# Patient Record
Sex: Male | Born: 1940
Health system: Southern US, Community
[De-identification: ages and names within clinical notes are randomized; demographics above are authoritative.]

## PROBLEM LIST (undated history)

## (undated) DIAGNOSIS — Z950 Presence of cardiac pacemaker: Secondary | ICD-10-CM

## (undated) DIAGNOSIS — T4145XA Adverse effect of unspecified anesthetic, initial encounter: Secondary | ICD-10-CM

## (undated) DIAGNOSIS — J40 Bronchitis, not specified as acute or chronic: Secondary | ICD-10-CM

## (undated) DIAGNOSIS — F32A Depression, unspecified: Secondary | ICD-10-CM

## (undated) DIAGNOSIS — T8859XA Other complications of anesthesia, initial encounter: Secondary | ICD-10-CM

## (undated) DIAGNOSIS — Z9889 Other specified postprocedural states: Secondary | ICD-10-CM

## (undated) DIAGNOSIS — F419 Anxiety disorder, unspecified: Secondary | ICD-10-CM

## (undated) DIAGNOSIS — E785 Hyperlipidemia, unspecified: Secondary | ICD-10-CM

## (undated) DIAGNOSIS — M1711 Unilateral primary osteoarthritis, right knee: Secondary | ICD-10-CM

## (undated) DIAGNOSIS — M719 Bursopathy, unspecified: Secondary | ICD-10-CM

## (undated) DIAGNOSIS — I10 Essential (primary) hypertension: Secondary | ICD-10-CM

## (undated) DIAGNOSIS — R011 Cardiac murmur, unspecified: Secondary | ICD-10-CM

## (undated) DIAGNOSIS — R112 Nausea with vomiting, unspecified: Secondary | ICD-10-CM

## (undated) DIAGNOSIS — I499 Cardiac arrhythmia, unspecified: Secondary | ICD-10-CM

## (undated) DIAGNOSIS — I480 Paroxysmal atrial fibrillation: Secondary | ICD-10-CM

## (undated) DIAGNOSIS — F329 Major depressive disorder, single episode, unspecified: Secondary | ICD-10-CM

## (undated) DIAGNOSIS — G43909 Migraine, unspecified, not intractable, without status migrainosus: Secondary | ICD-10-CM

## (undated) DIAGNOSIS — D649 Anemia, unspecified: Secondary | ICD-10-CM

## (undated) DIAGNOSIS — M1990 Unspecified osteoarthritis, unspecified site: Secondary | ICD-10-CM

## (undated) DIAGNOSIS — Z8489 Family history of other specified conditions: Secondary | ICD-10-CM

## (undated) HISTORY — PX: RHINOPLASTY: SUR1284

## (undated) HISTORY — DX: Unspecified osteoarthritis, unspecified site: M19.90

## (undated) HISTORY — PX: JOINT REPLACEMENT: SHX530

## (undated) HISTORY — DX: Essential (primary) hypertension: I10

## (undated) HISTORY — PX: OTHER SURGICAL HISTORY: SHX169

## (undated) HISTORY — DX: Migraine, unspecified, not intractable, without status migrainosus: G43.909

## (undated) HISTORY — PX: TONSILLECTOMY: SUR1361

## (undated) HISTORY — DX: Major depressive disorder, single episode, unspecified: F32.9

## (undated) HISTORY — DX: Bursopathy, unspecified: M71.9

## (undated) HISTORY — PX: TRANSTHORACIC ECHOCARDIOGRAM: SHX275

## (undated) HISTORY — DX: Depression, unspecified: F32.A

## (undated) HISTORY — PX: CHOLECYSTECTOMY: SHX55

## (undated) HISTORY — DX: Paroxysmal atrial fibrillation: I48.0

## (undated) HISTORY — DX: Bronchitis, not specified as acute or chronic: J40

## (undated) HISTORY — DX: Unilateral primary osteoarthritis, right knee: M17.11

## (undated) HISTORY — DX: Cardiac murmur, unspecified: R01.1

## (undated) HISTORY — DX: Hyperlipidemia, unspecified: E78.5

## (undated) HISTORY — PX: APPENDECTOMY: SHX54

---

## 2000-01-16 ENCOUNTER — Encounter: Payer: Self-pay | Admitting: General Surgery

## 2000-01-17 ENCOUNTER — Ambulatory Visit (HOSPITAL_COMMUNITY): Admission: RE | Admit: 2000-01-17 | Discharge: 2000-01-17 | Payer: Self-pay | Admitting: General Surgery

## 2000-05-05 ENCOUNTER — Ambulatory Visit (HOSPITAL_COMMUNITY): Admission: RE | Admit: 2000-05-05 | Discharge: 2000-05-05 | Payer: Self-pay | Admitting: Gastroenterology

## 2000-05-05 ENCOUNTER — Encounter (INDEPENDENT_AMBULATORY_CARE_PROVIDER_SITE_OTHER): Payer: Self-pay

## 2002-06-16 ENCOUNTER — Encounter: Admission: RE | Admit: 2002-06-16 | Discharge: 2002-09-14 | Payer: Self-pay | Admitting: Orthopedic Surgery

## 2002-10-17 ENCOUNTER — Ambulatory Visit (HOSPITAL_BASED_OUTPATIENT_CLINIC_OR_DEPARTMENT_OTHER): Admission: RE | Admit: 2002-10-17 | Discharge: 2002-10-17 | Payer: Self-pay | Admitting: *Deleted

## 2003-05-27 HISTORY — PX: CARDIAC CATHETERIZATION: SHX172

## 2003-07-17 ENCOUNTER — Encounter: Admission: RE | Admit: 2003-07-17 | Discharge: 2003-07-17 | Payer: Self-pay | Admitting: Gastroenterology

## 2003-08-28 ENCOUNTER — Observation Stay (HOSPITAL_COMMUNITY): Admission: RE | Admit: 2003-08-28 | Discharge: 2003-08-29 | Payer: Self-pay | Admitting: General Surgery

## 2003-08-28 ENCOUNTER — Encounter (INDEPENDENT_AMBULATORY_CARE_PROVIDER_SITE_OTHER): Payer: Self-pay | Admitting: Specialist

## 2004-03-20 ENCOUNTER — Inpatient Hospital Stay (HOSPITAL_BASED_OUTPATIENT_CLINIC_OR_DEPARTMENT_OTHER): Admission: RE | Admit: 2004-03-20 | Discharge: 2004-03-20 | Payer: Self-pay | Admitting: *Deleted

## 2004-03-28 ENCOUNTER — Ambulatory Visit: Payer: Self-pay | Admitting: Internal Medicine

## 2004-04-02 ENCOUNTER — Ambulatory Visit: Payer: Self-pay | Admitting: Cardiology

## 2004-04-16 ENCOUNTER — Ambulatory Visit: Payer: Self-pay | Admitting: Internal Medicine

## 2004-05-07 ENCOUNTER — Ambulatory Visit: Payer: Self-pay | Admitting: Cardiology

## 2004-05-07 ENCOUNTER — Ambulatory Visit: Payer: Self-pay | Admitting: Internal Medicine

## 2004-05-13 ENCOUNTER — Ambulatory Visit: Payer: Self-pay | Admitting: Cardiovascular Disease

## 2004-05-21 ENCOUNTER — Ambulatory Visit: Payer: Self-pay | Admitting: Internal Medicine

## 2004-05-24 ENCOUNTER — Ambulatory Visit: Payer: Self-pay | Admitting: Internal Medicine

## 2004-05-26 HISTORY — PX: OTHER SURGICAL HISTORY: SHX169

## 2004-06-14 ENCOUNTER — Ambulatory Visit: Payer: Self-pay | Admitting: Internal Medicine

## 2004-06-18 ENCOUNTER — Ambulatory Visit: Payer: Self-pay | Admitting: Internal Medicine

## 2004-06-21 ENCOUNTER — Ambulatory Visit: Payer: Self-pay | Admitting: Cardiology

## 2004-07-05 ENCOUNTER — Ambulatory Visit: Payer: Self-pay | Admitting: Cardiology

## 2004-07-08 ENCOUNTER — Ambulatory Visit: Payer: Self-pay | Admitting: Internal Medicine

## 2004-07-22 ENCOUNTER — Ambulatory Visit: Payer: Self-pay | Admitting: Internal Medicine

## 2004-08-12 ENCOUNTER — Ambulatory Visit: Payer: Self-pay | Admitting: *Deleted

## 2004-08-19 ENCOUNTER — Ambulatory Visit: Payer: Self-pay | Admitting: Cardiology

## 2004-08-26 ENCOUNTER — Ambulatory Visit: Payer: Self-pay | Admitting: Cardiology

## 2004-09-16 ENCOUNTER — Ambulatory Visit: Payer: Self-pay | Admitting: Cardiology

## 2004-10-14 ENCOUNTER — Ambulatory Visit: Payer: Self-pay | Admitting: Cardiology

## 2004-11-11 ENCOUNTER — Ambulatory Visit: Payer: Self-pay | Admitting: Cardiology

## 2004-12-09 ENCOUNTER — Ambulatory Visit: Payer: Self-pay | Admitting: Internal Medicine

## 2005-01-06 ENCOUNTER — Ambulatory Visit: Payer: Self-pay | Admitting: Cardiology

## 2005-02-03 ENCOUNTER — Ambulatory Visit: Payer: Self-pay | Admitting: Internal Medicine

## 2005-02-17 ENCOUNTER — Ambulatory Visit: Payer: Self-pay | Admitting: Internal Medicine

## 2005-03-14 ENCOUNTER — Ambulatory Visit: Payer: Self-pay | Admitting: Cardiovascular Disease

## 2005-04-12 ENCOUNTER — Encounter: Admission: RE | Admit: 2005-04-12 | Discharge: 2005-04-12 | Payer: Self-pay | Admitting: Orthopedic Surgery

## 2005-04-14 ENCOUNTER — Ambulatory Visit: Payer: Self-pay | Admitting: Cardiology

## 2005-04-30 ENCOUNTER — Ambulatory Visit: Payer: Self-pay | Admitting: Cardiology

## 2005-04-30 ENCOUNTER — Inpatient Hospital Stay (HOSPITAL_COMMUNITY): Admission: RE | Admit: 2005-04-30 | Discharge: 2005-05-01 | Payer: Self-pay | Admitting: Orthopedic Surgery

## 2005-05-02 ENCOUNTER — Emergency Department (HOSPITAL_COMMUNITY): Admission: EM | Admit: 2005-05-02 | Discharge: 2005-05-03 | Payer: Self-pay | Admitting: Emergency Medicine

## 2005-05-06 ENCOUNTER — Ambulatory Visit: Payer: Self-pay | Admitting: Internal Medicine

## 2005-05-06 ENCOUNTER — Ambulatory Visit: Payer: Self-pay | Admitting: Cardiology

## 2005-05-07 ENCOUNTER — Ambulatory Visit: Payer: Self-pay | Admitting: Internal Medicine

## 2005-05-13 ENCOUNTER — Ambulatory Visit: Payer: Self-pay

## 2005-05-13 ENCOUNTER — Ambulatory Visit: Payer: Self-pay | Admitting: Cardiovascular Disease

## 2005-05-13 ENCOUNTER — Ambulatory Visit: Payer: Self-pay | Admitting: Internal Medicine

## 2005-05-13 ENCOUNTER — Encounter: Payer: Self-pay | Admitting: Cardiology

## 2005-05-30 ENCOUNTER — Ambulatory Visit: Payer: Self-pay | Admitting: Internal Medicine

## 2005-05-30 ENCOUNTER — Ambulatory Visit: Payer: Self-pay | Admitting: Cardiology

## 2005-06-06 ENCOUNTER — Ambulatory Visit (HOSPITAL_COMMUNITY): Admission: RE | Admit: 2005-06-06 | Discharge: 2005-06-07 | Payer: Self-pay | Admitting: Internal Medicine

## 2005-06-06 ENCOUNTER — Ambulatory Visit: Payer: Self-pay | Admitting: Internal Medicine

## 2005-06-06 HISTORY — PX: PACEMAKER INSERTION: SHX728

## 2005-06-20 ENCOUNTER — Ambulatory Visit: Payer: Self-pay | Admitting: Cardiology

## 2005-06-23 ENCOUNTER — Ambulatory Visit: Payer: Self-pay

## 2005-06-30 ENCOUNTER — Ambulatory Visit: Payer: Self-pay | Admitting: Internal Medicine

## 2005-07-08 ENCOUNTER — Ambulatory Visit: Payer: Self-pay | Admitting: *Deleted

## 2005-07-29 ENCOUNTER — Ambulatory Visit: Payer: Self-pay | Admitting: *Deleted

## 2005-09-03 ENCOUNTER — Ambulatory Visit: Payer: Self-pay | Admitting: *Deleted

## 2005-09-17 ENCOUNTER — Ambulatory Visit: Payer: Self-pay | Admitting: *Deleted

## 2005-09-24 ENCOUNTER — Ambulatory Visit: Payer: Self-pay | Admitting: Internal Medicine

## 2005-10-01 ENCOUNTER — Ambulatory Visit: Payer: Self-pay | Admitting: Internal Medicine

## 2005-10-22 ENCOUNTER — Ambulatory Visit: Payer: Self-pay | Admitting: Internal Medicine

## 2005-11-10 ENCOUNTER — Ambulatory Visit: Payer: Self-pay | Admitting: Cardiology

## 2005-12-01 ENCOUNTER — Ambulatory Visit: Payer: Self-pay | Admitting: Internal Medicine

## 2005-12-29 ENCOUNTER — Ambulatory Visit: Payer: Self-pay | Admitting: Internal Medicine

## 2006-01-14 ENCOUNTER — Ambulatory Visit: Payer: Self-pay | Admitting: Cardiology

## 2006-01-28 ENCOUNTER — Ambulatory Visit: Payer: Self-pay | Admitting: *Deleted

## 2006-02-11 ENCOUNTER — Ambulatory Visit: Payer: Self-pay | Admitting: Cardiology

## 2006-02-25 ENCOUNTER — Ambulatory Visit: Payer: Self-pay | Admitting: Internal Medicine

## 2006-03-09 ENCOUNTER — Ambulatory Visit: Payer: Self-pay | Admitting: Cardiology

## 2006-04-01 ENCOUNTER — Ambulatory Visit: Payer: Self-pay | Admitting: Cardiology

## 2006-04-24 ENCOUNTER — Ambulatory Visit: Payer: Self-pay | Admitting: Cardiology

## 2006-05-22 ENCOUNTER — Ambulatory Visit: Payer: Self-pay | Admitting: Cardiology

## 2006-06-09 ENCOUNTER — Ambulatory Visit: Payer: Self-pay | Admitting: Internal Medicine

## 2006-06-19 ENCOUNTER — Ambulatory Visit: Payer: Self-pay | Admitting: Internal Medicine

## 2006-06-25 ENCOUNTER — Ambulatory Visit: Payer: Self-pay | Admitting: Cardiology

## 2006-07-06 ENCOUNTER — Ambulatory Visit: Payer: Self-pay | Admitting: Cardiology

## 2006-07-27 ENCOUNTER — Ambulatory Visit: Payer: Self-pay | Admitting: Cardiovascular Disease

## 2006-08-24 ENCOUNTER — Ambulatory Visit: Payer: Self-pay | Admitting: Cardiovascular Disease

## 2006-09-08 ENCOUNTER — Ambulatory Visit: Payer: Self-pay | Admitting: Cardiovascular Disease

## 2006-09-15 ENCOUNTER — Ambulatory Visit: Payer: Self-pay | Admitting: Internal Medicine

## 2006-09-18 ENCOUNTER — Encounter: Admission: RE | Admit: 2006-09-18 | Discharge: 2006-09-18 | Payer: Self-pay | Admitting: Orthopedic Surgery

## 2006-09-18 ENCOUNTER — Ambulatory Visit: Payer: Self-pay | Admitting: Cardiology

## 2006-09-28 ENCOUNTER — Ambulatory Visit: Payer: Self-pay | Admitting: Cardiovascular Disease

## 2006-10-27 ENCOUNTER — Ambulatory Visit: Payer: Self-pay | Admitting: Cardiology

## 2006-11-10 ENCOUNTER — Ambulatory Visit: Payer: Self-pay | Admitting: Cardiology

## 2006-12-08 ENCOUNTER — Ambulatory Visit: Payer: Self-pay | Admitting: Cardiovascular Disease

## 2006-12-11 ENCOUNTER — Ambulatory Visit: Payer: Self-pay | Admitting: Internal Medicine

## 2006-12-22 ENCOUNTER — Ambulatory Visit: Payer: Self-pay | Admitting: Cardiology

## 2007-01-11 ENCOUNTER — Ambulatory Visit: Payer: Self-pay | Admitting: Cardiology

## 2007-02-02 ENCOUNTER — Ambulatory Visit: Payer: Self-pay | Admitting: Internal Medicine

## 2007-02-08 ENCOUNTER — Ambulatory Visit: Payer: Self-pay | Admitting: Internal Medicine

## 2007-03-08 ENCOUNTER — Ambulatory Visit: Payer: Self-pay | Admitting: Internal Medicine

## 2007-03-25 ENCOUNTER — Ambulatory Visit: Payer: Self-pay | Admitting: Internal Medicine

## 2007-04-15 ENCOUNTER — Ambulatory Visit: Payer: Self-pay | Admitting: Cardiology

## 2007-05-24 ENCOUNTER — Ambulatory Visit: Payer: Self-pay | Admitting: Cardiology

## 2007-06-01 ENCOUNTER — Ambulatory Visit: Payer: Self-pay | Admitting: Cardiology

## 2007-06-02 ENCOUNTER — Encounter: Admission: RE | Admit: 2007-06-02 | Discharge: 2007-06-02 | Payer: Self-pay | Admitting: Orthopedic Surgery

## 2007-06-02 ENCOUNTER — Ambulatory Visit: Payer: Self-pay | Admitting: Cardiovascular Disease

## 2007-06-17 ENCOUNTER — Ambulatory Visit: Payer: Self-pay | Admitting: Cardiology

## 2007-06-21 ENCOUNTER — Ambulatory Visit: Payer: Self-pay | Admitting: Internal Medicine

## 2007-07-15 ENCOUNTER — Ambulatory Visit: Payer: Self-pay | Admitting: Cardiology

## 2007-07-29 ENCOUNTER — Ambulatory Visit: Payer: Self-pay | Admitting: Internal Medicine

## 2007-08-03 ENCOUNTER — Ambulatory Visit: Payer: Self-pay | Admitting: Cardiovascular Disease

## 2007-08-12 ENCOUNTER — Ambulatory Visit: Payer: Self-pay | Admitting: Cardiology

## 2007-09-09 ENCOUNTER — Ambulatory Visit: Payer: Self-pay | Admitting: Cardiology

## 2007-10-07 ENCOUNTER — Ambulatory Visit: Payer: Self-pay | Admitting: Internal Medicine

## 2007-11-04 ENCOUNTER — Ambulatory Visit: Payer: Self-pay | Admitting: Cardiology

## 2007-11-08 ENCOUNTER — Ambulatory Visit: Payer: Self-pay | Admitting: Internal Medicine

## 2007-11-18 ENCOUNTER — Ambulatory Visit: Payer: Self-pay | Admitting: Cardiology

## 2007-11-23 ENCOUNTER — Ambulatory Visit: Payer: Self-pay | Admitting: Internal Medicine

## 2007-11-23 LAB — CONVERTED CEMR LAB
Basophils Absolute: 0 10*3/uL (ref 0.0–0.1)
CO2: 31 meq/L (ref 19–32)
Chloride: 103 meq/L (ref 96–112)
Creatinine, Ser: 1 mg/dL (ref 0.4–1.5)
Eosinophils Absolute: 0.1 10*3/uL (ref 0.0–0.7)
GFR calc non Af Amer: 79 mL/min
Lymphocytes Relative: 18.1 % (ref 12.0–46.0)
MCHC: 35.1 g/dL (ref 30.0–36.0)
MCV: 93.2 fL (ref 78.0–100.0)
Magnesium: 2.6 mg/dL — ABNORMAL HIGH (ref 1.5–2.5)
Neutrophils Relative %: 72.3 % (ref 43.0–77.0)
Platelets: 265 10*3/uL (ref 150–400)
Potassium: 3.2 meq/L — ABNORMAL LOW (ref 3.5–5.1)
Prothrombin Time: 31.4 s — ABNORMAL HIGH (ref 10.9–13.3)
RBC: 4.64 M/uL (ref 4.22–5.81)
RDW: 13 % (ref 11.5–14.6)
Sodium: 141 meq/L (ref 135–145)
aPTT: 49.4 s — ABNORMAL HIGH (ref 21.7–29.8)

## 2007-11-24 ENCOUNTER — Inpatient Hospital Stay (HOSPITAL_COMMUNITY): Admission: AD | Admit: 2007-11-24 | Discharge: 2007-11-27 | Payer: Self-pay | Admitting: Internal Medicine

## 2007-11-24 ENCOUNTER — Ambulatory Visit: Payer: Self-pay | Admitting: Internal Medicine

## 2007-12-16 ENCOUNTER — Ambulatory Visit: Payer: Self-pay | Admitting: Internal Medicine

## 2007-12-31 ENCOUNTER — Ambulatory Visit: Payer: Self-pay | Admitting: Cardiology

## 2008-01-14 ENCOUNTER — Ambulatory Visit: Payer: Self-pay | Admitting: Internal Medicine

## 2008-02-04 ENCOUNTER — Ambulatory Visit: Payer: Self-pay | Admitting: Internal Medicine

## 2008-02-18 ENCOUNTER — Ambulatory Visit: Payer: Self-pay | Admitting: Internal Medicine

## 2008-03-17 ENCOUNTER — Ambulatory Visit: Payer: Self-pay | Admitting: Cardiology

## 2008-03-21 ENCOUNTER — Ambulatory Visit: Payer: Self-pay | Admitting: Internal Medicine

## 2008-04-05 ENCOUNTER — Ambulatory Visit: Payer: Self-pay | Admitting: Internal Medicine

## 2008-04-18 ENCOUNTER — Ambulatory Visit: Payer: Self-pay

## 2008-04-18 ENCOUNTER — Encounter: Payer: Self-pay | Admitting: Internal Medicine

## 2008-04-18 ENCOUNTER — Ambulatory Visit: Payer: Self-pay | Admitting: Cardiovascular Disease

## 2008-04-19 DIAGNOSIS — I4891 Unspecified atrial fibrillation: Secondary | ICD-10-CM | POA: Insufficient documentation

## 2008-04-19 DIAGNOSIS — I10 Essential (primary) hypertension: Secondary | ICD-10-CM | POA: Insufficient documentation

## 2008-04-24 ENCOUNTER — Ambulatory Visit: Payer: Self-pay | Admitting: Internal Medicine

## 2008-04-24 LAB — CONVERTED CEMR LAB
BUN: 16 mg/dL (ref 6–23)
Chloride: 106 meq/L (ref 96–112)
Eosinophils Absolute: 0.2 10*3/uL (ref 0.0–0.7)
Eosinophils Relative: 3.7 % (ref 0.0–5.0)
GFR calc Af Amer: 124 mL/min
GFR calc non Af Amer: 102 mL/min
HCT: 40.3 % (ref 39.0–52.0)
INR: 2.6 — ABNORMAL HIGH (ref 0.8–1.0)
MCV: 88.6 fL (ref 78.0–100.0)
Monocytes Absolute: 0.5 10*3/uL (ref 0.1–1.0)
Monocytes Relative: 9 % (ref 3.0–12.0)
Neutrophils Relative %: 55.6 % (ref 43.0–77.0)
Platelets: 187 10*3/uL (ref 150–400)
Potassium: 4.4 meq/L (ref 3.5–5.1)
RDW: 13.6 % (ref 11.5–14.6)
Sodium: 139 meq/L (ref 135–145)
TSH: 1.13 microintl units/mL (ref 0.35–5.50)
WBC: 5.1 10*3/uL (ref 4.5–10.5)

## 2008-04-25 HISTORY — PX: TRANSESOPHAGEAL ECHOCARDIOGRAM: SHX273

## 2008-05-01 ENCOUNTER — Encounter: Payer: Self-pay | Admitting: Internal Medicine

## 2008-05-01 ENCOUNTER — Ambulatory Visit: Payer: Self-pay | Admitting: Internal Medicine

## 2008-05-01 ENCOUNTER — Ambulatory Visit (HOSPITAL_COMMUNITY): Admission: RE | Admit: 2008-05-01 | Discharge: 2008-05-01 | Payer: Self-pay | Admitting: Internal Medicine

## 2008-05-02 ENCOUNTER — Ambulatory Visit: Payer: Self-pay | Admitting: Internal Medicine

## 2008-05-02 ENCOUNTER — Inpatient Hospital Stay (HOSPITAL_COMMUNITY): Admission: RE | Admit: 2008-05-02 | Discharge: 2008-05-03 | Payer: Self-pay | Admitting: Internal Medicine

## 2008-05-05 ENCOUNTER — Ambulatory Visit: Payer: Self-pay | Admitting: Cardiovascular Disease

## 2008-06-02 ENCOUNTER — Ambulatory Visit: Payer: Self-pay | Admitting: Cardiology

## 2008-06-10 ENCOUNTER — Emergency Department (HOSPITAL_COMMUNITY): Admission: EM | Admit: 2008-06-10 | Discharge: 2008-06-10 | Payer: Self-pay | Admitting: Emergency Medicine

## 2008-06-26 ENCOUNTER — Ambulatory Visit: Payer: Self-pay | Admitting: Internal Medicine

## 2008-06-26 ENCOUNTER — Ambulatory Visit: Payer: Self-pay | Admitting: Cardiology

## 2008-06-26 ENCOUNTER — Encounter (INDEPENDENT_AMBULATORY_CARE_PROVIDER_SITE_OTHER): Payer: Self-pay | Admitting: *Deleted

## 2008-07-06 ENCOUNTER — Encounter: Admission: RE | Admit: 2008-07-06 | Discharge: 2008-07-06 | Payer: Self-pay | Admitting: Neurosurgery

## 2008-07-17 ENCOUNTER — Ambulatory Visit: Payer: Self-pay | Admitting: Cardiology

## 2008-07-24 ENCOUNTER — Ambulatory Visit: Payer: Self-pay | Admitting: Cardiology

## 2008-08-14 ENCOUNTER — Ambulatory Visit: Payer: Self-pay | Admitting: Cardiology

## 2008-09-04 ENCOUNTER — Ambulatory Visit: Payer: Self-pay | Admitting: Internal Medicine

## 2008-09-25 ENCOUNTER — Ambulatory Visit: Payer: Self-pay | Admitting: Cardiology

## 2008-09-27 ENCOUNTER — Ambulatory Visit: Payer: Self-pay | Admitting: Internal Medicine

## 2008-10-09 ENCOUNTER — Telehealth: Payer: Self-pay | Admitting: Internal Medicine

## 2008-10-16 ENCOUNTER — Ambulatory Visit: Payer: Self-pay | Admitting: Cardiovascular Disease

## 2008-10-20 ENCOUNTER — Encounter: Payer: Self-pay | Admitting: Internal Medicine

## 2008-10-24 ENCOUNTER — Encounter: Payer: Self-pay | Admitting: *Deleted

## 2008-10-26 ENCOUNTER — Ambulatory Visit: Payer: Self-pay | Admitting: Internal Medicine

## 2008-10-26 ENCOUNTER — Encounter (INDEPENDENT_AMBULATORY_CARE_PROVIDER_SITE_OTHER): Payer: Self-pay | Admitting: Cardiology

## 2008-10-26 LAB — CONVERTED CEMR LAB
POC INR: 1.8
Protime: 16.4

## 2008-11-16 ENCOUNTER — Ambulatory Visit: Payer: Self-pay | Admitting: Cardiology

## 2008-11-29 ENCOUNTER — Encounter: Payer: Self-pay | Admitting: *Deleted

## 2008-12-05 ENCOUNTER — Encounter (INDEPENDENT_AMBULATORY_CARE_PROVIDER_SITE_OTHER): Payer: Self-pay | Admitting: Cardiology

## 2008-12-05 ENCOUNTER — Ambulatory Visit: Payer: Self-pay | Admitting: Cardiology

## 2008-12-28 ENCOUNTER — Encounter: Payer: Self-pay | Admitting: Internal Medicine

## 2009-01-02 ENCOUNTER — Ambulatory Visit: Payer: Self-pay | Admitting: Internal Medicine

## 2009-01-02 LAB — CONVERTED CEMR LAB: POC INR: 1.6

## 2009-01-03 ENCOUNTER — Ambulatory Visit: Payer: Self-pay | Admitting: Internal Medicine

## 2009-01-05 ENCOUNTER — Encounter: Payer: Self-pay | Admitting: Internal Medicine

## 2009-01-16 ENCOUNTER — Encounter: Payer: Self-pay | Admitting: Internal Medicine

## 2009-01-23 ENCOUNTER — Ambulatory Visit: Payer: Self-pay | Admitting: Cardiology

## 2009-01-23 LAB — CONVERTED CEMR LAB: POC INR: 2.1

## 2009-02-12 ENCOUNTER — Telehealth: Payer: Self-pay | Admitting: Internal Medicine

## 2009-02-20 ENCOUNTER — Ambulatory Visit: Payer: Self-pay | Admitting: Cardiology

## 2009-02-20 LAB — CONVERTED CEMR LAB: POC INR: 2

## 2009-02-26 ENCOUNTER — Ambulatory Visit: Payer: Self-pay | Admitting: Internal Medicine

## 2009-02-26 ENCOUNTER — Encounter: Payer: Self-pay | Admitting: Internal Medicine

## 2009-02-26 ENCOUNTER — Encounter: Admission: RE | Admit: 2009-02-26 | Discharge: 2009-02-26 | Payer: Self-pay | Admitting: Orthopedic Surgery

## 2009-02-26 LAB — CONVERTED CEMR LAB: POC INR: 1.4

## 2009-03-01 ENCOUNTER — Ambulatory Visit: Payer: Self-pay | Admitting: Internal Medicine

## 2009-03-06 ENCOUNTER — Ambulatory Visit: Payer: Self-pay | Admitting: Cardiology

## 2009-03-06 ENCOUNTER — Encounter: Payer: Self-pay | Admitting: Internal Medicine

## 2009-03-06 LAB — CONVERTED CEMR LAB: POC INR: 1.9

## 2009-03-12 ENCOUNTER — Telehealth: Payer: Self-pay | Admitting: Internal Medicine

## 2009-03-16 ENCOUNTER — Ambulatory Visit: Payer: Self-pay | Admitting: Cardiology

## 2009-03-29 ENCOUNTER — Inpatient Hospital Stay (HOSPITAL_COMMUNITY): Admission: RE | Admit: 2009-03-29 | Discharge: 2009-03-30 | Payer: Self-pay | Admitting: Orthopedic Surgery

## 2009-03-29 ENCOUNTER — Ambulatory Visit: Payer: Self-pay | Admitting: Internal Medicine

## 2009-03-30 ENCOUNTER — Encounter: Payer: Self-pay | Admitting: Internal Medicine

## 2009-04-04 ENCOUNTER — Ambulatory Visit: Payer: Self-pay | Admitting: Internal Medicine

## 2009-04-04 LAB — CONVERTED CEMR LAB: POC INR: 1.5

## 2009-04-11 ENCOUNTER — Ambulatory Visit: Payer: Self-pay | Admitting: Cardiology

## 2009-04-11 ENCOUNTER — Ambulatory Visit: Payer: Self-pay | Admitting: Internal Medicine

## 2009-04-11 DIAGNOSIS — R51 Headache: Secondary | ICD-10-CM | POA: Insufficient documentation

## 2009-04-11 DIAGNOSIS — R519 Headache, unspecified: Secondary | ICD-10-CM | POA: Insufficient documentation

## 2009-04-11 LAB — CONVERTED CEMR LAB: POC INR: 1.7

## 2009-04-25 ENCOUNTER — Ambulatory Visit: Payer: Self-pay | Admitting: Cardiology

## 2009-05-16 ENCOUNTER — Ambulatory Visit: Payer: Self-pay | Admitting: Internal Medicine

## 2009-05-16 LAB — CONVERTED CEMR LAB
INR: 2.2
POC INR: 2.2

## 2009-06-05 ENCOUNTER — Ambulatory Visit: Payer: Self-pay | Admitting: Internal Medicine

## 2009-06-05 DIAGNOSIS — Z950 Presence of cardiac pacemaker: Secondary | ICD-10-CM | POA: Insufficient documentation

## 2009-06-13 ENCOUNTER — Ambulatory Visit: Payer: Self-pay | Admitting: Cardiovascular Disease

## 2009-06-28 ENCOUNTER — Ambulatory Visit: Payer: Self-pay | Admitting: Cardiology

## 2009-07-19 ENCOUNTER — Ambulatory Visit: Payer: Self-pay | Admitting: Cardiology

## 2009-07-24 ENCOUNTER — Encounter: Admission: RE | Admit: 2009-07-24 | Discharge: 2009-07-24 | Payer: Self-pay | Admitting: Neurosurgery

## 2009-07-24 ENCOUNTER — Ambulatory Visit: Payer: Self-pay | Admitting: Cardiovascular Disease

## 2009-07-24 LAB — CONVERTED CEMR LAB: POC INR: 1.4

## 2009-08-03 ENCOUNTER — Ambulatory Visit: Payer: Self-pay | Admitting: Cardiology

## 2009-08-17 ENCOUNTER — Ambulatory Visit: Payer: Self-pay | Admitting: Internal Medicine

## 2009-08-17 LAB — CONVERTED CEMR LAB: POC INR: 2.3

## 2009-09-04 ENCOUNTER — Encounter: Payer: Self-pay | Admitting: Internal Medicine

## 2009-09-04 ENCOUNTER — Ambulatory Visit: Payer: Self-pay | Admitting: Internal Medicine

## 2009-09-10 ENCOUNTER — Ambulatory Visit: Payer: Self-pay | Admitting: Cardiology

## 2009-09-12 ENCOUNTER — Encounter: Payer: Self-pay | Admitting: Internal Medicine

## 2009-10-01 ENCOUNTER — Ambulatory Visit: Payer: Self-pay | Admitting: Internal Medicine

## 2009-10-29 ENCOUNTER — Ambulatory Visit: Payer: Self-pay | Admitting: Cardiology

## 2009-10-29 LAB — CONVERTED CEMR LAB: POC INR: 2.2

## 2009-11-20 ENCOUNTER — Telehealth: Payer: Self-pay | Admitting: Internal Medicine

## 2009-11-23 ENCOUNTER — Ambulatory Visit: Payer: Self-pay | Admitting: Cardiology

## 2009-11-23 ENCOUNTER — Encounter: Admission: RE | Admit: 2009-11-23 | Discharge: 2009-11-23 | Payer: Self-pay | Admitting: Orthopedic Surgery

## 2009-11-30 ENCOUNTER — Ambulatory Visit: Payer: Self-pay | Admitting: Cardiology

## 2009-12-12 ENCOUNTER — Ambulatory Visit: Payer: Self-pay | Admitting: Cardiology

## 2009-12-12 ENCOUNTER — Ambulatory Visit: Payer: Self-pay | Admitting: Internal Medicine

## 2009-12-17 ENCOUNTER — Telehealth: Payer: Self-pay | Admitting: Internal Medicine

## 2009-12-21 ENCOUNTER — Ambulatory Visit: Payer: Self-pay | Admitting: Cardiology

## 2009-12-21 LAB — CONVERTED CEMR LAB: POC INR: 3.1

## 2009-12-24 ENCOUNTER — Ambulatory Visit: Payer: Self-pay | Admitting: Cardiovascular Disease

## 2009-12-24 LAB — CONVERTED CEMR LAB: POC INR: 1.2

## 2009-12-27 ENCOUNTER — Encounter: Payer: Self-pay | Admitting: Internal Medicine

## 2010-01-02 ENCOUNTER — Ambulatory Visit: Payer: Self-pay | Admitting: Internal Medicine

## 2010-01-02 LAB — CONVERTED CEMR LAB
Basophils Relative: 0.7 % (ref 0.0–3.0)
Eosinophils Relative: 4.4 % (ref 0.0–5.0)
Hemoglobin: 13.3 g/dL (ref 13.0–17.0)
Lymphocytes Relative: 24.2 % (ref 12.0–46.0)
Monocytes Relative: 8.1 % (ref 3.0–12.0)
Neutro Abs: 3.5 10*3/uL (ref 1.4–7.7)
RBC: 4.19 M/uL — ABNORMAL LOW (ref 4.22–5.81)

## 2010-01-16 ENCOUNTER — Encounter: Payer: Self-pay | Admitting: Internal Medicine

## 2010-01-16 ENCOUNTER — Telehealth: Payer: Self-pay | Admitting: Internal Medicine

## 2010-01-22 ENCOUNTER — Telehealth: Payer: Self-pay | Admitting: Internal Medicine

## 2010-01-22 ENCOUNTER — Encounter: Payer: Self-pay | Admitting: Internal Medicine

## 2010-01-25 ENCOUNTER — Ambulatory Visit (HOSPITAL_COMMUNITY): Admission: RE | Admit: 2010-01-25 | Discharge: 2010-01-25 | Payer: Self-pay | Admitting: Orthopedic Surgery

## 2010-03-14 ENCOUNTER — Ambulatory Visit: Payer: Self-pay | Admitting: Internal Medicine

## 2010-03-29 ENCOUNTER — Ambulatory Visit: Payer: Self-pay | Admitting: Cardiology

## 2010-03-29 ENCOUNTER — Encounter (INDEPENDENT_AMBULATORY_CARE_PROVIDER_SITE_OTHER): Payer: Self-pay | Admitting: *Deleted

## 2010-04-05 ENCOUNTER — Ambulatory Visit: Payer: Self-pay | Admitting: Cardiology

## 2010-04-12 ENCOUNTER — Ambulatory Visit: Payer: Self-pay | Admitting: Cardiovascular Disease

## 2010-04-12 LAB — CONVERTED CEMR LAB: POC INR: 2.5

## 2010-04-22 ENCOUNTER — Ambulatory Visit: Payer: Self-pay | Admitting: Internal Medicine

## 2010-04-22 LAB — CONVERTED CEMR LAB: POC INR: 2.8

## 2010-05-10 ENCOUNTER — Telehealth: Payer: Self-pay | Admitting: Internal Medicine

## 2010-05-10 ENCOUNTER — Ambulatory Visit: Payer: Self-pay | Admitting: Cardiology

## 2010-05-10 ENCOUNTER — Encounter: Payer: Self-pay | Admitting: Internal Medicine

## 2010-05-22 ENCOUNTER — Inpatient Hospital Stay (HOSPITAL_COMMUNITY)
Admission: RE | Admit: 2010-05-22 | Discharge: 2010-05-26 | Payer: Self-pay | Source: Home / Self Care | Attending: Orthopedic Surgery | Admitting: Orthopedic Surgery

## 2010-05-31 ENCOUNTER — Telehealth: Payer: Self-pay | Admitting: Cardiology

## 2010-06-08 ENCOUNTER — Emergency Department (HOSPITAL_COMMUNITY)
Admission: EM | Admit: 2010-06-08 | Discharge: 2010-06-08 | Payer: Self-pay | Source: Home / Self Care | Admitting: Emergency Medicine

## 2010-06-11 ENCOUNTER — Encounter: Payer: Self-pay | Admitting: Internal Medicine

## 2010-06-11 ENCOUNTER — Ambulatory Visit
Admission: RE | Admit: 2010-06-11 | Discharge: 2010-06-11 | Payer: Self-pay | Source: Home / Self Care | Attending: Internal Medicine | Admitting: Internal Medicine

## 2010-06-16 ENCOUNTER — Encounter: Payer: Self-pay | Admitting: Orthopedic Surgery

## 2010-06-17 NOTE — Discharge Summary (Signed)
NAMEMENG, WINTERTON               ACCOUNT NO.:  [id number]  MEDICAL RECORD NO.:  [id number]         PATIENT TYPE:  LINP  LOCATION:                               FACILITY:  [facility]  PHYSICIAN:  Georges Lynch. Zakar Brosch, M.D.DATE OF BIRTH:  06/08/1940  DATE OF ADMISSION:  05/22/2010 DATE OF DISCHARGE:  05/26/2010                              DISCHARGE SUMMARY   ADMITTING DIAGNOSES: 1. End-stage arthritis of the left knee. 2. Depression. 3. Tinnitus. 4. History of bronchitis. 5. Atrial fibrillation. 6. Pacemaker. 7. Heart murmur. 8. History of arthritis. 9. History of bursitis. 10.History of gout.  DISCHARGE DIAGNOSES: 1. End-stage arthritis of the left knee, status post left total knee     arthroplasty. 2. Depression. 3. Tinnitus. 4. History of bronchitis. 5. Atrial fibrillation. 6. Pacemaker. 7. Heart murmur. 8. History of arthritis. 9. History of bursitis. 10.History of gout.  PROCEDURE:  On May 22, 2010, Mr. Jue was taken to the operating room by surgeon, Dr. Ranee Gosselin and assistant, Dr. Marlowe Kays. He underwent left total knee arthroplasty.  The procedure was performed under spinal anesthesia.  Routine orthopedic prep and drape were carried out.  The patient was given IV antibiotics before and during surgery. There are no complications with the procedure and the patient was returned to the recovery room in satisfactory condition.  HOSPITAL COURSE:  On May 22, 2010, Mr. Bresee was admitted to [facility].  He underwent the above procedure without complication.  Following adequate time in the recovery room, he was then taken to the floor.  He was given PCA, analgesic and muscle relaxant for pain control.  He was also restarted on his Coumadin that evening.  On postoperative day #1, the patient's PCA was discontinued.  The patient does have known atrial fibrillation.  Cardiology was called for consult. The patient was visited by Physical  Therapy on postoperative day #1.  He was able to ambulate 38 feet using a walker with minimal-to-moderate assistance.  Postoperative day #2, the patient was transferred to Telemetry because of the history of atrial fibrillation and having increased tachycardia following his surgery.  The patient continued to progress with physical therapy.  Postoperative day #2, he was able to ambulate 122 feet using a rolling walker with minimal assistance. Postoperative day #3, however, he was unable to complete his morning physical therapy session secondary to pain.  In the afternoon session, he was able to ambulate and work on transferring.  Postoperative day #4, the patient was discharged home with home health arrangements made.  LABORATORY DATA:  Preoperative labs revealed CBC with a white count of 5.9, hemoglobin 13.5, hematocrit 39.9, and a platelet count of 169. Preoperative INR is 1.74.  Please note the patient was on Coumadin preoperatively, but discontinued it 5 days before surgery.  Preoperative chemistry panel was unremarkable as was his preoperative urinalysis and preoperative PCRs for MRSA and Staph were both negative.  Postoperative day #1, the patient's hemoglobin had dropped to 11.  His INR had reached 1.22.  Postoperative day #2, INR dropped further to 9 with his INR reaching 1.46.  Postoperative day #3, hemoglobin dropped  to a low of 8.3.  It was rechecked later in the day and had reached 8.4.  His INR was at 1.47 on postoperative day #4, hemoglobin was 8.3.  He did not require blood transfusion throughout his hospital stay and his INR had reached 1.85.  MEDICATIONS ON DISCHARGE: 1. Coumadin, which will be dosed per Home Health.  INR needs to be     kept between 2 and 3 and again, he is on Coumadin at all times; so     once the Home Health is out, he will resume care with his     cardiologist for Coumadin instructions. 2. Xanax. 3. Inderal. 4. Advil, which he will discontinue  right now while he is on the     Coumadin. 5. Hydrocortisone. 6. Bengay cream. 7. Prozac. 8. Multaq. 9. Potassium chloride. 10.Percocet 11.Robaxin.  ACTIVITY:  The patient will increase activity slowly.  He should use a walker.  He is able to shower.  He should be mindful to not submerge the wound.  Physical Therapy will come out to the house, work with him on strengthening and range of motion, wound care, daily dressing change.  DIET:  No restrictions.  FOLLOWUP:  He will follow up with Dr. Darrelyn Hillock in 2 weeks from the day of surgery.  DISPOSITION:  To home on June 05, 2009.  CONDITION ON DISCHARGE:  Improving.     Rozell Searing, PAC   ______________________________ Georges Lynch Darrelyn Hillock, M.D.    LD/MEDQ  D:  06/10/2010  T:  06/10/2010  Job:  [id number]  Electronically Signed by Rozell Searing  on 06/12/2010 04:45:46 PM Electronically Signed by Ranee Gosselin M.D. on 06/17/2010 11:50:19 AM

## 2010-06-25 NOTE — Medication Information (Signed)
Summary: [name] [name]  Anticoagulant Therapy  Managed by: [name], RN, BSN Referring MD: Sharrell Ku PCP: Blair Heys, MD Supervising MD: Tenny Craw MD, Gunnar Fusi Indication 1: Atrial Fibrillation (ICD-427.31) Lab Used: [facility] Site: [facility] INR POC 2.3 INR RANGE 2 - 3  Dietary changes: no    Health status changes: no    Bleeding/hemorrhagic complications: no    Recent/future hospitalizations: no    Any changes in medication regimen? no    Recent/future dental: no  Any missed doses?: yes     Details: Pt has been taking 1/2 tablet daily.  Is patient compliant with meds? yes       Allergies: 1)  ! * Contrast Dye 2)  Demerol (Meperidine Hcl)  Anticoagulation Management History:      The patient is taking warfarin and comes in today for a routine follow up visit.  Positive risk factors for bleeding include an age of 72 years or older.  The bleeding index is 'intermediate risk'.  Positive CHADS2 values include History of HTN.  Negative CHADS2 values include Age > 47 years old.  The start date was 09/24/2003.  His last INR was 2.2.  Anticoagulation responsible provider: Tenny Craw MD, Gunnar Fusi.  INR POC: 2.3.  Cuvette Lot#: [id number].  Exp: 09/2010.    Anticoagulation Management Assessment/Plan:      The patient's current anticoagulation dose is Coumadin 5 mg tabs: Take as directed by coumadin clinic..  The target INR is 2 - 3.  The next INR is due 09/10/2009.  Anticoagulation instructions were given to patient.  Results were reviewed/authorized by [name], RN, BSN.  He was notified by [name] RN.         Prior Anticoagulation Instructions: INR 3.8  Do NOT take coumadin tomorrow (Saturday).  Then return to normal dosing schedule.  Return to clinic in 2 weeks.   Current Anticoagulation Instructions: INR 2.3  Continue on same dosage 1/2 tablet daily.  Recheck in 3 weeks.

## 2010-06-25 NOTE — Medication Information (Signed)
Summary: rov/sl  Anticoagulant Therapy  Managed by: Lyna Poser, PharmD Referring MD: Sharrell Ku PCP: Blair Heys, MD Supervising MD: Antoine Poche MD,Tia Gelb Indication 1: Atrial Fibrillation (ICD-427.31) Lab Used: [facility] Site: [facility] INR POC 2.5 INR RANGE 2 - 3  Dietary changes: no    Health status changes: no    Bleeding/hemorrhagic complications: no    Recent/future hospitalizations: yes       Details: knee replacement in december  Any changes in medication regimen? no    Recent/future dental: no  Any missed doses?: yes     Details: only took a half on monday instead of a full tablet.  Is patient compliant with meds? yes       Allergies: 1)  ! * Contrast Dye 2)  Demerol (Meperidine Hcl)  Anticoagulation Management History:      The patient is taking warfarin and comes in today for a routine follow up visit.  Positive risk factors for bleeding include an age of 72 years or older.  The bleeding index is 'intermediate risk'.  Positive CHADS2 values include History of HTN.  Negative CHADS2 values include Age > 62 years old.  The start date was 09/24/2003.  His last INR was 2.2.  Anticoagulation responsible provider: Haley Fuerstenberg MD,Tykia Mellone.  INR POC: 2.5.  Cuvette Lot#: [id number].  Exp: 02/2011.    Anticoagulation Management Assessment/Plan:      The patient's current anticoagulation dose is Warfarin sodium 5 mg tabs: Use as directed by anticoagulation clinic..  The target INR is 2 - 3.  The next INR is due 04/22/2010.  Anticoagulation instructions were given to patient.  Results were reviewed/authorized by Lyna Poser, PharmD.         Prior Anticoagulation Instructions: INR 2.7  Continue taking Coumadin 0.5 tab (2.5 mg) on all days except Coumadin 1 tab (5 mg) on Mondays and Saturdays. Return to clinic in 1 week.   Current Anticoagulation Instructions: INR 2.5 Continue taking 1 tablet on monday and saturday. And a half tablet all other days. Recheck in 1 week.

## 2010-06-25 NOTE — Medication Information (Signed)
Summary: rov/tm  Anticoagulant Therapy  Managed by: Weston Brass, PharmD Referring MD: Sharrell Ku PCP: Blair Heys, MD Supervising MD: Myrtis Ser MD, Tinnie Gens Indication 1: Atrial Fibrillation (ICD-427.31) Lab Used: [facility] Site: [facility] INR POC 1.4 INR RANGE 2 - 3  Dietary changes: no    Health status changes: no    Bleeding/hemorrhagic complications: no    Recent/future hospitalizations: no    Any changes in medication regimen? yes       Details: taking IBU and percocet for pain- discussed need to limit IBU to prevent GI bleeds if possible.   Recent/future dental: no  Any missed doses?: no       Is patient compliant with meds? yes       Allergies: 1)  ! * Contrast Dye 2)  Demerol (Meperidine Hcl)  Anticoagulation Management History:      The patient is taking warfarin and comes in today for a routine follow up visit.  Positive risk factors for bleeding include an age of 72 years or older.  The bleeding index is 'intermediate risk'.  Positive CHADS2 values include History of HTN.  Negative CHADS2 values include Age > 11 years old.  The start date was 09/24/2003.  His last INR was 2.2.  Anticoagulation responsible provider: Myrtis Ser MD, Tinnie Gens.  INR POC: 1.4.  Exp: 01/2011.    Anticoagulation Management Assessment/Plan:      The patient's current anticoagulation dose is Coumadin 5 mg tabs: Take as directed by coumadin clinic..  The target INR is 2 - 3.  The next INR is due 12/21/2009.  Anticoagulation instructions were given to patient.  Results were reviewed/authorized by Weston Brass, PharmD.  He was notified by Weston Brass PharmD.         Prior Anticoagulation Instructions: INR 1.6 Today take extra 1/2 pill then resume 1/2 pill everyday except 1 pill on Mondays. Recheck in 12 days.   Current Anticoagulation Instructions: INR 1.4  Take extra 1 tablet today (for a total of 1 1/2 tablets), take 1 tablet tomorrow then resume same dose of 1/2 tablet every day except 1 tablet  on Monday.

## 2010-06-25 NOTE — Assessment & Plan Note (Signed)
Summary: pc2   Visit Type:  Follow-up Primary Provider:  Blair Heys, MD   History of Present Illness: Mr. Gann returns today for followup.  He is a 70 yo man with a h/o PAF who underwent catheter ablation after failing multiple medications.  He has symptomatic tachybrady syndrome.  He has a h/o LV dysfunction with class 1 CHF symptoms EF 45%.  He was started on Multaq but has only been taking one tablet daily.  He feels well, is working out with a trainer until he injured his left leg, tearing ligaments.  Minimal palpitations.  Current Medications (verified): 1)  Propranolol Hcl 20 Mg Tabs (Propranolol Hcl) .... Take One Tablet By Mouth Three Times A Day 2)  Klor-Con M20 20 Meq Cr-Tabs (Potassium Chloride Crys Cr) .Marland Kitchen.. 1 By Mouth Daily. 3)  Pravastatin Sodium 40 Mg Tabs (Pravastatin Sodium) .... Take One Tablet By Mouth Daily At Bedtime 4)  Coumadin 5 Mg Tabs (Warfarin Sodium) .... Take As Directed By Coumadin Clinic. 5)  Tylenol Extra Strength 500 Mg Tabs (Acetaminophen) .... 2 Tablets By Mouth Daily As Needed For Pain 6)  Multaq 400 Mg Tabs (Dronedarone Hcl) .... Take 1 Tablet By Mouth Two Times A Day  Allergies: 1)  ! * Contrast Dye 2)  Demerol (Meperidine Hcl)  Past History:  Past Medical History: Last updated: 04/19/2008  1. Paroxysmal atrial fibrillation  2. Status post implantation of a Medtronic EnRhythm dual-chamber       pacemaker implanted in June 06, 2005, for sick sinus syndrome.   3. Hypertension.   4. Hyperlipidemia.   5. Status post left heart catheterization in 2005, which revealed 40%       stenosis of the mid left anterior descending artery.   6. Preserved ejection fraction.   7. Status post right total hip arthroplasty.   8. Status post surgery on both thumbs.   9. Degenerative joint disease.   10.Remote migraines.   11.Status post appendectomy.   12.Status post rhinoplasty and uvulectomy.   13.Status post cholecystectomy.   14.Status post right  rotator cuff repair in 2006.   Past Surgical History: Last updated: 04/19/2008  Status post implantation of a Medtronic EnRhythm dual-chamber       pacemaker implanted in June 06, 2005, for sick sinus syndrome.   Status post right total hip arthroplasty.   Status post surgery on both thumbs.   Status post appendectomy.   Review of Systems  The patient denies chest pain, syncope, dyspnea on exertion, and peripheral edema.    Vital Signs:  Patient profile:   70 year old male Height:      68 inches Weight:      186 pounds BMI:     28.38 Pulse rate:   60 / minute BP sitting:   110 / 80  (left arm)  Vitals Entered By: Laurance Flatten CMA (December 12, 2009 9:00 AM)  Physical Exam  General:  Well developed, well nourished, in no acute distress.  HEENT: normal Neck: supple. No JVD. Carotids 2+ bilaterally no bruits Cor: RRR no rubs, gallops or murmur Lungs: CTA Ab: soft, nontender. nondistended. No HSM. Good bowel sounds Ext: warm. no cyanosis, clubbing or edema. His left shoulder is in a sling. Neuro: alert and oriented. Grossly nonfocal. affect pleasant    PPM Specifications Following MD:  Lewayne Bunting, MD     PPM Vendor:  Medtronic     PPM Model Number:  [id number]     PPM Serial Number:  [id number]  PPM DOI:  06/06/2005      Lead 1    Location: [facility]     DOI: 06/06/2005     Model #: 1610     Serial #: [id number]     Status: active Lead 2    Location: [facility]     DOI: 06/06/2005     Model #: 1914     Serial #: [id number]     Status: active  Magnet Response Rate:  BOL 85 ERI  65  Indications:  Tachy-brady syndrome  Explantation Comments:  Coumadin Carelink  PPM Follow Up Battery Voltage:  2.99 V     Pacer Dependent:  No       PPM Device Measurements Atrium  Amplitude: 5.6 mV, Impedance: 528 ohms, Threshold: 0.5 V at 0.4 msec Right Ventricle  Amplitude: 7.8 mV, Impedance: 680 ohms, Threshold: 0.5 V at 0.4 msec  Episodes MS Episodes:  273     Percent Mode Switch:  18%      Coumadin:  Yes Ventricular High Rate:  0     Atrial Pacing:  80.6%     Ventricular Pacing:  33%  Parameters Mode:  DDDR     Lower Rate Limit:  60     Upper Rate Limit:  110 Paced AV Delay:  300     Sensed AV Delay:  300 Next Remote Date:  03/14/2010     Tech Comments:  NORMAL DEVICE FUNCTION.  PT IN AF 18% OF TIME SINCE 06-05-09 + COUMADIN.  NO CHANGES MADE.  CARELINK CHECK 03-14-10.  Vella Kohler  December 12, 2009 9:11 AM MD Comments:  Agree with above.  Impression & Recommendations:  Problem # 1:  ATRIAL FIBRILLATION (ICD-427.31) His symptoms are well controlled.  I have asked him to consider switching to Pradaxa.  His symptoms are well controlled. His updated medication list for this problem includes:    Propranolol Hcl 20 Mg Tabs (Propranolol hcl) .Marland Kitchen... Take one tablet by mouth three times a day    Coumadin 5 Mg Tabs (Warfarin sodium) .Marland Kitchen... Take as directed by coumadin clinic.    Multaq 400 Mg Tabs (Dronedarone hcl) .Marland Kitchen... Take 1 tablet by mouth two times a day  Problem # 2:  CARDIAC PACEMAKER IN SITU (ICD-V45.01) His device is working normally and will recheck in several months.  Problem # 3:  HYPERTENSION, BENIGN (ICD-401.1) His blood pressure is well controlled.  Continue meds as below. His updated medication list for this problem includes:    Propranolol Hcl 20 Mg Tabs (Propranolol hcl) .Marland Kitchen... Take one tablet by mouth three times a day  Patient Instructions: 1)  Your physician recommends that you schedule a follow-up appointment in: 6 months with Dr Ladona Ridgel 2)  call me back at [phone] and let me know if you want to change to Pradaxa 150mg  two times a day

## 2010-06-25 NOTE — Assessment & Plan Note (Signed)
Summary: pc2   Visit Type:  Follow-up Primary Provider:  Blair Heys, MD   History of Present Illness: [name] returns today for followup.  He is a 70 yo man with a h/o PAF who underwent catheter ablation after failing multiple medications.  He has symptomatic tachybrady syndrome.  He has a h/o LV dysfunction with class 1 CHF symptoms EF 45%.  He was started on Multaq but has only been taking one tablet daily.  He feels well, is working out with a Psychologist, educational and denies c/p.  He has lost some weight.  Current Medications (verified): 1)  Propranolol Hcl 20 Mg Tabs (Propranolol Hcl) .... Take One Tablet By Mouth Three Times A Day 2)  Klor-Con M20 20 Meq Cr-Tabs (Potassium Chloride Crys Cr) .Marland Kitchen.. 1 By Mouth Daily. 3)  Pravastatin Sodium 40 Mg Tabs (Pravastatin Sodium) .... Take One Tablet By Mouth Daily At Bedtime 4)  Coumadin 5 Mg Tabs (Warfarin Sodium) .... Take As Directed By Coumadin Clinic. 5)  Tylenol Extra Strength 500 Mg Tabs (Acetaminophen) .... 2 Tablets By Mouth Daily As Needed For Pain 6)  Multaq 400 Mg Tabs (Dronedarone Hcl) .... Take 1 Tablet By Mouth Once A Day  Allergies: 1)  ! * Contrast Dye 2)  Demerol (Meperidine Hcl)  Past History:  Past Medical History: Last updated: 04/19/2008  1. Paroxysmal atrial fibrillation  2. Status post implantation of a Medtronic EnRhythm dual-chamber       pacemaker implanted in June 06, 2005, for sick sinus syndrome.   3. Hypertension.   4. Hyperlipidemia.   5. Status post left heart catheterization in 2005, which revealed 40%       stenosis of the mid left anterior descending artery.   6. Preserved ejection fraction.   7. Status post right total hip arthroplasty.   8. Status post surgery on both thumbs.   9. Degenerative joint disease.   10.Remote migraines.   11.Status post appendectomy.   12.Status post rhinoplasty and uvulectomy.   13.Status post cholecystectomy.   14.Status post right rotator cuff repair in 2006.    Past Surgical History: Last updated: 04/19/2008  Status post implantation of a Medtronic EnRhythm dual-chamber       pacemaker implanted in June 06, 2005, for sick sinus syndrome.   Status post right total hip arthroplasty.   Status post surgery on both thumbs.   Status post appendectomy.   Review of Systems  The patient denies chest pain, syncope, dyspnea on exertion, peripheral edema, and weight gain.    Vital Signs:  Patient profile:   70 year old male Height:      68 inches Weight:      194 pounds BMI:     29.60 Pulse rate:   64 / minute BP sitting:   100 / 60  (left arm)  Vitals Entered By: Laurance Flatten CMA (June 05, 2009 10:20 AM)  Physical Exam  General:  Well developed, well nourished, in no acute distress.  HEENT: normal Neck: supple. No JVD. Carotids 2+ bilaterally no bruits Cor: RRR no rubs, gallops or murmur Lungs: CTA Ab: soft, nontender. nondistended. No HSM. Good bowel sounds Ext: warm. no cyanosis, clubbing or edema. His left shoulder is in a sling. Neuro: alert and oriented. Grossly nonfocal. affect pleasant    PPM Specifications Following MD:  Lewayne Bunting, MD     PPM Vendor:  Medtronic     PPM Model Number:  [id number]     PPM Serial Number:  [id number] PPM  DOI:  06/06/2005      Lead 1    Location: [facility]     DOI: 06/06/2005     Model #: 1610     Serial #: [id number]     Status: active Lead 2    Location: [facility]     DOI: 06/06/2005     Model #: 1914     Serial #: [id number]     Status: active  Magnet Response Rate:  BOL 85 ERI  65  Indications:  Tachy-brady syndrome  Explantation Comments:  Coumadin Carelink  PPM Follow Up Remote Check?  No Battery Voltage:  2.93 V     Pacer Dependent:  No       PPM Device Measurements Atrium  Amplitude: 1.8 mV, Impedance: 392 ohms,  Right Ventricle  Amplitude: 9.9 mV, Impedance: 600 ohms, Threshold: 0.5 V at 0.4 msec  Episodes Percent Mode Switch:  21.3%     Coumadin:  Yes Ventricular High Rate:  0      Atrial Pacing:  77.8%     Ventricular Pacing:  7.1%  Parameters Mode:  DDDR     Lower Rate Limit:  60     Upper Rate Limit:  110 Paced AV Delay:  300     Sensed AV Delay:  300 Next Remote Date:  09/05/2009     Next Cardiology Appt Due:  05/27/2010 Tech Comments:  Normal device function.  Heart rates not well controlled during afib.  No changes made today.  Pt does Carelink transmissions.  ROV 12 months Dr Ladona Ridgel. Gypsy Balsam RN BSN  June 05, 2009 10:29 AM  MD Comments:  Agree with above.    Impression & Recommendations:  Problem # 1:  ATRIAL FIBRILLATION (ICD-427.31) He is out of rhythm about 20% of the time.  I have asked him to increase his Multaq to twice daily. His updated medication list for this problem includes:    Propranolol Hcl 20 Mg Tabs (Propranolol hcl) .Marland Kitchen... Take one tablet by mouth three times a day    Coumadin 5 Mg Tabs (Warfarin sodium) .Marland Kitchen... Take as directed by coumadin clinic.    Multaq 400 Mg Tabs (Dronedarone hcl) .Marland Kitchen... Take 1 tablet by mouth once a day  Problem # 2:  CARDIAC PACEMAKER IN SITU (ICD-V45.01) His device is working normally.  I will see him back in several months to see how well his increased Multaq dose has done at reducing his atrial fibrillation burden.  Patient Instructions: 1)  Your physician recommends that you schedule a follow-up appointment in: 6 months with Dr Ladona Ridgel 2)  Your physician has recommended you make the following change in your medication: increase Multaq to 400mg  two times a day

## 2010-06-25 NOTE — Medication Information (Signed)
Summary: rov/tm  Anticoagulant Therapy  Managed by: [name], RN, BSN Referring MD: Sharrell Ku PCP: Blair Heys, MD Supervising MD: Juanda Chance MD, Abel Ra Indication 1: Atrial Fibrillation (ICD-427.31) Lab Used: [facility] Site: [facility] INR POC 1.3 INR RANGE 2 - 3  Dietary changes: no    Health status changes: no    Bleeding/hemorrhagic complications: no    Recent/future hospitalizations: no    Any changes in medication regimen? no    Recent/future dental: no  Any missed doses?: yes     Details: Off coumadin since 11/20/09 for procedure today.    Is patient compliant with meds? yes      Comments: Will resume coumadin post procedure today.    Allergies: 1)  ! * Contrast Dye 2)  Demerol (Meperidine Hcl)  Anticoagulation Management History:      The patient is taking warfarin and comes in today for a routine follow up visit.  Positive risk factors for bleeding include an age of 72 years or older.  The bleeding index is 'intermediate risk'.  Positive CHADS2 values include History of HTN.  Negative CHADS2 values include Age > 30 years old.  The start date was 09/24/2003.  His last INR was 2.2.  Anticoagulation responsible provider: Juanda Chance MD, Smitty Cords.  INR POC: 1.3.  Cuvette Lot#: [id number].  Exp: 01/2011.    Anticoagulation Management Assessment/Plan:      The patient's current anticoagulation dose is Coumadin 5 mg tabs: Take as directed by coumadin clinic..  The target INR is 2 - 3.  The next INR is due 11/30/2009.  Anticoagulation instructions were given to patient.  Results were reviewed/authorized by [name], RN, BSN.  He was notified by [name] RN.         Prior Anticoagulation Instructions: INR  2.2 Continue 2.5mg s everyday except 5mg s on Mondays. Recheck in 4 weeks.   Current Anticoagulation Instructions: INR 1.3  Take 1 tablet today after procedure, then resume same dosage 1/2 tablet daily except 1 tablet on Mondays.  Recheck in 1 week.

## 2010-06-25 NOTE — Medication Information (Signed)
Summary: ccr/ gd  Anticoagulant Therapy  Managed by: Eda Keys, PharmD Referring MD: Sharrell Ku PCP: Blair Heys, MD Supervising MD: Antoine Poche MD, Fayrene Fearing Indication 1: Atrial Fibrillation (ICD-427.31) Lab Used: [facility] Site: [facility] INR POC 1.4 INR RANGE 2 - 3  Dietary changes: no    Health status changes: no    Bleeding/hemorrhagic complications: no    Recent/future hospitalizations: no    Any changes in medication regimen? no    Recent/future dental: no  Any missed doses?: yes     Details: d/c coumadin last thursday, Feb 24th, for hip injection today 07/24/09  Is patient compliant with meds? yes      Comments: Hip injection today 07/24/09, here for pre-INR check  Allergies: 1)  ! * Contrast Dye 2)  Demerol (Meperidine Hcl)  Anticoagulation Management History:      The patient is taking warfarin and comes in today for a routine follow up visit.  Positive risk factors for bleeding include an age of 72 years or older.  The bleeding index is 'intermediate risk'.  Positive CHADS2 values include History of HTN.  Negative CHADS2 values include Age > 50 years old.  The start date was 09/24/2003.  His last INR was 2.2.  Anticoagulation responsible provider: Antoine Poche MD, Fayrene Fearing.  INR POC: 1.4.  Cuvette Lot#: [id number].  Exp: 09/2010.    Anticoagulation Management Assessment/Plan:      The patient's current anticoagulation dose is Coumadin 5 mg tabs: Take as directed by coumadin clinic..  The target INR is 2 - 3.  The next INR is due 08/03/2009.  Anticoagulation instructions were given to patient.  Results were reviewed/authorized by Eda Keys, PharmD.  He was notified by Eda Keys.         Prior Anticoagulation Instructions: INR 2.5  Continue on same dosage 1/2 tablet daily except 1 tablet on Thursdays.   Recheck in 4 weeks.    Current Anticoagulation Instructions: INR 1.4  Resume normal dosing schedule of 1/2 tablet every day, except 1 tablet on  Thursday.  Return to clinic in 10 days.

## 2010-06-25 NOTE — Medication Information (Signed)
Summary: rov/tm  Anticoagulant Therapy  Managed by: [name], RN, BSN Referring MD: Sharrell Ku PCP: Blair Heys, MD Supervising MD: Antoine Poche MD, Fayrene Fearing Indication 1: Atrial Fibrillation (ICD-427.31) Lab Used: [facility] Site: [facility] INR POC 2.5 INR RANGE 2 - 3  Dietary changes: no    Health status changes: no    Bleeding/hemorrhagic complications: no    Recent/future hospitalizations: no    Any changes in medication regimen? no    Recent/future dental: no  Any missed doses?: no       Is patient compliant with meds? yes       Allergies: 1)  ! * Contrast Dye 2)  Demerol (Meperidine Hcl)  Anticoagulation Management History:      The patient is taking warfarin and comes in today for a routine follow up visit.  Positive risk factors for bleeding include an age of 41 years or older.  The bleeding index is 'intermediate risk'.  Positive CHADS2 values include History of HTN.  Negative CHADS2 values include Age > 59 years old.  The start date was 09/24/2003.  His last INR was 2.2.  Anticoagulation responsible provider: Antoine Poche MD, Fayrene Fearing.  INR POC: 2.5.  Exp: 08/2010.    Anticoagulation Management Assessment/Plan:      The patient's current anticoagulation dose is Coumadin 5 mg tabs: Take as directed by coumadin clinic..  The target INR is 2 - 3.  The next INR is due 08/16/2009.  Anticoagulation instructions were given to patient.  Results were reviewed/authorized by [name], RN, BSN.  He was notified by [name] RN.         Prior Anticoagulation Instructions: INR 1.8 Today take extra 2.5mg s then resume 2.5mg s daily except 5mg s on Thursdays. Recheck in 3 weeks.   Current Anticoagulation Instructions: INR 2.5  Continue on same dosage 1/2 tablet daily except 1 tablet on Thursdays.   Recheck in 4 weeks.

## 2010-06-25 NOTE — Cardiovascular Report (Signed)
Summary: Office Visit Remote   Office Visit Remote   Imported By: Roderic Ovens 04/02/2010 15:19:37  _____________________________________________________________________  External Attachment:    Type:   Image     Comment:   External Document

## 2010-06-25 NOTE — Medication Information (Signed)
Summary: rov/tm  Anticoagulant Therapy  Managed by: Bethena Midget, RN, BSN Referring MD: Sharrell Ku PCP: Blair Heys, MD Supervising MD: Juanda Chance MD, Joselyne Spake Indication 1: Atrial Fibrillation (ICD-427.31) Lab Used: [facility] Site: [facility] INR POC 1.8 INR RANGE 2 - 3  Dietary changes: yes       Details: Has eaten extra vitamin K.   Health status changes: no    Bleeding/hemorrhagic complications: no    Recent/future hospitalizations: no    Any changes in medication regimen? no    Recent/future dental: no  Any missed doses?: no       Is patient compliant with meds? yes       Allergies: 1)  ! * Contrast Dye 2)  Demerol (Meperidine Hcl)  Anticoagulation Management History:      The patient is taking warfarin and comes in today for a routine follow up visit.  Positive risk factors for bleeding include an age of 72 years or older.  The bleeding index is 'intermediate risk'.  Positive CHADS2 values include History of HTN.  Negative CHADS2 values include Age > 66 years old.  The start date was 09/24/2003.  His last INR was 2.2.  Anticoagulation responsible provider: Juanda Chance MD, Smitty Cords.  INR POC: 1.8.  Cuvette Lot#: [id number].  Exp: 08/2010.    Anticoagulation Management Assessment/Plan:      The patient's current anticoagulation dose is Coumadin 5 mg tabs: Take as directed by coumadin clinic..  The target INR is 2 - 3.  The next INR is due 07/19/2009.  Anticoagulation instructions were given to patient.  Results were reviewed/authorized by Bethena Midget, RN, BSN.  He was notified by Bethena Midget, RN, BSN.         Prior Anticoagulation Instructions: INR 3.4 Skip Thursdays dose then resume 2.5mg s daily except 5mg s on Thursdays. Recheck in 2-3 weeks.   Current Anticoagulation Instructions: INR 1.8 Today take extra 2.5mg s then resume 2.5mg s daily except 5mg s on Thursdays. Recheck in 3 weeks.

## 2010-06-25 NOTE — Progress Notes (Signed)
Summary: pt needs to stop bloodthiner for surgery  Phone Note From Other Clinic Call back at [phone]   Caller: Referral Coordinator Request: Talk with Nurse, Talk with Provider Summary of Call: pt needs medical clearence to stop blood thinner for arthrogram of left knee 4days prior and he will need to be set up for inr visit the day of surgery please contact Roberta at number listed above fax# [phone] Initial call taken by: Omer Jack,  November 20, 2009 1:42 PM  Follow-up for Phone Call        discussed with Dr Ladona Ridgel ok for pt to d/c coumadin 4 days prior and restart day of procedure.  Will need to let our Coumadin clinic know so they can get him an appoinment to be seen and followed. Dennis Bast, RN, BSN  November 20, 2009 5:31 PM agree with above Follow-up by: Laren Boom, MD, [facility],  November 20, 2009 5:34 PM     Appended Document: pt needs to stop bloodthiner for surgery faxed to [facility]

## 2010-06-25 NOTE — Letter (Signed)
Summary: Remote Device Check  [facility] [employer], Main Office  [address]   [place], [place] [address]   Phone: [phone]  Fax: [phone]     March 29, 2010 MRN: [id number]   [name] [name] [address] [place], [place]  [address]   Dear Mr. Jahr,   Your remote transmission was recieved and reviewed by your physician.  All diagnostics were within normal limits for you.  _____Your next transmission is scheduled for:                       .  Please transmit at any time this day.  If you have a wireless device your transmission will be sent automatically.  ____X__Your next office visit is scheduled for:  06/11/2010@ 2:20pm with Dr. Ladona Ridgel.                                Sincerely,  Altha Harm, LPN

## 2010-06-25 NOTE — Medication Information (Signed)
Summary: ccr  Anticoagulant Therapy  Managed by: Reina Fuse, PharmD Referring MD: Sharrell Ku PCP: Blair Heys, MD Supervising MD: Juanda Chance MD, Bruce Indication 1: Atrial Fibrillation (ICD-427.31) Lab Used: [facility] Site: [facility] INR POC 1.5 INR RANGE 2 - 3  Dietary changes: no    Health status changes: no    Bleeding/hemorrhagic complications: no    Recent/future hospitalizations: no    Any changes in medication regimen? no    Recent/future dental: no  Any missed doses?: no       Is patient compliant with meds? yes      Comments: Pt switching back from Pradaxa to Coumadin due to GI upset. Started back on Coumadin 10/27.  Resumed past dose of 5 mg on Mondays and 2.5 mg all other days.   Allergies: 1)  ! * Contrast Dye 2)  Demerol (Meperidine Hcl)  Anticoagulation Management History:      The patient is taking warfarin and comes in today for a routine follow up visit.  Positive risk factors for bleeding include an age of 72 years or older.  The bleeding index is 'intermediate risk'.  Positive CHADS2 values include History of HTN.  Negative CHADS2 values include Age > 2 years old.  The start date was 09/24/2003.  His last INR was 2.2.  Anticoagulation responsible provider: Juanda Chance MD, Smitty Cords.  INR POC: 1.5.  Cuvette Lot#: [id number].  Exp: 02/2011.    Anticoagulation Management Assessment/Plan:      The patient's current anticoagulation dose is Warfarin sodium 5 mg tabs: Use as directed by anticoagulation clinic..  The target INR is 2 - 3.  The next INR is due 04/05/2010.  Anticoagulation instructions were given to patient.  Results were reviewed/authorized by Reina Fuse, PharmD.  He was notified by Reina Fuse PharmD.         Prior Anticoagulation Instructions: INR 3.1  Continue to hold Coumadin.   Current Anticoagulation Instructions: INR 1.5  Take Coumadin 0.5 tab (2.5 mg) on all days except Coumadin 1 tab (5 mg) on Mondays and Saturdays. Return to clinic in 1  week.  Prescriptions: WARFARIN SODIUM 5 MG TABS (WARFARIN SODIUM) Use as directed by anticoagulation clinic.  #30 x 1   Entered by:   Reina Fuse PharmD   Authorized by:   Lenoria Farrier, MD, [facility]   Signed by:   Reina Fuse PharmD on 03/29/2010   Method used:   Electronically to        [facility] (retail)       [address]       [place], [place]  [address]       Ph: [phone]       Fax: [phone]   RxID:   [id number]

## 2010-06-25 NOTE — Progress Notes (Signed)
Summary: wants a call from [facility]  Phone Note Call from Patient   Caller: Patient Reason for Call: Talk to Nurse Summary of Call: wants a call from [facility] call [phone] Initial call taken by: Glynda Jaeger,  December 17, 2009 9:11 AM  Follow-up for Phone Call        spoke with pt made aware I was out of office yesterday.  He will hold his Coumadin for two days and come in on Fri to have his INR checked and start the Pradaxa then.  Pt aware and samples out front with rx savings card Dennis Bast, RN, BSN  December 18, 2009 2:41 PM    New/Updated Medications: PRADAXA 150 MG CAPS (DABIGATRAN ETEXILATE MESYLATE) one by mouth two times a day Prescriptions: PRADAXA 150 MG CAPS (DABIGATRAN ETEXILATE MESYLATE) one by mouth two times a day  #60 x 3   Entered by:   Dennis Bast, RN, BSN   Authorized by:   Laren Boom, MD, [facility]   Signed by:   Dennis Bast, RN, BSN on 12/18/2009   Method used:   Electronically to        [facility] (retail)       [address]       [place], [place]  [address]       Ph: [phone]       Fax: [phone]   RxID:   [id number]

## 2010-06-25 NOTE — Medication Information (Signed)
Summary: rov/eac  Anticoagulant Therapy  Managed by: Eda Keys, PharmD Referring MD: Sharrell Ku PCP: Blair Heys, MD Supervising MD: Antoine Poche MD, Fayrene Fearing Indication 1: Atrial Fibrillation (ICD-427.31) Lab Used: [facility] Site: [facility] INR POC 3.8 INR RANGE 2 - 3  Dietary changes: no    Health status changes: no    Bleeding/hemorrhagic complications: no    Recent/future hospitalizations: no    Any changes in medication regimen? no    Recent/future dental: no  Any missed doses?: no       Is patient compliant with meds? yes      Comments: Patient was off coumadin and started back on Tuesday Mar 1st (after his last appt with [place]).    Allergies: 1)  ! * Contrast Dye 2)  Demerol (Meperidine Hcl)  Anticoagulation Management History:      The patient is taking warfarin and comes in today for a routine follow up visit.  Positive risk factors for bleeding include an age of 72 years or older.  The bleeding index is 'intermediate risk'.  Positive CHADS2 values include History of HTN.  Negative CHADS2 values include Age > 72 years old.  The start date was 09/24/2003.  His last INR was 2.2.  Anticoagulation responsible provider: Antoine Poche MD, Fayrene Fearing.  INR POC: 3.8.  Cuvette Lot#: [id number].  Exp: 09/2010.    Anticoagulation Management Assessment/Plan:      The patient's current anticoagulation dose is Coumadin 5 mg tabs: Take as directed by coumadin clinic..  The target INR is 2 - 3.  The next INR is due 08/17/2009.  Anticoagulation instructions were given to patient.  Results were reviewed/authorized by Eda Keys, PharmD.  He was notified by Eda Keys.         Prior Anticoagulation Instructions: INR 1.4  Resume normal dosing schedule of 1/2 tablet every day, except 1 tablet on Thursday.  Return to clinic in 10 days.  Current Anticoagulation Instructions: INR 3.8  Do NOT take coumadin tomorrow (Saturday).  Then return to normal dosing schedule.  Return to  clinic in 2 weeks.

## 2010-06-25 NOTE — Cardiovascular Report (Signed)
Summary: Office Visit   Office Visit   Imported By: Roderic Ovens 12/13/2009 15:14:42  _____________________________________________________________________  External Attachment:    Type:   Image     Comment:   External Document

## 2010-06-25 NOTE — Medication Information (Signed)
Summary: rov/tm  Anticoagulant Therapy  Managed by: Bethena Midget, RN, BSN Referring MD: Sharrell Ku PCP: Blair Heys, MD Supervising MD: Tenny Craw MD, Gunnar Fusi Indication 1: Atrial Fibrillation (ICD-427.31) Lab Used: [facility] Site: [facility] INR POC 2.8 INR RANGE 2 - 3  Dietary changes: no    Health status changes: no    Bleeding/hemorrhagic complications: no    Recent/future hospitalizations: no    Any changes in medication regimen? no    Recent/future dental: no  Any missed doses?: no       Is patient compliant with meds? yes       Allergies: 1)  ! * Contrast Dye 2)  Demerol (Meperidine Hcl)  Anticoagulation Management History:      The patient is taking warfarin and comes in today for a routine follow up visit.  Positive risk factors for bleeding include an age of 2 years or older.  The bleeding index is 'intermediate risk'.  Positive CHADS2 values include History of HTN.  Negative CHADS2 values include Age > 75 years old.  The start date was 09/24/2003.  His last INR was 2.2.  Anticoagulation responsible provider: Tenny Craw MD, Gunnar Fusi.  INR POC: 2.8.  Cuvette Lot#: [id number].  Exp: 10/2010.    Anticoagulation Management Assessment/Plan:      The patient's current anticoagulation dose is Coumadin 5 mg tabs: Take as directed by coumadin clinic..  The target INR is 2 - 3.  The next INR is due 10/29/2009.  Anticoagulation instructions were given to patient.  Results were reviewed/authorized by Bethena Midget, RN, BSN.  He was notified by Bethena Midget, RN, BSN.         Prior Anticoagulation Instructions: INR 1.9 Change dose to 1/2 tab everyday except on Mondays take 1 tab. Recheck in 3 weeks.   Current Anticoagulation Instructions: INR 2.8 Continue 2.5mg s daily except 5mg s on Mondays. Recheck in 4 weeks.

## 2010-06-25 NOTE — Medication Information (Signed)
Summary: rov/tm  Anticoagulant Therapy  Managed by: Bethena Midget, RN, BSN Referring MD: Sharrell Ku PCP: Blair Heys, MD Supervising MD: Antoine Poche MD, Fayrene Fearing Indication 1: Atrial Fibrillation (ICD-427.31) Lab Used: [facility] Site: [facility] INR POC 2.2 INR RANGE 2 - 3  Dietary changes: no    Health status changes: no    Bleeding/hemorrhagic complications: no    Recent/future hospitalizations: no    Any changes in medication regimen? no    Recent/future dental: no  Any missed doses?: no       Is patient compliant with meds? yes       Allergies: 1)  ! * Contrast Dye 2)  Demerol (Meperidine Hcl)  Anticoagulation Management History:      The patient is taking warfarin and comes in today for a routine follow up visit.  Positive risk factors for bleeding include an age of 78 years or older.  The bleeding index is 'intermediate risk'.  Positive CHADS2 values include History of HTN.  Negative CHADS2 values include Age > 57 years old.  The start date was 09/24/2003.  His last INR was 2.2.  Anticoagulation responsible provider: Antoine Poche MD, Fayrene Fearing.  INR POC: 2.2.  Cuvette Lot#: [id number].  Exp: 12/2010.    Anticoagulation Management Assessment/Plan:      The patient's current anticoagulation dose is Coumadin 5 mg tabs: Take as directed by coumadin clinic..  The target INR is 2 - 3.  The next INR is due 11/27/2009.  Anticoagulation instructions were given to patient.  Results were reviewed/authorized by Bethena Midget, RN, BSN.  He was notified by Bethena Midget, RN, BSN.         Prior Anticoagulation Instructions: INR 2.8 Continue 2.5mg s daily except 5mg s on Mondays. Recheck in 4 weeks.   Current Anticoagulation Instructions: INR  2.2 Continue 2.5mg s everyday except 5mg s on Mondays. Recheck in 4 weeks.

## 2010-06-25 NOTE — Letter (Signed)
Summary: [facility]   Imported By: Marylou Mccoy 01/23/2010 09:46:01  _____________________________________________________________________  External Attachment:    Type:   Image     Comment:   External Document

## 2010-06-25 NOTE — Medication Information (Signed)
Summary: rov/sp  Anticoagulant Therapy  Managed by: Weston Brass, PharmD Referring MD: Sharrell Ku PCP: Blair Heys, MD Supervising MD: Jens Som MD, Arlys John Indication 1: Atrial Fibrillation (ICD-427.31) Lab Used: [facility] Site: [facility] INR POC 3.1 INR RANGE 2 - 3  Dietary changes: no    Health status changes: no    Bleeding/hemorrhagic complications: no    Recent/future hospitalizations: no    Any changes in medication regimen? yes       Details: plan on changing from Coumadin to Pradaxa   Recent/future dental: no  Any missed doses?: yes     Details: skipped 2 doses of Coumadin  Is patient compliant with meds? yes       Allergies: 1)  ! * Contrast Dye 2)  Demerol (Meperidine Hcl)  Anticoagulation Management History:      The patient is taking warfarin and comes in today for a routine follow up visit.  Positive risk factors for bleeding include an age of 72 years or older.  The bleeding index is 'intermediate risk'.  Positive CHADS2 values include History of HTN.  Negative CHADS2 values include Age > 78 years old.  The start date was 09/24/2003.  His last INR was 2.2.  Anticoagulation responsible provider: Jens Som MD, Arlys John.  INR POC: 3.1.  Cuvette Lot#: [id number].  Exp: 02/2011.    Anticoagulation Management Assessment/Plan:      The target INR is 2 - 3.  The next INR is due 12/24/2009.  Anticoagulation instructions were given to patient.  Results were reviewed/authorized by Weston Brass, PharmD.  He was notified by Weston Brass PharmD.         Prior Anticoagulation Instructions: INR 1.4  Take extra 1 tablet today (for a total of 1 1/2 tablets), take 1 tablet tomorrow then resume same dose of 1/2 tablet every day except 1 tablet on Monday.   Current Anticoagulation Instructions: INR 3.1  Continue to hold Coumadin.

## 2010-06-25 NOTE — Letter (Signed)
Summary: [facility] Surgical Clearance   [facility]   Imported By: Roderic Ovens 01/21/2010 16:05:36  _____________________________________________________________________  External Attachment:    Type:   Image     Comment:   External Document

## 2010-06-25 NOTE — Letter (Signed)
Summary: [facility]   [facility]   Imported By: Roderic Ovens 02/11/2010 13:45:04  _____________________________________________________________________  External Attachment:    Type:   Image     Comment:   External Document  Appended Document: [facility] Surgical Clearance  Please have [name] [name] proceed to surgery if medically indicated without further cardiac testing.He should stop pradaxa 4 days prior to the surgery and restart 24 hours after surgery.

## 2010-06-25 NOTE — Medication Information (Signed)
Summary: rov/mb  Anticoagulant Therapy  Managed by: Bethena Midget, RN, BSN Referring MD: Sharrell Ku PCP: Blair Heys, MD Supervising MD: Eden Emms MD, Theron Arista Indication 1: Atrial Fibrillation (ICD-427.31) Lab Used: [facility] Site: [facility] INR POC 3.4 INR RANGE 2 - 3  Dietary changes: yes       Details: has eaten less green leafy vegetables.   Health status changes: no    Bleeding/hemorrhagic complications: no    Recent/future hospitalizations: no    Any changes in medication regimen? no    Recent/future dental: no  Any missed doses?: no       Is patient compliant with meds? yes       Allergies: 1)  ! * Contrast Dye 2)  Demerol (Meperidine Hcl)  Anticoagulation Management History:      The patient is taking warfarin and comes in today for a routine follow up visit.  Positive risk factors for bleeding include an age of 72 years or older.  The bleeding index is 'intermediate risk'.  Positive CHADS2 values include History of HTN.  Negative CHADS2 values include Age > 12 years old.  The start date was 09/24/2003.  His last INR was 2.2.  Anticoagulation responsible provider: Eden Emms MD, Theron Arista.  INR POC: 3.4.  Cuvette Lot#: [id number].  Exp: 08/2010.    Anticoagulation Management Assessment/Plan:      The patient's current anticoagulation dose is Coumadin 5 mg tabs: Take as directed by coumadin clinic..  The target INR is 2 - 3.  The next INR is due 06/28/2009.  Anticoagulation instructions were given to patient.  Results were reviewed/authorized by Bethena Midget, RN, BSN.  He was notified by Bethena Midget, RN, BSN.         Prior Anticoagulation Instructions: INR = 2.2  The patient is to continue with the same dose of coumadin.  This dosage includes: take 1/2 tablet every day except for thursday take a whole tablet.   Current Anticoagulation Instructions: INR 3.4 Skip Thursdays dose then resume 2.5mg s daily except 5mg s on Thursdays. Recheck in 2-3 weeks.

## 2010-06-25 NOTE — Progress Notes (Signed)
Summary: Calling regarding paper work  Phone Note Call from Patient Call back at [employer] [phone] Call back at [phone]   Caller: Patient Summary of Call: Pt checking on paper work regarding knee surgery Initial call taken by: Judie Grieve,  January 16, 2010 9:09 AM  Follow-up for Phone Call        will have Dr Ladona Ridgel look at paperwork regarding clearance for knee surgery tomorrow.  Pt aware.\par Dennis Bast, RN, BSN  January 16, 2010 9:18 AM  per pt calling back, status of clearance letter [phone] Lorne Skeens  January 21, 2010 8:12 AM  spoke with pt's wife in regards to clearance.  They recieved and surgery is scheduled on Friday Dennis Bast, RN, BSN  January 23, 2010 9:30 AM

## 2010-06-25 NOTE — Progress Notes (Signed)
Summary: Calling about surgical clearance for pt  Phone Note Other Incoming Call back at [phone]   Caller: Dr.Ehinger Summary of Call: Dr.Ehinger calling regarding this pt and his Surgical Clearance Initial call taken by: Judie Grieve,  January 22, 2010 8:59 AM  Follow-up for Phone Call        clearance re-faxed to [phone] per request. Follow-up by: Charolotte Capuchin, RN,  January 22, 2010 9:13 AM

## 2010-06-25 NOTE — Medication Information (Signed)
Summary: rov/mw  Anticoagulant Therapy  Managed by: Bethena Midget, RN, BSN Referring MD: Sharrell Ku PCP: Blair Heys, MD Supervising MD: Tenny Craw MD, Gunnar Fusi Indication 1: Atrial Fibrillation (ICD-427.31) Lab Used: [facility]  Site: [facility] INR POC 2.8 INR RANGE 2 - 3  Dietary changes: no    Health status changes: no    Bleeding/hemorrhagic complications: no    Recent/future hospitalizations: no    Any changes in medication regimen? no    Recent/future dental: no  Any missed doses?: no       Is patient compliant with meds? yes      Comments: Pending knee replacement at end of Dec. sees Ortho MD on 12/16 so we will see him after that appt on same day because the next week pt states he will probably be in [place].  Allergies: 1)  ! * Contrast Dye 2)  Demerol (Meperidine Hcl)  Anticoagulation Management History:      The patient is taking warfarin and comes in today for a routine follow up visit.  Positive risk factors for bleeding include an age of 72 years or older.  The bleeding index is 'intermediate risk'.  Positive CHADS2 values include History of HTN.  Negative CHADS2 values include Age > 4 years old.  The start date was 09/24/2003.  His last INR was 2.2.  Anticoagulation responsible provider: Tenny Craw MD, Gunnar Fusi.  INR POC: 2.8.  Cuvette Lot#: [id number].  Exp: 04/2011.    Anticoagulation Management Assessment/Plan:      The patient's current anticoagulation dose is Warfarin sodium 5 mg tabs: Use as directed by anticoagulation clinic..  The target INR is 2 - 3.  The next INR is due 05/10/2010.  Anticoagulation instructions were given to patient.  Results were reviewed/authorized by Bethena Midget, RN, BSN.  He was notified by Bethena Midget, RN, BSN.         Prior Anticoagulation Instructions: INR 2.5 Continue taking 1 tablet on monday and saturday. And a half tablet all other days. Recheck in 1 week.   Current Anticoagulation Instructions: INR 2.8 Continue 2.5mg s everyday  except 5mg s on Mondays and Saturdays. Recheck in 2 weeks.

## 2010-06-25 NOTE — Medication Information (Signed)
Summary: rov/sp  Anticoagulant Therapy  Managed by: Inactive Referring MD: [name] [name] PCP: [name] Heys, MD Supervising MD: [name] Emms MD, [name] [name] Indication 1: Atrial Fibrillation (ICD-427.31) Lab Used: [facility] [place] Site: [facility] INR POC 1.2 INR RANGE 2 - 3  Dietary changes: no    Health status changes: no    Bleeding/hemorrhagic complications: no    Recent/future hospitalizations: no    Any changes in medication regimen? no    Recent/future dental: no  Any missed doses?: yes     Details: Holding Coumadin at present, pending Pradaxa start.    Is patient compliant with meds? yes      Comments: Pt OK to start Pradaxa twice daily.  Going to Check CBC on 01/02/10.  [name] Reams RN  December 24, 2009 10:50 AM   Allergies: 1)  ! * Contrast Dye 2)  Demerol (Meperidine Hcl)  Anticoagulation Management History:      Positive risk factors for bleeding include an age of 1 years or older.  The bleeding index is 'intermediate risk'.  Positive CHADS2 values include History of HTN.  Negative CHADS2 values include Age > 91 years old.  The start date was 09/24/2003.  His last INR was 2.2.  Anticoagulation responsible provider: Eden Emms MD, [name] [name].  INR POC: 1.2.  Exp: 02/2011.    Anticoagulation Management Assessment/Plan:      The target INR is 2 - 3.  The next INR is due 12/24/2009.  Anticoagulation instructions were given to patient.  Results were reviewed/authorized by Inactive.         Prior Anticoagulation Instructions: INR 3.1  Continue to hold Coumadin.

## 2010-06-25 NOTE — Medication Information (Signed)
Summary: rov/ewj  Anticoagulant Therapy  Managed by: Bethena Midget, RN, BSN Referring MD: Sharrell Ku PCP: Blair Heys, MD Supervising MD: Antoine Poche MD, Fayrene Fearing Indication 1: Atrial Fibrillation (ICD-427.31) Lab Used: [facility] [place] Site: [facility] INR POC 1.6 INR RANGE 2 - 3  Dietary changes: no    Health status changes: no    Bleeding/hemorrhagic complications: no    Recent/future hospitalizations: no    Any changes in medication regimen? yes       Details: Percocet PRN  Recent/future dental: no  Any missed doses?: yes     Details: Took last dose o n6/27/11 and resumed 11/23/09  Is patient compliant with meds? yes      Comments: On 11/23/09 had Orthrogram and CT on Left knee. Then on 11/24/09 fell at home call MD of call and was given instructions and he did follow up on 11/27/09 at their office.   Allergies: 1)  ! * Contrast Dye 2)  Demerol (Meperidine Hcl)  Anticoagulation Management History:      The patient is taking warfarin and comes in today for a routine follow up visit.  Positive risk factors for bleeding include an age of 40 years or older.  The bleeding index is 'intermediate risk'.  Positive CHADS2 values include History of HTN.  Negative CHADS2 values include Age > 10 years old.  The start date was 09/24/2003.  His last INR was 2.2.  Anticoagulation responsible provider: Antoine Poche MD, Fayrene Fearing.  INR POC: 1.6.  Cuvette Lot#: [id number].  Exp: 01/2011.    Anticoagulation Management Assessment/Plan:      The patient's current anticoagulation dose is Coumadin 5 mg tabs: Take as directed by coumadin clinic..  The target INR is 2 - 3.  The next INR is due 12/12/2009.  Anticoagulation instructions were given to patient.  Results were reviewed/authorized by Bethena Midget, RN, BSN.  He was notified by Bethena Midget, RN, BSN.         Prior Anticoagulation Instructions: INR 1.3  Take 1 tablet today after procedure, then resume same dosage 1/2 tablet daily except 1 tablet on Mondays.   Recheck in 1 week.    Current Anticoagulation Instructions: INR 1.6 Today take extra 1/2 pill then resume 1/2 pill everyday except 1 pill on Mondays. Recheck in 12 days.

## 2010-06-25 NOTE — Medication Information (Signed)
Summary: [name] [name]  Anticoagulant Therapy  Managed by: Bethena Midget, RN, BSN Referring MD: Sharrell Ku PCP: Blair Heys, MD Supervising MD: Juanda Chance MD, Guyla Bless Indication 1: Atrial Fibrillation (ICD-427.31) Lab Used: [facility] [place] Site: [facility] INR POC 1.9 INR RANGE 2 - 3  Dietary changes: no    Health status changes: no    Bleeding/hemorrhagic complications: no    Recent/future hospitalizations: no    Any changes in medication regimen? no    Recent/future dental: no  Any missed doses?: no       Is patient compliant with meds? yes       Allergies: 1)  ! * Contrast Dye 2)  Demerol (Meperidine Hcl)  Anticoagulation Management History:      The patient is taking warfarin and comes in today for a routine follow up visit.  Positive risk factors for bleeding include an age of 72 years or older.  The bleeding index is 'intermediate risk'.  Positive CHADS2 values include History of HTN.  Negative CHADS2 values include Age > 65 years old.  The start date was 09/24/2003.  His last INR was 2.2.  Anticoagulation responsible provider: Juanda Chance MD, Smitty Cords.  INR POC: 1.9.  Cuvette Lot#: [id number].  Exp: 09/2010.    Anticoagulation Management Assessment/Plan:      The patient's current anticoagulation dose is Coumadin 5 mg tabs: Take as directed by coumadin clinic..  The target INR is 2 - 3.  The next INR is due 10/01/2009.  Anticoagulation instructions were given to patient.  Results were reviewed/authorized by Bethena Midget, RN, BSN.  He was notified by Bethena Midget, RN, BSN.         Prior Anticoagulation Instructions: INR 2.3  Continue on same dosage 1/2 tablet daily.  Recheck in 3 weeks.    Current Anticoagulation Instructions: INR 1.9 Change dose to 1/2 tab everyday except on Mondays take 1 tab. Recheck in 3 weeks.

## 2010-06-25 NOTE — Letter (Signed)
Summary: Remote Device Check  [facility], Main Office  [address]   [place], [place] [address]   Phone: [phone]  Fax: [phone]     September 12, 2009 MRN: [id number]   [name] [name] [address] [place], [place]  [address]   Dear [name] [name],   Your remote transmission was recieved and reviewed by your physician.  All diagnostics were within normal limits for you.    ___X___Your next office visit is scheduled for:     December 12, 2009 AT 9AM WITH DR Ladona Ridgel. Please call our office to schedule an appointment.    Sincerely,  Proofreader

## 2010-06-25 NOTE — Cardiovascular Report (Signed)
Summary: Interrogation: Quick Look  Interrogation: Quick Look   Imported By: Kassie Mends 06/20/2009 08:56:36  _____________________________________________________________________  External Attachment:    Type:   Image     Comment:   External Document

## 2010-06-25 NOTE — Medication Information (Signed)
Summary: rov/sl  Anticoagulant Therapy  Managed by: Reina Fuse, PharmD Referring MD: Sharrell Ku PCP: Blair Heys, MD Supervising MD: Myrtis Ser MD, Tinnie Gens Indication 1: Atrial Fibrillation (ICD-427.31) Lab Used: [facility] [place] Site: [facility] INR POC 2.7 INR RANGE 2 - 3  Dietary changes: no    Health status changes: no    Bleeding/hemorrhagic complications: no    Recent/future hospitalizations: no    Any changes in medication regimen? no    Recent/future dental: no  Any missed doses?: no       Is patient compliant with meds? yes      Comments: Pt started back on Coumadin last week after switching to Pradaxa for a couple of months.   Allergies: 1)  ! * Contrast Dye 2)  Demerol (Meperidine Hcl)  Anticoagulation Management History:      Positive risk factors for bleeding include an age of 72 years or older.  The bleeding index is 'intermediate risk'.  Positive CHADS2 values include History of HTN.  Negative CHADS2 values include Age > 72 years old.  The start date was 09/24/2003.  His last INR was 2.2.  Anticoagulation responsible provider: Myrtis Ser MD, Tinnie Gens.  INR POC: 2.7.  Exp: 02/2011.    Anticoagulation Management Assessment/Plan:      The patient's current anticoagulation dose is Warfarin sodium 5 mg tabs: Use as directed by anticoagulation clinic..  The target INR is 2 - 3.  The next INR is due 04/12/2010.  Anticoagulation instructions were given to patient.  Results were reviewed/authorized by Reina Fuse, PharmD.  He was notified by Reina Fuse PharmD.         Prior Anticoagulation Instructions: INR 1.5  Take Coumadin 0.5 tab (2.5 mg) on all days except Coumadin 1 tab (5 mg) on Mondays and Saturdays. Return to clinic in 1 week.   Current Anticoagulation Instructions: INR 2.7  Continue taking Coumadin 0.5 tab (2.5 mg) on all days except Coumadin 1 tab (5 mg) on Mondays and Saturdays. Return to clinic in 1 week.

## 2010-06-27 NOTE — Medication Information (Signed)
Summary: rov/tm  Anticoagulant Therapy  Managed by: Bethena Midget, RN, BSN Referring MD: Sharrell Ku PCP: Blair Heys, MD Supervising MD: Daleen Squibb MD, Maisie Fus Indication 1: Atrial Fibrillation (ICD-427.31) Lab Used: [facility] [place] Site: [facility] INR POC 2.0 INR RANGE 2 - 3  Dietary changes: no    Health status changes: no    Bleeding/hemorrhagic complications: no    Recent/future hospitalizations: no    Any changes in medication regimen? no    Recent/future dental: no  Any missed doses?: no       Is patient compliant with meds? yes      Comments: Knee Sx on 05/22/10 needs to be off 5 days prior phone note sent to Dr Ladona Ridgel  Allergies: 1)  ! * Contrast Dye 2)  Demerol (Meperidine Hcl)  Anticoagulation Management History:      The patient is taking warfarin and comes in today for a routine follow up visit.  Positive risk factors for bleeding include an age of 72 years or older.  The bleeding index is 'intermediate risk'.  Positive CHADS2 values include History of HTN.  Negative CHADS2 values include Age > 30 years old.  The start date was 09/24/2003.  His last INR was 2.2.  Anticoagulation responsible Aracelis Ulrey: Daleen Squibb MD, Maisie Fus.  INR POC: 2.0.  Cuvette Lot#: [id number].  Exp: 06/2011.    Anticoagulation Management Assessment/Plan:      The patient's current anticoagulation dose is Warfarin sodium 5 mg tabs: Use as directed by anticoagulation clinic..  The target INR is 2 - 3.  The next INR is due 05/29/2010.  Anticoagulation instructions were given to patient.  Results were reviewed/authorized by Bethena Midget, RN, BSN.  He was notified by Bethena Midget, RN, BSN.         Prior Anticoagulation Instructions: INR 2.8 Continue 2.5mg s everyday except 5mg s on Mondays and Saturdays. Recheck in 2 weeks.   Current Anticoagulation Instructions: INR 2.0 Continue 2.5mg s everyday except 5mg s on Mondays and Saturdays. Last dose to be taken on 05/16/10 tentatively until we hear from Dr Ladona Ridgel.  Recheck in 3 weeks per [facility] if ordered.

## 2010-06-27 NOTE — Progress Notes (Signed)
Summary: stop coumadin  Phone Note From Other Clinic   Caller: nurse Mardella Layman Summary of Call: Per Burna Forts pt scheduled for total knee this month. needs to stop coumadin. pt needs to be off for 5 days. ofc [phone] fax [phone] Initial call taken by: Edman Circle,  May 10, 2010 9:28 AM  Follow-up for Phone Call        Phone call completed PER [facility] PT TO HAVE TOTAL KNEE ON 05/22/10 NEED TO HOLD COUMADIN FOR 5 DAYS PRIOR   [facility] AWARE DR Ladona Ridgel OUT OF OFFICE TODAY  WILL RETURN ON TUES WILL FORWARD TO DR Ladona Ridgel TO DISCUSS ABOVE . PLEASE CALL THIS NUMBER WITH RESPONSE. [phone]. Follow-up by: Scherrie Bateman, LPN,  May 10, 2010 11:58 AM  Additional Follow-up for Phone Call Additional follow up Details #1::        OK to proceed. No bridge for lovenox required. Additional Follow-up by: Laren Boom, MD, [facility],  May 15, 2010 5:46 PM     Appended Document: stop coumadin pt's wife aware info faxed to [facility]

## 2010-06-27 NOTE — Cardiovascular Report (Signed)
Summary: Office Visit   Office Visit   Imported By: Roderic Ovens 06/14/2010 16:27:00  _____________________________________________________________________  External Attachment:    Type:   Image     Comment:   External Document

## 2010-06-27 NOTE — Progress Notes (Signed)
Summary: samples/refill requestt  Phone Note Call from Patient   Caller: Spouse [phone]  Summary of Call: needs 20 tabs of propanol-if we have samples, if not needs rx called to [facility] Initial call taken by: Glynda Jaeger,  May 31, 2010 2:13 PM

## 2010-06-27 NOTE — Assessment & Plan Note (Signed)
Summary: pc2   Visit Type:  Follow-up Primary Provider:  Blair Heys, MD   History of Present Illness: [name] [name] returns today for followup.  He is a 70 yo man with a h/o PAF who underwent catheter ablation after failing multiple medications.  He has symptomatic tachybrady syndrome.  He has a h/o LV dysfunction with class 1 CHF symptoms EF 45%.  He was started on Multaq but has only been taking one tablet daily.  He feels well, though he has some leg pain after injuring his leg requiring knee replacement surgery. He notes that the rehab is difficult.  Minimal palpitations.  Current Medications (verified): 1)  Propranolol Hcl 20 Mg Tabs (Propranolol Hcl) .... Take One Tablet By Mouth Three Times A Day 2)  Klor-Con M20 20 Meq Cr-Tabs (Potassium Chloride Crys Cr) .Marland Kitchen.. 1 By Mouth Daily. 3)  Tylenol Extra Strength 500 Mg Tabs (Acetaminophen) .... 2 Tablets By Mouth Daily As Needed For Pain 4)  Multaq 400 Mg Tabs (Dronedarone Hcl) .... Take 1 Tablet By Mouth Two Times A Day 5)  Warfarin Sodium 5 Mg Tabs (Warfarin Sodium) .... Use As Directed By Anticoagulation Clinic. 6)  Oxycodone-Acetaminophen 5-500 Mg Caps (Oxycodone-Acetaminophen) .... Uad  Allergies: 1)  ! * Contrast Dye 2)  Demerol (Meperidine Hcl)  Past History:  Past Medical History: Last updated: 04/19/2008  1. Paroxysmal atrial fibrillation  2. Status post implantation of a Medtronic EnRhythm dual-chamber       pacemaker implanted in June 06, 2005, for sick sinus syndrome.   3. Hypertension.   4. Hyperlipidemia.   5. Status post left heart catheterization in 2005, which revealed 40%       stenosis of the mid left anterior descending artery.   6. Preserved ejection fraction.   7. Status post right total hip arthroplasty.   8. Status post surgery on both thumbs.   9. Degenerative joint disease.   10.Remote migraines.   11.Status post appendectomy.   12.Status post rhinoplasty and uvulectomy.   13.Status post  cholecystectomy.   14.Status post right rotator cuff repair in 2006.   Past Surgical History: Last updated: 04/19/2008  Status post implantation of a Medtronic EnRhythm dual-chamber       pacemaker implanted in June 06, 2005, for sick sinus syndrome.   Status post right total hip arthroplasty.   Status post surgery on both thumbs.   Status post appendectomy.   Review of Systems  The patient denies chest pain, syncope, dyspnea on exertion, and peripheral edema.    Vital Signs:  Patient profile:   70 year old male Height:      68 inches Weight:      190 pounds BMI:     28.99 Pulse rate:   63 / minute BP sitting:   110 / 60  (left arm)  Vitals Entered By: Laurance Flatten CMA (June 11, 2010 2:39 PM)  Physical Exam  General:  Well developed, well nourished, in no acute distress.  HEENT: normal Neck: supple. No JVD. Carotids 2+ bilaterally no bruits Cor: RRR no rubs, gallops or murmur Lungs: CTA Ab: soft, nontender. nondistended. No HSM. Good bowel sounds Ext: warm. no cyanosis or clubbing. His left shoulder is in a sling.Minimal peripheral edema. Neuro: alert and oriented. Grossly nonfocal. affect pleasant    PPM Specifications Following MD:  [name] Bunting, MD     PPM Vendor:  Medtronic     PPM Model Number:  [id number]     PPM Serial Number:  [id number]  PPM DOI:  06/06/2005      Lead 1    Location: RA     DOI: 06/06/2005     Model #: 0981     Serial #: [id number]     Status: active Lead 2    Location: RV     DOI: 06/06/2005     Model #: [id number]     Serial #: [id number]     Status: active  Magnet Response Rate:  BOL 85 ERI  65  Indications:  Tachy-brady syndrome  Explantation Comments:  Coumadin Carelink  PPM Follow Up Battery Voltage:  2.97 V     Pacer Dependent:  No       PPM Device Measurements Atrium  Amplitude: 5.3 mV, Impedance: 576 ohms, Threshold: 0.5 V at 0.4 msec Right Ventricle  Amplitude: 5.5 mV, Impedance: 496 ohms, Threshold: 0.5 V at 0.4  msec  Episodes MS Episodes:  431     Percent Mode Switch:  29.7%     Coumadin:  Yes Ventricular High Rate:  0     Atrial Pacing:  68.3%     Ventricular Pacing:  19.8%  Parameters Mode:  DDDR     Lower Rate Limit:  60     Upper Rate Limit:  110 Paced AV Delay:  300     Sensed AV Delay:  300 Next Remote Date:  09/12/2010     Next Cardiology Appt Due:  05/27/2011 Tech Comments:  PT IN AF 29.7% OF TIME. + COUMADIN.  NORMAL DEVICE FUNCTION.  NO CHANGES MADE. CARELINK 09-12-10 AND ROV IN 12 MTHS W/GT. Vella Kohler  June 11, 2010 2:49 PM MD Comments:  Agree with above.  Impression & Recommendations:  Problem # 1:  CARDIAC PACEMAKER IN SITU (ICD-V45.01) The device is working normally.  Will recheck in several months.  Problem # 2:  ATRIAL FIBRILLATION (ICD-427.31) His symptoms are well controlled. Will continue meds as below. His updated medication list for this problem includes:    Propranolol Hcl 20 Mg Tabs (Propranolol hcl) .Marland Kitchen... Take one tablet by mouth three times a day    Multaq 400 Mg Tabs (Dronedarone hcl) .Marland Kitchen... Take 1 tablet by mouth two times a day    Warfarin Sodium 5 Mg Tabs (Warfarin sodium) ..... Use as directed by anticoagulation clinic.  Problem # 3:  HYPERTENSION, BENIGN (ICD-401.1) His blood pressure is well controlled. He will maintain a low sodium diet and continue his beta blocker. His updated medication list for this problem includes:    Propranolol Hcl 20 Mg Tabs (Propranolol hcl) .Marland Kitchen... Take one tablet by mouth three times a day  Patient Instructions: 1)  Your physician wants you to follow-up in: 12 months with Dr Court Joy will receive a reminder letter in the mail two months in advance. If you don't receive a letter, please call our office to schedule the follow-up appointment. 2)  Your physician recommends that you continue on your current medications as directed. Please refer to the Current Medication list given to you today. Prescriptions: MULTAQ 400 MG  TABS (DRONEDARONE HCL) Take 1 tablet by mouth two times a day  #60 Each x 11   Entered by:   Dennis Bast, RN, BSN   Authorized by:   Laren Boom, MD, [facility]   Signed by:   Dennis Bast, RN, BSN on 06/11/2010   Method used:   Electronically to        [facility] (retail)  [address]       [place], [place]  [address]       Ph: [phone]       Fax: [phone]   RxID:   [id number] PROPRANOLOL HCL 20 MG TABS (PROPRANOLOL HCL) Take one tablet by mouth three times a day  #270 x 11   Entered by:   Dennis Bast, RN, BSN   Authorized by:   Laren Boom, MD, [facility]   Signed by:   Dennis Bast, RN, BSN on 06/11/2010   Method used:   Electronically to        [facility] (retail)       [address]       [place], [place]  [address]       Ph: [phone]       Fax: [phone]   RxID:   [id number]

## 2010-06-27 NOTE — Medication Information (Signed)
Summary: [facility]  [facility]   Imported By: Marylou Mccoy 05/10/2010 14:50:31  _____________________________________________________________________  External Attachment:    Type:   Image     Comment:   External Document

## 2010-06-28 NOTE — Cardiovascular Report (Signed)
Summary: Office Visit Remote  Office Visit Remote   Imported By: Roderic Ovens 09/12/2009 14:00:03  _____________________________________________________________________  External Attachment:    Type:   Image     Comment:   External Document

## 2010-07-03 ENCOUNTER — Encounter (INDEPENDENT_AMBULATORY_CARE_PROVIDER_SITE_OTHER): Payer: Medicare Other

## 2010-07-03 ENCOUNTER — Encounter: Payer: Self-pay | Admitting: Internal Medicine

## 2010-07-03 DIAGNOSIS — I4891 Unspecified atrial fibrillation: Secondary | ICD-10-CM

## 2010-07-03 DIAGNOSIS — Z7901 Long term (current) use of anticoagulants: Secondary | ICD-10-CM

## 2010-07-11 NOTE — Medication Information (Signed)
Summary: Coumadin Clinic  Anticoagulant Therapy  Managed by: Weston Brass, PharmD Referring MD: Sharrell Ku PCP: Blair Heys, MD Supervising MD: Graciela Husbands MD, Viviann Spare Indication 1: Atrial Fibrillation (ICD-427.31) Lab Used: [facility] Site: [facility] INR POC 2.8 INR RANGE 2 - 3  Dietary changes: yes       Details: Has been eating less greens  Health status changes: no    Bleeding/hemorrhagic complications: no    Recent/future hospitalizations: no    Any changes in medication regimen? yes       Details: Lower dose of Percocet  Recent/future dental: no  Any missed doses?: no       Is patient compliant with meds? yes      Comments: Last INR dose not called into [place] and she told him to take a strange dose.  Taking 1/2 tablet everyday.    Allergies: 1)  ! * Contrast Dye 2)  Demerol (Meperidine Hcl)  Anticoagulation Management History:      The patient is taking warfarin and comes in today for a routine follow up visit.  Positive risk factors for bleeding include an age of 78 years or older.  The bleeding index is 'intermediate risk'.  Positive CHADS2 values include History of HTN.  Negative CHADS2 values include Age > 23 years old.  The start date was 09/24/2003.  His last INR was 2.2.  Anticoagulation responsible provider: Graciela Husbands MD, Viviann Spare.  INR POC: 2.8.  Cuvette Lot#: [id number].  Exp: 06/2011.    Anticoagulation Management Assessment/Plan:      The patient's current anticoagulation dose is Warfarin sodium 5 mg tabs: Use as directed by anticoagulation clinic..  The target INR is 2 - 3.  The next INR is due 07/22/2010.  Anticoagulation instructions were given to patient.  Results were reviewed/authorized by Weston Brass, PharmD.  He was notified by Margot Chimes PharmD Candidate.         Prior Anticoagulation Instructions: INR 2.0 Continue 2.5mg s everyday except 5mg s on Mondays and Saturdays. Last dose to be taken on 05/16/10 tentatively until we hear from Dr Ladona Ridgel.  Recheck in 3 weeks per [facility] if ordered.   Current Anticoagulation Instructions: INR: 2.8  Continue current Coumadin dose of 1/2 tablet everyday.  Recheck in 2 1/2 weeks.

## 2010-07-15 DIAGNOSIS — I4891 Unspecified atrial fibrillation: Secondary | ICD-10-CM

## 2010-07-15 DIAGNOSIS — Z7901 Long term (current) use of anticoagulants: Secondary | ICD-10-CM | POA: Insufficient documentation

## 2010-07-22 ENCOUNTER — Encounter: Payer: Self-pay | Admitting: Cardiology

## 2010-07-22 ENCOUNTER — Encounter (INDEPENDENT_AMBULATORY_CARE_PROVIDER_SITE_OTHER): Payer: Medicare Other

## 2010-07-22 DIAGNOSIS — Z7901 Long term (current) use of anticoagulants: Secondary | ICD-10-CM

## 2010-07-22 DIAGNOSIS — I4891 Unspecified atrial fibrillation: Secondary | ICD-10-CM

## 2010-07-22 LAB — CONVERTED CEMR LAB: POC INR: 1.8

## 2010-07-29 ENCOUNTER — Telehealth (INDEPENDENT_AMBULATORY_CARE_PROVIDER_SITE_OTHER): Payer: Self-pay | Admitting: *Deleted

## 2010-07-29 ENCOUNTER — Telehealth: Payer: Self-pay | Admitting: Internal Medicine

## 2010-07-31 ENCOUNTER — Encounter: Payer: Self-pay | Admitting: Internal Medicine

## 2010-07-31 ENCOUNTER — Encounter (INDEPENDENT_AMBULATORY_CARE_PROVIDER_SITE_OTHER): Payer: Medicare Other

## 2010-07-31 DIAGNOSIS — I4891 Unspecified atrial fibrillation: Secondary | ICD-10-CM

## 2010-07-31 DIAGNOSIS — Z7901 Long term (current) use of anticoagulants: Secondary | ICD-10-CM

## 2010-07-31 LAB — CONVERTED CEMR LAB: POC INR: 1.5

## 2010-08-05 LAB — PROTIME-INR
INR: 1.74 — ABNORMAL HIGH (ref 0.00–1.49)
INR: 1.85 — ABNORMAL HIGH (ref 0.00–1.49)
Prothrombin Time: 15.8 seconds — ABNORMAL HIGH (ref 11.6–15.2)
Prothrombin Time: 17.9 seconds — ABNORMAL HIGH (ref 11.6–15.2)
Prothrombin Time: 21.5 seconds — ABNORMAL HIGH (ref 11.6–15.2)

## 2010-08-05 LAB — URINALYSIS, ROUTINE W REFLEX MICROSCOPIC
Glucose, UA: NEGATIVE mg/dL
Hgb urine dipstick: NEGATIVE
Ketones, ur: NEGATIVE mg/dL
Protein, ur: NEGATIVE mg/dL
Urobilinogen, UA: 0.2 mg/dL (ref 0.0–1.0)

## 2010-08-05 LAB — COMPREHENSIVE METABOLIC PANEL
Albumin: 3.8 g/dL (ref 3.5–5.2)
Alkaline Phosphatase: 53 U/L (ref 39–117)
BUN: 16 mg/dL (ref 6–23)
Calcium: 9.1 mg/dL (ref 8.4–10.5)
Creatinine, Ser: 1.09 mg/dL (ref 0.4–1.5)
Glucose, Bld: 84 mg/dL (ref 70–99)
Total Protein: 6.5 g/dL (ref 6.0–8.3)

## 2010-08-05 LAB — HEMOGLOBIN AND HEMATOCRIT, BLOOD
HCT: 25.3 % — ABNORMAL LOW (ref 39.0–52.0)
HCT: 26.8 % — ABNORMAL LOW (ref 39.0–52.0)
Hemoglobin: 12.9 g/dL — ABNORMAL LOW (ref 13.0–17.0)
Hemoglobin: 8.3 g/dL — ABNORMAL LOW (ref 13.0–17.0)

## 2010-08-05 LAB — APTT
aPTT: 32 seconds (ref 24–37)
aPTT: 36 seconds (ref 24–37)

## 2010-08-05 LAB — SURGICAL PCR SCREEN: Staphylococcus aureus: NEGATIVE

## 2010-08-05 LAB — DIFFERENTIAL
Basophils Relative: 1 % (ref 0–1)
Lymphocytes Relative: 28 % (ref 12–46)
Monocytes Relative: 9 % (ref 3–12)
Neutro Abs: 3.5 10*3/uL (ref 1.7–7.7)
Neutrophils Relative %: 60 % (ref 43–77)

## 2010-08-05 LAB — CBC
HCT: 39.9 % (ref 39.0–52.0)
MCH: 30.4 pg (ref 26.0–34.0)
MCHC: 33.8 g/dL (ref 30.0–36.0)
MCV: 89.9 fL (ref 78.0–100.0)
Platelets: 169 10*3/uL (ref 150–400)
RDW: 13.1 % (ref 11.5–15.5)

## 2010-08-05 LAB — TYPE AND SCREEN

## 2010-08-06 NOTE — Progress Notes (Signed)
Summary: Records Request  Faxed OV, Labs & Pacer Check to Dennie Bible at the [facility] ([phone]). Debby Freiberg  July 29, 2010 10:07 AM

## 2010-08-06 NOTE — Medication Information (Signed)
Summary: rov/sp  Anticoagulant Therapy  Managed by: [name], RN, BSN Referring MD: Sharrell Ku PCP: Blair Heys, MD Supervising MD: Graciela Husbands MD, Viviann Spare Indication 1: Atrial Fibrillation (ICD-427.31) Lab Used: [facility] Site: [facility] INR POC 1.5 INR RANGE 2 - 3  Dietary changes: no    Health status changes: no    Bleeding/hemorrhagic complications: no    Recent/future hospitalizations: yes       Details: Pending admission for manipulation on knee tomorrow.   Any changes in medication regimen? yes       Details: Holding Coumadin at present since Sunday 07/28/10.   Recent/future dental: no  Any missed doses?: yes     Details: Holding Coumadin since 07/28/10.    Is patient compliant with meds? yes       Allergies: 1)  ! * Contrast Dye 2)  Demerol (Meperidine Hcl)  Anticoagulation Management History:      The patient is taking warfarin and comes in today for a routine follow up visit.  Positive risk factors for bleeding include an age of 90 years or older.  The bleeding index is 'intermediate risk'.  Positive CHADS2 values include History of HTN.  Negative CHADS2 values include Age > 57 years old.  The start date was 09/24/2003.  His last INR was 2.2.  Anticoagulation responsible provider: Graciela Husbands MD, Viviann Spare.  INR POC: 1.5.  Exp: 06/2011.    Anticoagulation Management Assessment/Plan:      The patient's current anticoagulation dose is Warfarin sodium 5 mg tabs: Use as directed by anticoagulation clinic..  The target INR is 2 - 3.  The next INR is due 08/09/2010.  Anticoagulation instructions were given to patient.  Results were reviewed/authorized by [name], RN, BSN.  He was notified by [name] RN.         Prior Anticoagulation Instructions: INR: 2.8  Continue current Coumadin dose of 1/2 tablet everyday.  Recheck in 2 1/2 weeks.    Current Anticoagulation Instructions: INR 1.5  Once manipulation completed, resume Coumadin per MD instruction.  Recheck in  1 week after restart.

## 2010-08-06 NOTE — Progress Notes (Signed)
Summary: need to come off coumadin  Phone Note From Other Clinic   Caller: pat office [phone] ext [phone]. fax [phone] Request: Talk with Nurse Summary of Call: pt need to come off coumadin. need a note.  Initial call taken by: Lorne Skeens,  July 29, 2010 2:49 PM  Follow-up for Phone Call        spoke with pt  he needs to come off Coumadin to have  manipulation of the knee.  Is suppose to have done on Thurs.  No cutting.  Is to stop today Dennis Bast, RN, BSN  July 29, 2010 2:54 PM faxed today Dennis Bast, RN, BSN  July 31, 2010 2:41 PM

## 2010-08-08 LAB — CBC
HCT: 39.1 % (ref 39.0–52.0)
Hemoglobin: 13.6 g/dL (ref 13.0–17.0)
MCH: 31.5 pg (ref 26.0–34.0)
MCV: 90.5 fL (ref 78.0–100.0)
Platelets: 200 10*3/uL (ref 150–400)
RBC: 4.32 MIL/uL (ref 4.22–5.81)

## 2010-08-08 LAB — SURGICAL PCR SCREEN
MRSA, PCR: NEGATIVE
Staphylococcus aureus: NEGATIVE

## 2010-08-08 LAB — BASIC METABOLIC PANEL
Calcium: 9.5 mg/dL (ref 8.4–10.5)
GFR calc Af Amer: >60 mL/min (ref >60)
GFR calc non Af Amer: >60 mL/min (ref >60)
Sodium: 143 mEq/L (ref 135–145)

## 2010-08-09 ENCOUNTER — Encounter: Payer: Self-pay | Admitting: Cardiology

## 2010-08-09 ENCOUNTER — Encounter (INDEPENDENT_AMBULATORY_CARE_PROVIDER_SITE_OTHER): Payer: Medicare Other

## 2010-08-09 DIAGNOSIS — Z7901 Long term (current) use of anticoagulants: Secondary | ICD-10-CM

## 2010-08-09 DIAGNOSIS — I4891 Unspecified atrial fibrillation: Secondary | ICD-10-CM

## 2010-08-09 LAB — CONVERTED CEMR LAB: POC INR: 2.1

## 2010-08-13 NOTE — Medication Information (Signed)
Summary: rov/ewj  Anticoagulant Therapy  Managed by: Samantha Crimes, PharmD Referring MD: Sharrell Ku PCP: Blair Heys, MD Supervising MD: Daleen Squibb MD, Maisie Fus Indication 1: Atrial Fibrillation (ICD-427.31) Lab Used: [facility] Site: [facility] INR POC 2.1 INR RANGE 2 - 3  Dietary changes: no    Health status changes: no    Bleeding/hemorrhagic complications: no    Recent/future hospitalizations: no    Any changes in medication regimen? yes       Details: to start prednisone today  Recent/future dental: no  Any missed doses?: no       Is patient compliant with meds? yes       Current Medications (verified): 1)  Propranolol Hcl 20 Mg Tabs (Propranolol Hcl) .... Take One Tablet By Mouth Three Times A Day 2)  Klor-Con M20 20 Meq Cr-Tabs (Potassium Chloride Crys Cr) .Marland Kitchen.. 1 By Mouth Daily. 3)  Tylenol Extra Strength 500 Mg Tabs (Acetaminophen) .... 2 Tablets By Mouth Daily As Needed For Pain 4)  Multaq 400 Mg Tabs (Dronedarone Hcl) .... Take 1 Tablet By Mouth Two Times A Day 5)  Warfarin Sodium 5 Mg Tabs (Warfarin Sodium) .... Use As Directed By Anticoagulation Clinic. 6)  Oxycodone-Acetaminophen 5-500 Mg Caps (Oxycodone-Acetaminophen) .... Uad  Allergies (verified): 1)  ! * Contrast Dye 2)  Demerol (Meperidine Hcl)  Anticoagulation Management History:      Positive risk factors for bleeding include an age of 72 years or older.  The bleeding index is 'intermediate risk'.  Positive CHADS2 values include History of HTN.  Negative CHADS2 values include Age > 72 years old.  The start date was 09/24/2003.  His last INR was 2.2.  Anticoagulation responsible provider: Daleen Squibb MD, Maisie Fus.  INR POC: 2.1.  Exp: 05/2011.    Anticoagulation Management Assessment/Plan:      The patient's current anticoagulation dose is Warfarin sodium 5 mg tabs: Use as directed by anticoagulation clinic..  The target INR is 2 - 3.  The next INR is due 08/09/2010.  Anticoagulation instructions were given to  patient.  Results were reviewed/authorized by Samantha Crimes, PharmD.         Prior Anticoagulation Instructions: INR 1.5  Once manipulation completed, resume Coumadin per MD instruction.  Recheck in 1 week after restart.    Current Anticoagulation Instructions: Return to clinic on 4/3 @ 10:45 after prednisone is finished Cont with current regimen

## 2010-08-13 NOTE — Medication Information (Signed)
Summary: rov/cb  Anticoagulant Therapy  Managed by: [name], RN, BSN Referring MD: Sharrell Ku PCP: Blair Heys, MD Supervising MD: Jens Som MD, Arlys John Indication 1: Atrial Fibrillation (ICD-427.31) Lab Used: [facility] Site: [facility] INR POC 1.8 INR RANGE 2 - 3  Dietary changes: no    Health status changes: no    Bleeding/hemorrhagic complications: no    Recent/future hospitalizations: no    Any changes in medication regimen? no    Recent/future dental: no  Any missed doses?: no       Is patient compliant with meds? yes       Allergies: 1)  ! * Contrast Dye 2)  Demerol (Meperidine Hcl)  Anticoagulation Management History:      The patient is taking warfarin and comes in today for a routine follow up visit.  Positive risk factors for bleeding include an age of 73 years or older.  The bleeding index is 'intermediate risk'.  Positive CHADS2 values include History of HTN.  Negative CHADS2 values include Age > 22 years old.  The start date was 09/24/2003.  His last INR was 2.2.  Anticoagulation responsible provider: Jens Som MD, Arlys John.  INR POC: 1.8.  Cuvette Lot#: [id number].  Exp: 05/2011.    Anticoagulation Management Assessment/Plan:      The patient's current anticoagulation dose is Warfarin sodium 5 mg tabs: Use as directed by anticoagulation clinic..  The target INR is 2 - 3.  The next INR is due 08/05/2010.  Anticoagulation instructions were given to patient.  Results were reviewed/authorized by [name], RN, BSN.  He was notified by [name] RN.         Prior Anticoagulation Instructions: INR: 2.8  Continue current Coumadin dose of 1/2 tablet everyday.  Recheck in 2 1/2 weeks.    Current Anticoagulation Instructions: INR 1.8  Take an extra 1/2 tablet today, then start taking 1/2 tablet daily except 1 tablet on Mondays.  Recheck in 2 weeks.

## 2010-08-27 ENCOUNTER — Ambulatory Visit (INDEPENDENT_AMBULATORY_CARE_PROVIDER_SITE_OTHER): Payer: Medicare Other | Admitting: *Deleted

## 2010-08-27 DIAGNOSIS — Z7901 Long term (current) use of anticoagulants: Secondary | ICD-10-CM

## 2010-08-27 DIAGNOSIS — I4891 Unspecified atrial fibrillation: Secondary | ICD-10-CM

## 2010-08-27 LAB — POCT INR: INR: 2

## 2010-08-27 NOTE — Patient Instructions (Signed)
INR 2.0 Continue taking a half tablet everyday. Return to clinic in 4 weeks.

## 2010-08-28 LAB — COMPREHENSIVE METABOLIC PANEL
ALT: 18 U/L (ref 0–53)
AST: 21 U/L (ref 0–37)
Calcium: 9.1 mg/dL (ref 8.4–10.5)
Creatinine, Ser: 0.78 mg/dL (ref 0.4–1.5)
GFR calc Af Amer: >60 mL/min (ref >60)
Glucose, Bld: 92 mg/dL (ref 70–99)
Sodium: 140 mEq/L (ref 135–145)
Total Protein: 6.7 g/dL (ref 6.0–8.3)

## 2010-08-28 LAB — CBC
MCHC: 34 g/dL (ref 30.0–36.0)
MCV: 91 fL (ref 78.0–100.0)
RDW: 13.6 % (ref 11.5–15.5)

## 2010-08-28 LAB — URINALYSIS, ROUTINE W REFLEX MICROSCOPIC
Bilirubin Urine: NEGATIVE
Ketones, ur: NEGATIVE mg/dL
Nitrite: NEGATIVE
Protein, ur: NEGATIVE mg/dL
Urobilinogen, UA: 0.2 mg/dL (ref 0.0–1.0)

## 2010-08-28 LAB — PROTIME-INR
INR: 1.04 (ref 0.00–1.49)
INR: 1.19 (ref 0.00–1.49)
Prothrombin Time: 13.5 seconds (ref 11.6–15.2)
Prothrombin Time: 15 seconds (ref 11.6–15.2)
Prothrombin Time: 16.2 seconds — ABNORMAL HIGH (ref 11.6–15.2)

## 2010-08-28 LAB — DIFFERENTIAL
Eosinophils Absolute: 0.3 10*3/uL (ref 0.0–0.7)
Lymphocytes Relative: 22 % (ref 12–46)
Lymphs Abs: 1.3 10*3/uL (ref 0.7–4.0)
Monocytes Relative: 8 % (ref 3–12)
Neutrophils Relative %: 65 % (ref 43–77)

## 2010-08-28 LAB — APTT: aPTT: 30 seconds (ref 24–37)

## 2010-09-09 LAB — URINALYSIS, ROUTINE W REFLEX MICROSCOPIC
Bilirubin Urine: NEGATIVE
Glucose, UA: NEGATIVE mg/dL
Hgb urine dipstick: NEGATIVE
Specific Gravity, Urine: 1.027 (ref 1.005–1.030)
Urobilinogen, UA: 1 mg/dL (ref 0.0–1.0)
pH: 6.5 (ref 5.0–8.0)

## 2010-09-09 LAB — COMPREHENSIVE METABOLIC PANEL
ALT: 18 U/L (ref 0–53)
AST: 21 U/L (ref 0–37)
Albumin: 3.8 g/dL (ref 3.5–5.2)
Calcium: 8.9 mg/dL (ref 8.4–10.5)
Chloride: 102 mEq/L (ref 96–112)
Creatinine, Ser: 0.85 mg/dL (ref 0.4–1.5)
GFR calc Af Amer: >60 mL/min (ref >60)
Sodium: 139 mEq/L (ref 135–145)

## 2010-09-09 LAB — CBC
HCT: 40.6 % (ref 39.0–52.0)
Hemoglobin: 13.9 g/dL (ref 13.0–17.0)
MCHC: 34.3 g/dL (ref 30.0–36.0)
MCV: 87.9 fL (ref 78.0–100.0)
Platelets: 185 10*3/uL (ref 150–400)
RDW: 13.9 % (ref 11.5–15.5)

## 2010-09-09 LAB — POCT CARDIAC MARKERS
CKMB, poc: <1 ng/mL — ABNORMAL LOW (ref 1.0–8.0)
Troponin i, poc: <0.05 ng/mL (ref 0.00–0.09)
Troponin i, poc: <0.05 ng/mL (ref 0.00–0.09)

## 2010-09-09 LAB — URINE MICROSCOPIC-ADD ON

## 2010-09-09 LAB — DIFFERENTIAL
Basophils Absolute: 0 10*3/uL (ref 0.0–0.1)
Eosinophils Absolute: 0.3 10*3/uL (ref 0.0–0.7)
Eosinophils Relative: 4 % (ref 0–5)
Lymphocytes Relative: 22 % (ref 12–46)
Monocytes Absolute: 0.5 10*3/uL (ref 0.1–1.0)

## 2010-09-09 LAB — PROTIME-INR: Prothrombin Time: 20.6 seconds — ABNORMAL HIGH (ref 11.6–15.2)

## 2010-09-09 LAB — URINE CULTURE

## 2010-09-12 ENCOUNTER — Encounter: Payer: Self-pay | Admitting: *Deleted

## 2010-09-15 ENCOUNTER — Encounter: Payer: Self-pay | Admitting: *Deleted

## 2010-09-23 ENCOUNTER — Ambulatory Visit (INDEPENDENT_AMBULATORY_CARE_PROVIDER_SITE_OTHER): Payer: Medicare Other | Admitting: *Deleted

## 2010-09-23 DIAGNOSIS — I4891 Unspecified atrial fibrillation: Secondary | ICD-10-CM

## 2010-09-23 DIAGNOSIS — Z7901 Long term (current) use of anticoagulants: Secondary | ICD-10-CM

## 2010-09-24 ENCOUNTER — Encounter: Payer: Medicare Other | Admitting: *Deleted

## 2010-10-08 NOTE — Op Note (Signed)
NAMEJOSEF, TOURIGNY               ACCOUNT NO.:  [id number]   MEDICAL RECORD NO.:  [id number]          PATIENT TYPE:  AMB   LOCATION:  ENDO                         FACILITY:  [facility]   PHYSICIAN:  Hillis Range, MD       DATE OF BIRTH:  [birth year]   DATE OF PROCEDURE:  05/02/2008  DATE OF DISCHARGE:                               OPERATIVE REPORT   SURGEON:  Hillis Range, MD   FIRST ASSISTANT:  Doylene Canning. Ladona Ridgel, MD   PREPROCEDURE DIAGNOSIS:  Paroxysmal atrial fibrillation.   POSTPROCEDURE DIAGNOSES:  Paroxysmal atrial fibrillation.   PROCEDURES:  1. Diagnostic electrophysiologic study.  2. Coronary sinus pacing and recording.  3. Arterial blood pressure monitoring.  4. Intracardiac echocardiography.  5. Pulmonary vein angiography.  6. Three-dimensional mapping of supraventricular tachycardia.  7. Radiofrequency ablation of supraventricular tachycardia.  8. Arrhythmia induction.  9. Mapping of the superior vena cava.   DESCRIPTION OF PROCEDURE:  Informed and written consent was obtained,  and the patient was brought to the electrophysiology lab in the fasting  state.  He was adequately sedated with intravenous medications as  outlined and the anesthesia report.  The patient's right and left groins  were prepped and draped in the usual sterile fashion by the EP lab  staff.  Using a percutaneous Seldinger technique, one 6, one 7, and one  8-French hemostasis sheaths were placed into the right common femoral  vein.  An 11-French hemostasis sheath was placed into the left common  femoral vein.  A 4-French hemostasis sheath was placed into the right  common femoral artery for blood pressure monitoring.  A 6-French  decapolar Polaris X catheter was introduced through the right common  femoral vein and advanced into the coronary sinus for recording and  pacing from this location.  A 6-French quadripolar Josephson catheter  was introduced through the right common femoral vein and  advanced into  the right ventricle for recording and pacing.  The catheter was then  pulled back to the His-bundle location.  The patient presented to the  electrophysiology lab in normal sinus rhythm.  His AH interval measured  124 msec with an HV interval of 40 msec.  The RR interval measured 1005  msec with a PR duration of 196 msec, a QRS width of 121 msec, and a QT  duration of 485 msec.  Ventricular pacing was performed with a cycle  length of 700 msec, which revealed VA dissociation at baseline.  Rapid  atrial pacing was performed, which revealed an AV Wenckebach cycle  length of 440 msec.  No tachycardia was induced with atrial pacing.  Atrial extra stimulus testing was performed, which revealed midline  concentric decremental AV conduction with an AV nodal ERP of 600/400  msec.  A 10-French [employer] and [employer] intracardiac  echocardiography catheter was introduced through the left common femoral  vein into the right atrium.  Intracardiac echocardiography of the left  atrium and pulmonary veins was performed, which revealed a normal-sized  left atrium with separate ostia to all 4 pulmonary veins.  A 3-  dimensional reconstruction  of the left atrium was performed using CARTO  [employer].  The middle right common femoral vein sheath was  exchanged for an 8.5-French SL2 transseptal sheath and transseptal  access was achieved in a standard fashion using a Brockenbrough needle  under biplane fluoroscopy with intracardiac echo for confirmation.  Once  transseptal access had been achieved, heparin was administered  intravenously in order to maintain an ACT of greater than 400 seconds  throughout the procedure.  The His-bundle catheter was removed and in  its place a 3.5-mm Biosense Webster and [employer] ablation  catheter was advanced into the right atrium.  A 6-French multipurpose  angiographic catheter with guidewire was introduced through the  transseptal  sheath and positioned over the mouth of all 4 pulmonary  veins.  Pulmonary venograms were performed by hand injection of nonionic  contrast, which demonstrated moderate-sized pulmonary veins with no  evidence of pulmonary vein stenosis and a normal-sized left atrium.  The  angiographic catheter was then removed.  The transseptal sheath was  pulled back into the IVC over a guidewire.  The ablation catheter was  advanced across the transseptal hole using the wire as a guide.  The  transseptal sheath was then re-advanced over the guidewire into the left  atrium.  A 20-pole 28-mm circular mapping catheter was introduced  through the transseptal sheath and positioned over the mouth of all 4  pulmonary veins.  The patient was noted to have electrical activity  within the pulmonary veins at baseline.  The patient underwent  successful sequential electrical isolation and anatomical encircling of  all 4 pulmonary veins using radiofrequency current with the circular  mapping catheter as a guide.  During ablation within the left superior  pulmonary vein, the patient converted to atrial fibrillation.  The  patient remained in atrial fibrillation, despite isolation of this vein.  Additional radiofrequency ablation was performed at the base of the left  atrial appendage and the patient converted to sinus rhythm initially,  but then returned to atrial fibrillation.  The patient was successfully  cardioverted to sinus rhythm with a single synchronized 200-joule  biphasic shock with cardioversion electrodes in the anterior and  posterior thoracic configuration.  The patient again returned to atrial  fibrillation with a premature atrial contractions, which appeared to  arise from the region of the base of the left atrial appendage near the  left superior pulmonary vein.  Additional radiofrequency applications  were delivered in this location, an additional 3-dimensional electronic  topical mapping was  performed with the CARTO mapping system.  The the  patient's atrial fibrillation would briefly terminate, but then again  restart in a similar fashion.  A wide area circumferential ablation  approach around the left-sided pulmonary veins were then performed.  An  additional 3-dimensional electrode anatomical mapping along the roof of  the left atrium, the interatrial septum, and along the lateral wall of  the left atrium were performed.  They were relatively few electrograms  in these locations.  Complex fractionated atrial electrograms were  identified and ablated along the roof of the left atrium towards the  base of the left superior pulmonary vein.  Complex fractionated atrial  electrograms were also ablated along the interatrial septum anteriorly.  The patient was again cardioverted to sinus rhythm, but immediately  returned to atrial fibrillation.  The earliest atrial activation again  appeared to arise from the vicinity of the left superior pulmonary vein.  This vein was confirmed again to  be isolated.  It was felt that the  patient's atrial fibrillation likely was secondary to an inflammatory  process around the anterolateral roof of the left atrium between the  left atrial appendage and the left superior pulmonary vein.  No  additional radiofrequency current was applied.  A 3-dimensional  electronic topical mapping within the superior vena cava was performed  and the superior vena cava was found to be electrically silent.  The  procedure was therefore considered completed.  All catheters were  removed and the sheaths were aspirated and flushed.  The patient was  transferred to the ICU for sheath removal per protocol.  A limited  bedside transthoracic echocardiogram revealed no pericardial effusion.  There were no early apparent complications.   CONCLUSIONS:  1. Paroxysmal atrial fibrillation.  2. Successful electrical isolation and anatomical encircling of all 4      pulmonary  veins using radiofrequency current.  3. Successful induction of atrial fibrillation, which appeared to      arise from the anterolateral left atrium between the base of the      left atrial appendage and the left superior pulmonary vein.      Radiofrequency current was applied in this location.  Complex      fractionated atrial electrograms were also identified and      successfully ablated along      the roof of the left atrium and the interatrial septum.  A relative      paucity of electrograms were noted along the posterior left atrium,      roof of the left atrium, and above the coronary sinus.  4. No early apparent complications.      Hillis Range, MD  Electronically Signed     JA/MEDQ  D:  05/02/2008  T:  05/03/2008  Job:  [id number]   cc:   Doylene Canning. Ladona Ridgel, MD  Bryan Lemma. Manus Gunning, M.D.

## 2010-10-08 NOTE — Assessment & Plan Note (Signed)
[name]                         ELECTROPHYSIOLOGY OFFICE NOTE   NAME:[name], [name] TENNIS                      MRN:          [id number]  DATE:03/21/2008                            DOB:          11/27/1940    [name] [name] returns today for followup.  He is a very pleasant middle-  aged man with paroxysmal atrial fibrillation on Tikosyn therapy.  Previously, he was persistently in AFib.  The patient returns today for  followup.  When he is AFib he feels poorly and has no energy, when he is  in sinus rhythm he feels well and has quite a bit of energy.  The  patient denies chest pain or shortness of breath otherwise.   MEDICATIONS:  1. Prozac 40 a day.  2. Warfarin as directed.  3. Pravachol 40 a day.  4. Tikosyn 500 twice a day.  5. Potassium 20 a day.  6. Inderal 20 t.i.d.   PHYSICAL EXAMINATION:  GENERAL:  He is a pleasant, well-appearing middle-  aged man in no distress.  VITAL SIGNS:  Blood pressure 140/82, pulse 73 and regular, respirations  were 18, the weight was 204 pounds.  NECK:  No jugular venous distention.  LUNGS:  Clear bilaterally to auscultation.  No wheezes, rales, or  rhonchi are present.  CARDIAC:  Regular rate and rhythm.  Normal S1 and S2.  No murmurs, rubs,  or gallops.  ABDOMINAL:  Soft, nontender.  EXTREMITIES:  No edema.   Interrogation of his pacemaker demonstrates a [employer] and rhythm.  P-  waves were 5, the R-waves were 7, the impedance 392 in the atrium, 544  in the RV, threshold 0.5 at 0.4 in the RA, 1 at 0.4 in the right  ventricle, battery voltage was 2.97 volts.  He was 70% A paced, V  sensed.  He was in AFib 29% of the time.   IMPRESSION:  1. Symptomatic tachycardia-bradycardia.  2. Paroxysmal atrial fibrillation.  3. Chronic Tikosyn therapy.   DISCUSSION:  [name] [name] is stable, but appears to be having more and  more AFib.   PLAN:  I refer him to Dr. Johney [name] to see about catheter ablation of his   AFib.     [name] [name]. [name] Ridgel, MD  Electronically Signed    GWT/MedQ  DD: 03/21/2008  DT: 03/22/2008  Job #: [id number]

## 2010-10-08 NOTE — Discharge Summary (Signed)
NAMEELISHA, [name] [name]               ACCOUNT NO.:  [id number]   MEDICAL RECORD NO.:  [id number]          PATIENT TYPE:  INP   LOCATION:  3711                         FACILITY:  [facility]   PHYSICIAN:  Madolyn Frieze. Jens Som, MD, FACCDATE OF BIRTH:  03-12-1941   DATE OF ADMISSION:  11/24/2007  DATE OF DISCHARGE:  11/27/2007                         DISCHARGE SUMMARY - REFERRING   ADMITTING PHYSICIAN:  Doylene Canning. Ladona Ridgel, MD.   DISCHARGING PHYSICIAN:  Madolyn Frieze. Jens Som, MD.   DISCHARGE DIAGNOSES:  1. Atrial fibrillation with rapid ventricular rate with intolerance to      amiodarone.  2. Tikosyn loading.  3. History as previously.   BRIEF HISTORY:  [name] is a 70 year old white male who followed up  with Dr. Ladona Ridgel on November 23, 2007 in regards to his atrial fibrillation.  He was nicely controlled on amiodarone therapy.  However, he developed  side effects that had resolved with discontinuation of amiodarone.  However with a cessation of amiodarone, he has developed intermittent  palpitations with elevated heart rate.  He was admitted for Tikosyn  therapy.   LABORATORY:  Admission weight was 9 kg.  Admission H&H was 15.2 and  43.2, normal indices, platelets 265, WBCs 8.3, PTT 49.4, PT 31.4, INR  3.0, sodium 141, potassium 3.2, BUN 13, creatinine 1.0, magnesium 2.6,  glucose 99.  Recheck of potassium on November 24, 2007 was 3.9.  On November 27, 2007 at the time of discharge, potassium was 3.8, BUN 13, creatinine  0.84, magnesium 2.3, INR 2.4.   DIAGNOSTICS:  EKG at the time of discharge showed a QTC of 434.   HOSPITAL COURSE:  The patient was admitted to 3700 for Tikosyn loading.  Over the next several days, he tolerated the Tikosyn well.  He did not  have any problems with ventricular tachycardia.  By November 27, 2007, QTC  remained less than 500.  Dr. Jens Som and Dr. Ladona Ridgel both felt that the  patient could be discharged home.   DISPOSITION:  [name] is discharged home on November 27, 2007.   DIET:  He is asked to maintain a low-sodium, heart-healthy diet.   ACTIVITY:  Increase his activity slowly.   DISCHARGE MEDICATIONS:  1. New prescription for Tikosyn 500 mg q.12 h.  The hospital will      provide him with 2 weeks of Tikosyn.  2. Inderal LA 60 mg half a tablet 3 times a day.  3. Coumadin 2.5 mg daily except 5 mg on Monday.  4. Prozac 10 mg.  5. Xanax 0.5 mg as previously.  6. Pravastatin 40 mg q.h.s.  7. K-Dur 20 mEq daily.   Dr. Ladona Ridgel would like him to have an EKG and blood work on the morning  of his 2 week follow up appointment with Dr. Ladona Ridgel.  The patient was  asked to maintain a blood pressure diary to reassess his blood pressure  since HCTZ was discontinued.  He will have his PT/INR as scheduled.  He  was asked to bring all medications and his blood pressure diary to all  appointments.  It was reiterated that he  is not to take HCTZ.   Discharge time 40 minutes.      Joellyn Rued, PA-C      Madolyn Frieze Jens Som, MD, [facility]  Electronically Signed    EW/MEDQ  D:  11/27/2007  T:  11/27/2007  Job:  [id number]   cc:   Doylene Canning. Ladona Ridgel, MD  Bryan Lemma. Manus Gunning, M.D.

## 2010-10-08 NOTE — Assessment & Plan Note (Signed)
[facility]                         ELECTROPHYSIOLOGY OFFICE NOTE   NAME:[name] [name], [name] [name]                      MRN:          [id number]  DATE:12/11/2006                            DOB:          10-30-1940    Mr. Hudlow returns today for followup.  He is a very pleasant middle-  aged man with a history of persistent atrial fibrillation, sinus  bradycardia, and sinus node dysfunction, and chronic Coumadin therapy,  who returns today for followup.  The patient has maintained his sinus  rhythm very nicely on amiodarone therapy but unfortunately has had some  side effects with this medication, including fatigue, weakness, and  lower extremity discomfort.  He recently (approximately four weeks ago),  discontinued his amiodarone and returns today for followup.  He also  stopped his Coumadin.  The patient states that he denies chest pain or  shortness of breath.  He has had no peripheral edema.  Otherwise no  specific complaints.   PHYSICAL EXAMINATION:  He is a pleasant, well-appearing middle-aged man  in no acute distress.  The blood pressure today was 114/78.  The pulse was 64 and regular.  The  respiration were 18.  The weight was 199 pounds.  NECK:  No jugular venous distention.  LUNGS:  Clear bilaterally to auscultation.  There are no wheezes, rales  or rhonchi.  CARDIOVASCULAR:  Regular rate and rhythm with a normal S1 and S2.  EXTREMITIES:  No edema.   EKG demonstrates a sinus bradycardia with atrial pacing.   Medications include Prozac, Inderal 30 three times a day, HCTZ 12.5 a  day, and aspirin 325 a day.   IMPRESSION:  1. Paroxysmal/persistent atrial fibrillation, still maintaining a      sinus rhythm, despite being off amiodarone for one month.  2. Chronic Coumadin therapy with the patient recently discontinuing      this on his own.  3. Fatigue and weakness on amiodarone.   DISCUSSION:  Overall, Mr. Karnes symptoms are improved in  that he is  feeling better off of amiodarone.  He is maintaining sinus rhythm.  I  have counseled him on the importance of continuing his Coumadin for  thromboembolic prevention, as he will almost certainly go back into A  fib once his amiodarone levels have waned.  For this reason, he is  admitted to go ahead and start him back on his Coumadin therapy;  however, we will continue him off of amiodarone for now, although we may  consider reinitiating this or other anti-arrhythmic drug in the future,  pending his symptoms.  We will plan to see him back in several months.    Doylene Canning. Ladona Ridgel, MD  Electronically Signed   GWT/MedQ  DD: 12/11/2006  DT: 12/12/2006  Job #: [id number]
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
## 2010-10-08 NOTE — Letter (Signed)
April 05, 2008    [name] [name]. [name] Ridgel, MD  [address]  [address]  [place], [place] [address]   RE:  [name] [name]  MRN:  [id number]  /  DOB:  15-Aug-1940   Dear [name] [name],   It was my pleasure to see your patient, [name] [name] in EP Clinic today  to discuss possible atrial fibrillation ablation.  As you recall, [name] is a very pleasant 70 year old gentleman with a history of  paroxysmal atrial fibrillation.  He reports initially being diagnosed  with atrial fibrillation approximately 5 years ago, upon presenting for  a preoperative assessment prior to gallbladder surgery.  Since that  time, he has had increasing frequency and duration of episodes of atrial  fibrillation.  He describes symptoms of fatigue and decreased exercise  capacity when in atrial fibrillation.  He notes frequently feeling  washed out following an episode.  He also notes frequent palpitations.  The patient previously was treated with amiodarone, but did not tolerate  this medication due to flushing, nervousness, and insomnia.  He  subsequently had been initiated on dofetilide in July 2009.  Since  starting this medication, he continues to have episodes of atrial  fibrillation several times per week.  A recent interrogation of the  patient's pacemaker has documented atrial fibrillation 29% of the time.  He was most recently evaluated by you in clinic and is now referred for  consideration of catheter ablation.   PAST MEDICAL HISTORY:  1. Paroxysmal atrial fibrillation (as above).  2. Status post implantation of a Medtronic EnRhythm dual-chamber      pacemaker implanted in June 06, 2005, for sick sinus syndrome.  3. Hypertension.  4. Hyperlipidemia.  5. Status post left heart catheterization in 2005, which revealed 40%      stenosis of the mid left anterior descending artery.  6. Preserved ejection fraction.  7. Status post right total hip arthroplasty.  8. Status post surgery on both thumbs.  9.  Degenerative joint disease.  10.Remote migraines.  11.Status post appendectomy.  12.Status post rhinoplasty and uvulectomy.  13.Status post cholecystectomy.  14.Status post right rotator cuff repair in 2006.   ALLERGIES:  The patient reports hallucinations with DEMEROL.  He has  also had difficulties with SEDATION and ANESTHESIA in the past.  Specifically, he reports significant urinary retention as well as  postoperative confusion, insomnia, and an atypical headache lasting  several weeks following an ANESTHESIA.   SOCIAL HISTORY:  The patient lives in [place].  He is retired from  Physiological scientist.  He denies tobacco and drug use.  He drinks  approximately two alcoholic beverages per week.   FAMILY HISTORY:  The patient's mother died of coronary artery disease  and cerebrovascular disease.   CURRENT MEDICATIONS:  1. Prozac 40 mg daily.  2. Coumadin to maintain an INR between 2 and 3.  3. Pravastatin 40 mg daily.  4. Dofetilide 500 mcg b.i.d.  5. Potassium chloride 20 mEq daily.  6. Inderal 20 mg t.i.d.  7. Xanax 0.5 mg p.r.n.   REVIEW OF SYSTEMS:  All systems reviewed and negative except as outlined  above.   PHYSICAL EXAMINATION:  VITALS:  Blood pressure 120/80, heart rate 61,  respirations 18, weight 204 pounds.  GENERAL:  The patient is a well-appearing male in no acute distress.  He  is alert and oriented x3.  HEENT:  Normocephalic, atraumatic.  Sclerae clear.  Conjunctivae pink.  Oropharynx clear.  NECK:  Supple.  No  JVD, lymphadenopathy, or bruits.  LUNGS:  Clear to auscultation bilaterally.  HEART:  Regular rate and rhythm.  No murmurs, rubs, or gallops.  GI:  Soft, nontender, nondistended.  Positive bowel sounds.  EXTREMITIES:  No clubbing, cyanosis, or edema.  NEUROLOGIC:  Strength and sensation are intact.  SKIN:  No ecchymosis or lacerations.  MUSCULOSKELETAL:  No deformity or atrophy.  PSYCHIATRIC:  Euthymic mood, full affect.   EKG, atrial paced rhythm  at 60 beats per minute with a PR interval of  200 milliseconds.  No significant ST/T-wave changes are observed.   IMPRESSION:  [name] [name] is a very pleasant 70 year old gentleman with  paroxysmal atrial fibrillation who presents today for further  therapeutic strategies of atrial fibrillation.  Therapeutic strategies  for atrial fibrillation including both medicine and catheter-based  therapies were discussed in detail with the patient today in clinic by  me.  Risks, benefits, and alternatives to electrophysiology study and  radiofrequency ablation were also discussed in detail.  I think that the  patient would certainly be a reasonable candidate for catheter ablation  of atrial fibrillation.   PLAN:  1. We will obtain a transthoracic echocardiogram to evaluate for any      structural heart disease, valvular heart disease, and to assess the      patient's left atrial dimension.  We will also obtain a thyroid      profile as I do not see that one has been recently obtained to rule      out thyroid disease as a cause for the patient's atrial      fibrillation, but I think this would be highly unlikely.  2. No medication changes were made today.  3. We will schedule the patient for EP study and radiofrequency      ablation for atrial fibrillation at the next available time.    Sincerely,      Hillis Range, MD  Electronically Signed    JA/MedQ  DD: 04/05/2008  DT: 04/06/2008  Job #: [id number]   CC:    [name] [name]. [name] [name], M.D.

## 2010-10-08 NOTE — Assessment & Plan Note (Signed)
[facility]                         ELECTROPHYSIOLOGY OFFICE NOTE   NAME:[name] [name], [name] [name]                      MRN:          [id number]  DATE:02/02/2007                            DOB:          1941-02-17    [name] [name] returns today for followup.  He is a very pleasant middle-  aged man with a history of symptomatic bradycardia and paroxysmal atrial  fibrillation, who had been maintained very nicely in a sinus rhythm on  amiodarone, however, the patient developed side effects from amiodarone,  ultimately insisted on it being stopped.  This was accomplished  approximately 6 months ago.  He returns today for followup.  He does not  have much in the way of symptomatic palpitations or AFib.  Otherwise, he  has been stable.  His photosensitivity has resolved off of amiodarone as  well.   PHYSICAL EXAMINATION:  GENERAL:  He is a pleasant well-appearing middle-  aged man in no acute distress.  VITAL SIGNS:  The blood pressure is 116/82, the pulse is 70 and regular,  the respirations 16, the weight is 200 pounds.  NECK:  Revealed no jugular venous distention.  LUNGS:  Clear bilaterally to auscultation.  No wheezes, rales, or  rhonchi are present.  CARDIOVASCULAR:  Revealed a regular rate and rhythm with normal S1 and  S2.  EXTREMITIES:  Demonstrated no edema.  SKIN:  Normal.   MEDICATIONS:  1. Prozac.  2. Inderal 30, three times a day.  3. HCTZ 12.5 daily.  4. Aspirin 325 a day.  5. Warfarin as directed.  6. Pravachol 40 a day.   Interrogation of his pacemaker demonstrates a Medtronic InRhythm.  The P waves are 7.  The R waves are 10.  The impedance 560 in the atrium, 624 in the ventricle.  The threshold 0.5 at 0.4 in the atrium, and 1 at 0.4 in the right  ventricle.  The battery voltage was 2.99 volts.  His underlying rhythm was sinus bradycardia in the low 30s.  He had 133 mode switches.  Approximately 6% of the time he was in AFib.   IMPRESSION:  1. Symptomatic bradycardia.  2. Paroxysmal atrial fibrillation.  3. Chronic Coumadin therapy.   DISCUSSION:  Overall, [name] [name] is stable and his pacemaker is working  normally.  He is maintained mostly sinus rhythm and not AFib.  I will  see him back in 6 months.     Doylene Canning. Ladona Ridgel, MD  Electronically Signed    GWT/MedQ  DD: 02/02/2007  DT: 02/02/2007  Job #: [id number]   cc:   Bryan Lemma. Manus Gunning, M.D.

## 2010-10-08 NOTE — Consult Note (Signed)
[name] [name], [name] [name]               ACCOUNT NO.:  [id number]   MEDICAL RECORD NO.:  [id number]          PATIENT TYPE:  EMS   LOCATION:  ED                           FACILITY:  [facility]   PHYSICIAN:  Corinna L. Lendell Caprice, MDDATE OF BIRTH:  [birth year]   DATE OF CONSULTATION:  DATE OF DISCHARGE:  06/10/2008                                 CONSULTATION   REASON FOR CONSULTATION:  Headache and dizziness.   IMPRESSIONS/RECOMMENDATIONS:  1. Headache, dizziness, nausea, all currently improved.  I suspect      this may be a migraine.  He has a history of migraines and reports      some phonophobia.  He received Tylenol and meclizine and currently      is feeling better.  The CAT scan of the brain is negative.  This is      not consistent with posterior circulation stroke.  The meclizine      did seem to help.  It sounds as if he did not have true vertigo,      however, he may take meclizine or Tylenol as needed for return of      his symptoms.  There is no need for admission at this time.  2. Atrial fibrillation status post radiofrequency catheter ablation on      May 03, 2008.  This has his bases with this.  3. History of pacemaker for tachy-brady syndrome.  4. History of migraines.  5. Depression.  6. History of anxiety.   HPI:  Mr. Ferrante is a 70 year old white male patient of Dr. Manus Gunning who  presented to [facility] Emergency Room with the above symptoms.  He was  visiting his son who is hospitalized here.  He was reading the paper  when he felt suddenly dizzy.  He felt nauseated.  He had a severe  headache.  He laid down which worsened his pain.  Apparently, a nurse on  the floor took his pulse and recommended he come to the emergency room.  He and his are concerned that it is related to his heart.  Patient  reports some photophobia.  He feels better currently after Tylenol and  meclizine.  He has had no sensation of room spinning.  He is on Inderal  chronically for chronic  migraines and has not had many over the past  several decades.  Dr. Ignacia Palma consulted me for admission.  He reports  that initially he thought patient might be able to go home with Tylenol  and meclizine but apparently the patient requested that he be admitted  to observe for arrhythmia.  Dr. Ignacia Palma has discussed the case with Dr.  Antoine Poche, on call for Dr. Ladona Ridgel.  Patient has not had any significant  arrhythmias here in the emergency room and Dr. Antoine Poche has declined  admission so I was called.   PAST MEDICAL HISTORY:  As above.   MEDICATIONS:  1. Coumadin 2-1/2 mg a day.  2. Inderal 20 mg p.o. t.i.d.  3. Xanax as needed.  4. Prozac 40 mg a day.  5. Kay Ciel 20 mEq a day.  6.  Pravastatin 40 mg a day.  7. He is no longer on Tikosyn.   SOCIAL HISTORY:  Patient drinks occasionally.  He does not smoke.  He is  married.   FAMILY HISTORY:  His mother had heart problems and strokes beginning in  her mid 97s.   REVIEW OF SYSTEMS:  As above, otherwise negative.   PHYSICAL EXAMINATION:  Patient is afebrile.  His heart rate is 60.  Blood pressure is 120/70.  Respiratory rate 16.  Oxygen saturation 100%  on 2 L nasal cannula oxygen.  GENERAL:  Patient is well nourished, well developed, in no acute  distress.  HEENT:  Normocephalic, atraumatic.  Pupils equal, round, and reactive to  light.  Sclerae are nonicteric.  He has some left-sided nystagmus,  horizontal nystagmus.  Tympanic membranes clear.  Moist mucous  membranes.  NECK:  Supple.  No carotid bruits.  LUNGS:  Clear to auscultation bilaterally without wheezes, rhonchi, or  rales.  CARDIOVASCULAR:  Regular rate and rhythm without murmurs, gallops, or  rubs.  ABDOMEN:  Normal bowel sounds.  Soft, nontender, nondistended.  GU:  Deferred.  RECTAL:  Deferred.  EXTREMITIES:  No clubbing, cyanosis, or edema.  SKIN:  No rash.  PSYCHIATRIC:  Normal affect.  NEUROLOGIC:  Alert and oriented.  Cranial nerves are intact.  Motor   strength 5/5.  Romberg negative.  Gait normal.  Heel-to-toe walking  normal.  Finger to nose normal.  Sensation intact.  Deep tendon reflexes  equal.  Babinski is negative.   LABS:  His INR is 1.7.  CBC and complete metabolic panel unremarkable.  Urinalysis shows 100 protein, otherwise negative.  Two sets of point  care enzymes negative.  EKG shows ventricular pacing.  Chest x-ray shows  cardiomegaly without anything acute.  CT brain shows nothing acute.  Mucous retention cyst or polyp in the left maxillary sinus.   Patient has improved.  This is unrelated to his heart.  I suspect that  this may be migraine.  Also, consider labyrinthitis but it sounds as if  he never had true vertigo.  This is not characteristic for posterior  circulation stroke or other stroke and CAT scan is negative.  I have  also recommended that he increase his Coumadin to 5 alternating with 2.5  mg a day as he is subtherapeutic and to follow up with the Coumadin  clinic next week.  He is feeling better and agrees with this plan.  All  questions were answered.  He may certainly follow up with Dr. Manus Gunning  and Dr. Lewayne Bunting should his symptoms continue.      Corinna L. Lendell Caprice, MD  Electronically Signed     CLS/MEDQ  D:  06/10/2008  T:  06/11/2008  Job:  [id number]   cc:   Bryan Lemma. Manus Gunning, M.D.  Fax: [phone]   Doylene Canning. Ladona Ridgel, MD  [address]  [address]  [place]  [place] [address]

## 2010-10-08 NOTE — Letter (Signed)
June 26, 2008    [name] [name]. [name] Ridgel, MD  [address]  [address]  [place], [place] [address]   RE:  [name] [name]  MRN:  [id number]  /  DOB:  1941/03/16   [name] [name],   It was my pleasure to see [name] [name] in the electrophysiology  followup today.  As you recall, [name] [name] is a pleasant 70 year old  gentleman with a history of paroxysmal atrial fibrillation, status post  EP study and radiofrequency ablation for atrial fibrillation on May 02, 2008.   PROBLEM LIST:  1. Paroxysmal atrial fibrillation, status post electrophysiology study      and radiofrequency ablation for atrial fibrillation on May 02, 2008.  2. Status post prior implantation of a Medtronic and rhythm, dual-      chamber pacemaker on June 06, 2005 for tachycardia-bradycardia      syndrome.  3. Hypertension.  4. Obesity.  5. Migraines.  6. Degenerative joint disease.   CURRENT MEDICATIONS:  1. Prozac 40 mg daily.  2. Coumadin to maintain an INR between 2-3.  3. Tikosyn, discontinued.  4. Pravastatin 40 mg daily.  5. Inderal 20 mg t.i.d.  6. Potassium chloride 20 mEq daily.  7. Xanax 0.5 mg p.r.n.   INTERVAL HISTORY:  [name] [name] reports doing very well since his recent  catheter ablation for atrial fibrillation on June 03, 2007.  At the  time of his ablation, he underwent successful electrical isolation of  all 4 pulmonary veins.  During ablation along the ostia of the left  superior pulmonary vein, he converted to atrial fibrillation.  He was  found to have a trigger area between the base of the left atrial  appendage and the left superior pulmonary vein.  Extensive  radiofrequency ablation was performed in this location with termination  of atrial fibrillation.  A wide area circumferential ablation approach  was used around the left-sided pulmonary veins.  Additional ablation of  complex fractionated atrial electrograms were performed along the roof  of the left atrium, the  interatrial septum, and along the lateral wall  of the left atrium.  Following ablation, he has done well without  symptoms of chest pain, shortness of breath, dysphasia neurologic  sequelae, or other concerns.  He discontinued Tikosyn in early January.  He reports having 2 symptomatic episodes of atrial fibrillation since  his ablation.  The first occurred on June 02, 2008 and he reports  approximately 3-4 hours of fatigue, which subsequently resolved.  The  second episode occurred on June 10, 2008.  He reports being in [place], visiting a family member when he developed a headache with  vertiginous symptoms.  He presented to the emergency department and his  symptoms were felt to be likely secondary to migraines.  Interestingly,  an EKG from that time reveals an atrial paced rhythm at 60 beats per  minute.  He denies any other symptomatic episodes of atrial fibrillation  and is otherwise quite happy following his ablation procedure.  He feels  that his frequency of symptomatic atrial fibrillation has significantly  reduced.   PHYSICAL EXAMINATION:  VITALS:  Blood pressure 116/70, heart rate 76,  respirations 18, and weight 200 pounds.  GENERAL:  The patient is a well-appearing gentleman in no acute  distress.  He is alert and oriented x3.  HEENT:  Normocephalic, atraumatic.  Sclerae clear.  Conjunctivae pink.  Oropharynx clear.  NECK:  Supple.  No JVD, thyromegaly,  or bruits.  LUNGS:  Clear to auscultation bilaterally.  HEART:  Regular rate and rhythm.  No murmurs, rubs, or gallops.  GI:  Soft, nontender, and nondistended.  Positive bowel sounds.  EXTREMITIES:  No clubbing, cyanosis, or edema.  NEUROLOGIC:  Strength and sensation are intact.  SKIN:  No ecchymosis or lacerations.  MUSCULOSKELETAL:  No deformity or atrophy.  PSYCHIATRIC:  Euthymic mood.  Full affect.   Devicing care interrogation of the patient's dual-chamber pacemaker is  interrogated today and found to  be functioning appropriately in the DDDR  pacing mode with a lower rate limit of 60 and an upper tracking rate of  110 beats per minute.  The battery status is good with a voltage of 2.99  volts.  The atrial lead P-wave measures 2.7 mV with an impedance of 488  ohms and a threshold of 0.5 V at 0.4 msec.  The right ventricular lead R-  wave measures 7.5 mV with impedance of 544 ohms and a threshold of 1 V  at 0.4 msec.  The patient is 72% A-paced and 2% ventricularly paced.  He  has had 17% mode switches for atrial arrhythmias since December 2008.  No changes were made today.   IMPRESSION:  [name] [name] is a pleasant 70 year old gentleman with a  history of paroxysmal atrial fibrillation and hypertension, status post  electrophysiology study and radiofrequency ablation for his atrial  fibrillation on May 02, 2008.  He has done quite well since his  ablation.  He reports that his symptomatic atrial fibrillation burden  has significantly been reduced.  I do think that he continues to have  some episodes of atrial fibrillation for which he is asymptomatic.  He  continues to be in the 3 months of blanking period following the  ablation and certainly we did perform a significant amount of ablation  within the left atrium.  It may be that as the left atrium continues to  remodel and repair from his ablation procedure that his atrial  arrhythmia burden will continue to decreased.  I am, however, pleased  that he has done well from a symptom standpoint, despite stopping  Tikosyn since his ablation.   PLAN:  1. No medication changes were made today.  2. No pacemaker programming changes were made today.  3. The patient will follow up with you in clinic as previously      scheduled on October 2010.   Thank you for the opportunity of participating in the care of [name].  Please feel free to contact me if I can be of further  assistance.    Sincerely,      Hillis Range, MD   Electronically Signed    JA/MedQ  DD: 06/26/2008  DT: 06/27/2008  Job #: [id number]

## 2010-10-08 NOTE — Discharge Summary (Signed)
NAMEOCTAVIUS, SHIN               ACCOUNT NO.:  [id number]   MEDICAL RECORD NO.:  [id number]          PATIENT TYPE:  INP   LOCATION:  2921                         FACILITY:  [facility]   PHYSICIAN:  Hillis Range, MD       DATE OF BIRTH:  April 13, 1941   DATE OF ADMISSION:  05/02/2008  DATE OF DISCHARGE:                               DISCHARGE SUMMARY   FINAL DIAGNOSES:  1. Discharging postprocedure day #1 status post comprehensive      electrophysiology study/radiofrequency catheter ablation of atrial      fibrillation by way of pulmonary vein isolation strategy.      Additional radiofrequency burst given to the roof of the left      atrium between the left superior pulmonary vein and the left atrial      appendage.  2. Transthoracic echocardiogram (limited).  Postprocedure, no      complications.   SECONDARY DIAGNOSES:  1. History of persistent atrial fibrillation, chronic Coumadin.      a.     Symptoms of fatigue.  2. Chronic Tikosyn therapy.  3. Tachybrady syndrome status post pacemaker implant June 06, 2005,      a Medtronic EnRhythm dual chamber.  4. Repair of torn right rotator cuff December 2006.  5. Status post laparoscopic cholecystectomy April 2005.   PROCEDURE:  May 02, 2008, electrophysiology study, radiofrequency  catheter ablation of atrial fibrillation via a pulmonary vein isolation  technique, Dr. Hillis Range.  No postprocedural complications including  his groins have no hematoma.   BRIEF HISTORY:  Mr. Luppino is a 70 year old male.  He has a history of  persistent atrial fibrillation dual-chamber pacemaker implantation.  His  atrial fibrillation is paroxysmal.  He is on Tikosyn and Coumadin.  When  he is in atrial fibrillation, however, he is subject to extreme fatigue.  He does not complain of chest pain, palpitations, or shortness of  breath.  The patient has been seen in consultation by Dr. Johney Frame and  recommended for pulmonary vein isolation of atrial  fibrillation.   HOSPITAL COURSE:  The patient presents electively December 8, he  underwent electrophysiology study and radiofrequency catheter ablation  procedure without complications discharging postprocedure day #1.  Of  note, his INR is subtherapeutic.  On admission, his home dose of  Coumadin has been 2.5 mg daily on a regular basis.  He will go home with  injectable Lovenox and an increase in his dosing schedule on close  followup with [facility].  He follows up there on Friday,  May 05, 2008, at 9:30 in the morning.  He sees Dr. Johney Frame on  June 27, 2007, Monday at 9:45.   DISCHARGE MEDICATIONS:  1. Lovenox 30 mg injected twice daily for 4 doses, that is new      medication.  2. Coumadin 5 mg daily, Wednesday, December 9 and Thursday December 10      and then to start 2.5 mg daily, Friday December 12, this is a new      dose.  3. Tikosyn 500 mcg twice daily.  He is to continue  Tikosyn until      Wednesday May 31, 2008, and then to stop.  4. Inderal 20 mg 3 times daily.  5. Xanax 0.5 mg twice daily.  6. Pravastatin 40 mg daily.  7. Fluoxetine (Prozac) 40 mg daily.  8. Potassium chloride as before this admission.  He follows up at      [facility] as dictated above.   LABORATORY STUDIES:  Pertinent to this admission were drawn on April 24, 2008.  White cells 5.1, hemoglobin 13.8, hematocrit 40.3, and  platelets 187.  Protime at that time was 26.4, INR 2.6.  Sodium 139,  potassium 4.4, chloride 106, carbonate 29, glucose 74, BUN 16,  creatinine 0.8.      Maple Mirza, [place]      Hillis Range, MD  Electronically Signed    GM/MEDQ  D:  05/03/2008  T:  05/03/2008  Job:  [id number]   cc:   Doylene Canning. Ladona Ridgel, MD  Hillis Range, MD  Bryan Lemma. Manus Gunning, M.D.

## 2010-10-08 NOTE — Assessment & Plan Note (Signed)
[facility]                         ELECTROPHYSIOLOGY OFFICE NOTE   NAME:[name] [name], [name] [name]                      MRN:          [id number]  DATE:11/23/2007                            DOB:          April 23, 1941    [name] [name] returns today for followup.  He is a very pleasant, middle-  aged male with a history of persistent atrial fibrillation who we had  very nicely controlled on amiodarone therapy, but subsequently developed  side effects of his amiodarone including photophobia and visual  problems, all of which have resolved with discontinuation of his  amiodarone.  The patient called our office several days ago and noted  that he was feeling terrible and that he was out of rhythm with elevated  heart rates.  He has been better since then.   CURRENT MEDICATIONS:  1. Prozac 40 a day.  2. Inderal 30 mg 3 times daily.  3. HCTZ 12.5 daily.  4. Aspirin 325 a day.  5. Warfarin as directed.  6. Pravachol 40 a day.  7. Lipitor 20 a day.  8. Coumadin as directed.   PHYSICAL EXAMINATION:  He is a pleasant, middle-aged man in no acute  distress.  The blood pressure was 115/79, pulse 75 and regular, respirations were  18, and weight was 200 pounds.  NECK:  No jugular venous distention.  LUNGS:  Clear bilaterally to auscultation.  No wheezes, rales, or  rhonchi are present.  CARDIOVASCULAR:  Regular rate and rhythm with a normal S1 and S2.  There  were no murmurs, rubs, or gallops.  ABDOMEN:  Soft, nontender, and nondistended.  There was no organomegaly.  EXTREMITIES:  No cyanosis, clubbing, or edema.  Pulses were 2+ and  symmetric.   Review of the patient's cardiac pacemaker evaluation from 2 weeks ago  demonstrates that he has been in atrial fibrillation about 25% of the  time.   IMPRESSION:  1. Symptomatic paroxysmal atrial fibrillation.  2. History of bradycardia status post pacemaker insertion.  3. Hypertension.  4. Obesity.   DISCUSSION:   Because [name] [name] was very symptomatic, we consider  initiation of Tikosyn which would be done as an inpatient, given him the  option of watchful waiting.  Because he has  felt poorly, he would like to go ahead and try Tikosyn and this will be  scheduled early as possible so that convenient time for admission and  initiation of the medication.     Doylene Canning. Ladona Ridgel, MD  Electronically Signed    GWT/MedQ  DD: 11/23/2007  DT: 11/24/2007  Job #: [id number]   cc:   Bryan Lemma. Manus Gunning, M.D.

## 2010-10-11 NOTE — Assessment & Plan Note (Signed)
[facility]                         ELECTROPHYSIOLOGY OFFICE NOTE   NAME:Newcombe, ALDO SONDGEROTH                      MRN:          [id number]  DATE:06/09/2006                            DOB:          [birth year]    Mr. Gwyn returns today for followup.  He is a very pleasant middle-  aged male with symptomatic tachybrady syndrome status post pacemaker  insertion with paroxysmal atrial fibrillation and hypertension.  He is  on chronic Coumadin therapy.  Over the last several months the patient  notes increased feeling of lack of energy and has noted that his heart  has gone out of rhythm more frequently.  He denies chest pain or  shortness of breath except when he is out of rhythm he gets dyspneic and  feels palpitations.  He has had no syncope.   EXAMINATION:  GENERAL:  He is a pleasant, well-appearing, middle-aged  man in no distress.  VITAL SIGNS:  Blood pressure was 106/74, the pulse 87 and irregular, the  respirations were 18, the weight was 199 pounds.  NECK:  Revealed no jugular venous distention.  LUNGS:  Clear bilaterally to auscultation.  There were no wheezes, rales  or rhonchi.  CARDIOVASCULAR:  Revealed an irregularly irregular rhythm with normal S1  and S2.  EXTREMITIES:  Demonstrated no edema.   Interrogation of his pacemaker demonstrates a Medtronic EnRhythm with  fibrillation waves of 3 and R waves of 7, the pacing impedance 472 in  the atrium and 720 in the ventricular, the threshold 1 volt at 0.4 in  the right ventricle, the atrial threshold could not be obtained  secondary to his underlying atrial fibrillation.  The battery voltage  was 2.99 volts.  Review of the patient's electrograms demonstrated that  he had increasingly frequent episodes of atrial fibrillation, though his  atrial fibrillation burden was still less than 20% of the time.   IMPRESSION:  1. Symptomatic tachybrady syndrome.  2. Paroxysmal atrial fibrillation with  increasingly frequent episodes      associated with a rapid ventricular response.   DISCUSSION:  Today I have discussed treatment options with Mr. Graffam.  I have recommended that he start on low-dose amiodarone.  He will be  given a prescription for 200 mg twice daily for 2 weeks, followed by 200  mg daily thereafter.  I have asked that he decrease his dose of Coumadin  after the first 4 days of amiodarone initiation to a half tablet daily.  He will follow up for PT/INR in approximately 2 weeks.     Doylene Canning. Ladona Ridgel, MD  Electronically Signed    GWT/MedQ  DD: 06/09/2006  DT: 06/09/2006  Job #: [id number]

## 2010-10-11 NOTE — Consult Note (Signed)
NAMEPHILBERT, [name] [name]               ACCOUNT NO.:  [id number]   MEDICAL RECORD NO.:  [id number]          PATIENT TYPE:  INP   LOCATION:  1420                         FACILITY:  [facility]   PHYSICIAN:  Rollene Rotunda, M.D.   DATE OF BIRTH:  1941-01-19   DATE OF CONSULTATION:  04/30/2005  DATE OF DISCHARGE:                                   CONSULTATION   PRIMARY CARE PHYSICIAN:  Bryan Lemma. Manus Gunning, M.D.   CARDIOLOGIST:  Doylene Canning. Ladona Ridgel, M.D.   REASON FOR CONSULTATION:  Patient with atrial fibrillation and a rapid rate.   HISTORY OF PRESENT ILLNESS:  We are called to see this pleasant 70 year old  gentleman status post right rotator cuff surgery.  He has atrial  fibrillation.  This morning, (surgery was yesterday evening), he developed  increased palpitations and heart rate into the 140s to 150s.  He was  symptomatic with this with a jittery feeling.  He said he felt panicked.  He did not develop chest discomfort, neck discomfort, arm discomfort,  activity-induced nausea and vomiting or excessive diuresis.  He had no  presyncopal or syncope.  He did notice that it was only this morning that  his regional block wore off, and he started having more pain in his shoulder  as well.  He was managed initially with resumption of his Inderal.  He has  had some IV Lopressor, and it has improved symptomatically.   PAST MEDICAL HISTORY:  Paroxysmal (now persistent) atrial fibrillation,  chronic anticoagulation therapy, hypertension, hyperlipidemia, migraine  headaches, nonobstructive coronary artery disease (catheterization in  October, 2005 with a 40% mid LAD lesion).   PAST SURGICAL HISTORY:  Cholecystectomy, appendectomy, uvular surgery, right  hip repair, hand surgery x2.   ALLERGIES:  DEMEROL.   CURRENT MEDICATIONS:  1.  Coumadin.  2.  Inderal 20 mg t.i.d.  3.  Prilosec 20 mg daily.  4.  Prozac 40 mg daily.  5.  Xanax 2.5 mg t.i.d.  6.  Aspirin 81 mg daily.  7.  Ibuprofen.  8.  Lipitor  20 mg q.h.s.   SOCIAL HISTORY:  Patient lives in [place] with his wife.  He is  retired from [employer].   He does not smoke cigarettes or drink alcohol.   FAMILY HISTORY:  Contributory for early coronary artery disease.  His mother  died of stomach cancer, but she had an MI prior to this.  She died in her  79s.   REVIEW OF SYSTEMS:  As stated in the HPI.  Positive for joint pain, snoring,  anxiety.  Negative for other systems.   PHYSICAL EXAMINATION:  VITAL SIGNS:  Blood pressure 110/42, heart rate 120s  and irregular.  GENERAL:  Patient is in no distress.  HEENT:  Eyes are unremarkable.  Pupils are equal, round and reactive to  light.  Fundi not visualized.  Oral mucosa unremarkable.  NECK:  No jugular venous distention.  Wave form within normal limits.  Carotid upstrokes brisk and symmetric.  No bruits.  No thyromegaly.  LYMPHATICS:  No adenopathy.  LUNGS:  Clear to auscultation bilaterally.  BACK:  No  costovertebral angle tenderness.  CHEST:  Unremarkable.  HEART:  PMI nondisplaced or sustained.  S1 and S2 within normal limits.  No  S3.  No murmurs.  ABDOMEN:  Obese.  Positive bowel sounds.  Normal in frequency.  No bruits.  No rebound.  No guarding.  __________.  No masses.  No hepatomegaly.  No  splenomegaly.  SKIN:  No rashes.  No nodules.  EXTREMITIES:  Pulses 2+ throughout.  No clubbing, cyanosis or edema.  NEUROLOGIC:  Oriented to person, place, and time.  Cranial nerves II-XII are  grossly intact.  Motor grossly intact.   EKG:  Atrial fibrillation.  Rapid ventricular response.  Axis within normal  limits.  Intervals within normal limits.  No acute ST/T wave changes.   ASSESSMENT/PLAN:  Atrial fibrillation:  The patient has atrial fibrillation  with rapid ventricular response.  He will be controlled with his Inderal and  will also start Cardizem p.o.  We probably will not have perfect rate  control with pain.  No other cardiovascular testing is suggested.  He is   restarting his Coumadin, which has been okayed by surgery.           ______________________________  Rollene Rotunda, M.D.     JH/MEDQ  D:  04/30/2005  T:  04/30/2005  Job:  [id number]   cc:   Doylene Canning. Ladona Ridgel, M.D.  [address]  [address]  [place]  [place] [address]   Bryan Lemma. Manus Gunning, M.D.  Fax: [phone]

## 2010-10-11 NOTE — Cardiovascular Report (Signed)
[name] [name], [name] [name]               ACCOUNT NO.:  [id number]   MEDICAL RECORD NO.:  [id number]          PATIENT TYPE:  OIB   LOCATION:  6501                         FACILITY:  [facility]   PHYSICIAN:  Carole Binning, M.D. LHCDATE OF BIRTH:  06/25/1940   DATE OF PROCEDURE:  03/20/2004  DATE OF DISCHARGE:                              CARDIAC CATHETERIZATION   PROCEDURE PERFORMED:  Left heart catheterization with coronary angiography  and left ventriculography.   SURGEON:   INDICATIONS FOR PROCEDURE:  [name] is a 70 year old male with a history  of atrial fibrillation.  He has had a negative stress test in the past.  However, he recently developed symptoms of recurrent substernal chest pain  on a near daily basis which has been progressive in nature.  He was  therefore, referred for cardiac catheterization.  He has been on chronic  Coumadin and his Coumadin has been held for an adequate period of time in  order for his INR to decrease to a safe range for catheterization.   DESCRIPTION OF PROCEDURE:  A 4 French sheath was placed in the right femoral  artery.  Coronary angiography was performed with standard Judkins 4 French  catheters.  Left ventriculography was performed with an angled pigtail  catheter.  Contrast was Omnipaque.  There were no complications.   RESULTS:  Hemodynamics:  Left ventricular pressure 136/6, aortic pressure  136/84.  There was no aortic valve gradient.   Left ventriculogram:  Wall motion is normal.  Ejection fraction is estimated  at 55 to 60%.  There was no mitral regurgitation.   Coronary arteriography (right dominant):  1.  Left main is normal.  2.  Left anterior descending artery has a discrete 40% stenosis in the mid      vessel.  The LAD is otherwise normal giving rise to three small diagonal      branches.  3.  Left circumflex gives rise to a small ramus intermedius, normal size      first obtuse marginal and a small second obtuse marginal  branch.  The      left circumflex is normal.  4.  Right coronary artery is a dominant vessel.  The right coronary artery      gives rise to a normal size posterior descending artery and a normal      size bifurcating posterolateral branch.  The right coronary artery is      angiographically normal.   IMPRESSION:  1.  Normal left ventricular systolic function.  2.  Mild one vessel coronary artery disease characterized by nonobstructive      disease in the left anterior descending artery.   RECOMMENDATIONS:  Medical therapy.       MWP/MEDQ  D:  03/20/2004  T:  03/20/2004  Job:  [id number]   cc:   Bryan Lemma. Manus Gunning, M.D.  [address] [place]  [place] [address]  Fax: [phone]   Doylene Canning. Ladona Ridgel, M.D.

## 2010-10-11 NOTE — Op Note (Signed)
NAMEMCKENNA, BORUFF               ACCOUNT NO.:  [id number]   MEDICAL RECORD NO.:  [id number]          PATIENT TYPE:  OIB   LOCATION:  0098                         [facility]ACILITY:  [facility]   PHYSICIAN:  Georges Lynch. Gioffre, M.D.DATE OF BIRTH:  [birth year]   DATE OF PROCEDURE:  04/29/2005  DATE OF DISCHARGE:                                 OPERATIVE REPORT   SURGEON:  Georges Lynch. Darrelyn Hillock, M.D.   ASSISTANT:  Jamelle Rushing, P.A.   PREOPERATIVE DIAGNOSES:  1.  Partial tear of the rotator cuff tendon, right shoulder.  2.  Severe impingement syndrome, right shoulder.   POSTOPERATIVE DIAGNOSES:  1.  Partial tear of the rotator cuff tendon, right shoulder.  2.  Severe impingement syndrome, right shoulder.   OPERATION:  1.  Partial acromionectomy and acromioplasty, right shoulder.  2.  Repair of a tear of the rotator cuff tendon utilizing the Restore tendon      graft. No Mitek anchors were necessary.   DESCRIPTION OF PROCEDURE:  Under general anesthesia, routine orthopedic prep  and draping of the right shoulder was carried out. Note the patient had  interscalene block prior to bringing him back for the anesthetic. He had 1  gram of IV Ancef. At this time, an incision was made over the anterior  aspect of the right shoulder, bleeders identified and cauterized. Following  that, I separated the deltoid tendon from the acromion in the usual fashion.  He had severe impingement syndrome of his shoulder. His acromion was  markedly overgrown and down sloped. I protected the cuff with the Bennett  retractor and did a partial acromionectomy and acromioplasty. Following  that, I then utilized the bur to even out the undersurface of the acromion  and then I bone waxed the undersurface of the acromion for hemostasis  purposes. I then irrigated out the shoulder. He had some irregular tears of  the rotator cuff. I reinforced the cuff and repaired the cuff with a Restore  tendon graft. I thoroughly irrigated  out the area, inserted some thrombin-  soaked Gelfoam, reapproximated the deltoid tendon and muscle in the usual  fashion. The main part of the wound was closed in the usual fashion. Sterile  dressings were applied and the patient was placed in a shoulder immobilizer.           ______________________________  Georges Lynch Darrelyn Hillock, M.D.     RAG/MEDQ  D:  04/29/2005  T:  04/30/2005  Job:  [id number]

## 2010-10-11 NOTE — Procedures (Signed)
[facility]  Patient:    [name] [name], [name] [name]                      MRN: [id number] Proc. Date: 05/05/00 Adm. Date:  [id number] Disc. Date: [id number] Attending:  Caleen Essex CC:         Dyanne Carrel, M.D.   Procedure Report  PROCEDURE:  Colonoscopy with biopsy.  INDICATION FOR PROCEDURE:  Heme positive stools, chronic diarrhea and a history of rectal fistula repair.  DESCRIPTION OF PROCEDURE:  The patient was placed in the left lateral decubitus position and placed on the pulse monitor with continuous low flow oxygen delivered by nasal cannula. He was sedated with 75 mg IV Demerol and  7 mg IV Versed. The Olympus video colonoscope was inserted into the rectum and advanced to the cecum, confirmed by transillumination of McBurneys point and visualization of the ileocecal valve and appendiceal orifice. The prep was good. The terminal ileum was entered and explored for several centimeters and appeared to be within normal limits. Biopsies were taken of the terminal ileum to rule out Crohns disease due to his history of perianal fistula. The cecum, ascending, transverse, descending and sigmoid colon all appeared normal with no masses, polyps, diverticula or other mucosal abnormalities. The rectum likewise appeared normal and retroflexed view of the anus did reveal some small internal hemorrhoids. The colonoscope was then withdrawn and the patient returned to the recovery room in stable condition. The patient tolerated the procedure well and there were no immediate complications.  IMPRESSION:  Internal hemorrhoids otherwise normal colonoscopy with normal appearing terminal ileum.  PLAN:  Await biopsy results and if unrevealing will repeat Hemoccults in a few weeks. DD:  05/05/00 TD:  05/05/00 Job: [id number]

## 2010-10-11 NOTE — Op Note (Signed)
[facility]  Patient:    [name] [name], [name] [name]                      MRN: [id number] Proc. Date: 01/17/00 Adm. Date:  [id number] Disc. Date: [id number] Attending:  Caleen Essex                           Operative Report  PREOPERATIVE DIAGNOSIS:  Fistula-in-ano.  POSTOPERATIVE DIAGNOSIS:  Fistula-in-ano.  PROCEDURE:  Exam under anesthesia, fistulotomy.  SURGEON:  Ollen Gross. Vernell Morgans, M.D.  ANESTHESIA:  General via LMA.  DESCRIPTION OF PROCEDURE:  After informed consent was obtained, the patient was brought to the operating room and placed in the supine position on the operating room table.  After adequate dosing of general anesthesia, the patient was placed in lithotomy position, and his rectum and perineum region were prepped with Betadine and draped in the usual sterile manner.  Initially, a lubricated finger was placed within the rectal vault to palpate for any abnormalities.  None could be appreciated.  Next, a well-lubricated metal bullet was gently eased into the anal canal.  It was removed, and then a bullet with a retractor was then replaced gently into the anal canal.  The anal canal was examined in a 360-degree manner.  The only abnormality that could be found was an external opening of a fistula in the one oclock position.  A probe was able to be inserted into this, and an internal opening was easy to find.  This tract was then opened with the Bovie electrocautery, and the base of the fistula tract was Fogarated with cautery.  In the process of examining the rectum and the anus, a small tear was noted posteriorly secondary to placement of the retractor.  This area was hemostatic.  Both this area and the fistula tract were infiltrated with 0.25% Marcaine with epinephrine.  No other abnormalities were seen.  Next, a piece of Gelfoam lubricated with 2% lidocaine jelly was rolled and placed within the rectum as a dressing.  The patient tolerated the  procedure well.  At the end of the case, all needle, sponge, and instrument counts were correct.  The patient was awakened and taken to the recovery room in stable condition. DD:  01/17/00 TD:  01/19/00 Job: [id number]

## 2010-10-11 NOTE — Assessment & Plan Note (Signed)
[facility]                         ELECTROPHYSIOLOGY OFFICE NOTE   NAME:[name] [name], [name] [name]                      MRN:          [id number]  DATE:09/15/2006                            DOB:          11-26-1940    Mr. Zarazua returns today for followup. He is a very pleasant, middle-  age man with a history of symptomatic tachybrady syndrome who is status  post pacemaker. He has continued to be bothered by atrial fibrillation  and for this reason was started on amiodarone and is now on one tablet  daily. His A fib has improved markedly having very rare palpitations  lasting just a few seconds at a time. His main complaint today is that  of sensation of easily getting sun burned. He states that he is outside  very little but notes that his skin gets red and hurts actually despite  being outside minimally.   PHYSICAL EXAMINATION:  GENERAL:  He is a pleasant, well-appearing,  middle-age man in no distress.  VITAL SIGNS:  The blood pressure was 124/85, the pulse 67 and regular,  the respirations were 18 and the weight was 198 pounds.  NECK:  Revealed no jugular venous distention.  LUNGS:  Clear bilaterally to auscultation. There were no wheezes, rales  or rhonchi.  CARDIOVASCULAR:  Regular rate and rhythm with normal S1 and S2.  EXTREMITIES:  Demonstrated no edema.  SKIN:  Demonstrated very minimal redness.   IMPRESSION:  1. Symptomatic bradycardia status post pacemaker.  2. Paroxysmal atrial fibrillation.  3. Unusual photo sensitivity to the amiodarone.  4. Back pain for upcoming myelogram.   DISCUSSION:  Because of Mr. Mcmeen now very infrequent and brief  episodes of A fib, I have asked that he be okay to stop his Coumadin  prior to his myelogram. He will continue on his amiodarone for now. He  also might require discontinuation of this drug secondary to  photosensitivity. Hopefully this will not be the case however. Before  stopping the drug, I  would probably try to decrease it first.     Doylene Canning. Ladona Ridgel, MD  Electronically Signed    GWT/MedQ  DD: 09/15/2006  DT: 09/15/2006  Job #: [id number]

## 2010-10-11 NOTE — Discharge Summary (Signed)
[name] [name], [name] [name]               ACCOUNT NO.:  [id number]   MEDICAL RECORD NO.:  [id number]          PATIENT TYPE:  OIB   LOCATION:  3740                         FACILITY:  [facility]   PHYSICIAN:  Maple Mirza, P.A. DATE OF BIRTH:  1941-01-03   DATE OF ADMISSION:  06/06/2005  DATE OF DISCHARGE:  06/07/2005                                 DISCHARGE SUMMARY   PRIMARY DIAGNOSES:  1.  Discharging day one, status post implantation Medtronic EnRhythm dual-      chamber pacemaker.  2.  Paroxysmal atrial fibrillation on chronic Coumadin.  3.  Right shoulder surgery recently.  4.  Tachybrady syndrome, severe bradycardia in sinus rhythm, rapid      ventricular rate in atrial fibrillation.   PROCEDURES:  June 06, 2005:  Implantation of dual-chamber Medtronic  pacemaker, Dr. Lewayne Bunting.  Patient has had no complications post  procedure.  He did have some right shoulder pain immediately after the  procedure due to immobilization and recent surgery.  This has abated  somewhat.  The patient's incision will be inspected post procedure day #1.  A chest x-ray will also be taken, and the device will be interrogated.   ALLERGIES:  DEMEROL.   FOLLOWUP APPOINTMENTS:  Appointments will be made through the office two  weeks at the [facility], three months with Dr. Ladona Ridgel. Arrangements for  nurses will discuss mobility of the left arm with the patient prior to  discharge.   DIET:  He is discharged on a low sodium, low cholesterol diet.   ACTIVITY:  He is asked not to drive for one week, no lifting of greater than  10 pounds for the next four weeks with the left arm.  He can return to  physical therapy in one week.   WOUND CARE:  He is asked to keep his incision dry for the next seven days,  to sponge bathe until Friday, January 19th.   MEDICATIONS:  1.  Coumadin 5 mg Monday, Wednesday, Friday, 2.5 mg Tuesday, Thursday,      Saturday, Sunday.  2.  Prozac 40 mg daily.  3.  Lipitor 20 mg  daily at bedtime.  4.  Prilosec 20 mg daily.  5.  Hydrochlorothiazide 12.5 mg daily.  6.  Inderal 30 mg three times daily.   BRIEF HISTORY:  [name] [name] is a 70 year old male.  He has a history of  paroxysmal atrial fibrillation with rapid rates and in sinus rhythm he is  bradycardic.  He recently underwent shoulder surgery and developed atrial  fibrillation with rapid ventricular rate postoperatively.  This was  controlled with AV nodal blocking drugs.  The patient tends to develop  bradycardia in sinus rhythm, and his rapid ventricular rate has proved  difficult to control with AV nodal blocking drugs.  Treatment options were  discussed with the patient.  He has failed medical therapy, and it is  recommended that he undergo permanent pacemaker implantation followed by up  titration of medical therapy including beta blockers.  The patient will be  seen back in the office in several weeks.   HOSPITAL COURSE:  The patient presented electively on June 06, 2005.  He  underwent implantation of pacemaker without complication.  The incision will  be inspected post procedure day #1 as well as chest x-ray taken.  The device  will also be interrogated.  The office will notify him of an appointment,  two weeks with the [facility], three months with Dr. Ladona Ridgel, and also a  three-week appointment with Dr. Ladona Ridgel to discuss up titration of beta  blockers.      Maple Mirza, P.A.     GM/MEDQ  D:  06/06/2005  T:  06/08/2005  Job:  [id number]   cc:   Windy Fast A. Darrelyn Hillock, M.D.  Fax: [phone]

## 2010-10-11 NOTE — Discharge Summary (Signed)
[name] [name], [name] [name]               ACCOUNT NO.:  [id number]   MEDICAL RECORD NO.:  [id number]          PATIENT TYPE:  INP   LOCATION:  1420                         FACILITY:  [facility]   PHYSICIAN:  Georges Lynch. Gioffre, M.D.DATE OF BIRTH:  07-30-1940   DATE OF ADMISSION:  04/29/2005  DATE OF DISCHARGE:  05/01/2005                                 DISCHARGE SUMMARY   ADMISSION DIAGNOSES:  1.  Right shoulder rotator cuff tear.  2.  History of coronary artery disease.  3.  History of reflux disease.  4.  History of chronic anticoagulation therapy due to previous atrial      fibrillation.  5.  History of migraines.   DISCHARGE DIAGNOSES:  1.  Paroxysmal atrial fibrillation, rapid response controlled with p.o.      medications postop.  2.  Right shoulder rotator cuff repair, open.  3.  History of hypertension.  4.  History of hyperlipidemia.  5.  History of migraines.  6.  History of coronary artery disease.   HISTORY OF PRESENT ILLNESS:  The patient is a 70 year old gentleman, who has  been having long-term problems with his right shoulder, been evaluated by  Dr. Darrelyn Hillock, found to have a rotator cuff tear and impingement.  The patient  was discussed with Dr. Darrelyn Hillock surgical treatments which include  acromioplasty and rotator cuff repair with restore patch augmentation.  The  patient elected to proceed.   ALLERGIES:  DEMEROL.   CURRENT MEDICATIONS:  1.  Coumadin.  2.  Inderal.  3.  Prilosec.  4.  Prozac.  5.  Xanax.  6.  Aspirin.  7.  Ibuprofen.  8.  Lipitor.   SURGICAL PROCEDURE:  On April 29, 2005, the patient was taken to the OR by  Dr. Worthy Rancher and Arlyn Leak, PA-C.  Under general anesthesia, underwent  a right shoulder open rotator cuff repair with acromioplasty.  A restore  patch was used to augment the rotator cuff repair.  The patient tolerated  the procedure well.  There was minimal blood loss.  The patient was  transferred to the recovery room and then to a  monitored bed for  postoperative care due to his history of atrial fibrillation and atrial  fibrillation with rapid response postop in the recovery room.   CONSULTS:  A cardiology consult was requested for evaluation of his rapid  response atrial fibrillation postop.   HOSPITAL COURSE:  On April 29, 2005, the patient was admitted to [facility] under the care of Dr. Worthy Rancher.  The patient was taken to  the OR where an open right shoulder rotator cuff repair was performed with  Restore graft.  Acromioplasty was also performed for his impingement.  The  patient tolerated this well and was transferred to the recovery room in good  condition.  In the recovery room, the patient had a rapid ventricular  response to his atrial fibrillation, so cardiology was consulted for  evaluation and management.  The patient was then transferred to a telemetry  bed where he underwent the rest of his postoperative care over the next  2  days.  Cardiology evaluated him, recommended correction of his rapid  response with Inderal and some Cardizem.  This was performed by their  services, and his rate improved.  The patient overall had no other  complaints or problems during his hospitalization until just prior to being  discharged when he complained of feeling full and unable to void.  The nurse  took it upon herself to recatheterize the patient with a Foley catheter, and  he was felt more comfortable.  Dr. Margrett Rud service was contacted for  recommendations, and the Foley catheter was left in with instructions to  follow up with Dr. Vernie Ammons.   On hospital day #2, from a cardiac standpoint, the patient was stable with  appropriate rate control on his Cardizem and Inderal.  He was also restarted  on his Coumadin.  From the orthopedic standpoint, his wound was benign.  His  upper extremity was neuromotor and vascularly intact, and he was ready for  discharge.  The patient was discharged in  improved condition.   LABORATORY DATA:  H&H on December 6:  WBC 8.7, hemoglobin 13.5, hematocrit  38.6, platelets 203.  Routine chemistries on the same date:  Sodium 137,  potassium 4.5, glucose 114, __________ 12, creatinine 1.6.  On December 6,  EKG showed atrial fibrillation with rapid ventricular response at 127 beats  per minute.   MEDICATIONS UPON DISCHARGE:  1.  Coumadin 5 mg daily.  2.  Inderal 20 mg t.i.d.  3.  Pepcid 20 mg daily.  4.  Zelnorm 2 mg daily.  5.  Xanax 0.25 mg t.i.d.  6.  Prozac 40 mg q.h.s.  7.  Reglan 10 mg p.o. q.6h. p.r.n.  8.  Percocet 1-2 tabs q.4-6h. p.r.n.  9.  Ambien 10 mg p.o. q.h.s. p.r.n.  10. Robaxin 500 mg p.o. q.6h. p.r.n.  11. Diltiazem 30 mg p.o. q.8h.  12. Flomax 0.4 mg  13. Diltiazem 30 mg q.8h. was discontinued in transition to Cardizem CD 180      mg daily.   DISCHARGE INSTRUCTIONS:  1.  Diet no restrictions.  2.  Activity no restrictions.  3.  Wound care.  The patient is to change dressing daily.  4.  Medications as written with the addition of Flomax 0.4 mg once daily.  5.  Cardizem was 180 mg daily.  6.  Follow up with Dr. Darrelyn Hillock.  The patient is to call for appointment the      following week.  7.  Follow up with Dr. Vernie Ammons for urinary issues, [phone].  8.  The patient's condition upon discharge is improved.      Jamelle Rushing, P.A.    ______________________________  Georges Lynch Darrelyn Hillock, M.D.    RWK/MEDQ  D:  06/04/2005  T:  06/05/2005  Job:  [id number]

## 2010-10-11 NOTE — Op Note (Signed)
NAMEDERRIN, [name] [name]               ACCOUNT NO.:  [id number]   MEDICAL RECORD NO.:  [id number]          PATIENT TYPE:  OIB   LOCATION:  3740                         FACILITY:  [facility]   PHYSICIAN:  Doylene Canning. Ladona Ridgel, M.D.  DATE OF BIRTH:  1940-11-11   DATE OF PROCEDURE:  06/06/2005  DATE OF DISCHARGE:                                 OPERATIVE REPORT   PROCEDURE PERFORMED:  Implantation of a dual-chamber pacemaker.   SURGEON:  Doylene Canning. Ladona Ridgel, M.D.   INDICATIONS:  Symptomatic tachy-brady syndrome.   I. INTRODUCTION:  The patient is a very pleasant 70 year old man with a  history of paroxysmal atrial fibrillation and severe bradycardia with  resting heart rates in the low 40s.  He was recently hospitalized after  having shoulder surgery and went into atrial fibrillation with a rapid  ventricular response.  He was ultimately treated with beta blockers.  He was  seen by me in the office several days ago and had a heart rate of 40 beats  per minute.  He felt weak and tired; he also feels palpitations when he goes  into atrial fibrillation.  We have been unable to give him medication for  this secondary to his severe bradycardia and is now referred for permanent  pacemaker insertion.   II. PROCEDURE:  After informed consent was obtained, the patient was taken  to the diagnostic EP lab in the fasting state.  After the usual preparation  and draping, intravenous fentanyl and midazolam were given for sedation.  Thirty milliliters of lidocaine was infiltrated in the left infraclavicular  region.  A 5-cm incision was carried out over this region and electrocautery  utilized to dissect down to the fascial plane.  The left subclavian vein was  patent with 10 mL of contrast demonstrating it to be so.  The Medtronic  model [id number], 58-cm active-fixation pacing lead, serial number [id number], was  advanced into the right ventricle and the Medtronic model 5076 52-cm active-  fixation pacing lead, serial  number [id number], was advanced to the right  atrium.  Mapping was carried out in the right ventricle and at the final  site on the RV septum, the R waves measured 10 mV and the pacing impedance  was 809 ohms, the pacing threshold 0.6 volts at 0.5 milliseconds with the  lead actively affixed.  Ten-volt pacing did not stimulate the diaphragm.  With the ventricular lead in satisfactory position, attention was then  turned to placement of the atrial lead.  It was placed in the lateral wall  of the right atrium, where fibrillation waves measured between 2 and 5 mV.  The pacing impedance in the AAO mode was 519 ohms.  Ten-volt pacing did not  stimulate the diaphragm.  With both atrial and ventricular leads in  satisfactory position, they were secured to the subpectoralis fascia with a  figure-of-eight silk suture.  The sew-in sleeve was also secured with a silk  suture.  Electrocautery was utilized to assure hemostasis.  The Medtronic  EnRhythm model [id number] dual-chamber pacemaker, serial number [id number], was  then connected to the atrial and  ventricular pacing lead and placed in the  subcutaneous pocket.  The generator was secured with a silk suture.  Kanamycin irrigation was utilized to irrigate the pocket and the incision  was closed with a layer of 2-0 Vicryl, followed by layer 3-0 Vicryl,  followed by a layer of 4-0 Vicryl.  Benzoin was painted on the skin, Steri-  Strips were applied and a pressure dressing was placed, and the patient was  returned to his room in satisfactory condition.   III. COMPLICATIONS:  There were no immediate procedural complications.   IV. RESULTS:  This demonstrated successful implantation of a Medtronic dual-  chamber pacemaker in a patient with symptomatic tachy-brady syndrome.           ______________________________  Doylene Canning. Ladona Ridgel, M.D.     GWT/MEDQ  D:  06/06/2005  T:  06/07/2005  Job:  [id number]   cc:   Bryan Lemma. Manus Gunning, M.D.  Fax: [phone]

## 2010-10-11 NOTE — Op Note (Signed)
NAMELAYKEN, DOENGES                         ACCOUNT NO.:  [id number]   MEDICAL RECORD NO.:  [id number]                   PATIENT TYPE:  OBV   LOCATION:  0346                                 FACILITY[facility]  [facility]   PHYSICIAN:  Ollen Gross. Vernell Morgans, M.D.              DATE OF BIRTH:  1940/06/02   DATE OF PROCEDURE:  08/28/2003  DATE OF DISCHARGE:  08/29/2003                                 OPERATIVE REPORT   PREOPERATIVE DIAGNOSES:  Gallstones.   POSTOPERATIVE DIAGNOSES:  Gallstones.   PROCEDURE:  Laparoscopic cholecystectomy with intraoperative cholangiogram.   SURGEON:  Ollen Gross. Carolynne Edouard, M.D.   ASSISTANT:  Currie Paris, M.D.   ANESTHESIA:  General endotracheal.   DESCRIPTION OF PROCEDURE:  After informed consent was obtained, the patient  was brought to the operating room, placed in the supine position on the  operating table. After adequate induction of general anesthesia, the  patient's abdomen was prepped with Betadine and draped in the usual sterile  manner. The area below the umbilicus was infiltrated with 0.25% Marcaine, a  small incision was made with the 15 blade knife. This incision was carried  down through the subcutaneous tissue bluntly with a [name] clamp and [employer] retractors until the linea alba was identified. The linea was incised  with a 15 blade knife and each side was grasped with Kocher clamps and  elevated anteriorly. The preperitoneal space was then probed bluntly with a  hemostat until the peritoneum was opened and access was gained to the  abdominal cavity. A #0 Vicryl pursestring stitch was placed on the fascia  surrounding the opening and Hasson cannula was placed through the opening  and anchored in place with the previously placed Vicryl pursestring stitch.  The abdomen was then insufflated with carbon dioxide without difficulty. The  patient was placed in the head up position, rotated slightly with the right  side up. The laparoscope was then  placed through the Hasson cannula, right  upper quadrant was inspected, some of the gallbladder and liver readily  identified. The epigastric region was then infiltrated with 0.25% Marcaine,  a small incision was made with a 15 blade knife and a 10 mm port was placed  bluntly through this incision into the abdominal cavity under direct vision.  Sites were then chosen laterally on the right side of the abdomen with  placement of a 5 mm port.  Each of these areas was infiltrated with 0.25%  Marcaine, small stab incisions were made with a 15 blade knife and 5 mm  ports were placed bluntly through these incisions into the abdominal cavity  under direct vision. A blunt grasper was placed through the lateral most 5  mm port and used to grasp the dome of the gallbladder and elevate it  anteriorly and superiorly. Another blunt grasper was placed through the  other 5 mm port and used to retract on the  body and neck of the gallbladder.  A dissector was placed through the epigastric port and using the cautery,  the peritoneal reflection of the gallbladder neck area was opened, blunt  dissection was then carried out in this area until the gallbladder neck  cystic duct junction was readily identified and a good window was created. A  single clip was placed on the gallbladder neck and a small ductotomy was  made just below the clip.  A 14 gauge angiocath was placed percutaneously  through the anterior abdominal wall under direct vision and a Reddick  cholangiogram catheter was placed through the angiocath and flushed. The  Reddick catheter was then placed within the cystic duct and anchored in  place with a clip. A cholangiogram was obtained that showed no filling  defect, good emptying into the duodenum and a long cystic duct. The  anchoring clip and catheter was then removed from the patient. Three clips  were placed proximally on the cystic duct and the duct was divided between  the two sets of clips.   Posterior to this, the cystic artery was identified  with a branch and each of these branch vessels was dissected bluntly  circumferentially until a good window was created.  Two clips were placed  proximally and one distally on each of the branches and the artery was  divided between the two.  Next, a laparoscopic hook cautery device was used  to separate the gallbladder from the liver bed. Prior to completely  detaching the gallbladder from the liver bed, the liver bed was inspected  and several small bleeding points were coagulated with the electrocautery.  The gallbladder was then detached the rest of the way from the liver bed  without difficulty and an endoscopic bag was placed through the epigastric  port and the gallbladder was placed within the bag and the bag was sealed.  The abdomen was then irrigated with copious amounts of saline until the  affluent was clear. The liver bed was checked again and found to be  hemostatic. The laparoscope was removed to the epigastric port and a  gallbladder grasper was placed through the Hasson cannula and used to grasp  the opening of the bag. The bag with the gallbladder was removed with the  Hasson cannula through the infraumbilical port without difficulty. The  fascial defect was closed with the previously placed Vicryl pursestring  stitch as well as with another interrupted #0 Vicryl stitch.  The rest of  the ports were removed under direct vision and were found to be hemostatic.  Gas was allowed to escape and the skin incisions were all closed with  interrupted 4-0 Monocryl subcuticular stitches.  Benzoin and Steri-Strips  were applied. The patient tolerated the procedure well. At the end of the  case, all sponge, needle and instrument counts were correct. The patient was  then awakened and taken to the recovery room in stable condition.                                              Ollen Gross. Vernell Morgans, M.D.    PST/MEDQ  D:  08/30/2003   T:  08/30/2003  Job:  [id number]

## 2010-10-15 ENCOUNTER — Other Ambulatory Visit: Payer: Self-pay | Admitting: Internal Medicine

## 2010-10-15 ENCOUNTER — Ambulatory Visit (INDEPENDENT_AMBULATORY_CARE_PROVIDER_SITE_OTHER): Payer: Medicare Other | Admitting: *Deleted

## 2010-10-15 DIAGNOSIS — I495 Sick sinus syndrome: Secondary | ICD-10-CM

## 2010-10-22 ENCOUNTER — Ambulatory Visit (INDEPENDENT_AMBULATORY_CARE_PROVIDER_SITE_OTHER): Payer: Medicare Other | Admitting: *Deleted

## 2010-10-22 DIAGNOSIS — I4891 Unspecified atrial fibrillation: Secondary | ICD-10-CM

## 2010-10-22 DIAGNOSIS — Z7901 Long term (current) use of anticoagulants: Secondary | ICD-10-CM

## 2010-10-22 LAB — POCT INR: INR: 2.4

## 2010-11-15 ENCOUNTER — Encounter: Payer: Self-pay | Admitting: *Deleted

## 2010-11-19 ENCOUNTER — Ambulatory Visit (INDEPENDENT_AMBULATORY_CARE_PROVIDER_SITE_OTHER): Payer: Medicare Other | Admitting: *Deleted

## 2010-11-19 DIAGNOSIS — I4891 Unspecified atrial fibrillation: Secondary | ICD-10-CM

## 2010-11-19 DIAGNOSIS — Z7901 Long term (current) use of anticoagulants: Secondary | ICD-10-CM

## 2010-11-28 NOTE — Progress Notes (Signed)
Pacer remote 

## 2010-12-03 ENCOUNTER — Ambulatory Visit (INDEPENDENT_AMBULATORY_CARE_PROVIDER_SITE_OTHER): Payer: Medicare Other | Admitting: *Deleted

## 2010-12-03 DIAGNOSIS — I4891 Unspecified atrial fibrillation: Secondary | ICD-10-CM

## 2010-12-03 DIAGNOSIS — Z7901 Long term (current) use of anticoagulants: Secondary | ICD-10-CM

## 2010-12-03 LAB — POCT INR: INR: 1.7

## 2010-12-16 ENCOUNTER — Other Ambulatory Visit: Payer: Self-pay | Admitting: *Deleted

## 2010-12-16 MED ORDER — WARFARIN SODIUM 5 MG PO TABS: ORAL_TABLET | ORAL | Status: DC

## 2010-12-23 ENCOUNTER — Ambulatory Visit (INDEPENDENT_AMBULATORY_CARE_PROVIDER_SITE_OTHER): Payer: Medicare Other | Admitting: *Deleted

## 2010-12-23 DIAGNOSIS — I4891 Unspecified atrial fibrillation: Secondary | ICD-10-CM

## 2010-12-23 DIAGNOSIS — Z7901 Long term (current) use of anticoagulants: Secondary | ICD-10-CM

## 2010-12-23 LAB — POCT INR: INR: 1.6

## 2011-01-06 ENCOUNTER — Encounter: Payer: Medicare Other | Admitting: *Deleted

## 2011-01-09 ENCOUNTER — Ambulatory Visit (INDEPENDENT_AMBULATORY_CARE_PROVIDER_SITE_OTHER): Payer: Medicare Other | Admitting: *Deleted

## 2011-01-09 DIAGNOSIS — I4891 Unspecified atrial fibrillation: Secondary | ICD-10-CM

## 2011-01-09 DIAGNOSIS — Z7901 Long term (current) use of anticoagulants: Secondary | ICD-10-CM

## 2011-01-09 LAB — POCT INR: INR: 2.5

## 2011-01-16 ENCOUNTER — Encounter: Payer: Medicare Other | Admitting: *Deleted

## 2011-01-23 ENCOUNTER — Encounter: Payer: Self-pay | Admitting: *Deleted

## 2011-01-28 ENCOUNTER — Encounter: Payer: Self-pay | Admitting: Internal Medicine

## 2011-01-28 ENCOUNTER — Other Ambulatory Visit: Payer: Self-pay | Admitting: Internal Medicine

## 2011-01-28 ENCOUNTER — Ambulatory Visit (INDEPENDENT_AMBULATORY_CARE_PROVIDER_SITE_OTHER): Payer: Medicare Other | Admitting: *Deleted

## 2011-01-28 DIAGNOSIS — Z950 Presence of cardiac pacemaker: Secondary | ICD-10-CM

## 2011-01-28 DIAGNOSIS — I4891 Unspecified atrial fibrillation: Secondary | ICD-10-CM

## 2011-01-29 ENCOUNTER — Encounter: Payer: Medicare Other | Admitting: *Deleted

## 2011-01-30 ENCOUNTER — Ambulatory Visit (INDEPENDENT_AMBULATORY_CARE_PROVIDER_SITE_OTHER): Payer: Medicare Other | Admitting: *Deleted

## 2011-01-30 DIAGNOSIS — I4891 Unspecified atrial fibrillation: Secondary | ICD-10-CM

## 2011-01-30 DIAGNOSIS — Z7901 Long term (current) use of anticoagulants: Secondary | ICD-10-CM

## 2011-01-30 LAB — POCT INR: INR: 1.9

## 2011-02-05 LAB — REMOTE PACEMAKER DEVICE
AL AMPLITUDE: 3.1 mv
AL IMPEDENCE PM: 456 Ohm
BAMS-0001: 170 {beats}/min
BATTERY VOLTAGE: 2.97 V
RV LEAD IMPEDENCE PM: 504 Ohm

## 2011-02-10 NOTE — Progress Notes (Signed)
Pacer remote check  

## 2011-02-19 ENCOUNTER — Encounter: Payer: Medicare Other | Admitting: *Deleted

## 2011-02-20 ENCOUNTER — Ambulatory Visit (INDEPENDENT_AMBULATORY_CARE_PROVIDER_SITE_OTHER): Payer: Medicare Other | Admitting: *Deleted

## 2011-02-20 DIAGNOSIS — Z7901 Long term (current) use of anticoagulants: Secondary | ICD-10-CM

## 2011-02-20 DIAGNOSIS — I4891 Unspecified atrial fibrillation: Secondary | ICD-10-CM

## 2011-02-20 LAB — BASIC METABOLIC PANEL
BUN: 11
BUN: 13
CO2: 30
CO2: 30
Calcium: 8.8
Calcium: 9.4
Chloride: 104
Chloride: 107
Creatinine, Ser: 0.84
Creatinine, Ser: 0.84
Creatinine, Ser: 0.87
GFR calc Af Amer: >60
GFR calc non Af Amer: >60
Glucose, Bld: 89
Glucose, Bld: 91
Potassium: 3.8
Sodium: 139

## 2011-02-20 LAB — MAGNESIUM: Magnesium: 2.3

## 2011-02-26 ENCOUNTER — Encounter: Payer: Self-pay | Admitting: *Deleted

## 2011-02-28 LAB — CBC
HCT: 41.4 % (ref 39.0–52.0)
Hemoglobin: 14.2 g/dL (ref 13.0–17.0)
MCHC: 34.3 g/dL (ref 30.0–36.0)
RBC: 4.68 MIL/uL (ref 4.22–5.81)

## 2011-02-28 LAB — BASIC METABOLIC PANEL
CO2: 30 mEq/L (ref 19–32)
Calcium: 9 mg/dL (ref 8.4–10.5)
Chloride: 102 mEq/L (ref 96–112)
GFR calc Af Amer: >60 mL/min (ref >60)
Potassium: 4 mEq/L (ref 3.5–5.1)
Sodium: 138 mEq/L (ref 135–145)

## 2011-02-28 LAB — PROTIME-INR: INR: 1.5 (ref 0.00–1.49)

## 2011-03-13 ENCOUNTER — Ambulatory Visit (INDEPENDENT_AMBULATORY_CARE_PROVIDER_SITE_OTHER): Payer: Medicare Other | Admitting: *Deleted

## 2011-03-13 DIAGNOSIS — I4891 Unspecified atrial fibrillation: Secondary | ICD-10-CM

## 2011-03-13 DIAGNOSIS — Z7901 Long term (current) use of anticoagulants: Secondary | ICD-10-CM

## 2011-03-13 LAB — POCT INR: INR: 3.4

## 2011-03-26 ENCOUNTER — Ambulatory Visit (INDEPENDENT_AMBULATORY_CARE_PROVIDER_SITE_OTHER): Payer: Medicare Other | Admitting: *Deleted

## 2011-03-26 DIAGNOSIS — Z7901 Long term (current) use of anticoagulants: Secondary | ICD-10-CM

## 2011-03-26 DIAGNOSIS — I4891 Unspecified atrial fibrillation: Secondary | ICD-10-CM

## 2011-03-26 LAB — POCT INR: INR: 2.7

## 2011-04-16 ENCOUNTER — Ambulatory Visit (INDEPENDENT_AMBULATORY_CARE_PROVIDER_SITE_OTHER): Payer: Medicare Other | Admitting: *Deleted

## 2011-04-16 DIAGNOSIS — I4891 Unspecified atrial fibrillation: Secondary | ICD-10-CM

## 2011-04-16 DIAGNOSIS — Z7901 Long term (current) use of anticoagulants: Secondary | ICD-10-CM

## 2011-04-16 LAB — POCT INR: INR: 2.1

## 2011-04-25 ENCOUNTER — Telehealth: Payer: Self-pay | Admitting: Internal Medicine

## 2011-04-25 MED ORDER — POTASSIUM CHLORIDE CRYS ER 20 MEQ PO TBCR: 20.0000 meq | EXTENDED_RELEASE_TABLET | Freq: Every day | ORAL | Status: DC

## 2011-04-25 NOTE — Telephone Encounter (Signed)
New msg He wants a refill of potassium called to [facility] on [address]

## 2011-05-16 ENCOUNTER — Ambulatory Visit (INDEPENDENT_AMBULATORY_CARE_PROVIDER_SITE_OTHER): Payer: Medicare Other | Admitting: *Deleted

## 2011-05-16 DIAGNOSIS — I4891 Unspecified atrial fibrillation: Secondary | ICD-10-CM

## 2011-05-16 DIAGNOSIS — Z7901 Long term (current) use of anticoagulants: Secondary | ICD-10-CM

## 2011-05-22 ENCOUNTER — Telehealth: Payer: Self-pay | Admitting: Internal Medicine

## 2011-05-22 NOTE — Telephone Encounter (Signed)
Pt has been off coumadin for 2days on his own and pt surgery is Monday and they have been trying to get in touch with you for a while they have sent 3 messages and they need to hear from someone today regarding this

## 2011-05-22 NOTE — Telephone Encounter (Signed)
Spoke with Toni Amend  This was faxed last night

## 2011-05-28 ENCOUNTER — Telehealth: Payer: Self-pay | Admitting: Pharmacist

## 2011-05-28 NOTE — Telephone Encounter (Signed)
Message copied by Velda Shell on Wed May 28, 2011  8:47 AM ------      Message from: Marinus Maw      Created: Fri May 23, 2011  5:54 PM       Ok to stop coumadin without a bridge.      ----- Message -----         From: Mariane Masters, PHARMD         Sent: 05/16/2011  10:27 AM           To: Lewayne Bunting, MD            Pt pending colonoscopy on 12/31.  Needs clearance to stop Coumadin.  Please advise.

## 2011-05-30 ENCOUNTER — Encounter: Payer: Self-pay | Admitting: Internal Medicine

## 2011-06-03 ENCOUNTER — Encounter: Payer: Self-pay | Admitting: Internal Medicine

## 2011-06-03 ENCOUNTER — Ambulatory Visit (INDEPENDENT_AMBULATORY_CARE_PROVIDER_SITE_OTHER): Payer: Medicare Other | Admitting: *Deleted

## 2011-06-03 ENCOUNTER — Ambulatory Visit (INDEPENDENT_AMBULATORY_CARE_PROVIDER_SITE_OTHER): Payer: Medicare Other | Admitting: Internal Medicine

## 2011-06-03 DIAGNOSIS — I4891 Unspecified atrial fibrillation: Secondary | ICD-10-CM

## 2011-06-03 DIAGNOSIS — Z7901 Long term (current) use of anticoagulants: Secondary | ICD-10-CM

## 2011-06-03 DIAGNOSIS — I10 Essential (primary) hypertension: Secondary | ICD-10-CM

## 2011-06-03 DIAGNOSIS — Z950 Presence of cardiac pacemaker: Secondary | ICD-10-CM | POA: Diagnosis not present

## 2011-06-03 LAB — PACEMAKER DEVICE OBSERVATION
AL IMPEDENCE PM: 552 Ohm
ATRIAL PACING PM: 62.1
BATTERY VOLTAGE: 2.96 V
RV LEAD AMPLITUDE: 6.8 mv

## 2011-06-03 LAB — POCT INR: INR: 2.1

## 2011-06-03 NOTE — Patient Instructions (Signed)
Your physician wants you to follow-up in: 12 months with Dr Taylor You will receive a reminder letter in the mail two months in advance. If you don't receive a letter, please call our office to schedule the follow-up appointment.   Remote monitoring is used to monitor your Pacemaker of ICD from home. This monitoring reduces the number of office visits required to check your device to one time per year. It allows us to keep an eye on the functioning of your device to ensure it is working properly. You are scheduled for a device check from home on 09/04/2011. You may send your transmission at any time that day. If you have a wireless device, the transmission will be sent automatically. After your physician reviews your transmission, you will receive a postcard with your next transmission date.   

## 2011-06-04 ENCOUNTER — Encounter: Payer: Self-pay | Admitting: Internal Medicine

## 2011-06-04 NOTE — Assessment & Plan Note (Signed)
The patient's symptoms are well controlled. He has approximately 36% atrial fibrillation after catheter ablation and with ongoing antiarrhythmic drug therapy. His symptoms however are minimal. He feels well. He will continue his current medical therapy.

## 2011-06-04 NOTE — Progress Notes (Signed)
HPI [name] returns today for followup. He is a pleasant 71 yo man with a h/o symptomatic bradycardia s/p PPM, PAF, s/p catheter ablation. He continues to feel well. His biggest complaint today is ongoing right knee pain. He underwent knee replacement several months ago and has had a slow recovery. Despite this he has tried to remain active. No c/p or sob. He has minimal palpitations. Allergies  Allergen Reactions  . Iohexol      Code: HIVES, Desc: POST MYELOGRAM.  PT ALSO SAID THIS HAPPENED A YEAR AGO WITH A MYELOGRAM AND WITH ESI'S., Onset Date: [id number]   . Meperidine Hcl     REACTION: unspecified     Current Outpatient Prescriptions  Medication Sig Dispense Refill  . ALPRAZolam (XANAX) 0.5 MG tablet Take 0.5 mg by mouth 2 (two) times daily.        Marland Kitchen dronedarone (MULTAQ) 400 MG tablet Take 400 mg by mouth 2 (two) times daily with a meal.        . ferrous sulfate 325 (65 FE) MG tablet Take 325 mg by mouth daily with breakfast.        . FLUoxetine (PROZAC) 40 MG capsule Take 40 mg by mouth daily.        . potassium chloride SA (K-DUR,KLOR-CON) 20 MEQ tablet Take 1 tablet (20 mEq total) by mouth daily.  30 tablet  6  . Probiotic Product (ALIGN) 4 MG CAPS Take 1 capsule by mouth daily.        . propranolol (INDERAL) 20 MG tablet Take 20 mg by mouth 3 (three) times daily.        Marland Kitchen warfarin (COUMADIN) 5 MG tablet Take as directed by Anticoagulation clinic   30 tablet  3     Past Medical History  Diagnosis Date  . Paroxysmal atrial fibrillation   . HTN (hypertension)   . Hyperlipidemia   . DJD (degenerative joint disease)   . Migraines   . Arthritis of knee, right     end-stage  . Bursitis   . Gout   . Tinnitus   . Depression   . Bronchitis   . Heart murmur     ROS:   All systems reviewed and negative except as noted in the HPI.   Past Surgical History  Procedure Date  . Transesophageal echocardiogram 04/2008  . Transthoracic echocardiogram 2006,2009  . Bilteral  thumb joint surgrery   . Right rotator cuff surgery 2006  . Cholecystectomy   . Rhinoplasty   . Uvulectomy   . Appendectomy   . Cardiac catheterization 2005     which revealed 40% stenosis of the mid left anterior descending artery.  . Pacemaker insertion 06/06/2005    Medtronic EnRhythm dual-chamber. For sick sinus syndrome  . Right total hip arthroplasty   . Left rotator cuff repair      Family History  Problem Relation Age of Onset  . Coronary artery disease Mother   . Other Mother     Cerebrovascular Disease  . Cancer       History   Social History  . Marital Status: Married    Spouse Name: N/A    Number of Children: 2  . Years of Education: N/A   Occupational History  . retired    Social History Main Topics  . Smoking status: Never Smoker   . Smokeless tobacco: Not on file  . Alcohol Use: Yes     1-2 daily  . Drug Use: Not on file  .  Sexually Active: Not on file   Other Topics Concern  . Not on file   Social History Narrative  . No narrative on file     BP 112/70  Pulse 65  Ht 5\' 7"  (1.702 m)  Wt 85.639 kg (188 lb 12.8 oz)  BMI 29.57 kg/m2  Physical Exam:  Well appearing middle aged man, NAD HEENT: Unremarkable Neck:  No JVD, no thyromegally Lungs:  Clear with no wheezes, rales, or rhonchi. Well-healed permanent pacemaker incision. HEART:  Regular rate rhythm, no murmurs, no rubs, no clicks Abd:  soft, positive bowel sounds, no organomegally, no rebound, no guarding Ext:  2 plus pulses, no edema, no cyanosis, no clubbing Skin:  No rashes no nodules Neuro:  CN II through XII intact, motor grossly intact  DEVICE  Normal device function.  See PaceArt for details. Atrial fibrillation is present 36% of the time  Assess/Plan:

## 2011-06-04 NOTE — Assessment & Plan Note (Signed)
His blood pressure is well controlled. He will continue his current medical therapy and maintain a low-sodium diet. 

## 2011-06-04 NOTE — Assessment & Plan Note (Signed)
His device is working normally. He will return for followup in several months.

## 2011-06-11 ENCOUNTER — Other Ambulatory Visit: Payer: Self-pay | Admitting: Internal Medicine

## 2011-06-12 DIAGNOSIS — M25569 Pain in unspecified knee: Secondary | ICD-10-CM | POA: Diagnosis not present

## 2011-06-13 ENCOUNTER — Other Ambulatory Visit (HOSPITAL_COMMUNITY): Payer: Self-pay | Admitting: Orthopedic Surgery

## 2011-06-13 DIAGNOSIS — M25562 Pain in left knee: Secondary | ICD-10-CM

## 2011-06-17 ENCOUNTER — Encounter (HOSPITAL_COMMUNITY): Payer: Self-pay

## 2011-06-17 ENCOUNTER — Encounter (HOSPITAL_COMMUNITY)
Admission: RE | Admit: 2011-06-17 | Discharge: 2011-06-17 | Disposition: A | Discharge status: Home / Self Care | Payer: Medicare Other | Source: Ambulatory Visit | Attending: Orthopedic Surgery | Admitting: Orthopedic Surgery

## 2011-06-17 DIAGNOSIS — Z96659 Presence of unspecified artificial knee joint: Secondary | ICD-10-CM | POA: Insufficient documentation

## 2011-06-17 DIAGNOSIS — M25569 Pain in unspecified knee: Secondary | ICD-10-CM | POA: Diagnosis not present

## 2011-06-17 DIAGNOSIS — M25562 Pain in left knee: Secondary | ICD-10-CM

## 2011-06-17 DIAGNOSIS — M25469 Effusion, unspecified knee: Secondary | ICD-10-CM | POA: Diagnosis not present

## 2011-06-17 MED ORDER — TECHNETIUM TC 99M MEDRONATE IV KIT
24.0000 | PACK | Freq: Once | INTRAVENOUS | Status: AC | PRN
  Administered 2011-06-17: 24 via INTRAVENOUS

## 2011-06-25 DIAGNOSIS — M25569 Pain in unspecified knee: Secondary | ICD-10-CM | POA: Diagnosis not present

## 2011-06-27 ENCOUNTER — Other Ambulatory Visit: Payer: Self-pay | Admitting: Internal Medicine

## 2011-07-01 ENCOUNTER — Ambulatory Visit (INDEPENDENT_AMBULATORY_CARE_PROVIDER_SITE_OTHER): Payer: Medicare Other | Admitting: *Deleted

## 2011-07-01 ENCOUNTER — Other Ambulatory Visit: Payer: Self-pay | Admitting: Internal Medicine

## 2011-07-01 DIAGNOSIS — Z7901 Long term (current) use of anticoagulants: Secondary | ICD-10-CM | POA: Diagnosis not present

## 2011-07-01 DIAGNOSIS — I4891 Unspecified atrial fibrillation: Secondary | ICD-10-CM

## 2011-07-01 LAB — POCT INR: INR: 2.3

## 2011-07-08 ENCOUNTER — Other Ambulatory Visit: Payer: Self-pay | Admitting: Orthopedic Surgery

## 2011-07-18 ENCOUNTER — Telehealth: Payer: Self-pay | Admitting: Internal Medicine

## 2011-07-18 NOTE — Telephone Encounter (Signed)
Spoke with patient's wife.  He is having redo knee surgery.  Dr Gean Birchwood with [facility]  Surgery is scheduled for 08/08/11.

## 2011-07-18 NOTE — Telephone Encounter (Signed)
New problem:  Patient calling upcoming surgery- discuss coumadin.

## 2011-07-21 NOTE — Telephone Encounter (Signed)
Discussed with Dr Ladona Ridgel  Patient may proceed with surgery  He is low risk

## 2011-07-24 DIAGNOSIS — D235 Other benign neoplasm of skin of trunk: Secondary | ICD-10-CM | POA: Diagnosis not present

## 2011-07-24 DIAGNOSIS — L738 Other specified follicular disorders: Secondary | ICD-10-CM | POA: Diagnosis not present

## 2011-07-25 ENCOUNTER — Encounter (HOSPITAL_COMMUNITY): Payer: Self-pay | Admitting: Pharmacy Technician

## 2011-08-01 ENCOUNTER — Other Ambulatory Visit (HOSPITAL_COMMUNITY): Payer: Medicare Other

## 2011-08-04 ENCOUNTER — Other Ambulatory Visit: Payer: Self-pay

## 2011-08-04 ENCOUNTER — Encounter (HOSPITAL_COMMUNITY)
Admission: RE | Admit: 2011-08-04 | Discharge: 2011-08-04 | Disposition: A | Discharge status: Home / Self Care | Payer: Medicare Other | Source: Ambulatory Visit | Attending: Orthopedic Surgery | Admitting: Orthopedic Surgery

## 2011-08-04 ENCOUNTER — Encounter (HOSPITAL_COMMUNITY): Payer: Self-pay

## 2011-08-04 DIAGNOSIS — T84039A Mechanical loosening of unspecified internal prosthetic joint, initial encounter: Secondary | ICD-10-CM | POA: Diagnosis not present

## 2011-08-04 DIAGNOSIS — Z01812 Encounter for preprocedural laboratory examination: Secondary | ICD-10-CM | POA: Diagnosis not present

## 2011-08-04 DIAGNOSIS — Z01811 Encounter for preprocedural respiratory examination: Secondary | ICD-10-CM | POA: Diagnosis not present

## 2011-08-04 DIAGNOSIS — Z96659 Presence of unspecified artificial knee joint: Secondary | ICD-10-CM | POA: Diagnosis not present

## 2011-08-04 HISTORY — DX: Anemia, unspecified: D64.9

## 2011-08-04 HISTORY — DX: Anxiety disorder, unspecified: F41.9

## 2011-08-04 HISTORY — DX: Other complications of anesthesia, initial encounter: T88.59XA

## 2011-08-04 HISTORY — DX: Adverse effect of unspecified anesthetic, initial encounter: T41.45XA

## 2011-08-04 LAB — BASIC METABOLIC PANEL
Chloride: 103 mEq/L (ref 96–112)
GFR calc Af Amer: 73 mL/min — ABNORMAL LOW (ref >90)
Potassium: 4.5 mEq/L (ref 3.5–5.1)

## 2011-08-04 LAB — DIFFERENTIAL
Basophils Absolute: 0.1 10*3/uL (ref 0.0–0.1)
Lymphocytes Relative: 32 % (ref 12–46)
Monocytes Absolute: 0.6 10*3/uL (ref 0.1–1.0)
Monocytes Relative: 10 % (ref 3–12)
Neutro Abs: 3.3 10*3/uL (ref 1.7–7.7)

## 2011-08-04 LAB — URINALYSIS, ROUTINE W REFLEX MICROSCOPIC
Ketones, ur: NEGATIVE mg/dL
Leukocytes, UA: NEGATIVE
Nitrite: NEGATIVE
Urobilinogen, UA: 1 mg/dL (ref 0.0–1.0)
pH: 7 (ref 5.0–8.0)

## 2011-08-04 LAB — PROTIME-INR
INR: 2.76 — ABNORMAL HIGH (ref 0.00–1.49)
Prothrombin Time: 29.6 seconds — ABNORMAL HIGH (ref 11.6–15.2)

## 2011-08-04 LAB — CBC
HCT: 42.1 % (ref 39.0–52.0)
Hemoglobin: 14.6 g/dL (ref 13.0–17.0)
RDW: 13.3 % (ref 11.5–15.5)
WBC: 6.3 10*3/uL (ref 4.0–10.5)

## 2011-08-04 LAB — APTT: aPTT: 49 seconds — ABNORMAL HIGH (ref 24–37)

## 2011-08-04 MED ORDER — CHLORHEXIDINE GLUCONATE 4 % EX LIQD: 60.0000 mL | Freq: Once | CUTANEOUS | Status: DC

## 2011-08-04 NOTE — Pre-Procedure Instructions (Signed)
20 RICHERD GRIME  08/04/2011   Your procedure is scheduled on:  08/08/2011  Report to [facility] at 8:30 AM.  Call this number if you have problems the morning of surgery: [phone]   Remember:   Do not eat food:After Midnight.  May have clear liquids: up to 4 Hours before arrival.  Clear liquids include soda, tea, black coffee, apple or grape juice, broth.  Take these medicines the morning of surgery with A SIP OF WATER: xanax, multaq, prozac, inderal    Do not wear jewelry, make-up or nail polish.  Do not wear lotions, powders, or perfumes. You may wear deodorant.  Do not shave 48 hours prior to surgery.  Do not bring valuables to the hospital.  Contacts, dentures or bridgework may not be worn into surgery.  Leave suitcase in the car. After surgery it may be brought to your room.  For patients admitted to the hospital, checkout time is 11:00 AM the day of discharge.   Patients discharged the day of surgery will not be allowed to drive home.  Name and phone number of your driver: with wife  Special Instructions: CHG Shower Use Special Wash: 1/2 bottle night before surgery and 1/2 bottle morning of surgery.   Please read over the following fact sheets that you were given: Pain Booklet, Coughing and Deep Breathing, Blood Transfusion Information, MRSA Information and Surgical Site Infection Prevention

## 2011-08-04 NOTE — Progress Notes (Signed)
Due to pt. Reports of throat irritation post anesth.  & a-fib worsening with anesth. Spoke with Ramon Dredge, she will review the chart. Pt. Reassured.  Pt. Interested in a block ( spinal) /w sedation rather than general  Anesthesia.

## 2011-08-05 NOTE — Consult Note (Signed)
Anesthesia:  Patient is a 71 year old male scheduled for a 71 year old male scheduled for a left total knee revision on 08/08/11.  History includes  PAF/afib with history of catheter ablation, HLD, murmur, gout, depression, anemia, HTN, migraines, OSA, symptomatic bradycardia s/p Medtronic PPM (Dr. Ladona Ridgel), s/p left TKA 04/2010.  Irritation of throat and tendency for worsening his afib are listed under anesthesia complications.  He last saw Dr. Ladona Ridgel on 06/04/11 for afib and PPM follow-up.  Cardiac meds include Multaq, propranolol, and warfarin.  According to his notes, Mr. Pare stays in afib approximately "36% of the time."  EKGs yesterday showed afib with possible inferior infarct.  One EKG also showed V-pacing.  His inferior changes were noted on a prior EKG from 2011.   TEE on 05/01/08 showed:  - Overall left ventricular systolic function was normal. - Mild fixed plaquing of thoracic aorta. - There was no left atrial appendage thrombus identified. - NO PFO present as tested with agitated saline.  2D echo on 04/18/08 showed: - Overall left ventricular systolic function was normal. Left ventricular ejection fraction was estimated to be 55 %. - The aortic valve was mildly calcified. - Left atrial size was at the upper limits of normal. - There was the appearance of a catheter or pacing wire in the right ventricle. - There was the appearance of a catheter or pacing wire in the right atrium. - Trivial MR, Trace TR.  A left heart cath done on 03/20/04 showed nonobstructive CAD on his LAD (40% stenosis), normal LV systolic function.  CXR showed no acute process.  Labs noted.  Repeat PT/PTT on the day of surgery.

## 2011-08-05 NOTE — H&P (Signed)
HISTORY OF PRESENT ILLNESS:  [name] returns in follow up for pain in his left total knee.  He is approximately one year out from surgery.  The knee was done by Dr. Darrelyn Hillock at [facility].  He developed postoperative arthrofibrosis and underwent closed manipulation by Dr. Charlann Boxer in March 2012.  He reports that recently he has had worsening pain over the medial side of his knee that prevents him from doing anything and generally makes ambulation and daily activities difficult.  He has no fevers, chills or issues with the surgical wound.  On his last visit Dr. Turner Daniels was concerned about possible loosening of femoral component and ordered a bone scan.  [name] [name] comes in today to review the results.  PAST MEDICAL HISTORY:  In the past, he has had back pain, joint swelling, hip arthritis.  He denies any allergies.  He is not a diabetic.  No elevated cholesterol.  He wears glasses and has had a hearing loss since 1985.  Has headaches since 1980 and ringing in the ears since 1995.  He is on warfarin for atrial fibrillation.  Review of systems reviewed thoroughly and all other systems are negative as related to the chief complaint.  SOCIAL HISTORY:  Does not smoke.  Has about 3 ounces of alcohol a week.  He is married, lives with his wife, and is retired.  He still does a little part-time office work.  He brings with him operative reports for the total knee done by Dr. Darrelyn Hillock.  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 Jeannetta Ellis office as well.  ROS: Review of systems reviewed thoroughly and all other systems are negative as related to the chief complaint. Patient denies dizziness, nausea, fever, chills, vomiting, shortness of breath, chest pain, loss of appetite, or rash.    PHYSICAL EXAM: Well-developed, well-nourished.  Awake, alert, and oriented x3.  Extraocular motion is intact.  No use of accessory respiratory muscles for breathing.   Cardiovascular exam reveals  a regular rhythm.  Skin is intact without cuts, scrapes, or abrasions. Regarding appearance of the knee, it is not swollen.  It is not warm to touch.  There is no effusion.  Range of motion is 5 to 95 degrees.  Collateral ligaments are stable. The scar tissue does feel a little bit thick to palpation, but I would say that it is within normal limits.  Normal pulses to the foot.  Normal sensation of the foot.  He does have a little bit of numbness lateral to the surgical incision which is a normal finding after this type of surgery.  RADIOGRAPHS:  Other reviewed images.  X-rays are reviewed showing well-placed, well-fixed Sigma RP total knee.  Good cement mantle, no obvious loosening, and, again, the sizing appears to be appropriate.  Careful examination of the lateral view shows a small area of lucency at the junction of the posterior condyles with the metal that appears to have slightly enlarged over the last year on comparison x-rays that I shot today, but this area is only about 1 mm x 2 or 3 mm, and I do not believe it represents any significant osteolysis and does not represent loosening.  The cement mantles to the patella and tibial component appear to be in excellent condition.  The bone scan demonstrates increased uptake along the femoral component suggestive of possible loosening.  IMPRESSION:  Painful left total knee with possible loosening on the bone scan.  PLAN:  We have discussed the options in detail with [name] today.  Because of the findings on the bone scan there is more than a 50% chance that Dr. Turner Daniels will find a loose component at surgery.  His other option would be to have x-rays every six months to see if there is any evidence of loosening on the plain x-rays.  [name] would like to think about this and will call our office if he is ready to schedule surgery.  If he decides to proceed with surgery and the femoral component is found to be loose, we will likely replace it with a  stemmed TC-3 femur.

## 2011-08-07 MED ORDER — DEXTROSE-NACL 5-0.45 % IV SOLN: INTRAVENOUS | Status: DC

## 2011-08-07 MED ORDER — CEFAZOLIN SODIUM-DEXTROSE 2-3 GM-% IV SOLR
2.0000 g | INTRAVENOUS | Status: AC
  Administered 2011-08-08: 2 g via INTRAVENOUS
  Filled 2011-08-07: qty 50

## 2011-08-08 ENCOUNTER — Inpatient Hospital Stay (HOSPITAL_COMMUNITY)
Admission: RE | Admit: 2011-08-08 | Discharge: 2011-08-11 | DRG: 468 | Disposition: A | Discharge status: Home / Self Care | Payer: Medicare Other | Source: Ambulatory Visit | Attending: Orthopedic Surgery | Admitting: Orthopedic Surgery

## 2011-08-08 ENCOUNTER — Encounter (HOSPITAL_COMMUNITY): Payer: Self-pay | Admitting: Vascular Surgery

## 2011-08-08 ENCOUNTER — Encounter (HOSPITAL_COMMUNITY)
Admission: RE | Disposition: A | Discharge status: Home / Self Care | Payer: Self-pay | Source: Ambulatory Visit | Attending: Orthopedic Surgery

## 2011-08-08 ENCOUNTER — Ambulatory Visit (HOSPITAL_COMMUNITY): Payer: Medicare Other | Admitting: Vascular Surgery

## 2011-08-08 ENCOUNTER — Ambulatory Visit (HOSPITAL_COMMUNITY): Payer: Medicare Other

## 2011-08-08 DIAGNOSIS — T8484XA Pain due to internal orthopedic prosthetic devices, implants and grafts, initial encounter: Secondary | ICD-10-CM

## 2011-08-08 DIAGNOSIS — Z01812 Encounter for preprocedural laboratory examination: Secondary | ICD-10-CM

## 2011-08-08 DIAGNOSIS — Z96659 Presence of unspecified artificial knee joint: Secondary | ICD-10-CM | POA: Diagnosis not present

## 2011-08-08 DIAGNOSIS — T84498A Other mechanical complication of other internal orthopedic devices, implants and grafts, initial encounter: Secondary | ICD-10-CM | POA: Diagnosis not present

## 2011-08-08 DIAGNOSIS — G8918 Other acute postprocedural pain: Secondary | ICD-10-CM | POA: Diagnosis not present

## 2011-08-08 DIAGNOSIS — Z96652 Presence of left artificial knee joint: Secondary | ICD-10-CM

## 2011-08-08 DIAGNOSIS — Y849 Medical procedure, unspecified as the cause of abnormal reaction of the patient, or of later complication, without mention of misadventure at the time of the procedure: Secondary | ICD-10-CM | POA: Diagnosis present

## 2011-08-08 DIAGNOSIS — T84039A Mechanical loosening of unspecified internal prosthetic joint, initial encounter: Secondary | ICD-10-CM | POA: Diagnosis not present

## 2011-08-08 HISTORY — PX: TOTAL KNEE REVISION: SHX996

## 2011-08-08 SURGERY — TOTAL KNEE REVISION
Anesthesia: General | Site: Knee | Laterality: Left | Wound class: Clean

## 2011-08-08 MED ORDER — ALIGN 4 MG PO CAPS: 1.0000 | ORAL_CAPSULE | Freq: Every day | ORAL | Status: DC

## 2011-08-08 MED ORDER — OXYCODONE-ACETAMINOPHEN 5-325 MG PO TABS
1.0000 | ORAL_TABLET | ORAL | Status: DC | PRN
  Administered 2011-08-08 – 2011-08-10 (×8): 2 via ORAL
  Filled 2011-08-08 (×9): qty 2

## 2011-08-08 MED ORDER — CEFUROXIME SODIUM 1.5 G IJ SOLR
INTRAMUSCULAR | Status: DC | PRN
  Administered 2011-08-08: 1.5 g

## 2011-08-08 MED ORDER — FENTANYL CITRATE 0.05 MG/ML IJ SOLN
INTRAMUSCULAR | Status: DC | PRN
  Administered 2011-08-08 (×2): 50 ug via INTRAVENOUS
  Administered 2011-08-08: 100 ug via INTRAVENOUS
  Administered 2011-08-08: 50 ug via INTRAVENOUS

## 2011-08-08 MED ORDER — SODIUM CHLORIDE 0.9 % IR SOLN
Status: DC | PRN
  Administered 2011-08-08: 1000 mL

## 2011-08-08 MED ORDER — ONDANSETRON HCL 4 MG/2ML IJ SOLN
4.0000 mg | Freq: Four times a day (QID) | INTRAMUSCULAR | Status: DC | PRN
  Administered 2011-08-09: 4 mg via INTRAVENOUS
  Filled 2011-08-08: qty 2

## 2011-08-08 MED ORDER — KETOROLAC TROMETHAMINE 30 MG/ML IJ SOLN
INTRAMUSCULAR | Status: DC | PRN
  Administered 2011-08-08: 30 mg via INTRAVENOUS

## 2011-08-08 MED ORDER — PROPOFOL 10 MG/ML IV EMUL
INTRAVENOUS | Status: DC | PRN
  Administered 2011-08-08: 200 mL via INTRAVENOUS

## 2011-08-08 MED ORDER — POTASSIUM CHLORIDE CRYS ER 20 MEQ PO TBCR
20.0000 meq | EXTENDED_RELEASE_TABLET | Freq: Every day | ORAL | Status: DC
  Administered 2011-08-08 – 2011-08-11 (×4): 20 meq via ORAL
  Filled 2011-08-08 (×5): qty 1

## 2011-08-08 MED ORDER — MAGNESIUM HYDROXIDE 400 MG/5ML PO SUSP
30.0000 mL | Freq: Every day | ORAL | Status: DC | PRN
  Administered 2011-08-10: 30 mL via ORAL
  Filled 2011-08-08: qty 30

## 2011-08-08 MED ORDER — ZOLPIDEM TARTRATE 5 MG PO TABS: 5.0000 mg | ORAL_TABLET | Freq: Every evening | ORAL | Status: DC | PRN

## 2011-08-08 MED ORDER — HYDROMORPHONE HCL PF 1 MG/ML IJ SOLN
0.2500 mg | INTRAMUSCULAR | Status: DC | PRN
  Administered 2011-08-08 (×2): 0.5 mg via INTRAVENOUS

## 2011-08-08 MED ORDER — METHOCARBAMOL 100 MG/ML IJ SOLN
500.0000 mg | INTRAVENOUS | Status: AC
  Administered 2011-08-08: 500 mg via INTRAVENOUS
  Filled 2011-08-08: qty 5

## 2011-08-08 MED ORDER — ACETAMINOPHEN 325 MG PO TABS: 650.0000 mg | ORAL_TABLET | Freq: Four times a day (QID) | ORAL | Status: DC | PRN

## 2011-08-08 MED ORDER — ACETAMINOPHEN 650 MG RE SUPP: 650.0000 mg | Freq: Four times a day (QID) | RECTAL | Status: DC | PRN

## 2011-08-08 MED ORDER — DRONEDARONE HCL 400 MG PO TABS
400.0000 mg | ORAL_TABLET | Freq: Two times a day (BID) | ORAL | Status: DC
  Administered 2011-08-08 – 2011-08-11 (×6): 400 mg via ORAL
  Filled 2011-08-08 (×8): qty 1

## 2011-08-08 MED ORDER — DEXTROSE 5 % IV SOLN
INTRAVENOUS | Status: DC | PRN
  Administered 2011-08-08: 12:00:00 via INTRAVENOUS

## 2011-08-08 MED ORDER — LIDOCAINE HCL (CARDIAC) 20 MG/ML IV SOLN
INTRAVENOUS | Status: DC | PRN
  Administered 2011-08-08: 100 mg via INTRAVENOUS

## 2011-08-08 MED ORDER — MENTHOL 3 MG MT LOZG: 1.0000 | LOZENGE | OROMUCOSAL | Status: DC | PRN

## 2011-08-08 MED ORDER — FLUOXETINE HCL 40 MG PO CAPS: 40.0000 mg | ORAL_CAPSULE | ORAL | Status: DC

## 2011-08-08 MED ORDER — DEXTROSE 5 % IV SOLN
INTRAVENOUS | Status: DC | PRN
  Administered 2011-08-08: 11:00:00 via INTRAVENOUS

## 2011-08-08 MED ORDER — BISACODYL 10 MG RE SUPP: 10.0000 mg | Freq: Every day | RECTAL | Status: DC | PRN

## 2011-08-08 MED ORDER — BUPIVACAINE HCL (PF) 0.5 % IJ SOLN
INTRAMUSCULAR | Status: DC | PRN
  Administered 2011-08-08: 25 mL

## 2011-08-08 MED ORDER — METOCLOPRAMIDE HCL 5 MG/ML IJ SOLN: 5.0000 mg | Freq: Three times a day (TID) | INTRAMUSCULAR | Status: DC | PRN

## 2011-08-08 MED ORDER — METHOCARBAMOL 100 MG/ML IJ SOLN
500.0000 mg | Freq: Four times a day (QID) | INTRAVENOUS | Status: DC | PRN
  Filled 2011-08-08: qty 5

## 2011-08-08 MED ORDER — ONDANSETRON HCL 4 MG/2ML IJ SOLN: 4.0000 mg | Freq: Once | INTRAMUSCULAR | Status: DC | PRN

## 2011-08-08 MED ORDER — ONDANSETRON HCL 4 MG/2ML IJ SOLN
INTRAMUSCULAR | Status: DC | PRN
  Administered 2011-08-08: 4 mg via INTRAVENOUS

## 2011-08-08 MED ORDER — ONDANSETRON HCL 4 MG PO TABS: 4.0000 mg | ORAL_TABLET | Freq: Four times a day (QID) | ORAL | Status: DC | PRN

## 2011-08-08 MED ORDER — LACTATED RINGERS IV SOLN
INTRAVENOUS | Status: DC
  Administered 2011-08-08: 11:00:00 via INTRAVENOUS

## 2011-08-08 MED ORDER — LACTATED RINGERS IV SOLN
INTRAVENOUS | Status: DC | PRN
  Administered 2011-08-08 (×2): via INTRAVENOUS

## 2011-08-08 MED ORDER — ALPRAZOLAM 0.5 MG PO TABS
0.5000 mg | ORAL_TABLET | Freq: Two times a day (BID) | ORAL | Status: DC
  Administered 2011-08-08 – 2011-08-11 (×6): 0.5 mg via ORAL
  Filled 2011-08-08 (×6): qty 1

## 2011-08-08 MED ORDER — WARFARIN - PHYSICIAN DOSING INPATIENT: Freq: Every day | Status: DC

## 2011-08-08 MED ORDER — FLEET ENEMA 7-19 GM/118ML RE ENEM: 1.0000 | ENEMA | Freq: Once | RECTAL | Status: AC | PRN

## 2011-08-08 MED ORDER — METHOCARBAMOL 500 MG PO TABS
500.0000 mg | ORAL_TABLET | Freq: Four times a day (QID) | ORAL | Status: DC | PRN
  Administered 2011-08-08 – 2011-08-09 (×2): 500 mg via ORAL
  Filled 2011-08-08 (×2): qty 1

## 2011-08-08 MED ORDER — PHENOL 1.4 % MT LIQD: 1.0000 | OROMUCOSAL | Status: DC | PRN

## 2011-08-08 MED ORDER — WARFARIN SODIUM 2.5 MG PO TABS
2.5000 mg | ORAL_TABLET | ORAL | Status: DC
  Administered 2011-08-08 – 2011-08-10 (×2): 2.5 mg via ORAL
  Filled 2011-08-08 (×4): qty 1

## 2011-08-08 MED ORDER — HYDROMORPHONE HCL PF 1 MG/ML IJ SOLN: 0.5000 mg | INTRAMUSCULAR | Status: DC | PRN

## 2011-08-08 MED ORDER — KCL IN DEXTROSE-NACL 20-5-0.45 MEQ/L-%-% IV SOLN
INTRAVENOUS | Status: DC
Start: 2011-08-08 — End: 2011-08-11
  Administered 2011-08-08 – 2011-08-09 (×3): via INTRAVENOUS
  Filled 2011-08-08 (×10): qty 1000

## 2011-08-08 MED ORDER — FLORA-Q PO CAPS
1.0000 | ORAL_CAPSULE | Freq: Every day | ORAL | Status: DC
  Administered 2011-08-09 – 2011-08-11 (×3): 1 via ORAL
  Filled 2011-08-08 (×3): qty 1

## 2011-08-08 MED ORDER — KCL IN DEXTROSE-NACL 20-5-0.45 MEQ/L-%-% IV SOLN
INTRAVENOUS | Status: AC
  Filled 2011-08-08: qty 1000

## 2011-08-08 MED ORDER — PROPRANOLOL HCL 20 MG PO TABS
20.0000 mg | ORAL_TABLET | Freq: Three times a day (TID) | ORAL | Status: DC
  Administered 2011-08-08 – 2011-08-11 (×9): 20 mg via ORAL
  Filled 2011-08-08 (×11): qty 1

## 2011-08-08 MED ORDER — DIPHENHYDRAMINE HCL 12.5 MG/5ML PO ELIX: 12.5000 mg | ORAL_SOLUTION | ORAL | Status: DC | PRN

## 2011-08-08 MED ORDER — ACETAMINOPHEN 10 MG/ML IV SOLN
INTRAVENOUS | Status: AC
  Filled 2011-08-08: qty 100

## 2011-08-08 MED ORDER — HYDROCODONE-ACETAMINOPHEN 5-325 MG PO TABS: 1.0000 | ORAL_TABLET | ORAL | Status: DC | PRN

## 2011-08-08 MED ORDER — WARFARIN SODIUM 5 MG PO TABS
5.0000 mg | ORAL_TABLET | ORAL | Status: AC
  Administered 2011-08-09: 5 mg via ORAL
  Filled 2011-08-08: qty 1

## 2011-08-08 MED ORDER — FLUOXETINE HCL 20 MG PO CAPS
40.0000 mg | ORAL_CAPSULE | Freq: Every day | ORAL | Status: DC
  Administered 2011-08-09 – 2011-08-11 (×3): 40 mg via ORAL
  Filled 2011-08-08 (×3): qty 2

## 2011-08-08 MED ORDER — FERROUS SULFATE 325 (65 FE) MG PO TABS
325.0000 mg | ORAL_TABLET | Freq: Every day | ORAL | Status: DC
  Administered 2011-08-09 – 2011-08-11 (×3): 325 mg via ORAL
  Filled 2011-08-08 (×4): qty 1

## 2011-08-08 MED ORDER — ACETAMINOPHEN 10 MG/ML IV SOLN
INTRAVENOUS | Status: DC | PRN
  Administered 2011-08-08: 1000 mg via INTRAVENOUS

## 2011-08-08 MED ORDER — METOCLOPRAMIDE HCL 10 MG PO TABS: 5.0000 mg | ORAL_TABLET | Freq: Three times a day (TID) | ORAL | Status: DC | PRN

## 2011-08-08 MED ORDER — ENOXAPARIN SODIUM 30 MG/0.3ML ~~LOC~~ SOLN
30.0000 mg | Freq: Two times a day (BID) | SUBCUTANEOUS | Status: DC
  Administered 2011-08-09 – 2011-08-10 (×3): 30 mg via SUBCUTANEOUS
  Filled 2011-08-08 (×6): qty 0.3

## 2011-08-08 SURGICAL SUPPLY — 67 items
ADAPTER BOLT FEMORAL +2/-2 (Knees) ×1 IMPLANT
ADPR FEM +2/-2 OFST BOLT (Knees) ×1 IMPLANT
ADPR FEM 5D STRL KN PFC SGM (Orthopedic Implant) ×1 IMPLANT
BANDAGE ELASTIC 4 VELCRO ST LF (GAUZE/BANDAGES/DRESSINGS) ×2 IMPLANT
BANDAGE ELASTIC 6 VELCRO ST LF (GAUZE/BANDAGES/DRESSINGS) ×2 IMPLANT
BANDAGE ESMARK 6X9 LF (GAUZE/BANDAGES/DRESSINGS) ×1 IMPLANT
BLADE SAG 18X100X1.27 (BLADE) ×2 IMPLANT
BLADE SAW SAG 90X13X1.27 (BLADE) ×2 IMPLANT
BLADE SAW SGTL 81X20 HD (BLADE) ×1 IMPLANT
BLADE SURG ROTATE 9660 (MISCELLANEOUS) IMPLANT
BNDG CMPR 9X6 STRL LF SNTH (GAUZE/BANDAGES/DRESSINGS) ×1
BNDG ESMARK 6X9 LF (GAUZE/BANDAGES/DRESSINGS) ×2
BOWL SMART MIX CTS (DISPOSABLE) IMPLANT
CEMENT HV SMART SET (Cement) ×2 IMPLANT
CLOTH BEACON ORANGE TIMEOUT ST (SAFETY) ×2 IMPLANT
COVER BACK TABLE 24X17X13 BIG (DRAPES) IMPLANT
COVER SURGICAL LIGHT HANDLE (MISCELLANEOUS) ×2 IMPLANT
CUFF TOURNIQUET SINGLE 34IN LL (TOURNIQUET CUFF) IMPLANT
CUFF TOURNIQUET SINGLE 44IN (TOURNIQUET CUFF) IMPLANT
DISC DIAMOND MED (BURR) IMPLANT
DRAPE EXTREMITY T 121X128X90 (DRAPE) ×2 IMPLANT
DRAPE U-SHAPE 47X51 STRL (DRAPES) ×2 IMPLANT
DURAPREP 26ML APPLICATOR (WOUND CARE) ×2 IMPLANT
ELECT REM PT RETURN 9FT ADLT (ELECTROSURGICAL) ×2
ELECTRODE REM PT RTRN 9FT ADLT (ELECTROSURGICAL) ×1 IMPLANT
EVACUATOR 1/8 PVC DRAIN (DRAIN) ×2 IMPLANT
FEMORAL ADAPTER (Orthopedic Implant) ×1 IMPLANT
GAUZE XEROFORM 1X8 LF (GAUZE/BANDAGES/DRESSINGS) ×2 IMPLANT
GLOVE BIO SURGEON STRL SZ7 (GLOVE) ×2 IMPLANT
GLOVE BIO SURGEON STRL SZ7.5 (GLOVE) ×2 IMPLANT
GLOVE BIOGEL PI IND STRL 7.0 (GLOVE) ×1 IMPLANT
GLOVE BIOGEL PI IND STRL 8 (GLOVE) ×1 IMPLANT
GLOVE BIOGEL PI INDICATOR 7.0 (GLOVE) ×1
GLOVE BIOGEL PI INDICATOR 8 (GLOVE) ×1
GOWN PREVENTION PLUS XLARGE (GOWN DISPOSABLE) ×2 IMPLANT
GOWN STRL NON-REIN LRG LVL3 (GOWN DISPOSABLE) ×2 IMPLANT
HANDPIECE INTERPULSE COAX TIP (DISPOSABLE)
HOOD PEEL AWAY FACE SHEILD DIS (HOOD) ×6 IMPLANT
IMPL FEMUR SIGMA LT PS SZ 3 (Knees) IMPLANT
IMPLANT FEMUR SIGMA LT PS SZ 3 (Knees) ×2 IMPLANT
INSERT TIBIAL PFC SIG SZ3 10MM (Knees) ×1 IMPLANT
KIT BASIN OR (CUSTOM PROCEDURE TRAY) ×2 IMPLANT
KIT ROOM TURNOVER OR (KITS) ×2 IMPLANT
MANIFOLD NEPTUNE II (INSTRUMENTS) ×2 IMPLANT
NS IRRIG 1000ML POUR BTL (IV SOLUTION) ×2 IMPLANT
PACK TOTAL JOINT (CUSTOM PROCEDURE TRAY) ×2 IMPLANT
PAD ARMBOARD 7.5X6 YLW CONV (MISCELLANEOUS) ×4 IMPLANT
PAD CAST 4YDX4 CTTN HI CHSV (CAST SUPPLIES) ×1 IMPLANT
PADDING CAST COTTON 4X4 STRL (CAST SUPPLIES) ×2
PADDING CAST COTTON 6X4 STRL (CAST SUPPLIES) ×2 IMPLANT
RASP HELIOCORDIAL MED (MISCELLANEOUS) IMPLANT
SET HNDPC FAN SPRY TIP SCT (DISPOSABLE) IMPLANT
SPONGE GAUZE 4X4 12PLY (GAUZE/BANDAGES/DRESSINGS) ×3 IMPLANT
STAPLER VISISTAT 35W (STAPLE) ×2 IMPLANT
STEM UNIV REV 115 X 6 FLUTED (Stem) ×1 IMPLANT
SUCTION FRAZIER TIP 10 FR DISP (SUCTIONS) ×2 IMPLANT
SUT VIC AB 0 CT1 27 (SUTURE) ×2
SUT VIC AB 0 CT1 27XBRD ANBCTR (SUTURE) ×1 IMPLANT
SUT VIC AB 1 CTX 36 (SUTURE) ×2
SUT VIC AB 1 CTX36XBRD ANBCTR (SUTURE) ×1 IMPLANT
SUT VIC AB 2-0 CT1 27 (SUTURE) ×2
SUT VIC AB 2-0 CT1 TAPERPNT 27 (SUTURE) ×1 IMPLANT
TOWEL OR 17X24 6PK STRL BLUE (TOWEL DISPOSABLE) ×2 IMPLANT
TOWEL OR 17X26 10 PK STRL BLUE (TOWEL DISPOSABLE) ×2 IMPLANT
TRAY FOLEY CATH 14FR (SET/KITS/TRAYS/PACK) IMPLANT
TUBE ANAEROBIC SPECIMEN COL (MISCELLANEOUS) ×2 IMPLANT
WATER STERILE IRR 1000ML POUR (IV SOLUTION) ×6 IMPLANT

## 2011-08-08 NOTE — Progress Notes (Signed)
Orthopedic Tech Progress Note Patient Details:  [name] [name] [birth year] [id number]  Patient ID: [name] [name], male   DOB: [birth year], 71 y.o.   MRN: [id number]   Shawnie Pons 08/08/2011, 4:43 PM Trapeze bar

## 2011-08-08 NOTE — Anesthesia Postprocedure Evaluation (Signed)
  Anesthesia Post-op Note  Patient: [name] [name]  Procedure(s) Performed: Procedure(s) (LRB): TOTAL KNEE REVISION (Left)  Patient Location: PACU  Anesthesia Type: General and GA combined with regional for post-op pain  Level of Consciousness: awake, alert  and oriented  Airway and Oxygen Therapy: Patient Spontanous Breathing and Patient connected to nasal cannula oxygen  Post-op Pain: mild  Post-op Assessment: Post-op Vital signs reviewed and Patient's Cardiovascular Status Stable  Post-op Vital Signs: stable  Complications: No apparent anesthesia complications 

## 2011-08-08 NOTE — Anesthesia Procedure Notes (Signed)
Anesthesia Regional Block:  Femoral nerve block  Pre-Anesthetic Checklist: ,, timeout performed, Correct Patient, Correct Site, Correct Laterality, Correct Procedure, Correct Position, site marked, Risks and benefits discussed,  Surgical consent,  Pre-op evaluation,  At surgeon's request and post-op pain management  Laterality: Left  Prep: Maximum Sterile Barrier Precautions used and chloraprep       Needles:  Injection technique: Single-shot  Needle Type: Stimulator Needle - 80        Needle insertion depth: 6 cm   Additional Needles:  Procedures: nerve stimulator Femoral nerve block  Nerve Stimulator or Paresthesia:  Response: 0.5 mA, 0.1 ms, 6 cm  Additional Responses:   Narrative:  Start time: 08/08/2011 10:35 AM End time: 08/08/2011 10:40 AM Injection made incrementally with aspirations every 5 mL.  Performed by: Personally  Anesthesiologist: Maren Beach MD  Additional Notes: 25 cc 0.5% Marcaine w/ epi w/o difficulty or discomfort. GES

## 2011-08-08 NOTE — Anesthesia Preprocedure Evaluation (Addendum)
Anesthesia Evaluation  Patient identified by MRN, date of birth, ID band Patient awake    Reviewed: Allergy & Precautions, H&P , NPO status , Patient's Chart, lab work & pertinent test results  History of Anesthesia Complications (+) PONV and DIFFICULT AIRWAY  Airway Mallampati: II TM Distance: <3 FB Neck ROM: Full    Dental  (+) Teeth Intact   Pulmonary    Pulmonary exam normal       Cardiovascular Exercise Tolerance: Good hypertension, Pt. on medications and Pt. on home beta blockers + dysrhythmias Atrial Fibrillation - Valvular Problems/MurmursRate:Normal     Neuro/Psych PSYCHIATRIC DISORDERS Anxiety Depression    GI/Hepatic negative GI ROS, Neg liver ROS,   Endo/Other  negative endocrine ROS  Renal/GU negative Renal ROS     Musculoskeletal negative musculoskeletal ROS (+) Arthritis -, Osteoarthritis,    Abdominal Normal abdominal exam  (+)   Peds  Hematology negative hematology ROS (+)   Anesthesia Other Findings Multiple Crowns lateral & molar areas  Reproductive/Obstetrics                        Anesthesia Physical Anesthesia Plan  ASA: III  Anesthesia Plan: General   Post-op Pain Management:    Induction: Intravenous  Airway Management Planned: LMA  Additional Equipment:   Intra-op Plan:   Post-operative Plan: Extubation in OR  Informed Consent: I have reviewed the patients History and Physical, chart, labs and discussed the procedure including the risks, benefits and alternatives for the proposed anesthesia with the patient or authorized representative who has indicated his/her understanding and acceptance.   Dental advisory given  Plan Discussed with: CRNA, Surgeon and Anesthesiologist  Anesthesia Plan Comments:        Anesthesia Quick Evaluation

## 2011-08-08 NOTE — Progress Notes (Signed)
Patient reports minimal knee pain  BP 131/80  Pulse 60  Temp(Src) 98.2 F (36.8 C) (Oral)  Resp 16  Ht 5\' 7"  (1.702 m)  Wt 87.213 kg (192 lb 4.3 oz)  BMI 30.11 kg/m2  SpO2 100%  Looks  Dressing intact NVI  Hg 10.6  POD #1 after left TKA revision  - drain d/c'ed - up with PT - d/c PCA and foley - change dressing tomorrow

## 2011-08-08 NOTE — Interval H&P Note (Signed)
History and Physical Interval Note:  08/08/2011 11:07 AM  [name] [name]  has presented today for surgery, with the diagnosis of PAINFUL LEFT TOTAL KNEE  The various methods of treatment have been discussed with the patient and family. After consideration of risks, benefits and other options for treatment, the patient has consented to  Procedure(s) (LRB): TOTAL KNEE REVISION (Left) as a surgical intervention .  The patients' history has been reviewed, patient examined, no change in status, stable for surgery.  I have reviewed the patients' chart and labs.  Questions were answered to the patient's satisfaction.     Nestor Lewandowsky

## 2011-08-08 NOTE — Progress Notes (Signed)
Orthopedic Tech Progress Note Patient Details:  [name] [name] 08-03-1940 [id number]  CPM Left Knee CPM Left Knee: On Left Knee Flexion (Degrees): 30  Left Knee Extension (Degrees): 0    Shawnie Pons 08/08/2011, 4:43 PM

## 2011-08-08 NOTE — Transfer of Care (Signed)
Immediate Anesthesia Transfer of Care Note  Patient: [name] [name]  Procedure(s) Performed: Procedure(s) (LRB): TOTAL KNEE REVISION (Left)  Patient Location: PACU  Anesthesia Type: General  Level of Consciousness: awake, alert , oriented and patient cooperative  Airway & Oxygen Therapy: Patient Spontanous Breathing and Patient connected to nasal cannula oxygen  Post-op Assessment: Report given to PACU RN and Post -op Vital signs reviewed and stable  Post vital signs: Reviewed and stable  Complications: No apparent anesthesia complications

## 2011-08-08 NOTE — Op Note (Signed)
PATIENT ID:      [name] [name]  MRN:     [id number] DOB/AGE:    06-22-1940 / 71 y.o.       OPERATIVE REPORT    DATE OF PROCEDURE:  08/08/2011       PREOPERATIVE DIAGNOSIS:   PAINFUL LEFT TOTAL KNEE      Estimated Body mass index is 29.57 kg/(m^2) as calculated from the following:   Height as of 06/03/11: 5\' 7" (1.702 m).   Weight as of 06/03/11: 188 lb 12.8 oz(85.639 kg).                                                        POSTOPERATIVE DIAGNOSIS:   Painful left total knee with loose femoral component                                                                      PROCEDURE:  Procedure(s): Revision left total knee arthroplasty with removal of loose femoral component and tibial bearing and revision to a #3 left stemmed diffuse Sigma RP component stem 14 x 1 10. NuvaRing was a 10 mm #3 Sigma RP bearing     SURGEON: [name],[name]    ASSISTANT:   Shirl Harris PA-C   (Present and scrubbed throughout the case, critical for assistance with exposure, retraction, instrumentation, and closure.)         ANESTHESIA: GA combined with regional for post-op pain   DRAINS: foley, 2 medium hemovac in knee   TOURNIQUET TIME: * Missing tourniquet times found for documented tourniquets in log:  [id number] *    COMPLICATIONS: Synovium for Gram stain and culture.  INDICATIONS FOR PROCEDURE: The patient has  PAINFUL LEFT TOTAL KNEE, x-ray shows lucency behind the femoral condyles, bone scan is hot on the femoral component less so the patellar component. Patient has failed all conservative measures including anti-inflammatory medicines, narcotics, pain wakes him up at night limits ambulation and overall he is miserable. He understands that the chance of finding a loose component is better than 50% but not perfect based on the bone scan. His x-rays are nondiagnostic.Marland Kitchen  Risks and benefits of surgery have been discussed, questions answered.   DESCRIPTION OF PROCEDURE: The patient identified by armband,  received  right femoral nerve block and IV antibiotics, in the holding area at [facility]. Patient taken to the operating room, appropriate anesthetic  monitors were attached General endotracheal anesthesia induced with  the patient in supine position, Foley catheter was inserted. Tourniquet  applied high to the operative thigh. Lateral post and foot positioner  applied to the table, the lower extremity was then prepped and draped  in usual sterile fashion from the ankle to the tourniquet. Time-out procedure was performed. The limb was wrapped with an Esmarch bandage and the tourniquet inflated to 350 mmHg. We began the operation by making the anterior midline incision starting at  handbreadth above the patella going over the patella 1 cm medial to and  4 cm distal to the tibial tubercle. Small bleeders in the skin and the  subcutaneous tissue identified and cauterized. Transverse retinaculum was incised and reflected medially and a medial parapatellar arthrotomy was accomplished. the patella was everted and theprepatellar fat pad resected. The superficial medial collateral  ligament was then elevated from anterior to posterior along the proximal  flare of the tibia and anterior half of the menisci resected. The knee was hyperflexed exposing patellar femoral and tibial components. We continued to  work our way around posteriorly along the proximal tibia, and externally  rotated the tibia subluxing it out from underneath the femur. The old 12.5 mm tibial bearing was then removed. An osteotome was placed on the tibial component which appear to be well fixed we tapped and there was no loosening noted. We worked our way around the patellar component removing scar tissue and likewise placed an osteotome underneath the patellar component it  was also well fixed. The femoral extractor was placed on the femoral component and with simple manual varus and valgus stress it was in fact noted to be loose.  Using a combination of medial and lateral osteotomes we then brought the interface between the metal cement and bone and extracted the #3 femoral component without difficulty noticed a fibrous membrane between the bone and cement indicative of loosening. We then aimed up to a 14 mm hand reamer to the appropriate depth for a 110 stem freshened up our distal femoral cut with a cutting guide as well as the chamfer cuts with the stem the 2 mm anterior on the cutting guide. We then assembled a #3 left femoral trial with a 14 mm x 1 10 stem 2 mm anterior. This was inserted without difficulty followed by a 10 mm Sigma RP trial bearing the knee was reduced and noted to have excellent stability and range of motion which was only 15-90 and is now full extension 230. At this point the trial components removed all bony surfaces were waterpicked clean. The bony surfaces were then dried with sponges a- double batch of DePuy HV cement was then mixed with 1500 mg of Zinacef and applied to all bony and metallic mating surfaces except for the posterior condyles of the femur itself we then inserted the 3 left femoral component with a 14 mm x 110 mm stem and removed excess cement. A 10 mm bearing was inserted and the knee was reduced and held in extension as the cement cured. We then took the knee through range of motion from 0-130 without difficulty, inserted Hemovac drain from the lateral side and once again pulse lavaged the wound clean we then closed with running #1 Vicryl suture in the parapatellar arthrotomy, 0 Vicryl suture the subcutaneous tissue, and 2-0 Vicryl in the subcutaneous tissue the skin was closed with skin staples and a dressing of 0 from 4 x 4 dressing sponges web roll and Ace wrap applied. The tourniquet was let down at 1 hour and 45 minutes. The patient was awakened, extubated, and taken to recovery room without difficulty.   [name] 08/08/2011, 1:23 PM

## 2011-08-08 NOTE — Preoperative (Signed)
Beta Blockers   Took Inderal 20 mgs @   5:30    today

## 2011-08-09 DIAGNOSIS — T8484XA Pain due to internal orthopedic prosthetic devices, implants and grafts, initial encounter: Secondary | ICD-10-CM

## 2011-08-09 LAB — CBC
HCT: 32 % — ABNORMAL LOW (ref 39.0–52.0)
Hemoglobin: 10.6 g/dL — ABNORMAL LOW (ref 13.0–17.0)
MCHC: 33.1 g/dL (ref 30.0–36.0)
WBC: 6.1 10*3/uL (ref 4.0–10.5)

## 2011-08-09 LAB — BASIC METABOLIC PANEL
BUN: 16 mg/dL (ref 6–23)
Chloride: 102 mEq/L (ref 96–112)
Glucose, Bld: 105 mg/dL — ABNORMAL HIGH (ref 70–99)
Potassium: 4.5 mEq/L (ref 3.5–5.1)

## 2011-08-09 NOTE — Progress Notes (Signed)
CSW received consult for SNF. PT recommendation for HHPT noted. No other CSW needs reported or noted. CSW signing off. Please re-consult if SNF needed.  Dellie Burns, MSW, [place] [phone] (weekend)

## 2011-08-09 NOTE — Anesthesia Postprocedure Evaluation (Signed)
  Anesthesia Post-op Note  Patient: [name] [name]  Procedure(s) Performed: Procedure(s) (LRB): TOTAL KNEE REVISION (Left)  Patient Location: PACU  Anesthesia Type: General and GA combined with regional for post-op pain  Level of Consciousness: awake, alert  and oriented  Airway and Oxygen Therapy: Patient Spontanous Breathing and Patient connected to nasal cannula oxygen  Post-op Pain: mild  Post-op Assessment: Post-op Vital signs reviewed and Patient's Cardiovascular Status Stable  Post-op Vital Signs: stable  Complications: No apparent anesthesia complications

## 2011-08-09 NOTE — Progress Notes (Signed)
Physical Therapy Treatment Patient Details Name: [name] [name] MRN: [id number] DOB: 08-21-1940 Today's Date: 08/09/2011  PT Assessment/Plan  PT - Assessment/Plan Comments on Treatment Session: Pt tolerated Rx well. Increased gait distance this PM. PT Plan: Discharge plan remains appropriate;Frequency remains appropriate PT Frequency: 7X/week Follow Up Recommendations: Home health PT Equipment Recommended: None recommended by PT PT Goals  Acute Rehab PT Goals PT Goal Formulation: With patient Time For Goal Achievement: 7 days Pt will go Supine/Side to Sit: with modified independence PT Goal: Supine/Side to Sit - Progress: Progressing toward goal Pt will go Sit to Supine/Side: with modified independence PT Goal: Sit to Supine/Side - Progress: Progressing toward goal Pt will go Sit to Stand: with modified independence PT Goal: Sit to Stand - Progress: Progressing toward goal Pt will go Stand to Sit: with modified independence PT Goal: Stand to Sit - Progress: Progressing toward goal Pt will Transfer Bed to Chair/Chair to Bed: with modified independence PT Transfer Goal: Bed to Chair/Chair to Bed - Progress: Progressing toward goal Pt will Ambulate: >150 feet;with modified independence;with rolling walker PT Goal: Ambulate - Progress: Progressing toward goal Pt will Go Up / Down Stairs: 3-5 stairs;with supervision;with rail(s) PT Goal: Up/Down Stairs - Progress: Progressing toward goal Pt will Perform Home Exercise Program: Independently PT Goal: Perform Home Exercise Program - Progress: Progressing toward goal  PT Treatment Precautions/Restrictions  Precautions Precautions: Knee Required Braces or Orthoses: No Restrictions Weight Bearing Restrictions: Yes LLE Weight Bearing: Weight bearing as tolerated Mobility (including Balance) Bed Mobility Bed Mobility: Yes Supine to Sit: 4: Min assist;HOB elevated (Comment degrees) (30 degrees) Supine to Sit Details (indicate cue type  and reason): min guard assist Sit to Supine: 4: Min assist;HOB elevated (comment degrees) (30 degrees) Sit to Supine - Details (indicate cue type and reason): assist for LLE Transfers Transfers: Yes Sit to Stand: 4: Min assist;From bed;With upper extremity assist Sit to Stand Details (indicate cue type and reason): verbal cues for hand placement Stand to Sit: 4: Min assist;To bed;With upper extremity assist Stand to Sit Details: min guard assist, verbal cues for sequencing Ambulation/Gait Ambulation/Gait: Yes Ambulation/Gait Assistance: 4: Min assist Ambulation/Gait Assistance Details (indicate cue type and reason): min guard assist, L knee buckling x 1 with min assist to recover Ambulation Distance (Feet): 120 Feet Assistive device: Rolling walker Gait Pattern: Decreased stance time - left;Antalgic Gait velocity: decreased    Exercise  Total Joint Exercises Ankle Circles/Pumps: AROM;Both;10 reps;Supine Quad Sets: AROM;Left;10 reps;Supine Heel Slides: AAROM;Left;10 reps;Supine Hip ABduction/ADduction: AAROM;Left;10 reps;Supine Knee Flexion: AAROM;Left;Supine (5-45 degrees) End of Session PT - End of Session Equipment Utilized During Treatment: Gait belt Activity Tolerance: Patient tolerated treatment well Patient left: in bed;in CPM;with call bell in reach Nurse Communication: Mobility status for transfers;Mobility status for ambulation General Behavior During Session: [facility] for tasks performed Cognition: [facility] for tasks performed  Ilda Foil 08/09/2011, 3:11 PM  Aida Raider, PT  Office # [phone] Pager [phone]

## 2011-08-09 NOTE — Evaluation (Signed)
Physical Therapy Evaluation Patient Details Name: [name] [name] MRN: [id number] DOB: 1941/05/24 Today's Date: 08/09/2011  Problem List:  Patient Active Problem List  Diagnoses  . HYPERTENSION, BENIGN  . ATRIAL FIBRILLATION  . HEADACHE  . CARDIAC PACEMAKER IN SITU  . Long term current use of anticoagulant  . Pain due to total left knee replacement    Past Medical History:  Past Medical History  Diagnosis Date  . Paroxysmal atrial fibrillation   . HTN (hypertension)   . Hyperlipidemia   . Migraines   . Arthritis of knee, right     end-stage  . Bursitis   . Gout   . Tinnitus   . Depression   . Bronchitis   . Heart murmur   . Sleep apnea     wnl- sleep study, done 2004  . Anemia     since post op- knee replacement   . Complication of anesthesia     irritation in throat postop, makes a-fib worse   . DJD (degenerative joint disease)     OA- "everywhere"  . Anxiety     takes xanax 2 times per day, taken mostly for ringing in ears     Past Surgical History:  Past Surgical History  Procedure Date  . Transesophageal echocardiogram 04/2008  . Transthoracic echocardiogram 2006,2009  . Bilteral thumb joint surgrery   . Right rotator cuff surgery 2006  . Cholecystectomy   . Rhinoplasty   . Uvulectomy   . Appendectomy   . Pacemaker insertion 06/06/2005    Medtronic EnRhythm dual-chamber. For sick sinus syndrome  . Right total hip arthroplasty   . Left rotator cuff repair   . Insert / replace / remove pacemaker     Medtronic   . Cardiac catheterization 2005     which revealed 40% stenosis of the mid left anterior descending artery., ablation- 2009  . Tonsillectomy     as a child  . Joint replacement     L knee- 12/11,R hip- 1996    PT Assessment/Plan/Recommendation PT Assessment Clinical Impression Statement: Pt is a 71 y.o. male who underwent revision of L TKA 08-08-11.  His original sx was approx 1 year ago.  He also required going back to OR 3 months after that  sx for manipulation.  PT intervention indicated to increase ROM/strength LLE, progress mobility/gait, and provide education for safe d/c home with LTG of independence. PT Recommendation/Assessment: Patient will need skilled PT in the acute care venue PT Problem List: Decreased strength;Decreased range of motion;Decreased mobility;Decreased activity tolerance;Pain Barriers to Discharge: None PT Therapy Diagnosis : Difficulty walking;Acute pain PT Plan PT Frequency: 7X/week PT Treatment/Interventions: DME instruction;Gait training;Stair training;Functional mobility training;Therapeutic activities;Therapeutic exercise;Patient/family education PT Recommendation Follow Up Recommendations: Home health PT Equipment Recommended: None recommended by PT PT Goals  Acute Rehab PT Goals PT Goal Formulation: With patient Time For Goal Achievement: 7 days Pt will go Supine/Side to Sit: with modified independence PT Goal: Supine/Side to Sit - Progress: Goal set today Pt will go Sit to Supine/Side: with modified independence PT Goal: Sit to Supine/Side - Progress: Goal set today Pt will go Sit to Stand: with modified independence PT Goal: Sit to Stand - Progress: Goal set today Pt will go Stand to Sit: with modified independence PT Goal: Stand to Sit - Progress: Goal set today Pt will Transfer Bed to Chair/Chair to Bed: with modified independence PT Transfer Goal: Bed to Chair/Chair to Bed - Progress: Goal set today Pt will Ambulate: >150 feet;with  modified independence;with rolling walker PT Goal: Ambulate - Progress: Goal set today Pt will Go Up / Down Stairs: 3-5 stairs;with supervision;with rail(s) PT Goal: Up/Down Stairs - Progress: Goal set today Pt will Perform Home Exercise Program: Independently PT Goal: Perform Home Exercise Program - Progress: Goal set today  PT Evaluation Precautions/Restrictions  Precautions Precautions: Knee Required Braces or Orthoses: No Restrictions LLE Weight  Bearing: Weight bearing as tolerated Prior Functioning  Home Living Lives With: Spouse Receives Help From: Family Type of Home: House Home Layout: Two level;Able to live on main level with bedroom/bathroom Home Access: Stairs to enter Entrance Stairs-Rails: Right Entrance Stairs-Number of Steps: 3 Home Adaptive Equipment: Walker - rolling Prior Function Level of Independence: Independent with basic ADLs;Independent with homemaking with ambulation;Independent with gait;Independent with transfers Vocation: Part time employment Cognition Cognition Arousal/Alertness: Awake/alert Overall Cognitive Status: Appears within functional limits for tasks assessed Orientation Level: Oriented X4 Sensation/Coordination   Extremity Assessment   Mobility (including Balance) Bed Mobility Bed Mobility: Yes Supine to Sit: 4: Min assist;HOB elevated (Comment degrees);With rails (20 degrees) Supine to Sit Details (indicate cue type and reason): verbal cues for technique Transfers Transfers: Yes Sit to Stand: 4: Min assist;From bed;With upper extremity assist Sit to Stand Details (indicate cue type and reason): verbal cues for hand placement Stand to Sit: 4: Min assist Stand to Sit Details: verbal cues for sequencing Ambulation/Gait Ambulation/Gait: Yes Ambulation/Gait Assistance: 4: Min assist Ambulation/Gait Assistance Details (indicate cue type and reason): min guard assist, verbal cues for sequencing Ambulation Distance (Feet): 90 Feet Assistive device: Rolling walker Gait Pattern: Step-to pattern;Decreased stance time - left;Antalgic Gait velocity: decreased    Exercise  Total Joint Exercises Ankle Circles/Pumps: AROM;Both;10 reps Knee Flexion: AAROM;Left;Supine (5-45 degrees) End of Session PT - End of Session Equipment Utilized During Treatment: Gait belt Activity Tolerance: Patient tolerated treatment well Patient left: in chair;with call bell in reach Nurse Communication:  Mobility status for transfers;Mobility status for ambulation General Behavior During Session: [facility] for tasks performed Cognition: [facility] for tasks performed  Ilda Foil 08/09/2011, 12:17 PM  Aida Raider, PT  Office # [phone] Pager [phone]

## 2011-08-10 ENCOUNTER — Encounter (HOSPITAL_COMMUNITY): Payer: Self-pay

## 2011-08-10 LAB — CBC
HCT: 28.2 % — ABNORMAL LOW (ref 39.0–52.0)
Hemoglobin: 9.4 g/dL — ABNORMAL LOW (ref 13.0–17.0)
MCV: 90.4 fL (ref 78.0–100.0)
RBC: 3.12 MIL/uL — ABNORMAL LOW (ref 4.22–5.81)
WBC: 6 10*3/uL (ref 4.0–10.5)

## 2011-08-10 NOTE — Progress Notes (Signed)
Chart reviewed, spoke with pt. And spouse.  No OT needs identified.  Will sign off.  Thank you for the referral.  Jeani Hawking, OTR/L [phone]

## 2011-08-10 NOTE — Progress Notes (Signed)
Patient reports minimal knee pain   BP 139/75  Pulse 58  Temp(Src) 98.4 F (36.9 C) (Oral)  Resp 20  Ht 5\' 7"  (1.702 m)  Wt 87.213 kg (192 lb 4.3 oz)  BMI 30.11 kg/m2  SpO2 99%   Looks great, comfortable Wound CDI NVI  Hg 9.4  POD #2 after left TKA revision  Continue PT Likely d/c home tomorrow

## 2011-08-10 NOTE — Progress Notes (Signed)
PT Progress Note:     08/10/11 1000  PT Visit Information  Last PT Received On 08/10/11  Precautions  Precautions Knee  Required Braces or Orthoses No  Restrictions  LLE Weight Bearing WBAT  Bed Mobility  Bed Mobility Yes  Supine to Sit 6: Modified independent (Device/Increase time);HOB flat  Sit to Supine 6: Modified independent (Device/Increase time);HOB flat  Transfers  Sit to Stand 5: Supervision;From bed;With upper extremity assist  Sit to Stand Details (indicate cue type and reason) cues for hand placement   Stand to Sit To bed;With upper extremity assist;5: Supervision  Stand to Sit Details cues for hand placement  Ambulation/Gait  Ambulation/Gait Yes  Ambulation/Gait Assistance Other (comment) (Min Guard (A))  Ambulation/Gait Assistance Details (indicate cue type and reason) Guarding for safety; cues for sequencing, encouragement to progress from step-to pattern to step-through pattern & to decrease reliance of UE's on RW.    Ambulation Distance (Feet) 160 Feet  Assistive device Rolling walker  Gait Pattern Decreased stance time - left  Stairs No  Posture/Postural Control  Posture/Postural Control No significant limitations  Balance  Balance Assessed No  Exercises  Exercises Total Joint  Total Joint Exercises  Ankle Circles/Pumps AROM;Both;10 reps;Supine  Quad Sets AROM;Left;10 reps;Supine  Long Arc Quad Left;Strengthening;10 reps;Seated  PT - End of Session  Equipment Utilized During Treatment Gait belt  Activity Tolerance Patient tolerated treatment well  Patient left in bed;in CPM;with call bell in reach  Nurse Communication Mobility status for transfers;Mobility status for ambulation  General  Behavior During Session [facility] for tasks performed  Cognition [facility] for tasks performed  PT - Assessment/Plan  Comments on Treatment Session Pt continues to progress & perform well.  Placed on CPM at end of session- 0-55 degrees.    PT Plan Discharge plan remains  appropriate;Frequency remains appropriate  PT Frequency 7X/week  Follow Up Recommendations Home health PT  Equipment Recommended None recommended by PT  Acute Rehab PT Goals  PT Goal: Supine/Side to Sit - Progress Met  PT Goal: Sit to Supine/Side - Progress Met  PT Goal: Sit to Stand - Progress Progressing toward goal  PT Goal: Stand to Sit - Progress Progressing toward goal  PT Goal: Ambulate - Progress Progressing toward goal      Verdell Face, PTA [phone] 08/10/2011

## 2011-08-10 NOTE — Progress Notes (Signed)
Physical Therapy Note   08/10/11 1558  PT Visit Information  Last PT Received On 08/10/11  Precautions  Precautions Knee  Restrictions  LLE Weight Bearing WBAT  Bed Mobility  Supine to Sit 6: Modified independent (Device/Increase time);HOB flat  Sit to Supine 6: Modified independent (Device/Increase time);HOB flat  Transfers  Sit to Stand 5: Supervision;From bed;With upper extremity assist  Sit to Stand Details (indicate cue type and reason) cues for hand placement  Stand to Sit 5: Supervision;To bed;With upper extremity assist  Stand to Sit Details cues for hand placement; highlighted that when only one hand is on RW, there is greater risk of RW tipping if pt isn't paying attention  Ambulation/Gait  Ambulation/Gait Yes  Ambulation/Gait Assistance 5: Supervision  Ambulation/Gait Assistance Details (indicate cue type and reason) cues fro sequence; good progress into step-through pattern  Ambulation Distance (Feet) 220 Feet  Assistive device Rolling walker  Gait velocity decreased  Stairs Yes  Stairs Assistance 4: Min assist  Stairs Assistance Details (indicate cue type and reason) verbal and demo cues for technique, and sequence  Stair Management Technique One rail Left;Sideways  Number of Stairs 2   Exercises  Exercises Total Joint  Total Joint Exercises  Ankle Circles/Pumps AROM;Both;10 reps;Supine  Quad Sets AROM;Left;10 reps;Supine  Heel Slides AAROM;Left;10 reps;Supine  Knee Flexion AAROM;Left;Supine (2-85 degrees by visual estimate)  Short Arc Quad AAROM;AROM;Left;10 reps;Supine (progressed to AROM)  Straight Leg Raises AAROM;Left;10 reps;Supine  PT - End of Session  Equipment Utilized During Treatment Gait belt  Activity Tolerance Patient tolerated treatment well  Patient left in bed;in CPM;with call bell in reach  Nurse Communication Mobility status for transfers;Mobility status for ambulation  General  Behavior During Session [facility] for tasks performed  Cognition  [facility] for tasks performed  PT - Assessment/Plan  Comments on Treatment Session Continuing good progress; on track for dc tomorrow from a PT standpoint  PT Plan Discharge plan remains appropriate;Frequency remains appropriate  PT Frequency 7X/week  Follow Up Recommendations Home health PT  Equipment Recommended None recommended by PT  Acute Rehab PT Goals  PT Goal Formulation With patient  Time For Goal Achievement 7 days  Pt will go Supine/Side to Sit with modified independence  PT Goal: Supine/Side to Sit - Progress Met  Pt will go Sit to Supine/Side with modified independence  PT Goal: Sit to Supine/Side - Progress Met  Pt will go Sit to Stand with modified independence  PT Goal: Sit to Stand - Progress Progressing toward goal  Pt will go Stand to Sit with modified independence  PT Goal: Stand to Sit - Progress Progressing toward goal  Pt will Ambulate >150 feet;with modified independence;with rolling walker  PT Goal: Ambulate - Progress Progressing toward goal  Pt will Go Up / Down Stairs 3-5 stairs;with supervision;with rail(s)  PT Goal: Up/Down Stairs - Progress Progressing toward goal  Pt will Perform Home Exercise Program Independently  PT Goal: Perform Home Exercise Program - Progress Progressing toward goal    [place], [place] [phone]

## 2011-08-11 LAB — TYPE AND SCREEN
ABO/RH(D): O POS
Unit division: 0
Unit division: 0
Unit division: 0

## 2011-08-11 LAB — CBC
MCH: 30.8 pg (ref 26.0–34.0)
MCHC: 34.3 g/dL (ref 30.0–36.0)
Platelets: 151 10*3/uL (ref 150–400)
RBC: 3.18 MIL/uL — ABNORMAL LOW (ref 4.22–5.81)

## 2011-08-11 LAB — PROTIME-INR: Prothrombin Time: 20.7 seconds — ABNORMAL HIGH (ref 11.6–15.2)

## 2011-08-11 MED ORDER — OXYCODONE-ACETAMINOPHEN 5-325 MG PO TABS: 1.0000 | ORAL_TABLET | ORAL | Status: AC | PRN

## 2011-08-11 MED ORDER — METHOCARBAMOL 500 MG PO TABS: 500.0000 mg | ORAL_TABLET | Freq: Four times a day (QID) | ORAL | Status: AC | PRN

## 2011-08-11 NOTE — Progress Notes (Signed)
CARE MANAGEMENT NOTE 08/11/2011    Subjective/Objective Assessment:   71 yr old male s/p left knee revision     Action/Plan:   [place] with patient and wife. Preoperatively setup with [facility], no changes. Have rolling walker and 3in1. CPM to be delivered this afternoon by [employer].   Anticipated DC Date:  08/11/2011   Anticipated DC Plan:  HOME W HOME HEALTH SERVICES      DC Planning Services  CM consult      [facility] Choice  HOME HEALTH   Choice offered to / List presented to:  C-1 Patient        HH arranged  HH-2 PT  HH-1 RN      Status of service:  Completed, signed off Discharge Disposition:  HOME W HOME HEALTH SERVICES

## 2011-08-11 NOTE — Discharge Summary (Signed)
Patient ID: [name] [name] MRN: [id number] DOB/AGE: [birth year] 71 y.o.  Admit date: 08/08/2011 Discharge date: 08/11/2011  Admission Diagnoses:  Principal Problem:  *Pain due to total left knee replacement   Discharge Diagnoses:  Same  Past Medical History  Diagnosis Date  . Paroxysmal atrial fibrillation   . HTN (hypertension)   . Hyperlipidemia   . Migraines   . Arthritis of knee, right     end-stage  . Bursitis   . Gout   . Tinnitus   . Depression   . Bronchitis   . Heart murmur   . Sleep apnea     wnl- sleep study, done 2004  . Anemia     since post op- knee replacement   . Complication of anesthesia     irritation in throat postop, makes a-fib worse   . DJD (degenerative joint disease)     OA- "everywhere"  . Anxiety     takes xanax 2 times per day, taken mostly for ringing in ears      Surgeries: Procedure(s): TOTAL KNEE REVISION on 08/08/2011   Consultants:    Discharged Condition: Improved  Hospital Course: LINARD DAFT is an 71 y.o. male who was admitted 08/08/2011 for operative treatment ofPain due to total left knee replacement. Patient has severe unremitting pain that affects sleep, daily activities, and work/hobbies. After pre-op clearance the patient was taken to the operating room on 08/08/2011 and underwent  Procedure(s): TOTAL KNEE REVISION.  We removed a loose femoral component #3, and revise to 2 and a #3 femoral component with a 14 x 1 10 stem. We also downsized the bearing for 12.5 mm to 10 mm. On the third postoperative day his pain had improved at least 50%.  Patient was given perioperative antibiotics: Anti-infectives     Start     Dose/Rate Route Frequency Ordered Stop   08/08/11 1254   cefUROXime (ZINACEF) injection  Status:  Discontinued          As needed 08/08/11 1254 08/08/11 1348   08/08/11 0600   ceFAZolin (ANCEF) IVPB 2 g/50 mL premix        2 g 100 mL/hr over 30 Minutes Intravenous 60 min pre-op 08/07/11 1008 08/08/11 1123             Patient was given sequential compression devices, early ambulation, and chemoprophylaxis to prevent DVT.  Patient benefited maximally from hospital stay and there were no complications.    Recent vital signs: Patient Vitals for the past 24 hrs:  BP Temp Temp src Pulse Resp SpO2  08/11/11 0626 109/75 mmHg 97.7 F (36.5 C) - 70  18  95 %  Aug 12, 2011 2239 121/65 mmHg 98.6 F (37 C) - 86  20  98 %  08/12/11 2206 121/65 mmHg - - 85  - -  08-12-2011 1430 145/80 mmHg 98.7 F (37.1 C) Oral 65  18  100 %     Recent laboratory studies:  Basename 08/11/11 0515 08/12/11 0513 08/09/11 0646  WBC 6.1 6.0 --  HGB 9.8* 9.4* --  HCT 28.6* 28.2* --  PLT 151 126* --  NA -- -- 136  K -- -- 4.5  CL -- -- 102  CO2 -- -- 26  BUN -- -- 16  CREATININE -- -- 0.96  GLUCOSE -- -- 105*  INR 1.74* 1.88* --  CALCIUM -- -- 8.1*     Discharge Medications:   Medication List  As of 08/11/2011  7:16 AM   ASK  your doctor about these medications         ALIGN 4 MG Caps   Take 1 capsule by mouth daily.      ALPRAZolam 0.5 MG tablet   Commonly known as: XANAX   Take 0.5 mg by mouth 2 (two) times daily.      dronedarone 400 MG tablet   Commonly known as: MULTAQ   Take 400 mg by mouth 2 (two) times daily with a meal.      ferrous sulfate 325 (65 FE) MG tablet   Take 325 mg by mouth daily with breakfast.      FLUoxetine 40 MG capsule   Commonly known as: PROZAC   Take 40 mg by mouth every morning.      ibuprofen 200 MG tablet   Commonly known as: ADVIL,MOTRIN   Take 200 mg by mouth daily as needed. Knee pain      potassium chloride SA 20 MEQ tablet   Commonly known as: K-DUR,KLOR-CON   Take 1 tablet (20 mEq total) by mouth daily.      propranolol 20 MG tablet   Commonly known as: INDERAL   Take 20 mg by mouth 3 (three) times daily.      warfarin 5 MG tablet   Commonly known as: COUMADIN   Take 2.5-5 mg by mouth daily. Takes 5 mg on tuesdays, Thursdays, & Saturdays  Takes 2.5 mg  the rest of the week            Diagnostic Studies: Dg Chest 2 View  08/04/2011  *RADIOLOGY REPORT*  Clinical Data: Preop radiograph  CHEST - 2 VIEW  Comparison: 05/10/2010  Findings: There is a left chest wall pacer device with lead in the right atrial appendage and right ventricle.  The heart size appears normal.  There is no pleural effusion or pulmonary edema.  Asymmetric elevation of the right hemidiaphragm is similar to previous exam, likely chronic.  No airspace consolidation.  IMPRESSION:  1.  No acute cardiopulmonary abnormalities.  Original Report Authenticated By: Rosealee Albee, M.D.   Dg Knee Left Port  08/08/2011  *RADIOLOGY REPORT*  Clinical Data: Postop revision  PORTABLE LEFT KNEE - 1-2 VIEW  Comparison: 06/08/2010  Findings: Two views of the left knee submitted.  There is a new left knee prosthesis in anatomic alignment.  Postsurgical changes are noted with midline skin staples.  Surgical drains are noted in the suprapatellar region. Small amount of periarticular soft tissue air.  IMPRESSION: New left knee prosthesis in anatomic alignment.  Postsurgical changes are noted peri  Original Report Authenticated By: Natasha Mead, M.D.    Disposition:  the patient will be discharged home and will have home health care for physical therapy and nursing care.  In addition to his preoperative medicines he will also have Percocet 5 mg 1-2 by mouth every 4 hours when necessary dispense 60 no refills, Coumadin 5 mg take as directed.  He'll return to my office in approximately 10 days for routine followup removal of staples and x-rays.  He'll return sooner if he has any fevers chills or markedly increasing pain.  Discharge Orders    Future Appointments: Provider: Department: Dept Phone: Center:   09/04/2011 8:10 AM [facility] [phone] [facility]         Signed: Nestor Lewandowsky 08/11/2011, 7:16 AM

## 2011-08-11 NOTE — Discharge Instructions (Signed)
Home Health to be provided by [facility] [phone] 

## 2011-08-11 NOTE — Progress Notes (Signed)
UR COMPLETED  

## 2011-08-11 NOTE — Progress Notes (Signed)
Physical Therapy Note   08/11/11 0900  PT Visit Information  Last PT Received On 08/11/11  Precautions  Precautions Knee  Restrictions  LLE Weight Bearing WBAT  Bed Mobility  Supine to Sit 6: Modified independent (Device/Increase time);HOB flat  Transfers  Sit to Stand 5: Supervision;From bed;With upper extremity assist  Sit to Stand Details (indicate cue type and reason) Better job with hand placement  Stand to Sit 5: Supervision;To bed;With upper extremity assist  Stand to Sit Details cues for hand placement  Ambulation/Gait  Ambulation/Gait Assistance 5: Supervision  Ambulation/Gait Assistance Details (indicate cue type and reason) Good, solid step-through pattern  Ambulation Distance (Feet) 220 Feet  Assistive device Rolling walker  Stairs Assistance 5: Supervision  Stairs Assistance Details (indicate cue type and reason) cues for feet proximityto steps before beginning and to avoid having just one arm on RW for risk for tipping  Stair Management Technique One rail Left;Sideways  Number of Stairs 2  (twice)  PT - End of Session  Equipment Utilized During Treatment Gait belt  Activity Tolerance Patient tolerated treatment well  Patient left in chair;with call bell in reach  General  Behavior During Session [facility] for tasks performed  [facility] for tasks performed  PT - Assessment/Plan  Comments on Treatment Session OK for dc home  PT Plan Discharge plan remains appropriate;Frequency remains appropriate  PT Frequency 7X/week  Follow Up Recommendations Home health PT  Equipment Recommended None recommended by PT  Acute Rehab PT Goals  Pt will go Supine/Side to Sit with modified independence  PT Goal: Supine/Side to Sit - Progress Met  Pt will go Sit to Stand with modified independence  PT Goal: Sit to Stand - Progress Progressing toward goal  Pt will go Stand to Sit with modified independence  PT Goal: Stand to Sit - Progress Progressing toward goal  Pt will Ambulate  >150 feet;with modified independence;with rolling walker  PT Goal: Ambulate - Progress Progressing toward goal  Pt will Go Up / Down Stairs 3-5 stairs;with supervision;with rail(s)  PT Goal: Up/Down Stairs - Progress Met    Van Clines, [place] [phone]

## 2011-08-11 NOTE — Discharge Summary (Signed)
Patient ID: [name] [name] MRN: [id number] DOB/AGE: 10-02-1940 71 y.o.  Admit date: 08/08/2011 Discharge date: 08/11/2011  Admission Diagnoses:  Principal Problem:  *Pain due to total left knee replacement   Discharge Diagnoses:  Same  Past Medical History  Diagnosis Date  . Paroxysmal atrial fibrillation   . HTN (hypertension)   . Hyperlipidemia   . Migraines   . Arthritis of knee, right     end-stage  . Bursitis   . Gout   . Tinnitus   . Depression   . Bronchitis   . Heart murmur   . Sleep apnea     wnl- sleep study, done 2004  . Anemia     since post op- knee replacement   . Complication of anesthesia     irritation in throat postop, makes a-fib worse   . DJD (degenerative joint disease)     OA- "everywhere"  . Anxiety     takes xanax 2 times per day, taken mostly for ringing in ears      Surgeries: Procedure(s): TOTAL KNEE REVISION on 08/08/2011   Consultants:    Discharged Condition: Improved  Hospital Course: [name] [name] is an 71 y.o. male who was admitted 08/08/2011 for operative treatment ofPain due to total left knee replacement. Patient has severe unremitting pain that affects sleep, daily activities, and work/hobbies. After pre-op clearance the patient was taken to the operating room on 08/08/2011 and underwent  Procedure(s): TOTAL KNEE REVISION.    Patient was given perioperative antibiotics: Anti-infectives     Start     Dose/Rate Route Frequency Ordered Stop   08/08/11 1254   cefUROXime (ZINACEF) injection  Status:  Discontinued          As needed 08/08/11 1254 08/08/11 1348   08/08/11 0600   ceFAZolin (ANCEF) IVPB 2 g/50 mL premix        2 g 100 mL/hr over 30 Minutes Intravenous 60 min pre-op 08/07/11 1008 08/08/11 1123           Patient was given sequential compression devices, early ambulation, and chemoprophylaxis to prevent DVT.  Patient benefited maximally from hospital stay and there were no complications.    Recent vital  signs: Patient Vitals for the past 24 hrs:  BP Temp Temp src Pulse Resp SpO2  08/11/11 0626 109/75 mmHg 97.7 F (36.5 C) - 70  18  95 %  11-Aug-2011 2239 121/65 mmHg 98.6 F (37 C) - 86  20  98 %  2011-08-11 2206 121/65 mmHg - - 85  - -  August 11, 2011 1430 145/80 mmHg 98.7 F (37.1 C) Oral 65  18  100 %     Recent laboratory studies:  Basename 08/11/11 0515 11-Aug-2011 0513 08/09/11 0646  WBC 6.1 6.0 --  HGB 9.8* 9.4* --  HCT 28.6* 28.2* --  PLT 151 126* --  NA -- -- 136  K -- -- 4.5  CL -- -- 102  CO2 -- -- 26  BUN -- -- 16  CREATININE -- -- 0.96  GLUCOSE -- -- 105*  INR 1.74* 1.88* --  CALCIUM -- -- 8.1*     Discharge Medications:   Medication List  As of 08/11/2011  8:01 AM   STOP taking these medications         ibuprofen 200 MG tablet         TAKE these medications         ALIGN 4 MG Caps   Take 1 capsule by mouth daily.  ALPRAZolam 0.5 MG tablet   Commonly known as: XANAX   Take 0.5 mg by mouth 2 (two) times daily.      dronedarone 400 MG tablet   Commonly known as: MULTAQ   Take 400 mg by mouth 2 (two) times daily with a meal.      ferrous sulfate 325 (65 FE) MG tablet   Take 325 mg by mouth daily with breakfast.      FLUoxetine 40 MG capsule   Commonly known as: PROZAC   Take 40 mg by mouth every morning.      methocarbamol 500 MG tablet   Commonly known as: ROBAXIN   Take 1 tablet (500 mg total) by mouth every 6 (six) hours as needed.      oxyCODONE-acetaminophen 5-325 MG per tablet   Commonly known as: PERCOCET   Take 1-2 tablets by mouth every 4 (four) hours as needed.      potassium chloride SA 20 MEQ tablet   Commonly known as: K-DUR,KLOR-CON   Take 1 tablet (20 mEq total) by mouth daily.      propranolol 20 MG tablet   Commonly known as: INDERAL   Take 20 mg by mouth 3 (three) times daily.      warfarin 5 MG tablet   Commonly known as: COUMADIN   Take 2.5-5 mg by mouth daily. Takes 5 mg on tuesdays, Thursdays, & Saturdays  Takes 2.5 mg  the rest of the week            Diagnostic Studies: Dg Chest 2 View  08/04/2011  *RADIOLOGY REPORT*  Clinical Data: Preop radiograph  CHEST - 2 VIEW  Comparison: 05/10/2010  Findings: There is a left chest wall pacer device with lead in the right atrial appendage and right ventricle.  The heart size appears normal.  There is no pleural effusion or pulmonary edema.  Asymmetric elevation of the right hemidiaphragm is similar to previous exam, likely chronic.  No airspace consolidation.  IMPRESSION:  1.  No acute cardiopulmonary abnormalities.  Original Report Authenticated By: Rosealee Albee, M.D.   Dg Knee Left Port  08/08/2011  *RADIOLOGY REPORT*  Clinical Data: Postop revision  PORTABLE LEFT KNEE - 1-2 VIEW  Comparison: 06/08/2010  Findings: Two views of the left knee submitted.  There is a new left knee prosthesis in anatomic alignment.  Postsurgical changes are noted with midline skin staples.  Surgical drains are noted in the suprapatellar region. Small amount of periarticular soft tissue air.  IMPRESSION: New left knee prosthesis in anatomic alignment.  Postsurgical changes are noted peri  Original Report Authenticated By: Natasha Mead, M.D.    Disposition:   Discharge Orders    Future Appointments: Provider: Department: Dept Phone: Center:   09/04/2011 8:10 AM [facility] [phone] [facility]     Future Orders Please Complete By Expires   Increase activity slowly      Walker       May shower / Bathe      Driving Restrictions      Comments:   No driving for 2 weeks.   Change dressing (specify)      Comments:   Dressing change as needed.   Call MD for:  temperature >100.4      Call MD for:  severe uncontrolled pain      Call MD for:  redness, tenderness, or signs of infection (pain, swelling, redness, odor or green/yellow discharge around incision site)  Discharge instructions      Comments:   F/U with Dr. Turner Daniels in 10 days          Signed: Hazle Nordmann. 08/11/2011, 8:01 AM

## 2011-08-12 ENCOUNTER — Encounter (HOSPITAL_COMMUNITY): Payer: Self-pay | Admitting: Orthopedic Surgery

## 2011-08-12 DIAGNOSIS — M1610 Unilateral primary osteoarthritis, unspecified hip: Secondary | ICD-10-CM | POA: Diagnosis not present

## 2011-08-12 DIAGNOSIS — I4891 Unspecified atrial fibrillation: Secondary | ICD-10-CM | POA: Diagnosis not present

## 2011-08-12 DIAGNOSIS — Z471 Aftercare following joint replacement surgery: Secondary | ICD-10-CM | POA: Diagnosis not present

## 2011-08-12 DIAGNOSIS — Z96659 Presence of unspecified artificial knee joint: Secondary | ICD-10-CM | POA: Diagnosis not present

## 2011-08-12 DIAGNOSIS — I10 Essential (primary) hypertension: Secondary | ICD-10-CM | POA: Diagnosis not present

## 2011-08-12 LAB — TISSUE CULTURE
Culture: NO GROWTH
Gram Stain: NONE SEEN

## 2011-08-13 DIAGNOSIS — M1610 Unilateral primary osteoarthritis, unspecified hip: Secondary | ICD-10-CM | POA: Diagnosis not present

## 2011-08-13 DIAGNOSIS — Z96659 Presence of unspecified artificial knee joint: Secondary | ICD-10-CM | POA: Diagnosis not present

## 2011-08-13 DIAGNOSIS — I10 Essential (primary) hypertension: Secondary | ICD-10-CM | POA: Diagnosis not present

## 2011-08-13 DIAGNOSIS — I4891 Unspecified atrial fibrillation: Secondary | ICD-10-CM | POA: Diagnosis not present

## 2011-08-13 DIAGNOSIS — Z471 Aftercare following joint replacement surgery: Secondary | ICD-10-CM | POA: Diagnosis not present

## 2011-08-13 LAB — ANAEROBIC CULTURE

## 2011-08-14 DIAGNOSIS — I4891 Unspecified atrial fibrillation: Secondary | ICD-10-CM | POA: Diagnosis not present

## 2011-08-14 DIAGNOSIS — I10 Essential (primary) hypertension: Secondary | ICD-10-CM | POA: Diagnosis not present

## 2011-08-14 DIAGNOSIS — Z471 Aftercare following joint replacement surgery: Secondary | ICD-10-CM | POA: Diagnosis not present

## 2011-08-14 DIAGNOSIS — Z96659 Presence of unspecified artificial knee joint: Secondary | ICD-10-CM | POA: Diagnosis not present

## 2011-08-14 DIAGNOSIS — M1610 Unilateral primary osteoarthritis, unspecified hip: Secondary | ICD-10-CM | POA: Diagnosis not present

## 2011-08-15 DIAGNOSIS — I4891 Unspecified atrial fibrillation: Secondary | ICD-10-CM | POA: Diagnosis not present

## 2011-08-15 DIAGNOSIS — I10 Essential (primary) hypertension: Secondary | ICD-10-CM | POA: Diagnosis not present

## 2011-08-15 DIAGNOSIS — Z471 Aftercare following joint replacement surgery: Secondary | ICD-10-CM | POA: Diagnosis not present

## 2011-08-15 DIAGNOSIS — Z96659 Presence of unspecified artificial knee joint: Secondary | ICD-10-CM | POA: Diagnosis not present

## 2011-08-15 DIAGNOSIS — M1610 Unilateral primary osteoarthritis, unspecified hip: Secondary | ICD-10-CM | POA: Diagnosis not present

## 2011-08-18 DIAGNOSIS — M1610 Unilateral primary osteoarthritis, unspecified hip: Secondary | ICD-10-CM | POA: Diagnosis not present

## 2011-08-18 DIAGNOSIS — Z471 Aftercare following joint replacement surgery: Secondary | ICD-10-CM | POA: Diagnosis not present

## 2011-08-18 DIAGNOSIS — I10 Essential (primary) hypertension: Secondary | ICD-10-CM | POA: Diagnosis not present

## 2011-08-18 DIAGNOSIS — I4891 Unspecified atrial fibrillation: Secondary | ICD-10-CM | POA: Diagnosis not present

## 2011-08-18 DIAGNOSIS — Z96659 Presence of unspecified artificial knee joint: Secondary | ICD-10-CM | POA: Diagnosis not present

## 2011-08-19 DIAGNOSIS — I10 Essential (primary) hypertension: Secondary | ICD-10-CM | POA: Diagnosis not present

## 2011-08-19 DIAGNOSIS — Z471 Aftercare following joint replacement surgery: Secondary | ICD-10-CM | POA: Diagnosis not present

## 2011-08-19 DIAGNOSIS — M1610 Unilateral primary osteoarthritis, unspecified hip: Secondary | ICD-10-CM | POA: Diagnosis not present

## 2011-08-19 DIAGNOSIS — I4891 Unspecified atrial fibrillation: Secondary | ICD-10-CM | POA: Diagnosis not present

## 2011-08-19 DIAGNOSIS — Z96659 Presence of unspecified artificial knee joint: Secondary | ICD-10-CM | POA: Diagnosis not present

## 2011-08-20 DIAGNOSIS — Z96659 Presence of unspecified artificial knee joint: Secondary | ICD-10-CM | POA: Diagnosis not present

## 2011-08-20 DIAGNOSIS — Z471 Aftercare following joint replacement surgery: Secondary | ICD-10-CM | POA: Diagnosis not present

## 2011-08-20 DIAGNOSIS — M1610 Unilateral primary osteoarthritis, unspecified hip: Secondary | ICD-10-CM | POA: Diagnosis not present

## 2011-08-20 DIAGNOSIS — I4891 Unspecified atrial fibrillation: Secondary | ICD-10-CM | POA: Diagnosis not present

## 2011-08-20 DIAGNOSIS — I10 Essential (primary) hypertension: Secondary | ICD-10-CM | POA: Diagnosis not present

## 2011-08-21 DIAGNOSIS — Z96659 Presence of unspecified artificial knee joint: Secondary | ICD-10-CM | POA: Diagnosis not present

## 2011-08-21 DIAGNOSIS — I4891 Unspecified atrial fibrillation: Secondary | ICD-10-CM | POA: Diagnosis not present

## 2011-08-21 DIAGNOSIS — M25569 Pain in unspecified knee: Secondary | ICD-10-CM | POA: Diagnosis not present

## 2011-08-21 DIAGNOSIS — M1610 Unilateral primary osteoarthritis, unspecified hip: Secondary | ICD-10-CM | POA: Diagnosis not present

## 2011-08-21 DIAGNOSIS — I10 Essential (primary) hypertension: Secondary | ICD-10-CM | POA: Diagnosis not present

## 2011-08-21 DIAGNOSIS — Z471 Aftercare following joint replacement surgery: Secondary | ICD-10-CM | POA: Diagnosis not present

## 2011-08-22 DIAGNOSIS — I10 Essential (primary) hypertension: Secondary | ICD-10-CM | POA: Diagnosis not present

## 2011-08-22 DIAGNOSIS — Z96659 Presence of unspecified artificial knee joint: Secondary | ICD-10-CM | POA: Diagnosis not present

## 2011-08-22 DIAGNOSIS — I4891 Unspecified atrial fibrillation: Secondary | ICD-10-CM | POA: Diagnosis not present

## 2011-08-22 DIAGNOSIS — Z471 Aftercare following joint replacement surgery: Secondary | ICD-10-CM | POA: Diagnosis not present

## 2011-08-22 DIAGNOSIS — M1610 Unilateral primary osteoarthritis, unspecified hip: Secondary | ICD-10-CM | POA: Diagnosis not present

## 2011-08-25 DIAGNOSIS — M1610 Unilateral primary osteoarthritis, unspecified hip: Secondary | ICD-10-CM | POA: Diagnosis not present

## 2011-08-25 DIAGNOSIS — Z96659 Presence of unspecified artificial knee joint: Secondary | ICD-10-CM | POA: Diagnosis not present

## 2011-08-25 DIAGNOSIS — I4891 Unspecified atrial fibrillation: Secondary | ICD-10-CM | POA: Diagnosis not present

## 2011-08-25 DIAGNOSIS — Z471 Aftercare following joint replacement surgery: Secondary | ICD-10-CM | POA: Diagnosis not present

## 2011-08-25 DIAGNOSIS — I10 Essential (primary) hypertension: Secondary | ICD-10-CM | POA: Diagnosis not present

## 2011-08-27 DIAGNOSIS — M1610 Unilateral primary osteoarthritis, unspecified hip: Secondary | ICD-10-CM | POA: Diagnosis not present

## 2011-08-27 DIAGNOSIS — I4891 Unspecified atrial fibrillation: Secondary | ICD-10-CM | POA: Diagnosis not present

## 2011-08-27 DIAGNOSIS — I10 Essential (primary) hypertension: Secondary | ICD-10-CM | POA: Diagnosis not present

## 2011-08-27 DIAGNOSIS — Z471 Aftercare following joint replacement surgery: Secondary | ICD-10-CM | POA: Diagnosis not present

## 2011-08-27 DIAGNOSIS — Z96659 Presence of unspecified artificial knee joint: Secondary | ICD-10-CM | POA: Diagnosis not present

## 2011-08-29 ENCOUNTER — Ambulatory Visit (INDEPENDENT_AMBULATORY_CARE_PROVIDER_SITE_OTHER): Payer: Medicare Other

## 2011-08-29 DIAGNOSIS — I4891 Unspecified atrial fibrillation: Secondary | ICD-10-CM | POA: Diagnosis not present

## 2011-08-29 DIAGNOSIS — I10 Essential (primary) hypertension: Secondary | ICD-10-CM | POA: Diagnosis not present

## 2011-08-29 DIAGNOSIS — Z96659 Presence of unspecified artificial knee joint: Secondary | ICD-10-CM | POA: Diagnosis not present

## 2011-08-29 DIAGNOSIS — Z7901 Long term (current) use of anticoagulants: Secondary | ICD-10-CM | POA: Diagnosis not present

## 2011-08-29 DIAGNOSIS — M1610 Unilateral primary osteoarthritis, unspecified hip: Secondary | ICD-10-CM | POA: Diagnosis not present

## 2011-08-29 DIAGNOSIS — Z471 Aftercare following joint replacement surgery: Secondary | ICD-10-CM | POA: Diagnosis not present

## 2011-09-01 DIAGNOSIS — I4891 Unspecified atrial fibrillation: Secondary | ICD-10-CM | POA: Diagnosis not present

## 2011-09-01 DIAGNOSIS — I10 Essential (primary) hypertension: Secondary | ICD-10-CM | POA: Diagnosis not present

## 2011-09-01 DIAGNOSIS — M1610 Unilateral primary osteoarthritis, unspecified hip: Secondary | ICD-10-CM | POA: Diagnosis not present

## 2011-09-01 DIAGNOSIS — Z96659 Presence of unspecified artificial knee joint: Secondary | ICD-10-CM | POA: Diagnosis not present

## 2011-09-01 DIAGNOSIS — Z471 Aftercare following joint replacement surgery: Secondary | ICD-10-CM | POA: Diagnosis not present

## 2011-09-03 DIAGNOSIS — M1610 Unilateral primary osteoarthritis, unspecified hip: Secondary | ICD-10-CM | POA: Diagnosis not present

## 2011-09-03 DIAGNOSIS — I10 Essential (primary) hypertension: Secondary | ICD-10-CM | POA: Diagnosis not present

## 2011-09-03 DIAGNOSIS — I4891 Unspecified atrial fibrillation: Secondary | ICD-10-CM | POA: Diagnosis not present

## 2011-09-03 DIAGNOSIS — F429 Obsessive-compulsive disorder, unspecified: Secondary | ICD-10-CM | POA: Diagnosis not present

## 2011-09-03 DIAGNOSIS — Z471 Aftercare following joint replacement surgery: Secondary | ICD-10-CM | POA: Diagnosis not present

## 2011-09-03 DIAGNOSIS — Z96659 Presence of unspecified artificial knee joint: Secondary | ICD-10-CM | POA: Diagnosis not present

## 2011-09-04 ENCOUNTER — Encounter: Payer: Medicare Other | Admitting: *Deleted

## 2011-09-05 DIAGNOSIS — I4891 Unspecified atrial fibrillation: Secondary | ICD-10-CM | POA: Diagnosis not present

## 2011-09-05 DIAGNOSIS — I10 Essential (primary) hypertension: Secondary | ICD-10-CM | POA: Diagnosis not present

## 2011-09-05 DIAGNOSIS — Z471 Aftercare following joint replacement surgery: Secondary | ICD-10-CM | POA: Diagnosis not present

## 2011-09-05 DIAGNOSIS — Z96659 Presence of unspecified artificial knee joint: Secondary | ICD-10-CM | POA: Diagnosis not present

## 2011-09-05 DIAGNOSIS — M1610 Unilateral primary osteoarthritis, unspecified hip: Secondary | ICD-10-CM | POA: Diagnosis not present

## 2011-09-08 DIAGNOSIS — M1610 Unilateral primary osteoarthritis, unspecified hip: Secondary | ICD-10-CM | POA: Diagnosis not present

## 2011-09-08 DIAGNOSIS — I4891 Unspecified atrial fibrillation: Secondary | ICD-10-CM | POA: Diagnosis not present

## 2011-09-08 DIAGNOSIS — I10 Essential (primary) hypertension: Secondary | ICD-10-CM | POA: Diagnosis not present

## 2011-09-08 DIAGNOSIS — Z471 Aftercare following joint replacement surgery: Secondary | ICD-10-CM | POA: Diagnosis not present

## 2011-09-08 DIAGNOSIS — Z96659 Presence of unspecified artificial knee joint: Secondary | ICD-10-CM | POA: Diagnosis not present

## 2011-09-09 ENCOUNTER — Encounter: Payer: Self-pay | Admitting: *Deleted

## 2011-09-10 DIAGNOSIS — Z471 Aftercare following joint replacement surgery: Secondary | ICD-10-CM | POA: Diagnosis not present

## 2011-09-10 DIAGNOSIS — I10 Essential (primary) hypertension: Secondary | ICD-10-CM | POA: Diagnosis not present

## 2011-09-10 DIAGNOSIS — I4891 Unspecified atrial fibrillation: Secondary | ICD-10-CM | POA: Diagnosis not present

## 2011-09-10 DIAGNOSIS — M1610 Unilateral primary osteoarthritis, unspecified hip: Secondary | ICD-10-CM | POA: Diagnosis not present

## 2011-09-10 DIAGNOSIS — Z96659 Presence of unspecified artificial knee joint: Secondary | ICD-10-CM | POA: Diagnosis not present

## 2011-09-12 ENCOUNTER — Ambulatory Visit (INDEPENDENT_AMBULATORY_CARE_PROVIDER_SITE_OTHER): Payer: Medicare Other

## 2011-09-12 DIAGNOSIS — I10 Essential (primary) hypertension: Secondary | ICD-10-CM | POA: Diagnosis not present

## 2011-09-12 DIAGNOSIS — M1610 Unilateral primary osteoarthritis, unspecified hip: Secondary | ICD-10-CM | POA: Diagnosis not present

## 2011-09-12 DIAGNOSIS — Z7901 Long term (current) use of anticoagulants: Secondary | ICD-10-CM | POA: Diagnosis not present

## 2011-09-12 DIAGNOSIS — Z471 Aftercare following joint replacement surgery: Secondary | ICD-10-CM | POA: Diagnosis not present

## 2011-09-12 DIAGNOSIS — I4891 Unspecified atrial fibrillation: Secondary | ICD-10-CM

## 2011-09-12 DIAGNOSIS — Z96659 Presence of unspecified artificial knee joint: Secondary | ICD-10-CM | POA: Diagnosis not present

## 2011-09-12 LAB — POCT INR: INR: 1.7

## 2011-09-15 DIAGNOSIS — M25569 Pain in unspecified knee: Secondary | ICD-10-CM | POA: Diagnosis not present

## 2011-09-15 DIAGNOSIS — Z96659 Presence of unspecified artificial knee joint: Secondary | ICD-10-CM | POA: Diagnosis not present

## 2011-09-18 DIAGNOSIS — M25569 Pain in unspecified knee: Secondary | ICD-10-CM | POA: Diagnosis not present

## 2011-09-18 DIAGNOSIS — Z96659 Presence of unspecified artificial knee joint: Secondary | ICD-10-CM | POA: Diagnosis not present

## 2011-09-22 DIAGNOSIS — Z96659 Presence of unspecified artificial knee joint: Secondary | ICD-10-CM | POA: Diagnosis not present

## 2011-09-22 DIAGNOSIS — M25569 Pain in unspecified knee: Secondary | ICD-10-CM | POA: Diagnosis not present

## 2011-09-24 ENCOUNTER — Ambulatory Visit (INDEPENDENT_AMBULATORY_CARE_PROVIDER_SITE_OTHER): Payer: Medicare Other | Admitting: *Deleted

## 2011-09-24 ENCOUNTER — Encounter: Payer: Self-pay | Admitting: Internal Medicine

## 2011-09-24 DIAGNOSIS — I4891 Unspecified atrial fibrillation: Secondary | ICD-10-CM | POA: Diagnosis not present

## 2011-09-24 DIAGNOSIS — I495 Sick sinus syndrome: Secondary | ICD-10-CM

## 2011-09-26 ENCOUNTER — Ambulatory Visit (INDEPENDENT_AMBULATORY_CARE_PROVIDER_SITE_OTHER): Payer: Medicare Other | Admitting: *Deleted

## 2011-09-26 DIAGNOSIS — Z96659 Presence of unspecified artificial knee joint: Secondary | ICD-10-CM | POA: Diagnosis not present

## 2011-09-26 DIAGNOSIS — I4891 Unspecified atrial fibrillation: Secondary | ICD-10-CM

## 2011-09-26 DIAGNOSIS — Z7901 Long term (current) use of anticoagulants: Secondary | ICD-10-CM | POA: Diagnosis not present

## 2011-09-26 DIAGNOSIS — M25569 Pain in unspecified knee: Secondary | ICD-10-CM | POA: Diagnosis not present

## 2011-09-29 DIAGNOSIS — M25569 Pain in unspecified knee: Secondary | ICD-10-CM | POA: Diagnosis not present

## 2011-09-29 DIAGNOSIS — Z96659 Presence of unspecified artificial knee joint: Secondary | ICD-10-CM | POA: Diagnosis not present

## 2011-09-29 LAB — REMOTE PACEMAKER DEVICE
ATRIAL PACING PM: 62.31
VENTRICULAR PACING PM: 22.94

## 2011-09-30 NOTE — Progress Notes (Signed)
Remote pacer check  

## 2011-10-02 DIAGNOSIS — M25569 Pain in unspecified knee: Secondary | ICD-10-CM | POA: Diagnosis not present

## 2011-10-02 DIAGNOSIS — Z96659 Presence of unspecified artificial knee joint: Secondary | ICD-10-CM | POA: Diagnosis not present

## 2011-10-06 DIAGNOSIS — M25569 Pain in unspecified knee: Secondary | ICD-10-CM | POA: Diagnosis not present

## 2011-10-06 DIAGNOSIS — Z96659 Presence of unspecified artificial knee joint: Secondary | ICD-10-CM | POA: Diagnosis not present

## 2011-10-07 ENCOUNTER — Encounter (HOSPITAL_COMMUNITY): Payer: Self-pay | Admitting: *Deleted

## 2011-10-07 ENCOUNTER — Emergency Department (HOSPITAL_COMMUNITY)
Admission: EM | Admit: 2011-10-07 | Discharge: 2011-10-08 | Disposition: A | Discharge status: Home / Self Care | Payer: Medicare Other | Attending: Emergency Medicine | Admitting: Emergency Medicine

## 2011-10-07 ENCOUNTER — Emergency Department (HOSPITAL_COMMUNITY): Payer: Medicare Other

## 2011-10-07 DIAGNOSIS — R51 Headache: Secondary | ICD-10-CM | POA: Insufficient documentation

## 2011-10-07 DIAGNOSIS — Z862 Personal history of diseases of the blood and blood-forming organs and certain disorders involving the immune mechanism: Secondary | ICD-10-CM | POA: Insufficient documentation

## 2011-10-07 DIAGNOSIS — R5381 Other malaise: Secondary | ICD-10-CM | POA: Insufficient documentation

## 2011-10-07 DIAGNOSIS — Z7901 Long term (current) use of anticoagulants: Secondary | ICD-10-CM | POA: Insufficient documentation

## 2011-10-07 DIAGNOSIS — Z79899 Other long term (current) drug therapy: Secondary | ICD-10-CM | POA: Insufficient documentation

## 2011-10-07 DIAGNOSIS — Z8639 Personal history of other endocrine, nutritional and metabolic disease: Secondary | ICD-10-CM | POA: Insufficient documentation

## 2011-10-07 DIAGNOSIS — E785 Hyperlipidemia, unspecified: Secondary | ICD-10-CM | POA: Diagnosis not present

## 2011-10-07 DIAGNOSIS — F341 Dysthymic disorder: Secondary | ICD-10-CM | POA: Diagnosis not present

## 2011-10-07 DIAGNOSIS — R61 Generalized hyperhidrosis: Secondary | ICD-10-CM | POA: Diagnosis not present

## 2011-10-07 DIAGNOSIS — R5383 Other fatigue: Secondary | ICD-10-CM | POA: Insufficient documentation

## 2011-10-07 DIAGNOSIS — I4891 Unspecified atrial fibrillation: Secondary | ICD-10-CM | POA: Insufficient documentation

## 2011-10-07 DIAGNOSIS — I10 Essential (primary) hypertension: Secondary | ICD-10-CM | POA: Insufficient documentation

## 2011-10-07 DIAGNOSIS — D689 Coagulation defect, unspecified: Secondary | ICD-10-CM | POA: Diagnosis not present

## 2011-10-07 DIAGNOSIS — R42 Dizziness and giddiness: Secondary | ICD-10-CM | POA: Diagnosis not present

## 2011-10-07 DIAGNOSIS — J984 Other disorders of lung: Secondary | ICD-10-CM | POA: Diagnosis not present

## 2011-10-07 DIAGNOSIS — D6832 Hemorrhagic disorder due to extrinsic circulating anticoagulants: Secondary | ICD-10-CM

## 2011-10-07 LAB — PROTIME-INR
INR: 5.31 (ref 0.00–1.49)
Prothrombin Time: 49.4 seconds — ABNORMAL HIGH (ref 11.6–15.2)

## 2011-10-07 LAB — GLUCOSE, CAPILLARY: Glucose-Capillary: 88 mg/dL (ref 70–99)

## 2011-10-07 LAB — DIFFERENTIAL
Eosinophils Absolute: 0.5 10*3/uL (ref 0.0–0.7)
Lymphocytes Relative: 36 % (ref 12–46)
Lymphs Abs: 2.4 10*3/uL (ref 0.7–4.0)
Neutrophils Relative %: 48 % (ref 43–77)

## 2011-10-07 LAB — POCT I-STAT, CHEM 8
BUN: 14 mg/dL (ref 6–23)
Chloride: 107 mEq/L (ref 96–112)
Creatinine, Ser: 0.9 mg/dL (ref 0.50–1.35)
Glucose, Bld: 87 mg/dL (ref 70–99)
HCT: 38 % — ABNORMAL LOW (ref 39.0–52.0)
Potassium: 4.3 mEq/L (ref 3.5–5.1)

## 2011-10-07 LAB — CBC
Platelets: 188 10*3/uL (ref 150–400)
RBC: 4.45 MIL/uL (ref 4.22–5.81)
WBC: 6.7 10*3/uL (ref 4.0–10.5)

## 2011-10-07 NOTE — ED Notes (Signed)
Pt ambulated to bathroom with standby assistance.

## 2011-10-07 NOTE — Consult Note (Addendum)
Cardiology IP Consult  Reason for Consult:afib Referring Physician: Hyman Hopes Cardiologist: Rosette Reveal  HPI: [name] [name] is a 71 y.o.male with paroxysmal AF who presents to the ED with episodes of dizziness and headache x 2 days.  His headache has been almost constant and his dizziness has been only mild.  He otherwise feels ok.  He was found to be in afib on the monitor and I was consulted.  He denies chest pain, palpitations, SOB, orthopnea, or PND.  He recently underwent left knee surgery and has been going to rehab reguarly.  He did take a percocet just prior to his headache starting but has not taken another one since then.  He has also noted some mild swelling in his knee. He has no fevers or chills.  His device was interrogated and revealed AF burden of 39% which is similar to what was seen in January.  He is concerned his symptoms might be related to AF.  He would be interested in another ablation as suggested by Dr. Ladona Ridgel.in Jan.  His INR has been supratherapeutic for about the last week and he has intermittently held his coumadin.   Past Medical History  Diagnosis Date  . Paroxysmal atrial fibrillation   . HTN (hypertension)   . Hyperlipidemia   . Migraines   . Arthritis of knee, right     end-stage  . Bursitis   . Gout   . Tinnitus   . Depression   . Bronchitis   . Heart murmur   . Sleep apnea     wnl- sleep study, done 2004  . Anemia     since post op- knee replacement   . Complication of anesthesia     irritation in throat postop, makes a-fib worse   . DJD (degenerative joint disease)     OA- "everywhere"  . Anxiety     takes xanax 2 times per day, taken mostly for ringing in ears      Past Surgical History  Procedure Date  . Transesophageal echocardiogram 04/2008  . Transthoracic echocardiogram 2006,2009  . Bilteral thumb joint surgrery   . Right rotator cuff surgery 2006  . Cholecystectomy   . Rhinoplasty   . Uvulectomy   . Appendectomy   . Pacemaker insertion  06/06/2005    Medtronic EnRhythm dual-chamber. For sick sinus syndrome  . Right total hip arthroplasty   . Left rotator cuff repair   . Insert / replace / remove pacemaker     Medtronic   . Cardiac catheterization 2005     which revealed 40% stenosis of the mid left anterior descending artery., ablation- 2009  . Tonsillectomy     as a child  . Joint replacement     L knee- 12/11,R hip- 1996  . Total knee revision 08/08/2011    Procedure: TOTAL KNEE REVISION;  Surgeon: Nestor Lewandowsky, MD;  Location: [facility] OR;  Service: Orthopedics;  Laterality: Left;  DEPUY/SIGMA    Family History  Problem Relation Age of Onset  . Coronary artery disease Mother   . Other Mother     Cerebrovascular Disease  . Cancer    . Anesthesia problems Neg Hx   . Hypotension Neg Hx   . Malignant hyperthermia Neg Hx   . Pseudochol deficiency Neg Hx     Social History:  reports that he has never smoked. He does not have any smokeless tobacco history on file. He reports that he drinks alcohol. He reports that he does not use illicit  drugs.  Allergies:  Allergies  Allergen Reactions  . Iohexol      Code: HIVES, Desc: POST MYELOGRAM.  PT ALSO SAID THIS HAPPENED A YEAR AGO WITH A MYELOGRAM AND WITH ESI'S., Onset Date: [id number]   . Meperidine Hcl     REACTION: unspecified  . Prednisone     Redness of skin & jittery    No current facility-administered medications for this encounter.   Current Outpatient Prescriptions  Medication Sig Dispense Refill  . ALPRAZolam (XANAX) 0.5 MG tablet Take 0.5 mg by mouth 2 (two) times daily.       Marland Kitchen dronedarone (MULTAQ) 400 MG tablet Take 400 mg by mouth 2 (two) times daily with a meal.      . ferrous sulfate 325 (65 FE) MG tablet Take 325 mg by mouth daily with breakfast.       . FLUoxetine (PROZAC) 40 MG capsule Take 40 mg by mouth daily.       . naproxen sodium (ANAPROX) 220 MG tablet Take 220 mg by mouth 2 (two) times daily.      . potassium chloride SA (K-DUR,KLOR-CON) 20  MEQ tablet Take 1 tablet (20 mEq total) by mouth daily.  30 tablet  6  . Probiotic Product (ALIGN) 4 MG CAPS Take 1 capsule by mouth daily.        . propranolol (INDERAL) 20 MG tablet Take 20 mg by mouth 3 (three) times daily.      Marland Kitchen warfarin (COUMADIN) 5 MG tablet Take 2.5-5 mg by mouth daily. Takes 5 mg on tuesdays, Thursdays, & Saturdays Takes 2.5 mg the rest of the week        ROS: A full review of systems is obtained and is negative except as noted in the HPI.  Physical Exam: Blood pressure 128/77, pulse 84, temperature 97.2 F (36.2 C), temperature source Oral, resp. rate 18, SpO2 97.00%.  GENERAL: no acute distress.  EYES: Extra ocular movements are intact. There is no lid lag. Sclera is anicteric.  ENT: Oropharynx is clear. Dentition is within normal limits.  NECK: Supple. The thyroid is not enlarged.  LYMPH: There are no masses or lymphadenopathy present.  HEART: irregular with no m/g/r.  Normal S1/S2. No JVD LUNGS: Clear to auscultation There are no rales, rhonchi, or wheezes.  ABDOMEN: Soft, non-tender, and non-distended with normoactive bowel sounds. There is no hepatosplenomegaly.  EXTREMITIES: No clubbing, cyanosis, or edema. Mild effusion on left knee PULSES: Carotids were +2 and equal bilaterally with no bruits. Femoral pulses were +2 and equal bilaterally. DP/PT pulses were +2 and equal bilaterally.  SKIN: Warm, dry, and intact.  NEUROLOGIC: The patient was oriented to person, place, and time. No overt neurologic deficits were detected.  PSYCH: Normal judgment and insight, mood is appropriate.    Results: Results for orders placed during the hospital encounter of 10/07/11 (from the past 24 hour(s))  GLUCOSE, CAPILLARY     Status: Normal   Collection Time   10/07/11  7:44 PM      Component Value Range   Glucose-Capillary 88  70 - 99 (mg/dL)  PROTIME-INR     Status: Abnormal   Collection Time   10/07/11  9:14 PM      Component Value Range   Prothrombin Time 49.4 (*)  11.6 - 15.2 (seconds)   INR 5.31 (*) 0.00 - 1.49   CBC     Status: Abnormal   Collection Time   10/07/11  9:14 PM  Component Value Range   WBC 6.7  4.0 - 10.5 (K/uL)   RBC 4.45  4.22 - 5.81 (MIL/uL)   Hemoglobin 12.9 (*) 13.0 - 17.0 (g/dL)   HCT 16.1 (*) 09.6 - 52.0 (%)   MCV 85.4  78.0 - 100.0 (fL)   MCH 29.0  26.0 - 34.0 (pg)   MCHC 33.9  30.0 - 36.0 (g/dL)   RDW 04.5  40.9 - 81.1 (%)   Platelets 188  150 - 400 (K/uL)  DIFFERENTIAL     Status: Abnormal   Collection Time   10/07/11  9:14 PM      Component Value Range   Neutrophils Relative 48  43 - 77 (%)   Neutro Abs 3.2  1.7 - 7.7 (K/uL)   Lymphocytes Relative 36  12 - 46 (%)   Lymphs Abs 2.4  0.7 - 4.0 (K/uL)   Monocytes Relative 8  3 - 12 (%)   Monocytes Absolute 0.5  0.1 - 1.0 (K/uL)   Eosinophils Relative 7 (*) 0 - 5 (%)   Eosinophils Absolute 0.5  0.0 - 0.7 (K/uL)   Basophils Relative 1  0 - 1 (%)   Basophils Absolute 0.1  0.0 - 0.1 (K/uL)  POCT I-STAT TROPONIN I     Status: Normal   Collection Time   10/07/11  9:38 PM      Component Value Range   Troponin i, poc 0.01  0.00 - 0.08 (ng/mL)   Comment 3           POCT I-STAT, CHEM 8     Status: Abnormal   Collection Time   10/07/11  9:40 PM      Component Value Range   Sodium 142  135 - 145 (mEq/L)   Potassium 4.3  3.5 - 5.1 (mEq/L)   Chloride 107  96 - 112 (mEq/L)   BUN 14  6 - 23 (mg/dL)   Creatinine, Ser 9.14  0.50 - 1.35 (mg/dL)   Glucose, Bld 87  70 - 99 (mg/dL)   Calcium, Ion 7.82  9.56 - 1.32 (mmol/L)   TCO2 28  0 - 100 (mmol/L)   Hemoglobin 12.9 (*) 13.0 - 17.0 (g/dL)   HCT 21.3 (*) 08.6 - 52.0 (%)    CXR: clear EKG: atrial fibrillation with controlled rate, no STT changes Pacer interrogation reviewed: AF burden 39%, some rates above 120 but rate controlled the majority of the time.  Similar histograms compared to prior interrogation period in Jan 2013  Assessment/Plan: 71 yo WM with paroxysmal afib s/p PVI on multaq and recent left knee surgery  here with headache and mild dizziness x 2 days.  His INR is 5.3.    1. Headache: different than his normal headache. His neuro exam is normal and it likely represents rebound from percocet he is not used to taking.  However, given supratherapeutic INR, bleed should be ruled out - noncontrasted CT head  2. Paroxysmal AF: his AF burden is similar to what it was in January (39% vs 36%).  Dr. Ladona Ridgel offered repeat PVI.  I offered admission to monitor tonight and make plans for repeat PVI +/- loading with new drug; however, he does not want to miss his surgical follow-up appt tomorrow. - continue multaq for now - will let Dr. Ladona Ridgel know he is now interested in pursuing PIV or change in drug therapy  3. Supratherapeutic INR: - head CT as above given headache - if negative, plan on holding coumadin for the next  3 days (he has followup with INR clinic on Friday  4. Recent Knee surgery: small effusion, suspect normal post op, but could be small amount of blood in setting of supratherapeutic INR.  Not terribly warm.  Does not appear infected - ortho followup tomorrow scheduled.  If CT head negative, ok to d/c home holding coumadin until INR followup on Friday.  Will discuss with Dr. Ladona Ridgel follow-up either in clinic or via direct admission for further antiarrythmic therapies in the near future.   Abryanna Musolino 10/07/2011, 11:47 PM

## 2011-10-07 NOTE — ED Notes (Signed)
Feels jittery.

## 2011-10-07 NOTE — ED Provider Notes (Signed)
BP 128/77  Pulse 84  Temp(Src) 97.2 F (36.2 C) (Oral)  Resp 18  SpO2 97%   Medical screening examination/treatment/procedure(s) were conducted as a shared visit with non-physician practitioner(s) and myself.  I personally evaluated the patient during the encounter  RRR, CTAB. Intermittent lightheadedness. Found to have intermittent afib RVR over past 4 days by pacemaker interrogation. Supratherapeutic INR. Patient appears well. To be evaluated by [facility].  Forbes Cellar, MD 10/07/11 2332

## 2011-10-07 NOTE — ED Provider Notes (Addendum)
History     CSN: [id number]  Arrival date & time 10/07/11  1811   First MD Initiated Contact with Patient 10/07/11 2130      Chief Complaint  Patient presents with  . Hypertension    (Consider location/radiation/quality/duration/timing/severity/associated sxs/prior treatment) HPI Comments: Patient states that he goes in and out of a chair fibrillation, when he is in A. fib for any extended period of time.  He gets a headache and becomes dizzy.  Yesterday, when he went to physical therapy.  He was dizzy.  His blood pressure was elevated, and they did not allow him to participate.  He went home slept most of the day feeling ill with generalized weakness and headaches.  Woke up slightly better, went to the gym and work out on his own, but then again developed dizziness and headache began today.  His blood pressure at home and was elevated.  He called his doctor, Dr. Manus Gunning late in the day and was told to come to the emergency room for further evaluation.  He last had his Coumadin checked 2 weeks ago and his INR was 4.5 at that time.  He was told to cut down 2 days dose of his Coumadin and then resume normal use which is 5 mg 3 days a week, and 2.5 mg the other 4 days a week.  He was due tomorrow to have his INR checked.  Patient is a 71 y.o. male presenting with hypertension. The history is provided by the patient.  Hypertension This is a recurrent problem. The current episode started yesterday. The problem occurs intermittently. The problem has been resolved. Associated symptoms include diaphoresis. Pertinent negatives include no congestion, coughing, joint swelling, myalgias, nausea, numbness, sore throat, visual change or weakness. The symptoms are aggravated by exertion. He has tried nothing for the symptoms.    Past Medical History  Diagnosis Date  . Paroxysmal atrial fibrillation   . HTN (hypertension)   . Hyperlipidemia   . Migraines   . Arthritis of knee, right     end-stage  .  Bursitis   . Gout   . Tinnitus   . Depression   . Bronchitis   . Heart murmur   . Sleep apnea     wnl- sleep study, done 2004  . Anemia     since post op- knee replacement   . Complication of anesthesia     irritation in throat postop, makes a-fib worse   . DJD (degenerative joint disease)     OA- "everywhere"  . Anxiety     takes xanax 2 times per day, taken mostly for ringing in ears      Past Surgical History  Procedure Date  . Transesophageal echocardiogram 04/2008  . Transthoracic echocardiogram 2006,2009  . Bilteral thumb joint surgrery   . Right rotator cuff surgery 2006  . Cholecystectomy   . Rhinoplasty   . Uvulectomy   . Appendectomy   . Pacemaker insertion 06/06/2005    Medtronic EnRhythm dual-chamber. For sick sinus syndrome  . Right total hip arthroplasty   . Left rotator cuff repair   . Insert / replace / remove pacemaker     [employer]   . Cardiac catheterization 2005     which revealed 40% stenosis of the mid left anterior descending artery., ablation- 2009  . Tonsillectomy     as a child  . Joint replacement     L knee- 12/11,R hip- 1996  . Total knee revision 08/08/2011  Procedure: TOTAL KNEE REVISION;  Surgeon: Nestor Lewandowsky, MD;  Location: [facility] OR;  Service: Orthopedics;  Laterality: Left;  DEPUY/SIGMA    Family History  Problem Relation Age of Onset  . Coronary artery disease Mother   . Other Mother     Cerebrovascular Disease  . Cancer    . Anesthesia problems Neg Hx   . Hypotension Neg Hx   . Malignant hyperthermia Neg Hx   . Pseudochol deficiency Neg Hx     History  Substance Use Topics  . Smoking status: Never Smoker   . Smokeless tobacco: Not on file  . Alcohol Use: Yes     rare use      Review of Systems  Constitutional: Positive for diaphoresis.  HENT: Negative for congestion and sore throat.   Respiratory: Negative for cough.   Gastrointestinal: Negative for nausea.  Musculoskeletal: Negative for myalgias and joint  swelling.  Neurological: Negative for weakness and numbness.    Allergies  Iohexol; Meperidine hcl; and Prednisone  Home Medications   Current Outpatient Rx  Name Route Sig Dispense Refill  . ALPRAZOLAM 0.5 MG PO TABS Oral Take 0.5 mg by mouth 2 (two) times daily.     Marland Kitchen DRONEDARONE HCL 400 MG PO TABS Oral Take 400 mg by mouth 2 (two) times daily with a meal.    . FERROUS SULFATE 325 (65 FE) MG PO TABS Oral Take 325 mg by mouth daily with breakfast.     . FLUOXETINE HCL 40 MG PO CAPS Oral Take 40 mg by mouth daily.     Marland Kitchen NAPROXEN SODIUM 220 MG PO TABS Oral Take 220 mg by mouth 2 (two) times daily.    Marland Kitchen POTASSIUM CHLORIDE CRYS ER 20 MEQ PO TBCR Oral Take 1 tablet (20 mEq total) by mouth daily. 30 tablet 6  . ALIGN 4 MG PO CAPS Oral Take 1 capsule by mouth daily.      Marland Kitchen PROPRANOLOL HCL 20 MG PO TABS Oral Take 20 mg by mouth 3 (three) times daily.    . WARFARIN SODIUM 5 MG PO TABS Oral Take 2.5-5 mg by mouth daily. Takes 5 mg on tuesdays, Thursdays, & Saturdays Takes 2.5 mg the rest of the week      BP 128/77  Pulse 84  Temp(Src) 97.2 F (36.2 C) (Oral)  Resp 18  SpO2 97%  Physical Exam  Constitutional: He appears well-developed.  HENT:  Head: Normocephalic.  Eyes: Pupils are equal, round, and reactive to light.  Neck: Normal range of motion.  Cardiovascular: Normal rate.   Pulmonary/Chest: Effort normal.  Abdominal: Soft.  Musculoskeletal: Normal range of motion.  Neurological: He is alert.  Skin: Skin is warm.    ED Course  Procedures (including critical care time)  Labs Reviewed  PROTIME-INR - Abnormal; Notable for the following:    Prothrombin Time 49.4 (*)    INR 5.31 (*)    All other components within normal limits  CBC - Abnormal; Notable for the following:    Hemoglobin 12.9 (*)    HCT 38.0 (*)    All other components within normal limits  DIFFERENTIAL - Abnormal; Notable for the following:    Eosinophils Relative 7 (*)    All other components within  normal limits  POCT I-STAT, CHEM 8 - Abnormal; Notable for the following:    Hemoglobin 12.9 (*)    HCT 38.0 (*)    All other components within normal limits  GLUCOSE, CAPILLARY  POCT I-STAT TROPONIN  I   Dg Chest 2 View  10/07/2011  *RADIOLOGY REPORT*  Clinical Data: Dizziness and weakness.  Atrial fibrillation.  CHEST - 2 VIEW  Comparison: 08/04/2011  Findings: Right mid lung scarring again seen. Both lungs otherwise clear.  No evidence of pleural effusion.  Mild cardiomegaly stable. Dual lead transvenous pacemaker remains in appropriate position.  IMPRESSION: Stable mild cardiomegaly and right lung scarring.  No active disease.  Original Report Authenticated By: Danae Orleans, M.D.   Ct Head Wo Contrast  10/08/2011  *RADIOLOGY REPORT*  Clinical Data: Dizziness.  Hypertension.  Atrial fibrillation.  CT HEAD WITHOUT CONTRAST  Technique:  Contiguous axial images were obtained from the base of the skull through the vertex without contrast.  Comparison: 06/10/2008  Findings: The brain stem, cerebellum, cerebral peduncles, thalami, basal ganglia, basilar cisterns, and ventricular system appear unremarkable.  No intracranial hemorrhage, mass lesion, or acute infarction is identified.  Chronic left maxillary sinusitis noted.  IMPRESSION:  1.  Mild chronic left maxillary sinusitis.   Otherwise, no significant abnormality identified.  Original Report Authenticated By: Dellia Cloud, M.D.     1. Headache   2. Warfarin-induced coagulopathy   3. Atrial fibrillation     [employer] report   A-fib rhythm  Intermittent for past several days with rates to 120  Device in proper working order With our cardiology has been to the bedside for evaluation.  They have reviewed.  His labs, EKG, discussed options with the patient.  He will be seen by Dr. Ladona Ridgel in the office tomorrow to discuss additional ablation versus change in his antiarrhythmic.  He has been instructed to hold the next 2 doses of Coumadin  and to keep his appointment at the Coumadin clinic on Friday for a recheck of his level.  His head CT is negative, which is reassuring since his INR is 5.4 MDM   I have requested EKG, chest x-ray, labs, including INR, and troponin.  Electrolytes, CBC, and interrogation of his pacemaker by [employer]        Arman Filter, NP 10/07/11 2133  Arman Filter, NP 10/08/11 0047  Arman Filter, NP 01/24/12 2121

## 2011-10-07 NOTE — ED Notes (Signed)
At physical therapy became dizzy. Came home and slept. pcp told to come here to ED. bp 163/98 but went down last night and back up this morning. Did work out today.

## 2011-10-08 ENCOUNTER — Emergency Department (HOSPITAL_COMMUNITY): Payer: Medicare Other

## 2011-10-08 ENCOUNTER — Telehealth: Payer: Self-pay | Admitting: Internal Medicine

## 2011-10-08 DIAGNOSIS — R42 Dizziness and giddiness: Secondary | ICD-10-CM | POA: Diagnosis not present

## 2011-10-08 DIAGNOSIS — I10 Essential (primary) hypertension: Secondary | ICD-10-CM | POA: Diagnosis not present

## 2011-10-08 DIAGNOSIS — I4891 Unspecified atrial fibrillation: Secondary | ICD-10-CM | POA: Diagnosis not present

## 2011-10-08 NOTE — Discharge Instructions (Signed)
Atrial Fibrillation Atrial fibrillation is an abnormal heartbeat (rhythm). It can cause your heart rate to be faster or slower than normal, and can cause clots of blood to form in your heart. These clots can cause other health problems. Atrial fibrillation may be caused by a heart attack, lung problem, or certain medicine. Sometimes the cause of atrial fibrillation is not found. HOME CARE  Take blood thinning medicine (anticoagulants) as told by your doctor. Your doctor will need to draw your blood to check lab values if you take blood thinners.   If you had a cardioversion, limit your activity as told by your doctor.   Learn how to check your heartbeat (pulse) for an abnormal or irregular beat. Your doctor can show you how.   Ask your doctor if it is okay to exercise.   Only take medicine as told by your doctor.  GET HELP RIGHT AWAY IF:   You have trouble breathing or feel dizzy.   You have puffy (swollen) feet or ankles.   You have blood in your pee (urine) or poop (bowel movement).   You feel your heart "skipping" beats.   You feel your heart "racing" or beating fast.   You have weakness in your arms or legs.   You have trouble talking, seeing, or thinking.   You have chest pain or pain in your arm or jaw.  MAKE SURE YOU:   Understand these instructions.   Will watch your condition.   Will get help right away if you are not doing well or get worse.  Document Released: 02/19/2008 Document Revised: 05/01/2011 Document Reviewed: 08/30/2009 [facility] Patient Information 2012 [place], [place]. Hold your next 2 doses of Coumadin.  Keep your appointment at the Coumadin clinic on Friday  .  Tonight, your INR was 5.4 You were seen by cardiology if you do not hear from Dr. Ladona Ridgel by 11:00 tomorrow.  Please call his office for an appointment for further evaluation Your head CT is normal Your pacemaker was interrogated and is working properly.  As, discussed, you've been in and out of  atrial fibrillation over the last 3-4 days with a top rate of 120 beats per minute

## 2011-10-08 NOTE — Telephone Encounter (Signed)
Per Dr. Ladona Ridgel pt needs to be set up for ablation told pt he will get a call back

## 2011-10-09 DIAGNOSIS — Z96659 Presence of unspecified artificial knee joint: Secondary | ICD-10-CM | POA: Diagnosis not present

## 2011-10-09 DIAGNOSIS — M25569 Pain in unspecified knee: Secondary | ICD-10-CM | POA: Diagnosis not present

## 2011-10-10 ENCOUNTER — Encounter: Payer: Self-pay | Admitting: *Deleted

## 2011-10-10 ENCOUNTER — Ambulatory Visit (INDEPENDENT_AMBULATORY_CARE_PROVIDER_SITE_OTHER): Payer: Medicare Other | Admitting: Pharmacist

## 2011-10-10 DIAGNOSIS — Z7901 Long term (current) use of anticoagulants: Secondary | ICD-10-CM | POA: Diagnosis not present

## 2011-10-10 DIAGNOSIS — I4891 Unspecified atrial fibrillation: Secondary | ICD-10-CM | POA: Diagnosis not present

## 2011-10-10 LAB — POCT INR: INR: 3

## 2011-10-10 NOTE — Telephone Encounter (Signed)
Will discuss with Dr Johney Frame and see when he needs to be worked in for office visit

## 2011-10-14 DIAGNOSIS — M25569 Pain in unspecified knee: Secondary | ICD-10-CM | POA: Diagnosis not present

## 2011-10-17 DIAGNOSIS — Z96659 Presence of unspecified artificial knee joint: Secondary | ICD-10-CM | POA: Diagnosis not present

## 2011-10-17 DIAGNOSIS — M25569 Pain in unspecified knee: Secondary | ICD-10-CM | POA: Diagnosis not present

## 2011-10-22 DIAGNOSIS — M25569 Pain in unspecified knee: Secondary | ICD-10-CM | POA: Diagnosis not present

## 2011-10-22 DIAGNOSIS — Z96659 Presence of unspecified artificial knee joint: Secondary | ICD-10-CM | POA: Diagnosis not present

## 2011-10-22 NOTE — Telephone Encounter (Signed)
Will have Melissa to schedule patient to see Dr Johney Frame on 10/27/11

## 2011-10-24 ENCOUNTER — Ambulatory Visit (INDEPENDENT_AMBULATORY_CARE_PROVIDER_SITE_OTHER): Payer: Medicare Other

## 2011-10-24 DIAGNOSIS — Z7901 Long term (current) use of anticoagulants: Secondary | ICD-10-CM | POA: Diagnosis not present

## 2011-10-24 DIAGNOSIS — I4891 Unspecified atrial fibrillation: Secondary | ICD-10-CM | POA: Diagnosis not present

## 2011-10-24 DIAGNOSIS — M25569 Pain in unspecified knee: Secondary | ICD-10-CM | POA: Diagnosis not present

## 2011-10-24 DIAGNOSIS — Z96659 Presence of unspecified artificial knee joint: Secondary | ICD-10-CM | POA: Diagnosis not present

## 2011-10-24 LAB — POCT INR: INR: 3.4

## 2011-10-27 ENCOUNTER — Encounter: Payer: Self-pay | Admitting: Internal Medicine

## 2011-10-27 ENCOUNTER — Ambulatory Visit (INDEPENDENT_AMBULATORY_CARE_PROVIDER_SITE_OTHER): Payer: Medicare Other | Admitting: Internal Medicine

## 2011-10-27 VITALS — BP 118/78 | HR 62 | Ht 67.5 in | Wt 189.0 lb

## 2011-10-27 DIAGNOSIS — Z7901 Long term (current) use of anticoagulants: Secondary | ICD-10-CM | POA: Diagnosis not present

## 2011-10-27 DIAGNOSIS — I4891 Unspecified atrial fibrillation: Secondary | ICD-10-CM

## 2011-10-27 DIAGNOSIS — Z950 Presence of cardiac pacemaker: Secondary | ICD-10-CM | POA: Diagnosis not present

## 2011-10-27 DIAGNOSIS — I10 Essential (primary) hypertension: Secondary | ICD-10-CM

## 2011-10-27 DIAGNOSIS — M25569 Pain in unspecified knee: Secondary | ICD-10-CM | POA: Diagnosis not present

## 2011-10-27 DIAGNOSIS — Z96659 Presence of unspecified artificial knee joint: Secondary | ICD-10-CM | POA: Diagnosis not present

## 2011-10-27 NOTE — Patient Instructions (Addendum)
Your physician has recommended that you have an ablation. Catheter ablation is a medical procedure used to treat some cardiac arrhythmias (irregular heartbeats). During catheter ablation, a long, thin, flexible tube is put into a blood vessel in your groin (upper thigh), or neck. This tube is called an ablation catheter. It is then guided to your heart through the blood vessel. Radio frequency waves destroy small areas of heart tissue where abnormal heartbeats may cause an arrhythmia to start. Please see the instruction sheet given to you today. 7-9/13 Be at the hospital at 5:30am  Your physician has requested that you have an echocardiogram. Echocardiography is a painless test that uses sound waves to create images of your heart. It provides your doctor with information about the size and shape of your heart and how well your heart's chambers and valves are working. This procedure takes approximately one hour. There are no restrictions for this procedure.   Your physician has requested that you have a TEE. During a TEE, sound waves are used to create images of your heart. It provides your doctor with information about the size and shape of your heart and how well your heart's chambers and valves are working. In this test, a transducer is attached to the end of a flexible tube that's guided down your throat and into your esophagus (the tube leading from you mouth to your stomach) to get a more detailed image of your heart. You are not awake for the procedure. Please see the instruction sheet given to you today. For further information please visit [email or web address] will call you with time  Your physician recommends that you return for lab work on 11/25/11 at 10:00am  You do not have to be fasting    Weekly INR's starting next week

## 2011-10-30 ENCOUNTER — Encounter: Payer: Self-pay | Admitting: Internal Medicine

## 2011-10-30 DIAGNOSIS — M25569 Pain in unspecified knee: Secondary | ICD-10-CM | POA: Diagnosis not present

## 2011-10-30 DIAGNOSIS — Z96659 Presence of unspecified artificial knee joint: Secondary | ICD-10-CM | POA: Diagnosis not present

## 2011-10-30 NOTE — Assessment & Plan Note (Signed)
Stable No change required today  

## 2011-10-30 NOTE — Assessment & Plan Note (Signed)
stable °

## 2011-10-30 NOTE — Assessment & Plan Note (Signed)
Will need weekly INRs prior to ablation

## 2011-10-30 NOTE — Progress Notes (Signed)
PCP:  Thora Lance, MD, MD Primary EP:  Dr Ladona Ridgel  The patient presents today for routine electrophysiology followup.  Since last being seen in our clinic, the patient reports doing very well.  He is primarily followed by Dr Ladona Ridgel though he underwent afib ablation by me several years ago.  He did very well s/p ablation without symptomatic recurrence for several years.  Recently he has had difficulty with DJD and chronic knee pain requiring several surgical procedures.  In the midst of this, he has developed recurrent symptomatic atrial fibrillation.  He reports symptoms of fatigue and decreased exercise tolerance in afib.  Pacemaker interrogation has reviewed afib 30% of the time.  He is adequately anticoagulated chronically.  He continues to have afib despite medical therapy with multaq.  Today, he denies symptoms of palpitations, chest pain, shortness of breath, orthopnea, PND, lower extremity edema, dizziness, presyncope, syncope, or neurologic sequela.  The patient feels that he is tolerating medications without difficulties and is otherwise without complaint today.   Past Medical History  Diagnosis Date  . Paroxysmal atrial fibrillation   . HTN (hypertension)   . Hyperlipidemia   . Migraines   . Arthritis of knee, right     end-stage  . Bursitis   . Gout   . Tinnitus   . Depression   . Bronchitis   . Heart murmur   . Sleep apnea     wnl- sleep study, done 2004  . Anemia     since post op- knee replacement   . Complication of anesthesia     irritation in throat postop, makes a-fib worse   . DJD (degenerative joint disease)     OA- "everywhere"  . Anxiety     takes xanax 2 times per day, taken mostly for ringing in ears     Past Surgical History  Procedure Date  . Transesophageal echocardiogram 04/2008  . Transthoracic echocardiogram 2006,2009  . Bilteral thumb joint surgrery   . Right rotator cuff surgery 2006  . Cholecystectomy   . Rhinoplasty   . Uvulectomy   .  Appendectomy   . Pacemaker insertion 06/06/2005    Medtronic EnRhythm dual-chamber. For sick sinus syndrome  . Right total hip arthroplasty   . Left rotator cuff repair   . Cardiac catheterization 2005     which revealed 40% stenosis of the mid left anterior descending artery., ablation- 2009  . Tonsillectomy     as a child  . Joint replacement     L knee- 12/11,R hip- 1996  . Total knee revision 08/08/2011    Procedure: TOTAL KNEE REVISION;  Surgeon: Nestor Lewandowsky, MD;  Location: [facility] OR;  Service: Orthopedics;  Laterality: Left;  DEPUY/SIGMA  . Atrial fibrillation ablation 2009    PVI by Dr Johney Frame    Current Outpatient Prescriptions  Medication Sig Dispense Refill  . ALPRAZolam (XANAX) 0.5 MG tablet Take 0.5 mg by mouth 2 (two) times daily.       . CELEBREX 200 MG capsule Take 200 mg by mouth 2 (two) times daily.       Marland Kitchen dronedarone (MULTAQ) 400 MG tablet Take 400 mg by mouth 2 (two) times daily with a meal.      . ferrous sulfate 325 (65 FE) MG tablet Take 325 mg by mouth daily with breakfast.       . FLUoxetine (PROZAC) 40 MG capsule Take 40 mg by mouth daily.       . potassium chloride  SA (K-DUR,KLOR-CON) 20 MEQ tablet Take 1 tablet (20 mEq total) by mouth daily.  30 tablet  6  . Probiotic Product (ALIGN) 4 MG CAPS Take 1 capsule by mouth daily.        . propranolol (INDERAL) 20 MG tablet Take 20 mg by mouth 3 (three) times daily.      Marland Kitchen warfarin (COUMADIN) 5 MG tablet Take 2.5-5 mg by mouth daily. Takes 5 mg on tuesdays, Thursdays, & Saturdays Takes 2.5 mg the rest of the week        Allergies  Allergen Reactions  . Iohexol      Code: HIVES, Desc: POST MYELOGRAM.  PT ALSO SAID THIS HAPPENED A YEAR AGO WITH A MYELOGRAM AND WITH ESI'S., Onset Date: [id number]   . Meperidine Hcl     REACTION: unspecified  . Prednisone     Redness of skin & jittery    History   Social History  . Marital Status: Married    Spouse Name: N/A    Number of Children: 2  . Years of Education: N/A    Occupational History  . retired    Social History Main Topics  . Smoking status: Never Smoker   . Smokeless tobacco: Not on file  . Alcohol Use: Yes     rare use  . Drug Use: No  . Sexually Active: Not on file   Other Topics Concern  . Not on file   Social History Narrative  . No narrative on file    Family History  Problem Relation Age of Onset  . Coronary artery disease Mother   . Other Mother     Cerebrovascular Disease  . Cancer    . Anesthesia problems Neg Hx   . Hypotension Neg Hx   . Malignant hyperthermia Neg Hx   . Pseudochol deficiency Neg Hx     ROS-  All systems are reviewed and are negative except as outlined in the HPI above   Physical Exam: Filed Vitals:   10/27/11 1211  BP: 118/78  Pulse: 62  Height: 5' 7.5" (1.715 m)  Weight: 189 lb (85.73 kg)    GEN- The patient is well appearing, alert and oriented x 3 today.   Head- normocephalic, atraumatic Eyes-  Sclera clear, conjunctiva pink Ears- hearing intact Oropharynx- clear Neck- supple, no JVP Lymph- no cervical lymphadenopathy Lungs- Clear to ausculation bilaterally, normal work of breathing Chest- pacemaker pocket is well healed Heart- Regular rate and rhythm, no murmurs, rubs or gallops, PMI not laterally displaced GI- soft, NT, ND, + BS Extremities- no clubbing, cyanosis, or edema MS- s/p knee surgery Skin- no rash or lesion Psych- euthymic mood, full affect Neuro- strength and sensation are intact  Pacemaker interrogation- reviewed in detail today,  See PACEART report  Assessment and Plan:

## 2011-10-30 NOTE — Assessment & Plan Note (Signed)
The patient has developed recurrent symptomatic paroxysmal atrial fibrillation.  He previously had good response to afib ablation.  He has presently been taking multaq without improvement. Therapeutic strategies for afib including medicine and ablation were discussed in detail with the patient today. Risk, benefits, and alternatives to repeat EP study and radiofrequency ablation for afib were also discussed in detail today. These risks include but are not limited to stroke, bleeding, vascular damage, tamponade, perforation, damage to the esophagus, lungs, and other structures, pulmonary vein stenosis, worsening renal function, pacemaker lead dislodgement, and death. The patient understands these risk and wishes to proceed.  We will therefore proceed with catheter ablation at the next available time.

## 2011-11-03 DIAGNOSIS — Z96659 Presence of unspecified artificial knee joint: Secondary | ICD-10-CM | POA: Diagnosis not present

## 2011-11-03 DIAGNOSIS — M25569 Pain in unspecified knee: Secondary | ICD-10-CM | POA: Diagnosis not present

## 2011-11-06 DIAGNOSIS — Z96659 Presence of unspecified artificial knee joint: Secondary | ICD-10-CM | POA: Diagnosis not present

## 2011-11-06 DIAGNOSIS — M25569 Pain in unspecified knee: Secondary | ICD-10-CM | POA: Diagnosis not present

## 2011-11-07 ENCOUNTER — Ambulatory Visit (INDEPENDENT_AMBULATORY_CARE_PROVIDER_SITE_OTHER): Payer: Medicare Other | Admitting: *Deleted

## 2011-11-07 ENCOUNTER — Ambulatory Visit (HOSPITAL_COMMUNITY): Payer: Medicare Other | Attending: Internal Medicine

## 2011-11-07 DIAGNOSIS — Z7901 Long term (current) use of anticoagulants: Secondary | ICD-10-CM | POA: Diagnosis not present

## 2011-11-07 DIAGNOSIS — I059 Rheumatic mitral valve disease, unspecified: Secondary | ICD-10-CM | POA: Insufficient documentation

## 2011-11-07 DIAGNOSIS — Z950 Presence of cardiac pacemaker: Secondary | ICD-10-CM

## 2011-11-07 DIAGNOSIS — I4891 Unspecified atrial fibrillation: Secondary | ICD-10-CM | POA: Diagnosis not present

## 2011-11-07 DIAGNOSIS — I517 Cardiomegaly: Secondary | ICD-10-CM | POA: Insufficient documentation

## 2011-11-07 LAB — POCT INR: INR: 2

## 2011-11-07 NOTE — Progress Notes (Signed)
Echocardiogram performed.  

## 2011-11-11 DIAGNOSIS — M25569 Pain in unspecified knee: Secondary | ICD-10-CM | POA: Diagnosis not present

## 2011-11-13 ENCOUNTER — Ambulatory Visit (INDEPENDENT_AMBULATORY_CARE_PROVIDER_SITE_OTHER): Payer: Medicare Other | Admitting: *Deleted

## 2011-11-13 ENCOUNTER — Other Ambulatory Visit: Payer: Self-pay | Admitting: *Deleted

## 2011-11-13 ENCOUNTER — Encounter: Payer: Self-pay | Admitting: *Deleted

## 2011-11-13 DIAGNOSIS — I4891 Unspecified atrial fibrillation: Secondary | ICD-10-CM | POA: Diagnosis not present

## 2011-11-13 DIAGNOSIS — Z7901 Long term (current) use of anticoagulants: Secondary | ICD-10-CM | POA: Diagnosis not present

## 2011-11-13 DIAGNOSIS — Z96659 Presence of unspecified artificial knee joint: Secondary | ICD-10-CM | POA: Diagnosis not present

## 2011-11-13 DIAGNOSIS — M25569 Pain in unspecified knee: Secondary | ICD-10-CM | POA: Diagnosis not present

## 2011-11-18 DIAGNOSIS — Z96659 Presence of unspecified artificial knee joint: Secondary | ICD-10-CM | POA: Diagnosis not present

## 2011-11-18 DIAGNOSIS — M25569 Pain in unspecified knee: Secondary | ICD-10-CM | POA: Diagnosis not present

## 2011-11-20 ENCOUNTER — Ambulatory Visit (INDEPENDENT_AMBULATORY_CARE_PROVIDER_SITE_OTHER): Payer: Medicare Other | Admitting: *Deleted

## 2011-11-20 ENCOUNTER — Encounter (HOSPITAL_COMMUNITY): Payer: Self-pay | Admitting: Pharmacy Technician

## 2011-11-20 DIAGNOSIS — I4891 Unspecified atrial fibrillation: Secondary | ICD-10-CM

## 2011-11-20 DIAGNOSIS — Z7901 Long term (current) use of anticoagulants: Secondary | ICD-10-CM

## 2011-11-21 DIAGNOSIS — M25569 Pain in unspecified knee: Secondary | ICD-10-CM | POA: Diagnosis not present

## 2011-11-21 DIAGNOSIS — Z96659 Presence of unspecified artificial knee joint: Secondary | ICD-10-CM | POA: Diagnosis not present

## 2011-11-25 ENCOUNTER — Other Ambulatory Visit (INDEPENDENT_AMBULATORY_CARE_PROVIDER_SITE_OTHER): Payer: Medicare Other

## 2011-11-25 DIAGNOSIS — Z950 Presence of cardiac pacemaker: Secondary | ICD-10-CM | POA: Diagnosis not present

## 2011-11-25 DIAGNOSIS — I4891 Unspecified atrial fibrillation: Secondary | ICD-10-CM | POA: Diagnosis not present

## 2011-11-25 LAB — CBC WITH DIFFERENTIAL/PLATELET
Basophils Relative: 0.8 % (ref 0.0–3.0)
Eosinophils Relative: 5 % (ref 0.0–5.0)
HCT: 39.7 % (ref 39.0–52.0)
Hemoglobin: 13.2 g/dL (ref 13.0–17.0)
Lymphs Abs: 1.8 10*3/uL (ref 0.7–4.0)
Monocytes Relative: 10.3 % (ref 3.0–12.0)
Platelets: 243 10*3/uL (ref 150.0–400.0)
RBC: 4.54 Mil/uL (ref 4.22–5.81)
WBC: 6 10*3/uL (ref 4.5–10.5)

## 2011-11-25 LAB — BASIC METABOLIC PANEL
BUN: 18 mg/dL (ref 6–23)
CO2: 26 mEq/L (ref 19–32)
Calcium: 8.8 mg/dL (ref 8.4–10.5)
Glucose, Bld: 86 mg/dL (ref 70–99)
Sodium: 138 mEq/L (ref 135–145)

## 2011-11-25 LAB — PROTIME-INR: INR: 2.8 ratio — ABNORMAL HIGH (ref 0.8–1.0)

## 2011-11-26 DIAGNOSIS — M25569 Pain in unspecified knee: Secondary | ICD-10-CM | POA: Diagnosis not present

## 2011-11-28 ENCOUNTER — Ambulatory Visit (INDEPENDENT_AMBULATORY_CARE_PROVIDER_SITE_OTHER): Payer: Medicare Other | Admitting: *Deleted

## 2011-11-28 DIAGNOSIS — Z7901 Long term (current) use of anticoagulants: Secondary | ICD-10-CM | POA: Diagnosis not present

## 2011-11-28 DIAGNOSIS — I4891 Unspecified atrial fibrillation: Secondary | ICD-10-CM

## 2011-12-01 ENCOUNTER — Encounter (HOSPITAL_COMMUNITY): Payer: Self-pay | Admitting: *Deleted

## 2011-12-01 ENCOUNTER — Encounter (HOSPITAL_COMMUNITY)
Admission: RE | Disposition: A | Discharge status: Home / Self Care | Payer: Self-pay | Source: Ambulatory Visit | Attending: Internal Medicine

## 2011-12-01 ENCOUNTER — Ambulatory Visit (HOSPITAL_COMMUNITY)
Admission: RE | Admit: 2011-12-01 | Discharge: 2011-12-01 | Disposition: A | Discharge status: Home / Self Care | Payer: Medicare Other | Source: Ambulatory Visit | Attending: Internal Medicine | Admitting: Internal Medicine

## 2011-12-01 DIAGNOSIS — I10 Essential (primary) hypertension: Secondary | ICD-10-CM | POA: Diagnosis not present

## 2011-12-01 DIAGNOSIS — I4891 Unspecified atrial fibrillation: Secondary | ICD-10-CM

## 2011-12-01 DIAGNOSIS — Z950 Presence of cardiac pacemaker: Secondary | ICD-10-CM | POA: Diagnosis not present

## 2011-12-01 DIAGNOSIS — I4892 Unspecified atrial flutter: Secondary | ICD-10-CM | POA: Insufficient documentation

## 2011-12-01 DIAGNOSIS — E785 Hyperlipidemia, unspecified: Secondary | ICD-10-CM | POA: Diagnosis not present

## 2011-12-01 HISTORY — DX: Other specified postprocedural states: R11.2

## 2011-12-01 HISTORY — PX: TEE WITHOUT CARDIOVERSION: SHX5443

## 2011-12-01 HISTORY — DX: Family history of other specified conditions: Z84.89

## 2011-12-01 HISTORY — DX: Other specified postprocedural states: Z98.890

## 2011-12-01 SURGERY — ECHOCARDIOGRAM, TRANSESOPHAGEAL
Anesthesia: Moderate Sedation

## 2011-12-01 MED ORDER — FENTANYL CITRATE 0.05 MG/ML IJ SOLN
INTRAMUSCULAR | Status: AC
  Filled 2011-12-01: qty 2

## 2011-12-01 MED ORDER — LIDOCAINE VISCOUS 2 % MT SOLN
OROMUCOSAL | Status: AC
  Filled 2011-12-01: qty 15

## 2011-12-01 MED ORDER — BENZOCAINE 20 % MT SOLN: 1.0000 "application " | OROMUCOSAL | Status: DC | PRN

## 2011-12-01 MED ORDER — SODIUM CHLORIDE 0.45 % IV SOLN
INTRAVENOUS | Status: DC
  Administered 2011-12-01: 500 mL via INTRAVENOUS

## 2011-12-01 MED ORDER — MIDAZOLAM HCL 10 MG/2ML IJ SOLN
INTRAMUSCULAR | Status: DC | PRN
  Administered 2011-12-01 (×4): 2 mg via INTRAVENOUS

## 2011-12-01 MED ORDER — WARFARIN SODIUM 5 MG PO TABS: 5.0000 mg | ORAL_TABLET | Freq: Once | ORAL | Status: DC

## 2011-12-01 MED ORDER — MIDAZOLAM HCL 10 MG/2ML IJ SOLN: 10.0000 mg | Freq: Once | INTRAMUSCULAR | Status: DC

## 2011-12-01 MED ORDER — FENTANYL CITRATE 0.05 MG/ML IJ SOLN
INTRAMUSCULAR | Status: DC | PRN
  Administered 2011-12-01 (×4): 25 ug via INTRAVENOUS

## 2011-12-01 MED ORDER — LIDOCAINE VISCOUS 2 % MT SOLN
OROMUCOSAL | Status: DC | PRN
  Administered 2011-12-01: 20 mL via OROMUCOSAL

## 2011-12-01 MED ORDER — MIDAZOLAM HCL 10 MG/2ML IJ SOLN
INTRAMUSCULAR | Status: AC
  Filled 2011-12-01: qty 2

## 2011-12-01 MED ORDER — ENOXAPARIN SODIUM 100 MG/ML ~~LOC~~ SOLN
1.0000 mg/kg | Freq: Once | SUBCUTANEOUS | Status: AC
  Administered 2011-12-01: 85 mg via SUBCUTANEOUS
  Filled 2011-12-01: qty 1

## 2011-12-01 MED ORDER — FENTANYL CITRATE 0.05 MG/ML IJ SOLN: 250.0000 ug | Freq: Once | INTRAMUSCULAR | Status: DC

## 2011-12-01 NOTE — Progress Notes (Signed)
  Echocardiogram Echocardiogram Transesophageal has been performed.  [name] [name] 12/01/2011, 3:22 PM

## 2011-12-01 NOTE — Op Note (Signed)
Full report to follow 

## 2011-12-02 ENCOUNTER — Ambulatory Visit (HOSPITAL_COMMUNITY)
Admission: RE | Admit: 2011-12-02 | Discharge: 2011-12-03 | Disposition: A | Discharge status: Home / Self Care | Payer: Medicare Other | Source: Ambulatory Visit | Attending: Internal Medicine | Admitting: Internal Medicine

## 2011-12-02 ENCOUNTER — Ambulatory Visit (HOSPITAL_COMMUNITY): Payer: Medicare Other | Admitting: Certified Registered"

## 2011-12-02 ENCOUNTER — Encounter (HOSPITAL_COMMUNITY): Payer: Self-pay | Admitting: Certified Registered"

## 2011-12-02 ENCOUNTER — Encounter (HOSPITAL_COMMUNITY)
Admission: RE | Disposition: A | Discharge status: Home / Self Care | Payer: Self-pay | Source: Ambulatory Visit | Attending: Internal Medicine

## 2011-12-02 ENCOUNTER — Encounter (HOSPITAL_COMMUNITY): Payer: Self-pay | Admitting: *Deleted

## 2011-12-02 DIAGNOSIS — D649 Anemia, unspecified: Secondary | ICD-10-CM | POA: Diagnosis not present

## 2011-12-02 DIAGNOSIS — I10 Essential (primary) hypertension: Secondary | ICD-10-CM | POA: Diagnosis present

## 2011-12-02 DIAGNOSIS — I4891 Unspecified atrial fibrillation: Secondary | ICD-10-CM | POA: Diagnosis present

## 2011-12-02 DIAGNOSIS — E785 Hyperlipidemia, unspecified: Secondary | ICD-10-CM | POA: Insufficient documentation

## 2011-12-02 DIAGNOSIS — Z950 Presence of cardiac pacemaker: Secondary | ICD-10-CM | POA: Diagnosis present

## 2011-12-02 DIAGNOSIS — Z7901 Long term (current) use of anticoagulants: Secondary | ICD-10-CM

## 2011-12-02 DIAGNOSIS — I4892 Unspecified atrial flutter: Secondary | ICD-10-CM | POA: Diagnosis not present

## 2011-12-02 HISTORY — PX: ATRIAL FIBRILLATION ABLATION: SHX5456

## 2011-12-02 LAB — POCT ACTIVATED CLOTTING TIME
Activated Clotting Time: 314 seconds
Activated Clotting Time: 259 seconds
Activated Clotting Time: 199 seconds

## 2011-12-02 LAB — MRSA PCR SCREENING: MRSA by PCR: NEGATIVE

## 2011-12-02 SURGERY — ATRIAL FIBRILLATION ABLATION
Anesthesia: General

## 2011-12-02 MED ORDER — FLUOXETINE HCL 20 MG PO CAPS
40.0000 mg | ORAL_CAPSULE | Freq: Every day | ORAL | Status: DC
  Administered 2011-12-02 – 2011-12-03 (×2): 40 mg via ORAL
  Filled 2011-12-02 (×2): qty 2

## 2011-12-02 MED ORDER — ENOXAPARIN SODIUM 100 MG/ML ~~LOC~~ SOLN
1.0000 mg/kg | Freq: Two times a day (BID) | SUBCUTANEOUS | Status: DC
  Filled 2011-12-02 (×2): qty 1

## 2011-12-02 MED ORDER — ONDANSETRON HCL 4 MG/2ML IJ SOLN: 4.0000 mg | Freq: Four times a day (QID) | INTRAMUSCULAR | Status: DC | PRN

## 2011-12-02 MED ORDER — POTASSIUM CHLORIDE CRYS ER 20 MEQ PO TBCR
20.0000 meq | EXTENDED_RELEASE_TABLET | Freq: Every day | ORAL | Status: DC
  Administered 2011-12-02 – 2011-12-03 (×2): 20 meq via ORAL
  Filled 2011-12-02 (×2): qty 1

## 2011-12-02 MED ORDER — FERROUS SULFATE 325 (65 FE) MG PO TABS
325.0000 mg | ORAL_TABLET | Freq: Every day | ORAL | Status: DC
  Administered 2011-12-03: 325 mg via ORAL
  Filled 2011-12-02 (×2): qty 1

## 2011-12-02 MED ORDER — MIDAZOLAM HCL 5 MG/5ML IJ SOLN
INTRAMUSCULAR | Status: DC | PRN
  Administered 2011-12-02: 2 mg via INTRAVENOUS

## 2011-12-02 MED ORDER — HYDROXYUREA 500 MG PO CAPS
ORAL_CAPSULE | ORAL | Status: AC
  Filled 2011-12-02: qty 1

## 2011-12-02 MED ORDER — FAMOTIDINE IN NACL 20-0.9 MG/50ML-% IV SOLN
INTRAVENOUS | Status: AC
  Filled 2011-12-02: qty 50

## 2011-12-02 MED ORDER — METHYLPREDNISOLONE SODIUM SUCC 125 MG IJ SOLR
INTRAMUSCULAR | Status: DC | PRN
  Administered 2011-12-02: 60 mg via INTRAVENOUS

## 2011-12-02 MED ORDER — ALPRAZOLAM 0.5 MG PO TABS
0.5000 mg | ORAL_TABLET | Freq: Two times a day (BID) | ORAL | Status: DC
Start: 2011-12-02 — End: 2011-12-03
  Administered 2011-12-02 – 2011-12-03 (×2): 0.5 mg via ORAL
  Filled 2011-12-02 (×2): qty 1

## 2011-12-02 MED ORDER — ENOXAPARIN SODIUM 100 MG/ML ~~LOC~~ SOLN
1.0000 mg/kg | Freq: Two times a day (BID) | SUBCUTANEOUS | Status: DC
  Administered 2011-12-02: 85 mg via SUBCUTANEOUS
  Administered 2011-12-03: 09:00:00 via SUBCUTANEOUS
  Filled 2011-12-02 (×3): qty 1

## 2011-12-02 MED ORDER — SODIUM CHLORIDE 0.9 % IJ SOLN: 3.0000 mL | INTRAMUSCULAR | Status: DC | PRN

## 2011-12-02 MED ORDER — SODIUM CHLORIDE 0.9 % IV SOLN: 250.0000 mL | INTRAVENOUS | Status: DC | PRN

## 2011-12-02 MED ORDER — WARFARIN - PHYSICIAN DOSING INPATIENT
Freq: Every day | Status: DC
  Administered 2011-12-02: 18:00:00

## 2011-12-02 MED ORDER — FENTANYL CITRATE 0.05 MG/ML IJ SOLN
INTRAMUSCULAR | Status: DC | PRN
  Administered 2011-12-02 (×2): 50 ug via INTRAVENOUS
  Administered 2011-12-02: 100 ug via INTRAVENOUS

## 2011-12-02 MED ORDER — ISOPROTERENOL HCL 0.2 MG/ML IJ SOLN
1000.0000 ug | INTRAVENOUS | Status: DC | PRN
  Administered 2011-12-02: 20 ug/min via INTRAVENOUS

## 2011-12-02 MED ORDER — EPHEDRINE SULFATE 50 MG/ML IJ SOLN
INTRAMUSCULAR | Status: DC | PRN
  Administered 2011-12-02: 10 mg via INTRAVENOUS
  Administered 2011-12-02: 5 mg via INTRAVENOUS
  Administered 2011-12-02: 10 mg via INTRAVENOUS

## 2011-12-02 MED ORDER — WARFARIN SODIUM 7.5 MG PO TABS
7.5000 mg | ORAL_TABLET | Freq: Once | ORAL | Status: AC
  Administered 2011-12-02: 7.5 mg via ORAL
  Filled 2011-12-02: qty 1

## 2011-12-02 MED ORDER — SODIUM CHLORIDE 0.9 % IJ SOLN
3.0000 mL | Freq: Two times a day (BID) | INTRAMUSCULAR | Status: DC
  Administered 2011-12-02 – 2011-12-03 (×2): 3 mL via INTRAVENOUS

## 2011-12-02 MED ORDER — ACETAMINOPHEN 325 MG PO TABS: 650.0000 mg | ORAL_TABLET | ORAL | Status: DC | PRN

## 2011-12-02 MED ORDER — PROPOFOL 10 MG/ML IV EMUL
INTRAVENOUS | Status: DC | PRN
  Administered 2011-12-02: 150 mg via INTRAVENOUS

## 2011-12-02 MED ORDER — SODIUM CHLORIDE 0.9 % IV SOLN
INTRAVENOUS | Status: DC | PRN
  Administered 2011-12-02: 08:00:00 via INTRAVENOUS

## 2011-12-02 MED ORDER — HYDROCODONE-ACETAMINOPHEN 5-325 MG PO TABS
1.0000 | ORAL_TABLET | ORAL | Status: DC | PRN
  Administered 2011-12-02: 2 via ORAL
  Filled 2011-12-02: qty 2

## 2011-12-02 MED ORDER — BUPIVACAINE HCL (PF) 0.25 % IJ SOLN
INTRAMUSCULAR | Status: AC
  Filled 2011-12-02: qty 60

## 2011-12-02 MED ORDER — LIDOCAINE HCL (CARDIAC) 20 MG/ML IV SOLN
INTRAVENOUS | Status: DC | PRN
  Administered 2011-12-02: 40 mg via INTRAVENOUS

## 2011-12-02 MED ORDER — DIPHENHYDRAMINE HCL 50 MG/ML IJ SOLN
INTRAMUSCULAR | Status: DC | PRN
  Administered 2011-12-02: 50 mg via INTRAVENOUS

## 2011-12-02 MED ORDER — HEPARIN SODIUM (PORCINE) 1000 UNIT/ML IJ SOLN
INTRAMUSCULAR | Status: DC | PRN
  Administered 2011-12-02: 4 mL via INTRAVENOUS
  Administered 2011-12-02: 12 mL via INTRAVENOUS

## 2011-12-02 NOTE — H&P (Signed)
PCP: Thora Lance, MD, MD  Primary EP: Dr Ladona Ridgel   The patient presents today for afib ablation. Since last being seen in our clinic, the patient reports doing very well. He is primarily followed by Dr Ladona Ridgel though he underwent afib ablation by me several years ago. He did very well s/p ablation without symptomatic recurrence for several years. Recently he has had difficulty with DJD and chronic knee pain requiring several surgical procedures. In the midst of this, he has developed recurrent symptomatic atrial fibrillation. He reports symptoms of fatigue and decreased exercise tolerance in afib. Pacemaker interrogation has reviewed afib 30% of the time. He is adequately anticoagulated chronically. He continues to have afib despite medical therapy with multaq. Today, he denies symptoms of palpitations, chest pain, shortness of breath, orthopnea, PND, lower extremity edema, dizziness, presyncope, syncope, or neurologic sequela. The patient feels that he is tolerating medications without difficulties and is otherwise without complaint today.  His INR yesterday was 1.7.  He received lovenox bridge overnight.  Past Medical History   Diagnosis  Date   .  Paroxysmal atrial fibrillation    .  HTN (hypertension)    .  Hyperlipidemia    .  Migraines    .  Arthritis of knee, right      end-stage   .  Bursitis    .  Gout    .  Tinnitus    .  Depression    .  Bronchitis    .  Heart murmur    .  Sleep apnea      wnl- sleep study, done 2004   .  Anemia      since post op- knee replacement   .  Complication of anesthesia      irritation in throat postop, makes a-fib worse   .  DJD (degenerative joint disease)      OA- "everywhere"   .  Anxiety      takes xanax 2 times per day, taken mostly for ringing in ears    Past Surgical History   Procedure  Date   .  Transesophageal echocardiogram  04/2008   .  Transthoracic echocardiogram  2006,2009   .  Bilteral thumb joint surgrery    .  Right  rotator cuff surgery  2006   .  Cholecystectomy    .  Rhinoplasty    .  Uvulectomy    .  Appendectomy    .  Pacemaker insertion  06/06/2005     Medtronic EnRhythm dual-chamber. For sick sinus syndrome   .  Right total hip arthroplasty    .  Left rotator cuff repair    .  Cardiac catheterization  2005     which revealed 40% stenosis of the mid left anterior descending artery., ablation- 2009   .  Tonsillectomy      as a child   .  Joint replacement      L knee- 12/11,R hip- 1996   .  Total knee revision  08/08/2011     Procedure: TOTAL KNEE REVISION; Surgeon: Nestor Lewandowsky, MD; Location: [facility] OR; Service: Orthopedics; Laterality: Left; DEPUY/SIGMA   .  Atrial fibrillation ablation  2009     PVI by Dr Johney Frame    Current Outpatient Prescriptions   Medication  Sig  Dispense  Refill   .  ALPRAZolam (XANAX) 0.5 MG tablet  Take 0.5 mg by mouth 2 (two) times daily.     .  CELEBREX 200 MG capsule  Take 200 mg by mouth 2 (two) times daily.     Marland Kitchen  dronedarone (MULTAQ) 400 MG tablet  Take 400 mg by mouth 2 (two) times daily with a meal.     .  ferrous sulfate 325 (65 FE) MG tablet  Take 325 mg by mouth daily with breakfast.     .  FLUoxetine (PROZAC) 40 MG capsule  Take 40 mg by mouth daily.     .  potassium chloride SA (K-DUR,KLOR-CON) 20 MEQ tablet  Take 1 tablet (20 mEq total) by mouth daily.  30 tablet  6   .  Probiotic Product (ALIGN) 4 MG CAPS  Take 1 capsule by mouth daily.     .  propranolol (INDERAL) 20 MG tablet  Take 20 mg by mouth 3 (three) times daily.     Marland Kitchen  warfarin (COUMADIN) 5 MG tablet  Take 2.5-5 mg by mouth daily. Takes 5 mg on tuesdays, Thursdays, & Saturdays  Takes 2.5 mg the rest of the week      Allergies   Allergen  Reactions   .  Iohexol      Code: HIVES, Desc: POST MYELOGRAM. PT ALSO SAID THIS HAPPENED A YEAR AGO WITH A MYELOGRAM AND WITH ESI'S., Onset Date: [id number]   .  Meperidine Hcl      REACTION: unspecified   .  Prednisone      Redness of skin & jittery     History    Social History   .  Marital Status:  Married     Spouse Name:  N/A     Number of Children:  2   .  Years of Education:  N/A    Occupational History   .  retired     Social History Main Topics   .  Smoking status:  Never Smoker   .  Smokeless tobacco:  Not on file   .  Alcohol Use:  Yes      rare use   .  Drug Use:  No   .  Sexually Active:  Not on file    Other Topics  Concern   .  Not on file    Social History Narrative   .  No narrative on file    Family History   Problem  Relation  Age of Onset   .  Coronary artery disease  Mother    .  Other  Mother       Cerebrovascular Disease    .  Cancer     .  Anesthesia problems  Neg Hx    .  Hypotension  Neg Hx    .  Malignant hyperthermia  Neg Hx    .  Pseudochol deficiency  Neg Hx     ROS- All systems are reviewed and are negative except as outlined in the HPI above   Physical Exam:  Filed Vitals:    10/27/11 1211   BP:  118/78   Pulse:  62   Height:  5' 7.5" (1.715 m)   Weight:  189 lb (85.73 kg)    GEN- The patient is well appearing, alert and oriented x 3 today.  Head- normocephalic, atraumatic  Eyes- Sclera clear, conjunctiva pink  Ears- hearing intact  Oropharynx- clear  Neck- supple, no JVP  Lymph- no cervical lymphadenopathy  Lungs- Clear to ausculation bilaterally, normal work of breathing  Chest- pacemaker pocket is well healed  Heart- Regular rate and rhythm, no murmurs, rubs or gallops, PMI  not laterally displaced  GI- soft, NT, ND, + BS  Extremities- no clubbing, cyanosis, or edema  MS- s/p knee surgery  Skin- no rash or lesion  Psych- euthymic mood, full affect  Neuro- strength and sensation are intact  Pacemaker interrogation- reviewed in detail today, See PACEART report  Assessment and Plan:   ATRIAL FIBRILLATION - Hillis Range, MD  The patient has developed recurrent symptomatic atrial fibrillation. He previously had good response to afib ablation. He has presently been  taking multaq without improvement.  Therapeutic strategies for afib including medicine and ablation were discussed in detail with the patient today. Risk, benefits, and alternatives to repeat EP study and radiofrequency ablation for afib were also discussed in detail today. These risks include but are not limited to stroke, bleeding, vascular damage, tamponade, perforation, damage to the esophagus, lungs, and other structures, pulmonary vein stenosis, worsening renal function, pacemaker lead dislodgement, and death. The patient understands these risk and wishes to proceed.  His INR is subtherapeutic.  He has been bridged with lovenox and TEE reveals no atrial thrombus.  I will plan to bridge with lovenox post procedure.  I have informed him that there may be a slight increase in stroke with this approach.  As lovenox bridge with afib ablation has been well reported, I think that this is a reasonable approach.  He wishes to proceed. In addition, he will receive premedication for prior contrast allergy this am.

## 2011-12-02 NOTE — Addendum Note (Signed)
Addendum  created 12/02/11 1655 by Kipp Brood, MD   Modules edited:Anesthesia Attestations, Notes Section

## 2011-12-02 NOTE — Progress Notes (Signed)
SHEATH PULLED OUT BY CARDIAC CATH STAFF ON BOTH RIGHT AND LEFT GROINS  AND FINISHED  THE WHOLE PROCESS AT 6PM  TOLERATED WELL. INSTRUCTIONS GIVEN TO PT, BEDREST TILL 11 PM . SITES CHECKED AT REGULAR INTERVAL. CONTINUE TO MONITOR.

## 2011-12-02 NOTE — Anesthesia Postprocedure Evaluation (Signed)
  Anesthesia Post-op Note  Patient: [name] [name]  Procedure(s) Performed: Procedure(s) (LRB): ATRIAL FIBRILLATION ABLATION (N/A)  Patient Location: PACU  Anesthesia Type: General  Level of Consciousness: awake, alert  and oriented  Airway and Oxygen Therapy: Patient Spontanous Breathing and Patient connected to nasal cannula oxygen  Post-op Pain: mild  Post-op Assessment: Post-op Vital signs reviewed and Patient's Cardiovascular Status Stable  Post-op Vital Signs: stable  Complications: No apparent anesthesia complications

## 2011-12-02 NOTE — Progress Notes (Signed)
ADMITTED FROM THE CATH LAB AWAKE AND ALERT, SHEATHS INTACT TO BOTH GROINS. NOTED WITH MINIMAL BLEEDING TO RIGHT GROIN REINFORCED WITH DRESSING. CONTINUE TO MONITOR.

## 2011-12-02 NOTE — Transfer of Care (Signed)
Immediate Anesthesia Transfer of Care Note  Patient: [name] [name]  Procedure(s) Performed: Procedure(s) (LRB): ATRIAL FIBRILLATION ABLATION (N/A)  Patient Location: PACU and Cath Lab  Anesthesia Type: General  Level of Consciousness: awake, alert  and oriented  Airway & Oxygen Therapy: Patient Spontanous Breathing and Patient connected to nasal cannula oxygen  Post-op Assessment: Report given to PACU RN, Post -op Vital signs reviewed and stable and Patient moving all extremities  Post vital signs: Reviewed and stable  Complications: No apparent anesthesia complications

## 2011-12-02 NOTE — Preoperative (Signed)
Beta Blockers   Reason not to administer Beta Blockers:Inderal taken 12/01/11 2230hrs

## 2011-12-02 NOTE — Op Note (Addendum)
SURGEON:  Hillis Range, MD  PREPROCEDURE DIAGNOSES: 1. Paroxysmal atrial fibrillation. 2. Typical appearing atrial flutter  POSTPROCEDURE DIAGNOSES: 1. Paroxysmal  atrial fibrillation. 2. Typical appearing atrial flutter  PROCEDURES: 1. Comprehensive electrophysiologic study. 2. Coronary sinus pacing and recording. 3. Three-dimensional mapping of atrial fibrillation with additional mapping of a discrete right atrial focus 4. Ablation of atrial fibrillation with additional ablation of a discrete right atrial focus 5. Arterial blood pressure monitoring. 6. Intracardiac echocardiography. 7. Transseptal puncture of an intact septum. 8. Rotational Angiography with processing at an independent workstation 9. Arrhythmia induction with pacing with isuprel infusion 10.Pacemaker interrogation and reprogramming  INTRODUCTION:  [name] [name] is a 71 y.o. male with a history of paroxysmal atrial fibrillation who now presents for repeat EP study and radiofrequency ablation.  The patient reports initially being diagnosed with atrial fibrillation several years ago after presenting with symptomatic palpitations and fatgiue. He failed medical therapy and underwent PVI by me 04/2008.  He did very well for several years without symptoms of arrhythmia.  He recently has developed recurrent symptomatic atrial fibrillation.   The patient therefore presents today for repeat catheter ablation of atrial fibrillation..  DESCRIPTION OF PROCEDURE:  Informed written consent was obtained, and the patient was brought to the electrophysiology lab in a fasting state.  The patient was adequately sedated with intravenous medications as outlined in the anesthesia report.  The patient's left and right groins were prepped and draped in the usual sterile fashion by the EP lab staff.  Using a percutaneous Seldinger technique, two 7-French and one 8-French hemostasis sheaths were placed into the right common femoral vein.  A 4-  [place] hemostasis sheath was placed in the right common femoral artery for blood pressure monitoring.  An 11-French hemostasis sheath was placed into the left common femoral vein.  The patients dual chamber Medtronic Enrhythm pacemaker was interrogated and found to be at [facility] battery status.  The device was programmed VVI at 40 bpm for the case today.  3 Dimensional Rotational Angiography: A 5 french pigtail catheter was introduced through the right common femoral vein and advanced into the inferior venocava.  3 demential rotational angiography was then performed by power injection of 100cc of nonionic contrast.  Reprocessing at an independent work station was then performed.   This demonstrated a moderate sized left atrium with 4 separate pulmonary veins.  The superior pulmonary veins were large.  The inferior pulmonary veins were moderate in size.  There were no anomalous veins or significant abnormalities.  A 3 dimensional rendering of the left atrium was then merged using [employer] onto the Well[employer] system and registered with intracardiac echo (see below).  The pigtail catheter was then removed.  Catheter Placement:  A 7-French Biosense Webster Decapolar coronary sinus catheter was introduced through the right common femoral vein and advanced into the coronary sinus for recording and pacing from this location.  A 6-French quadripolar Josephson catheter was introduced through the right common femoral vein and advanced into the right ventricle for recording and pacing.  This catheter was then pulled back to the His bundle location.    Initial Measurements: The patient presented to the electrophysiology lab in atrial fibrillation.  The patient was observed to have transient but frequent organization to typical appearing atrial flutter.  The atrial flutter cycle length was 300 msec with proximal to distal CS activation suggesting right atrial flutter.  This atrial flutter was too unstable  for mapping and reverted to afib  within several seconds.    His QRS  duringation measured  And his QT interval was 364 msec.  The HV interval measured 48 msec.     Intracardiac Echocardiography: A 10-French Biosense Webster AcuNav intracardiac echocardiography catheter was introduced through the left common femoral vein and advanced into the right atrium. Intracardiac echocardiography was performed of the left atrium, and a three-dimensional anatomical rendering of the left atrium was performed using CARTO sound technology.  The patient was noted to have a moderate sized left atrium.  The interatrial septum was prominent but not aneurysmal. All 4 pulmonary veins were visualized and noted to have separate ostia.  The superior pulmonary veins were large.  The inferior pulmonary veins were moderate in size.  The left atrial appendage was visualized and did not reveal thrombus.   There was no evidence of pulmonary vein stenosis.   Transseptal Puncture: The middle right common femoral vein sheath was exchanged for an 8.5 [place] SL2 transseptal sheath and transseptal access was achieved in a standard fashion using a Brockenbrough needle under biplane fluoroscopy with intracardiac echocardiography confirmation of the transseptal puncture.  Once transseptal access had been achieved, heparin was administered intravenously and intra- arterially in order to maintain an ACT of greater than 350 seconds throughout the procedure.   3D Mapping and Ablation: The His bundle catheter was removed and in its place a 3.5 mm Biosense Webster EZ [employer] ablation catheter was advanced into the right atrium.  The transseptal sheath was pulled back into the IVC over a guidewire.  The ablation catheter was advanced across the transseptal hole using the wire as a guide.  The transseptal sheath was then re-advanced over the guidewire into the left atrium.  A duodecapolar Biosense Webster circular mapping catheter was  introduced through the transseptal sheath and positioned over the mouth of all 4 pulmonary veins.  Three-dimensional electroanatomical mapping was performed using CARTO technology.  This demonstrated return of electrical activity within the left superior and right inferior pulmonary veins at baseline.  The left inferior and right superior pulmonary veins were quiescent from the prior ablation procedure.  Conduction within the left superior pulmonary vein was quite prodigious and felt to be the culprit of his recurrent afib.  The patient underwent successful sequential electrical isolation and anatomical encircling of the  left superior and right inferior pulmonary veins using radiofrequency current with a circular mapping catheter as a guide. Upon isolation of the left superior pulmonary vein, the patient converted to sinus rhythm spontaneously.  He remained in sinus rhythm thereafter.  The ablation catheter was then pulled back into the right atrial and positioned along the cavo-tricuspid isthmus.  Mapping along the atrial side of the isthmus was performed.  This demonstrated a standard isthmus.  A series of radiofrequency applications were then delivered along the isthmus.  Complete bidirectional cavotricuspid isthmus block was achieved as confirmed by differential atrial pacing from the low lateral right atrium.  A stimulus to earliest atrial activation across the isthmus measured 140 msec bi-directionally.  The patient was observe without return of conduction through the isthmus.  I did not place a Halo catheter today due to the pacemaker wires.  Measurements Following Ablation: Following ablation, Isuprel was infused up to 20 mcg/min with no inducible atrial fibrillation, atrial tachycardia, atrial flutter, or sustained PACs.  His underlying rhythm today was junction rhythm 50 bpms.  With isoprel, he was observed to have sinus rhythm at 60 bpm.  In sinus rhythm PR measured  , and QRS 87 msec.   Following ablation the AH interval measured 100 msec with an HV interval of 45 msec. Ventricular pacing was performed, which revealed midline decremental VA conduction with a VA Wenckebach cycle length of less than 590 msec.  Rapid atrial pacing was performed, which revealed an AV Wenckebach cycle length of 370 msec.  Electroisolation was then again confirmed in all four pulmonary veins. Rapid atrial pacing was continued down to a cycle length of with no arrhythmias induced.   Intracardiac echocardiography was again performed, which revealed no pericardial effusion.  The procedure was therefore considered completed.  All catheters were removed, and the sheaths were aspirated and flushed.  The patient was transferred to the recovery area for sheath removal per protocol.  A limited bedside transthoracic echocardiogram revealed no pericardial effusion.  There were no early apparent complications.  Following the procedure, the patient's pacemaker was reprogrammed DDDR with a lower rate of 60 and upper rate of 120bpm.  All lead measurements were confirmed to be stable when compared to the preprocedure measurements.  Given ERI battery status, he will require pacemaker generator change over the next few months.  CONCLUSIONS: 1. Atrial fibrillation with transient right atrial flutter upon presentation.   2. Rotational Angiography reveals a moderate sized left atrium with four separate pulmonary veins without evidence of pulmonary vein stenosis. 3. Return of electrical activity within the left superior and right inferior pulmonary veins at baseline.  The left inferior and right superior pulmonary veins were quiescent from the prior ablation procedure.  Conduction within the left superior pulmonary vein was quite prodigious and felt to be the culprit of his recurrent afib.  4. Cavo-tricuspid isthmus ablation was performed with complete bidirectional isthmus block achieved.  5. No inducible arrhythmias  following ablation both on and off of Isuprel 6. No early apparent complications. 7. ERI battery status (MDT EnRhythm pacemaker)   Loy Little,MD 11:36 AM 12/02/2011

## 2011-12-02 NOTE — Progress Notes (Signed)
Replaced old dressing to right groin with new pressure dressing.  Groin soft non-tender with no hematoma noted.  Pulses noted, VS stable.  Instructed pt to hold pressure to groins if cough and when to call RN.  Call bell in reach.  Will continue to monitor. [name] [name] Liter

## 2011-12-02 NOTE — Anesthesia Preprocedure Evaluation (Signed)
Anesthesia Evaluation  Patient identified by MRN, date of birth, ID band Patient awake    Reviewed: Allergy & Precautions, H&P , NPO status , Patient's Chart, lab work & pertinent test results  History of Anesthesia Complications (+) PONV  Airway Mallampati: II TM Distance: >3 FB Neck ROM: Full    Dental  (+) Teeth Intact   Pulmonary          Cardiovascular hypertension, + dysrhythmias Atrial Fibrillation     Neuro/Psych Anxiety Depression    GI/Hepatic   Endo/Other    Renal/GU      Musculoskeletal   Abdominal   Peds  Hematology   Anesthesia Other Findings   Reproductive/Obstetrics                           Anesthesia Physical Anesthesia Plan  ASA: III  Anesthesia Plan: General   Post-op Pain Management:    Induction: Intravenous  Airway Management Planned: LMA  Additional Equipment:   Intra-op Plan:   Post-operative Plan: Extubation in OR  Informed Consent: I have reviewed the patients History and Physical, chart, labs and discussed the procedure including the risks, benefits and alternatives for the proposed anesthesia with the patient or authorized representative who has indicated his/her understanding and acceptance.   Dental advisory given  Plan Discussed with: CRNA, Anesthesiologist and Surgeon  Anesthesia Plan Comments:         Anesthesia Quick Evaluation

## 2011-12-02 NOTE — Anesthesia Postprocedure Evaluation (Signed)
  Anesthesia Post-op Note  Patient: [name] [name]  Procedure(s) Performed: Procedure(s) (LRB): ATRIAL FIBRILLATION ABLATION (N/A)  Patient Location: Cath Lab  Anesthesia Type: General  Level of Consciousness: awake and alert   Airway and Oxygen Therapy: Patient Spontanous Breathing and Patient connected to nasal cannula oxygen  Post-op Pain: mild  Post-op Assessment: Post-op Vital signs reviewed, Patient's Cardiovascular Status Stable, Respiratory Function Stable, Patent Airway and No signs of Nausea or vomiting  Post-op Vital Signs: Reviewed and stable  Complications: No apparent anesthesia complications

## 2011-12-02 NOTE — Addendum Note (Signed)
Addendum  created 12/02/11 1654 by Kipp Brood, MD   Modules edited:Anesthesia Attestations, Notes Section

## 2011-12-02 NOTE — Progress Notes (Signed)
LEFT GROIN DRESSING SOAKED WITH BLOOD, MANUAL PRESSURE APPLIED FOR 10 MIN, DRESSING REINFORCED, NO HEMATOMA NOTED, ENDORSED.

## 2011-12-03 ENCOUNTER — Encounter (HOSPITAL_COMMUNITY): Payer: Self-pay | Admitting: Internal Medicine

## 2011-12-03 DIAGNOSIS — I4891 Unspecified atrial fibrillation: Secondary | ICD-10-CM | POA: Diagnosis not present

## 2011-12-03 LAB — BASIC METABOLIC PANEL
BUN: 18 mg/dL (ref 6–23)
CO2: 26 mEq/L (ref 19–32)
Calcium: 8.5 mg/dL (ref 8.4–10.5)
Glucose, Bld: 111 mg/dL — ABNORMAL HIGH (ref 70–99)
Sodium: 140 mEq/L (ref 135–145)

## 2011-12-03 LAB — PROTIME-INR: INR: 2.47 — ABNORMAL HIGH (ref 0.00–1.49)

## 2011-12-03 MED ORDER — ZOLPIDEM TARTRATE 5 MG PO TABS
5.0000 mg | ORAL_TABLET | Freq: Every evening | ORAL | Status: DC | PRN
  Administered 2011-12-03: 5 mg via ORAL
  Filled 2011-12-03: qty 1

## 2011-12-03 NOTE — Progress Notes (Signed)
Notified MD per pt's request for sleep medication. VS stable, new orders received. Will continue to monitor. [name] [name] [place] Liter

## 2011-12-03 NOTE — Progress Notes (Signed)
Pt. C/o minor left leg spasms.  This is not new.  After talking with pt. He agreed to try lavender lotion blend.  VS stable.  Applied blend to left knee area and gave pt instructions of when to call RN.  Pt stated after that leg felt cooler and better. Will continue to monitor. [name] [name]

## 2011-12-03 NOTE — Progress Notes (Signed)
   ELECTROPHYSIOLOGY ROUNDING NOTE    Patient Name: [name] [name] Date of Encounter: 12-03-2011    SUBJECTIVE:Patient status post repeat PVI 12-02-2011.  No chest pain or shortness of breath.    TELEMETRY: Reviewed telemetry pt in atrial pacing with intrinsic ventricular conduction Filed Vitals:   12/02/11 2000 12/02/11 2100 12/02/11 2343 12/03/11 0400  BP: 110/60 101/52 109/56 104/53  Pulse: 59 59 60 59  Temp:   98.1 F (36.7 C) 97.5 F (36.4 C)  TempSrc:   Oral Oral  Resp: 15 14 14 11   Height:      Weight:      SpO2: 100% 99% 99% 96%    Intake/Output Summary (Last 24 hours) at 12/03/11 0657 Last data filed at 12/03/11 0600  Gross per 24 hour  Intake   1176 ml  Output   1150 ml  Net     26 ml    LABS: INR: 2.47  Physical Exam: Filed Vitals:   12/02/11 2100 12/02/11 2343 12/03/11 0400 12/03/11 0730  BP: 101/52 109/56 104/53 124/67  Pulse: 59 60 59 60  Temp:  98.1 F (36.7 C) 97.5 F (36.4 C) 98.6 F (37 C)  TempSrc:  Oral Oral Oral  Resp: 14 14 11    Height:      Weight:      SpO2: 99% 99% 96% 98%    GEN- The patient is well appearing, alert and oriented x 3 today.   Head- normocephalic, atraumatic Eyes-  Sclera clear, conjunctiva pink Ears- hearing intact Oropharynx- clear Neck- supple, no JVP Lymph- no cervical lymphadenopathy Lungs- Clear to ausculation bilaterally, normal work of breathing Heart- Regular rate and rhythm, no murmurs, rubs or gallops, PMI not laterally displaced GI- soft, NT, ND, + BS Extremities- no clubbing, cyanosis, or edema, no hematoma/ bruit MS- no significant deformity or atrophy Skin- no rash or lesion Psych- euthymic mood, full affect Neuro- strength and sensation are intact  DEVICE INTERROGATION: Device interrogated yesterday.  Pt with Medtronic EnRhythm pacemaker at ERI.  Reprogrammed to DDDR 60/120 with PAV and SAV at .     A/P:   1. Doing well s/p PVI without afib recurrent DC to home Stop multaq but  continue other medicines He will need an INR check on Friday Continue PPI  Wound care, restrictions reviewed with patient  Follow up appointment scheduled in 3 months.  Patient and Dr Ladona Ridgel aware of pacemaker battery status. I would anticipate pacemaker generator change in 3 months.  Fayrene Fearing Nihira Puello,MD

## 2011-12-03 NOTE — Progress Notes (Signed)
DISCHARGED HOME ACCOMPANIED BY WIFE, D/C INSTRUCTIONS GIVEN TO PT. STABLE. BELONGINGS WITH WIFE.

## 2011-12-04 NOTE — Discharge Summary (Signed)
ELECTROPHYSIOLOGY PROCEDURE DISCHARGE SUMMARY    Patient ID: [name] [name],  MRN: [id number], DOB/AGE: [birth year] 73 y.o.  Admit date: 12/02/2011 Discharge date: 12/04/2011  Primary Care Physician: Blair Heys, MD Primary Cardiologist: Lewayne Bunting, MD  Primary Discharge Diagnosis:  Atrial fibrillation status post electrophysiology study and radiofrequency catheter ablation this admission  Secondary Discharge Diagnosis:  1.  Hypertension 2.  Hyperlipidemia 3.  Arthritis of the knee 4.  Sick sinus syndrome- status post Medtronic pacemaker implantation 2007- device found to be at elective replacement interval this admission.  Procedures This Admission:  1.  Electrophysiology study and radiofrequency catheter ablation on 12-02-2011 by Dr Johney Frame.  This study demonstrated    A. Atrial fibrillation with transient right atrial flutter upon presentation.   B. Rotational Angiography reveals a moderate sized left atrium with four   separate pulmonary veins without evidence of pulmonary vein stenosis.   C. Return of electrical activity within the left superior and right inferior pulmonary  veins at baseline. The left inferior and right superior pulmonary veins were  quiescent from the prior ablation procedure. Conduction within the left superior  pulmonary vein was quite prodigious and felt to be the culprit of his recurrent  afib.   D. Cavo-tricuspid isthmus ablation was performed with complete bidirectional  isthmus block achieved.   E. No inducible arrhythmias following ablation both on and off of Isuprel   F. No early apparent complications.   Reece Agar ERI battery status (MDT EnRhythm pacemaker)   Brief HPI: [name] is a 71 year old male with a history of sick sinus syndrome and atrial fibrillation.  He underwent PVI several years ago and initially did very well. Recently he has had difficulty with DJD and chronic knee pain requiring several surgical procedures. In the midst of this, he  has developed recurrent symptomatic atrial fibrillation. He reports symptoms of fatigue and decreased exercise tolerance in afib. Pacemaker interrogation has reviewed afib 30% of the time. He is adequately anticoagulated chronically. He continues to have afib despite medical therapy with multaq.  Risks, benefits, and alternatives to repeat catheter ablation were reviewed with the patient who wished to proceed.  Hospital Course:  The patient was admitted and underwent EPS and RFCA with details as outlined above.  He was monitored on telemetry overnight which demonstrated atrial pacing with intrinsic ventricular conduction.  His INR was low on admission and he was covered with Lovenox until he was therapeutic. His pacemaker was found to be at elective replacement indicator and was reprogrammed to DDDR 60/120.  Device change out will be discussed with Dr Ladona Ridgel in several months.  His groin incisions were without hematoma or bruit.  Dr Johney Frame examined the patient and considered him stable for discharge to home with early INR follow up.   Discharge Vitals: Blood pressure 124/67, pulse 60, temperature 98.6 F (37 C), temperature source Oral, resp. rate 11, height 5' 7.5" (1.715 m), weight 185 lb (83.915 kg), SpO2 98.00%.    Labs:   Lab Results  Component Value Date   WBC 6.0 11/25/2011   HGB 13.2 11/25/2011   HCT 39.7 11/25/2011   MCV 87.5 11/25/2011   PLT 243.0 11/25/2011    Lab 12/03/11 0537  NA 140  K 4.0  CL 106  CO2 26  BUN 18  CREATININE 1.02  CALCIUM 8.5  PROT --  BILITOT --  ALKPHOS --  ALT --  AST --  GLUCOSE 111*   INR - 2.47 on day of  discharge  Discharge Medications:  Medication List  As of 12/04/2011  3:48 PM   STOP taking these medications         dronedarone 400 MG tablet         TAKE these medications         ALIGN 4 MG Caps   Take 1 capsule by mouth daily.      ALPRAZolam 0.5 MG tablet   Commonly known as: XANAX   Take 0.5 mg by mouth 2 (two) times daily.       diclofenac sodium 1 % Gel   Commonly known as: VOLTAREN   Apply 1 application topically 3 (three) times daily.      ferrous sulfate 325 (65 FE) MG tablet   Take 325 mg by mouth daily with breakfast.      FLUoxetine 40 MG capsule   Commonly known as: PROZAC   Take 40 mg by mouth daily.      pantoprazole 40 MG tablet   Commonly known as: PROTONIX   Take 40 mg by mouth daily.      potassium chloride SA 20 MEQ tablet   Commonly known as: K-DUR,KLOR-CON   Take 1 tablet (20 mEq total) by mouth daily.      propranolol 20 MG tablet   Commonly known as: INDERAL   Take 20 mg by mouth 3 (three) times daily as needed.      warfarin 5 MG tablet   Commonly known as: COUMADIN   Take 2.5-5 mg by mouth daily. Takes 5 mg on sun, thur Takes 2.5 mg the rest of the week            Disposition:  Discharge Orders    Future Appointments: Provider: Department: Dept Phone: Center:   12/05/2011 1:00 PM [facility] [phone] None   03/04/2012 1:45 PM Hillis Range, MD [facility] [phone] [facility]     Future Orders Please Complete By Expires   Diet - low sodium heart healthy      Increase activity slowly      Comments:   No driving for 2 days. No lifting over 5 lbs for 1 week. No sexual activity for 1 week. Keep procedure site clean & dry. If you notice increased pain, swelling, bleeding or pus, call/return!  You may shower, but no soaking baths/hot tubs/pools for 1 week.     Follow-up Information    Follow up with [facility]. ([facility] 12/05/11 at 1pm)    Contact information:   [address] [place] [address] [phone]      Follow up with Hillis Range, MD. (03/04/12 1:45pm)    Contact information:   [address], [address] [place] [address] [phone]          Duration of Discharge Encounter: Greater than 30 minutes including physician time.  Signed, Gypsy Balsam,  RN, BSN 12/04/2011, 3:48 PM

## 2011-12-05 ENCOUNTER — Ambulatory Visit (INDEPENDENT_AMBULATORY_CARE_PROVIDER_SITE_OTHER): Payer: Medicare Other | Admitting: *Deleted

## 2011-12-05 DIAGNOSIS — I4891 Unspecified atrial fibrillation: Secondary | ICD-10-CM | POA: Diagnosis not present

## 2011-12-05 DIAGNOSIS — Z7901 Long term (current) use of anticoagulants: Secondary | ICD-10-CM

## 2011-12-05 LAB — POCT INR: INR: 2.6

## 2011-12-06 ENCOUNTER — Telehealth: Payer: Self-pay | Admitting: Physician Assistant

## 2011-12-06 NOTE — Telephone Encounter (Signed)
[name] [name] underwent an atrial fibrillation ablation last week. He had some bruising when he left the hospital. He notes that the ecchymosis has spread down his leg. It is worse on the right than the left. There is some puffiness noted in his right groin. He denies any pain. He denies any unusual back discomfort. He denies lightheadedness or weakness. He denies syncope or near-syncope. He otherwise feels well.  No fevers or chills.  Access for this procedure was gained through the right and left common femoral veins. He is also on warfarin. I suspect that this is normal bruising.   I have recommended that he continue to watch the area. If he feels uncomfortable with this or he notices any worsening swelling, bruising or pain, he should go to the emergency room/urgent care for evaluation this weekend. Otherwise I will try to get him seen in the office on Monday for evaluation.  He agreed with this plan. Tereso Newcomer, PA-C  11:15 AM 12/06/2011

## 2011-12-08 ENCOUNTER — Telehealth: Payer: Self-pay | Admitting: Physician Assistant

## 2011-12-08 NOTE — Telephone Encounter (Signed)
[name] [name], [place]  8:34 AM 12/08/2011

## 2011-12-08 NOTE — Telephone Encounter (Signed)
FYI:  Called to schedule visit w/ [name] [name]. Per after hours message post ablation bruising.  Patient does not feel that he needs to come in today, he is fine.

## 2011-12-19 ENCOUNTER — Other Ambulatory Visit: Payer: Self-pay | Admitting: Internal Medicine

## 2011-12-19 ENCOUNTER — Ambulatory Visit (INDEPENDENT_AMBULATORY_CARE_PROVIDER_SITE_OTHER): Payer: Medicare Other | Admitting: Pharmacist

## 2011-12-19 DIAGNOSIS — I4891 Unspecified atrial fibrillation: Secondary | ICD-10-CM | POA: Diagnosis not present

## 2011-12-19 DIAGNOSIS — Z7901 Long term (current) use of anticoagulants: Secondary | ICD-10-CM

## 2011-12-19 LAB — POCT INR: INR: 1.3

## 2011-12-26 ENCOUNTER — Ambulatory Visit (INDEPENDENT_AMBULATORY_CARE_PROVIDER_SITE_OTHER): Payer: Medicare Other

## 2011-12-26 DIAGNOSIS — Z7901 Long term (current) use of anticoagulants: Secondary | ICD-10-CM | POA: Diagnosis not present

## 2011-12-26 DIAGNOSIS — I4891 Unspecified atrial fibrillation: Secondary | ICD-10-CM

## 2012-01-01 ENCOUNTER — Encounter: Payer: Self-pay | Admitting: Internal Medicine

## 2012-01-05 ENCOUNTER — Telehealth: Payer: Self-pay | Admitting: *Deleted

## 2012-01-05 NOTE — Telephone Encounter (Signed)
Received remote transmission. Device at [facility] since 11-07-2011.  Left message on machine for patient to call to schedule appt with Rick Duff, PA to discuss generator change.

## 2012-01-05 NOTE — Telephone Encounter (Signed)
Spoke with patient.  He was aware of ERI status at time of afib ablation in July with Dr Johney Frame with plans to wait until October for change out.  He has an appt with Dr Johney Frame October 10th.  We will discuss change out then.  Pt is okay with [facility] doing generator change instead of Dr Ladona Ridgel.

## 2012-01-06 ENCOUNTER — Telehealth: Payer: Self-pay | Admitting: Internal Medicine

## 2012-01-06 NOTE — Telephone Encounter (Signed)
New Problem:    Patient called back wanting Dr. Johney Frame to know how pleased he is with the ablation that he performed 6 weeks ago.  Please call back if you have any questions.

## 2012-01-22 DIAGNOSIS — L570 Actinic keratosis: Secondary | ICD-10-CM | POA: Diagnosis not present

## 2012-01-22 DIAGNOSIS — L219 Seborrheic dermatitis, unspecified: Secondary | ICD-10-CM | POA: Diagnosis not present

## 2012-01-22 DIAGNOSIS — Z85828 Personal history of other malignant neoplasm of skin: Secondary | ICD-10-CM | POA: Diagnosis not present

## 2012-01-22 DIAGNOSIS — D235 Other benign neoplasm of skin of trunk: Secondary | ICD-10-CM | POA: Diagnosis not present

## 2012-01-23 ENCOUNTER — Ambulatory Visit (INDEPENDENT_AMBULATORY_CARE_PROVIDER_SITE_OTHER): Payer: Medicare Other

## 2012-01-23 DIAGNOSIS — I4891 Unspecified atrial fibrillation: Secondary | ICD-10-CM | POA: Diagnosis not present

## 2012-01-23 DIAGNOSIS — Z7901 Long term (current) use of anticoagulants: Secondary | ICD-10-CM

## 2012-01-30 DIAGNOSIS — E7800 Pure hypercholesterolemia, unspecified: Secondary | ICD-10-CM | POA: Diagnosis not present

## 2012-01-30 DIAGNOSIS — K219 Gastro-esophageal reflux disease without esophagitis: Secondary | ICD-10-CM | POA: Diagnosis not present

## 2012-01-30 DIAGNOSIS — Z23 Encounter for immunization: Secondary | ICD-10-CM | POA: Diagnosis not present

## 2012-01-30 DIAGNOSIS — I4891 Unspecified atrial fibrillation: Secondary | ICD-10-CM | POA: Diagnosis not present

## 2012-01-30 DIAGNOSIS — H9319 Tinnitus, unspecified ear: Secondary | ICD-10-CM | POA: Diagnosis not present

## 2012-01-30 DIAGNOSIS — Z131 Encounter for screening for diabetes mellitus: Secondary | ICD-10-CM | POA: Diagnosis not present

## 2012-01-30 DIAGNOSIS — M1990 Unspecified osteoarthritis, unspecified site: Secondary | ICD-10-CM | POA: Diagnosis not present

## 2012-01-30 DIAGNOSIS — G43009 Migraine without aura, not intractable, without status migrainosus: Secondary | ICD-10-CM | POA: Diagnosis not present

## 2012-01-30 DIAGNOSIS — Z125 Encounter for screening for malignant neoplasm of prostate: Secondary | ICD-10-CM | POA: Diagnosis not present

## 2012-01-30 NOTE — ED Provider Notes (Signed)
Medical screening examination/treatment/procedure(s) were performed by non-physician practitioner and as supervising physician Dr. Hyman Hopes was immediately available for consultation/collaboration.   Hurman Horn, MD 01/30/12 2203

## 2012-02-17 DIAGNOSIS — M47812 Spondylosis without myelopathy or radiculopathy, cervical region: Secondary | ICD-10-CM | POA: Diagnosis not present

## 2012-02-20 ENCOUNTER — Ambulatory Visit (INDEPENDENT_AMBULATORY_CARE_PROVIDER_SITE_OTHER): Payer: Medicare Other

## 2012-02-20 DIAGNOSIS — Z7901 Long term (current) use of anticoagulants: Secondary | ICD-10-CM

## 2012-02-20 DIAGNOSIS — I4891 Unspecified atrial fibrillation: Secondary | ICD-10-CM

## 2012-02-24 ENCOUNTER — Encounter: Payer: Self-pay | Admitting: *Deleted

## 2012-02-25 DIAGNOSIS — M47812 Spondylosis without myelopathy or radiculopathy, cervical region: Secondary | ICD-10-CM | POA: Diagnosis not present

## 2012-02-25 DIAGNOSIS — M542 Cervicalgia: Secondary | ICD-10-CM | POA: Diagnosis not present

## 2012-03-01 ENCOUNTER — Encounter: Payer: Self-pay | Admitting: Internal Medicine

## 2012-03-01 ENCOUNTER — Encounter: Payer: Self-pay | Admitting: *Deleted

## 2012-03-01 ENCOUNTER — Ambulatory Visit (INDEPENDENT_AMBULATORY_CARE_PROVIDER_SITE_OTHER): Payer: Medicare Other | Admitting: Internal Medicine

## 2012-03-01 VITALS — BP 126/84 | HR 84 | Ht 67.5 in | Wt 191.4 lb

## 2012-03-01 DIAGNOSIS — I4891 Unspecified atrial fibrillation: Secondary | ICD-10-CM

## 2012-03-01 DIAGNOSIS — Z45018 Encounter for adjustment and management of other part of cardiac pacemaker: Secondary | ICD-10-CM

## 2012-03-01 DIAGNOSIS — F429 Obsessive-compulsive disorder, unspecified: Secondary | ICD-10-CM | POA: Diagnosis not present

## 2012-03-01 LAB — CBC WITH DIFFERENTIAL/PLATELET
Basophils Absolute: 0 10*3/uL (ref 0.0–0.1)
Eosinophils Absolute: 0.3 10*3/uL (ref 0.0–0.7)
Hemoglobin: 12.8 g/dL — ABNORMAL LOW (ref 13.0–17.0)
Lymphocytes Relative: 32.4 % (ref 12.0–46.0)
Lymphs Abs: 1.8 10*3/uL (ref 0.7–4.0)
MCHC: 33.4 g/dL (ref 30.0–36.0)
MCV: 90.3 fl (ref 78.0–100.0)
Monocytes Absolute: 0.4 10*3/uL (ref 0.1–1.0)
Neutro Abs: 2.9 10*3/uL (ref 1.4–7.7)
RDW: 13.2 % (ref 11.5–14.6)

## 2012-03-01 LAB — BASIC METABOLIC PANEL
CO2: 30 mEq/L (ref 19–32)
Calcium: 8.9 mg/dL (ref 8.4–10.5)
Chloride: 103 mEq/L (ref 96–112)
Glucose, Bld: 85 mg/dL (ref 70–99)
Sodium: 139 mEq/L (ref 135–145)

## 2012-03-01 NOTE — Patient Instructions (Addendum)
See instruction sheet for generator change  Your physician has recommended you make the following change in your medication:  1) Stop Propanolol

## 2012-03-01 NOTE — Progress Notes (Signed)
  PCP:  EHINGER,ROBERT R, MD Primary EP:  Dr Taylor  The patient presents today for routine electrophysiology followup.  Since his recent afib ablation, the patient reports doing very well.  He denies procedure related complications.  He is maintaining sinus rhythm.  Today, he denies symptoms of palpitations, chest pain, shortness of breath, orthopnea, PND, lower extremity edema, dizziness, presyncope, syncope, or neurologic sequela.  The patient feels that he is tolerating medications without difficulties and is otherwise without complaint today.   Past Medical History  Diagnosis Date  . Paroxysmal atrial fibrillation   . HTN (hypertension)   . Hyperlipidemia   . Migraines   . Arthritis of knee, right     end-stage  . Bursitis   . Gout   . Tinnitus   . Depression   . Bronchitis   . Heart murmur   . Anemia     since post op- knee replacement   . Complication of anesthesia     irritation in throat postop, makes a-fib worse   . DJD (degenerative joint disease)     OA- "everywhere"  . Anxiety     takes xanax 2 times per day, taken mostly for ringing in ears    . PONV (postoperative nausea and vomiting)   . Family history of anesthesia complication     sister with nausia and vomiting   Past Surgical History  Procedure Date  . Transesophageal echocardiogram 04/2008  . Transthoracic echocardiogram 2006,2009  . Bilteral thumb joint surgrery   . Right rotator cuff surgery 2006  . Cholecystectomy   . Rhinoplasty   . Uvulectomy   . Appendectomy   . Pacemaker insertion 06/06/2005    Medtronic EnRhythm dual-chamber. For sick sinus syndrome  . Right total hip arthroplasty   . Left rotator cuff repair   . Cardiac catheterization 2005     which revealed 40% stenosis of the mid left anterior descending artery., ablation- 2009  . Tonsillectomy     as a child  . Joint replacement     L knee- 12/11,R hip- 1996  . Total knee revision 08/08/2011    Procedure: TOTAL KNEE REVISION;   Surgeon: Frank J Rowan, MD;  Location: [facility] OR;  Service: Orthopedics;  Laterality: Left;  DEPUY/SIGMA  . Atrial fibrillation ablation 2009, 12/02/11    PVI by Dr Burle Kwan x 2  . Tee without cardioversion 12/01/2011    Procedure: TRANSESOPHAGEAL ECHOCARDIOGRAM (TEE);  Surgeon: Paula V Ross, MD;  Location: [facility] ENDOSCOPY;  Service: Cardiovascular;  Laterality: N/A;  a-fib ablation following day    Current Outpatient Prescriptions  Medication Sig Dispense Refill  . ALPRAZolam (XANAX) 0.5 MG tablet Take 0.5 mg by mouth 2 (two) times daily.       . diclofenac sodium (VOLTAREN) 1 % GEL Apply 1 application topically 3 (three) times daily.      . ferrous sulfate 325 (65 FE) MG tablet Take 325 mg by mouth daily with breakfast.       . FLUoxetine (PROZAC) 40 MG capsule Take 40 mg by mouth daily.       . pantoprazole (PROTONIX) 40 MG tablet Take 40 mg by mouth daily.      . potassium chloride SA (K-DUR,KLOR-CON) 20 MEQ tablet Take 1 tablet (20 mEq total) by mouth daily.  30 tablet  6  . Probiotic Product (ALIGN) 4 MG CAPS Take 1 capsule by mouth daily.        . warfarin (COUMADIN) 5 MG tablet Take 2.5-5   mg by mouth daily. Takes 5 mg on sun, thur Takes 2.5 mg the rest of the week      . warfarin (COUMADIN) 5 MG tablet TAKE AS DIRECTED BY  ANTICOAGULATION  CLINIC  30 tablet  3    Allergies  Allergen Reactions  . Iohexol      Code: HIVES, Desc: POST MYELOGRAM.  PT ALSO SAID THIS HAPPENED A YEAR AGO WITH A MYELOGRAM AND WITH ESI'S., Onset Date: [id number]   . Meperidine Hcl     REACTION: unspecified  . Prednisone     Redness of skin & jittery    History   Social History  . Marital Status: Married    Spouse Name: N/A    Number of Children: 2  . Years of Education: N/A   Occupational History  . retired    Social History Main Topics  . Smoking status: Never Smoker   . Smokeless tobacco: Never Used  . Alcohol Use: Yes     rare use  . Drug Use: No  . Sexually Active: Not on file   Other Topics  Concern  . Not on file   Social History Narrative  . No narrative on file    Family History  Problem Relation Age of Onset  . Coronary artery disease Mother   . Other Mother     Cerebrovascular Disease  . Cancer    . Anesthesia problems Neg Hx   . Hypotension Neg Hx   . Malignant hyperthermia Neg Hx   . Pseudochol deficiency Neg Hx     ROS-  All systems are reviewed and are negative except as outlined in the HPI above  Physical Exam: Filed Vitals:   03/01/12 1353  BP: 126/84  Pulse: 84  Height: 5' 7.5" (1.715 m)  Weight: 191 lb 6.4 oz (86.818 kg)  SpO2: 97%    GEN- The patient is well appearing, alert and oriented x 3 today.   Head- normocephalic, atraumatic Eyes-  Sclera clear, conjunctiva pink Ears- hearing intact Oropharynx- clear Neck- supple, no JVP Lymph- no cervical lymphadenopathy Lungs- Clear to ausculation bilaterally, normal work of breathing Chest- pacemaker pocket is well healed Heart- Regular rate and rhythm, no murmurs, rubs or gallops, PMI not laterally displaced GI- soft, NT, ND, + BS Extremities- no clubbing, cyanosis, or edema MS- no significant deformity or atrophy Skin- no rash or lesion Psych- euthymic mood, full affect Neuro- strength and sensation are intact  Pacemaker interrogation- reviewed in detail today,  See PACEART report  Assessment and Plan:  1, Bradycardia He has reached ERI battery status See Pace Art report Risks, benefits, and alternatives to PPM pulse generator replacement were discussed at length with the patient today who wishes to proceed.  We will schedule pulse generator replacement with Dr Taylor (his primary electrophysiologist) at the next available time.  2. Afib Doing very well, without recurrence of afib post ablation Stop beta blocker today Continue coumadin CHADS2 score is 1.  He may be able to eventually stop coumadin if he does not have recurrence of afib.    

## 2012-03-02 ENCOUNTER — Encounter (HOSPITAL_COMMUNITY): Payer: Self-pay | Admitting: Pharmacy Technician

## 2012-03-03 DIAGNOSIS — M542 Cervicalgia: Secondary | ICD-10-CM | POA: Diagnosis not present

## 2012-03-03 DIAGNOSIS — M47812 Spondylosis without myelopathy or radiculopathy, cervical region: Secondary | ICD-10-CM | POA: Diagnosis not present

## 2012-03-04 ENCOUNTER — Encounter: Payer: Medicare Other | Admitting: Internal Medicine

## 2012-03-05 DIAGNOSIS — M542 Cervicalgia: Secondary | ICD-10-CM | POA: Diagnosis not present

## 2012-03-05 DIAGNOSIS — M47812 Spondylosis without myelopathy or radiculopathy, cervical region: Secondary | ICD-10-CM | POA: Diagnosis not present

## 2012-03-10 ENCOUNTER — Encounter: Payer: Self-pay | Admitting: Internal Medicine

## 2012-03-10 DIAGNOSIS — M47812 Spondylosis without myelopathy or radiculopathy, cervical region: Secondary | ICD-10-CM | POA: Diagnosis not present

## 2012-03-10 DIAGNOSIS — M542 Cervicalgia: Secondary | ICD-10-CM | POA: Diagnosis not present

## 2012-03-11 DIAGNOSIS — Z79899 Other long term (current) drug therapy: Secondary | ICD-10-CM | POA: Diagnosis not present

## 2012-03-11 DIAGNOSIS — Z96659 Presence of unspecified artificial knee joint: Secondary | ICD-10-CM | POA: Diagnosis not present

## 2012-03-11 DIAGNOSIS — I4891 Unspecified atrial fibrillation: Secondary | ICD-10-CM | POA: Diagnosis not present

## 2012-03-11 DIAGNOSIS — M109 Gout, unspecified: Secondary | ICD-10-CM | POA: Diagnosis not present

## 2012-03-11 DIAGNOSIS — D649 Anemia, unspecified: Secondary | ICD-10-CM | POA: Diagnosis not present

## 2012-03-11 DIAGNOSIS — M1990 Unspecified osteoarthritis, unspecified site: Secondary | ICD-10-CM | POA: Diagnosis not present

## 2012-03-11 DIAGNOSIS — F411 Generalized anxiety disorder: Secondary | ICD-10-CM | POA: Diagnosis not present

## 2012-03-11 DIAGNOSIS — G43909 Migraine, unspecified, not intractable, without status migrainosus: Secondary | ICD-10-CM | POA: Diagnosis not present

## 2012-03-11 DIAGNOSIS — Z45018 Encounter for adjustment and management of other part of cardiac pacemaker: Secondary | ICD-10-CM | POA: Diagnosis not present

## 2012-03-11 DIAGNOSIS — M1710 Unilateral primary osteoarthritis, unspecified knee: Secondary | ICD-10-CM | POA: Diagnosis not present

## 2012-03-11 DIAGNOSIS — Z96649 Presence of unspecified artificial hip joint: Secondary | ICD-10-CM | POA: Diagnosis not present

## 2012-03-11 DIAGNOSIS — Z7901 Long term (current) use of anticoagulants: Secondary | ICD-10-CM | POA: Diagnosis not present

## 2012-03-11 DIAGNOSIS — E785 Hyperlipidemia, unspecified: Secondary | ICD-10-CM | POA: Diagnosis not present

## 2012-03-11 DIAGNOSIS — I10 Essential (primary) hypertension: Secondary | ICD-10-CM | POA: Diagnosis not present

## 2012-03-11 MED ORDER — SODIUM CHLORIDE 0.9 % IR SOLN
80.0000 mg | Status: DC
  Filled 2012-03-11: qty 2

## 2012-03-11 MED ORDER — CEFAZOLIN SODIUM-DEXTROSE 2-3 GM-% IV SOLR
2.0000 g | INTRAVENOUS | Status: DC
  Filled 2012-03-11 (×2): qty 50

## 2012-03-12 ENCOUNTER — Ambulatory Visit (HOSPITAL_COMMUNITY)
Admission: RE | Admit: 2012-03-12 | Discharge: 2012-03-12 | Disposition: A | Discharge status: Home / Self Care | Payer: Medicare Other | Source: Ambulatory Visit | Attending: Internal Medicine | Admitting: Internal Medicine

## 2012-03-12 ENCOUNTER — Encounter (HOSPITAL_COMMUNITY)
Admission: RE | Disposition: A | Discharge status: Home / Self Care | Payer: Self-pay | Source: Ambulatory Visit | Attending: Internal Medicine

## 2012-03-12 DIAGNOSIS — Z7901 Long term (current) use of anticoagulants: Secondary | ICD-10-CM | POA: Insufficient documentation

## 2012-03-12 DIAGNOSIS — M109 Gout, unspecified: Secondary | ICD-10-CM | POA: Insufficient documentation

## 2012-03-12 DIAGNOSIS — M1710 Unilateral primary osteoarthritis, unspecified knee: Secondary | ICD-10-CM | POA: Insufficient documentation

## 2012-03-12 DIAGNOSIS — M1990 Unspecified osteoarthritis, unspecified site: Secondary | ICD-10-CM | POA: Insufficient documentation

## 2012-03-12 DIAGNOSIS — Z96649 Presence of unspecified artificial hip joint: Secondary | ICD-10-CM | POA: Insufficient documentation

## 2012-03-12 DIAGNOSIS — F411 Generalized anxiety disorder: Secondary | ICD-10-CM | POA: Insufficient documentation

## 2012-03-12 DIAGNOSIS — D649 Anemia, unspecified: Secondary | ICD-10-CM | POA: Insufficient documentation

## 2012-03-12 DIAGNOSIS — I4891 Unspecified atrial fibrillation: Secondary | ICD-10-CM | POA: Insufficient documentation

## 2012-03-12 DIAGNOSIS — E785 Hyperlipidemia, unspecified: Secondary | ICD-10-CM | POA: Insufficient documentation

## 2012-03-12 DIAGNOSIS — I498 Other specified cardiac arrhythmias: Secondary | ICD-10-CM | POA: Diagnosis not present

## 2012-03-12 DIAGNOSIS — Z96659 Presence of unspecified artificial knee joint: Secondary | ICD-10-CM | POA: Insufficient documentation

## 2012-03-12 DIAGNOSIS — I10 Essential (primary) hypertension: Secondary | ICD-10-CM | POA: Insufficient documentation

## 2012-03-12 DIAGNOSIS — Z45018 Encounter for adjustment and management of other part of cardiac pacemaker: Secondary | ICD-10-CM | POA: Insufficient documentation

## 2012-03-12 DIAGNOSIS — G43909 Migraine, unspecified, not intractable, without status migrainosus: Secondary | ICD-10-CM | POA: Insufficient documentation

## 2012-03-12 DIAGNOSIS — Z79899 Other long term (current) drug therapy: Secondary | ICD-10-CM | POA: Insufficient documentation

## 2012-03-12 HISTORY — PX: PERMANENT PACEMAKER GENERATOR CHANGE: SHX6022

## 2012-03-12 LAB — SURGICAL PCR SCREEN
MRSA, PCR: NEGATIVE
Staphylococcus aureus: NEGATIVE

## 2012-03-12 SURGERY — PERMANENT PACEMAKER GENERATOR CHANGE
Anesthesia: LOCAL

## 2012-03-12 MED ORDER — MUPIROCIN 2 % EX OINT
TOPICAL_OINTMENT | CUTANEOUS | Status: AC
  Filled 2012-03-12: qty 22

## 2012-03-12 MED ORDER — ONDANSETRON HCL 4 MG/2ML IJ SOLN: 4.0000 mg | Freq: Four times a day (QID) | INTRAMUSCULAR | Status: DC | PRN

## 2012-03-12 MED ORDER — SODIUM CHLORIDE 0.45 % IV SOLN
INTRAVENOUS | Status: DC
  Administered 2012-03-12: 06:00:00 via INTRAVENOUS

## 2012-03-12 MED ORDER — FENTANYL CITRATE 0.05 MG/ML IJ SOLN
INTRAMUSCULAR | Status: AC
  Filled 2012-03-12: qty 2

## 2012-03-12 MED ORDER — MUPIROCIN 2 % EX OINT
TOPICAL_OINTMENT | CUTANEOUS | Status: AC
  Administered 2012-03-12: 1 via NASAL
  Filled 2012-03-12: qty 22

## 2012-03-12 MED ORDER — ACETAMINOPHEN 325 MG PO TABS: 325.0000 mg | ORAL_TABLET | ORAL | Status: DC | PRN

## 2012-03-12 MED ORDER — MIDAZOLAM HCL 5 MG/5ML IJ SOLN
INTRAMUSCULAR | Status: AC
  Filled 2012-03-12: qty 5

## 2012-03-12 MED ORDER — MUPIROCIN 2 % EX OINT: TOPICAL_OINTMENT | Freq: Two times a day (BID) | CUTANEOUS | Status: DC

## 2012-03-12 MED ORDER — LIDOCAINE HCL (PF) 1 % IJ SOLN
INTRAMUSCULAR | Status: AC
  Filled 2012-03-12: qty 60

## 2012-03-12 NOTE — Op Note (Signed)
[name] [name], [name] [name]               ACCOUNT NO.:  [id number]  MEDICAL RECORD NO.:  [id number]  LOCATION:  [facility]                         FACILITY:  [facility]  PHYSICIAN:  Doylene Canning. Ladona Ridgel, MD    DATE OF BIRTH:  1941/03/02  DATE OF PROCEDURE:  03/12/2012 DATE OF DISCHARGE:  03/12/2012                              OPERATIVE REPORT   PROCEDURE PERFORMED:  Removal of a previously implanted dual-chamber pacemaker followed by pacemaker pocket revision followed by insertion of a new dual-chamber pacemaker.  INTRODUCTION:  The patient is a 71 year old male with symptomatic bradycardia, status post pacemaker insertion.  He reached elective replacement indication on his pacemaker.  He is now referred for removal and insertion of a new device.  PROCEDURE:  After informed consent was obtained, the patient was taken to the Diagnostic EP Lab in the fasting state.  After usual preparation and draping, intravenous fentanyl and midazolam was given for sedation. A 30 mL of lidocaine was infiltrated into the left infraclavicular region.  A 5-cm incision was carried out over this region, and electrocautery was utilized to dissect down to the fascial plane.  The pacemaker pocket was entered with electrocautery and the generator was removed with gentle traction.  The pocket was irrigated and revised to accommodate the larger device.  This was carried out with electrocautery.  At this point, the leads were evaluated.  The P-waves were 3, the R-waves were 11, the impedance 400 in the atrium and 500 in the ventricle.  The threshold was 0.3 at 0.5 in the atrium and less than 0.2 at 0.5 in the right ventricle.  With these satisfactory parameters, the new Medtronic Adapta L dual-chamber pacemaker, serial number [id number] was connected to the atrial and the ventricular leads and placed back in the subcutaneous pocket.  The pocket was irrigated with additional antibiotic, and incision was closed with 2-0 and 3-0  Vicryl. Benzoin and Steri-Strips were painted on the skin, pressure was held, and the patient was returned to his room in satisfactory condition.  COMPLICATIONS:  There were no immediate procedure complications.  RESULTS:  This demonstrates successful removal of a previously implanted Medtronic dual-chamber pacemaker followed by pacemaker pocket revision to accommodate the larger footprint of the new device followed by insertion of a new dual-chamber pacemaker.     Doylene Canning. Ladona Ridgel, MD     GWT/MEDQ  D:  03/12/2012  T:  03/12/2012  Job:  [id number]

## 2012-03-12 NOTE — Op Note (Signed)
DDD PPM removal, PPM pocket revision and insertion of a new dual chamber PPM without immediate complication. R#[id number].

## 2012-03-12 NOTE — H&P (View-Only) (Signed)
PCP:  Thora Lance, MD Primary EP:  Dr Ladona Ridgel  The patient presents today for routine electrophysiology followup.  Since his recent afib ablation, the patient reports doing very well.  He denies procedure related complications.  He is maintaining sinus rhythm.  Today, he denies symptoms of palpitations, chest pain, shortness of breath, orthopnea, PND, lower extremity edema, dizziness, presyncope, syncope, or neurologic sequela.  The patient feels that he is tolerating medications without difficulties and is otherwise without complaint today.   Past Medical History  Diagnosis Date  . Paroxysmal atrial fibrillation   . HTN (hypertension)   . Hyperlipidemia   . Migraines   . Arthritis of knee, right     end-stage  . Bursitis   . Gout   . Tinnitus   . Depression   . Bronchitis   . Heart murmur   . Anemia     since post op- knee replacement   . Complication of anesthesia     irritation in throat postop, makes a-fib worse   . DJD (degenerative joint disease)     OA- "everywhere"  . Anxiety     takes xanax 2 times per day, taken mostly for ringing in ears    . PONV (postoperative nausea and vomiting)   . Family history of anesthesia complication     sister with nausia and vomiting   Past Surgical History  Procedure Date  . Transesophageal echocardiogram 04/2008  . Transthoracic echocardiogram 2006,2009  . Bilteral thumb joint surgrery   . Right rotator cuff surgery 2006  . Cholecystectomy   . Rhinoplasty   . Uvulectomy   . Appendectomy   . Pacemaker insertion 06/06/2005    Medtronic EnRhythm dual-chamber. For sick sinus syndrome  . Right total hip arthroplasty   . Left rotator cuff repair   . Cardiac catheterization 2005     which revealed 40% stenosis of the mid left anterior descending artery., ablation- 2009  . Tonsillectomy     as a child  . Joint replacement     L knee- 12/11,R hip- 1996  . Total knee revision 08/08/2011    Procedure: TOTAL KNEE REVISION;   Surgeon: Nestor Lewandowsky, MD;  Location: [facility] OR;  Service: Orthopedics;  Laterality: Left;  DEPUY/SIGMA  . Atrial fibrillation ablation 2009, 12/02/11    PVI by Dr Johney Frame x 2  . Tee without cardioversion 12/01/2011    Procedure: TRANSESOPHAGEAL ECHOCARDIOGRAM (TEE);  Surgeon: Pricilla Riffle, MD;  Location: [facility] ENDOSCOPY;  Service: Cardiovascular;  Laterality: N/A;  a-fib ablation following day    Current Outpatient Prescriptions  Medication Sig Dispense Refill  . ALPRAZolam (XANAX) 0.5 MG tablet Take 0.5 mg by mouth 2 (two) times daily.       . diclofenac sodium (VOLTAREN) 1 % GEL Apply 1 application topically 3 (three) times daily.      . ferrous sulfate 325 (65 FE) MG tablet Take 325 mg by mouth daily with breakfast.       . FLUoxetine (PROZAC) 40 MG capsule Take 40 mg by mouth daily.       . pantoprazole (PROTONIX) 40 MG tablet Take 40 mg by mouth daily.      . potassium chloride SA (K-DUR,KLOR-CON) 20 MEQ tablet Take 1 tablet (20 mEq total) by mouth daily.  30 tablet  6  . Probiotic Product (ALIGN) 4 MG CAPS Take 1 capsule by mouth daily.        Marland Kitchen warfarin (COUMADIN) 5 MG tablet Take 2.5-5  mg by mouth daily. Takes 5 mg on sun, thur Takes 2.5 mg the rest of the week      . warfarin (COUMADIN) 5 MG tablet TAKE AS DIRECTED BY  ANTICOAGULATION  CLINIC  30 tablet  3    Allergies  Allergen Reactions  . Iohexol      Code: HIVES, Desc: POST MYELOGRAM.  PT ALSO SAID THIS HAPPENED A YEAR AGO WITH A MYELOGRAM AND WITH ESI'S., Onset Date: 16109604   . Meperidine Hcl     REACTION: unspecified  . Prednisone     Redness of skin & jittery    History   Social History  . Marital Status: Married    Spouse Name: N/A    Number of Children: 2  . Years of Education: N/A   Occupational History  . retired    Social History Main Topics  . Smoking status: Never Smoker   . Smokeless tobacco: Never Used  . Alcohol Use: Yes     rare use  . Drug Use: No  . Sexually Active: Not on file   Other Topics  Concern  . Not on file   Social History Narrative  . No narrative on file    Family History  Problem Relation Age of Onset  . Coronary artery disease Mother   . Other Mother     Cerebrovascular Disease  . Cancer    . Anesthesia problems Neg Hx   . Hypotension Neg Hx   . Malignant hyperthermia Neg Hx   . Pseudochol deficiency Neg Hx     ROS-  All systems are reviewed and are negative except as outlined in the HPI above  Physical Exam: Filed Vitals:   03/01/12 1353  BP: 126/84  Pulse: 84  Height: 5' 7.5" (1.715 m)  Weight: 191 lb 6.4 oz (86.818 kg)  SpO2: 97%    GEN- The patient is well appearing, alert and oriented x 3 today.   Head- normocephalic, atraumatic Eyes-  Sclera clear, conjunctiva pink Ears- hearing intact Oropharynx- clear Neck- supple, no JVP Lymph- no cervical lymphadenopathy Lungs- Clear to ausculation bilaterally, normal work of breathing Chest- pacemaker pocket is well healed Heart- Regular rate and rhythm, no murmurs, rubs or gallops, PMI not laterally displaced GI- soft, NT, ND, + BS Extremities- no clubbing, cyanosis, or edema MS- no significant deformity or atrophy Skin- no rash or lesion Psych- euthymic mood, full affect Neuro- strength and sensation are intact  Pacemaker interrogation- reviewed in detail today,  See PACEART report  Assessment and Plan:  1, Bradycardia He has reached ERI battery status See Arita Miss Art report Risks, benefits, and alternatives to PPM pulse generator replacement were discussed at length with the patient today who wishes to proceed.  We will schedule pulse generator replacement with Dr Ladona Ridgel (his primary electrophysiologist) at the next available time.  2. Afib Doing very well, without recurrence of afib post ablation Stop beta blocker today Continue coumadin CHADS2 score is 1.  He may be able to eventually stop coumadin if he does not have recurrence of afib.

## 2012-03-12 NOTE — Interval H&P Note (Signed)
History and Physical Interval Note: Since the patient was last seen by Dr. Johney Frame, there has been no change in the history and physical, assessment and plan. His PPM has reached ERI and we will plan PPM generator change.  03/12/2012 7:19 AM  [name] [name]  has presented today for surgery, with the diagnosis of End of life  The various methods of treatment have been discussed with the patient and family. After consideration of risks, benefits and other options for treatment, the patient has consented to  Procedure(s) (LRB) with comments: PERMANENT PACEMAKER GENERATOR CHANGE (N/A) as a surgical intervention .  The patient's history has been reviewed, patient examined, no change in status, stable for surgery.  I have reviewed the patient's chart and labs.  Questions were answered to the patient's satisfaction.     Leonia Reeves.D.

## 2012-03-15 MED FILL — Mupirocin Oint 2%: CUTANEOUS | Qty: 22 | Status: AC

## 2012-03-17 DIAGNOSIS — M25519 Pain in unspecified shoulder: Secondary | ICD-10-CM | POA: Diagnosis not present

## 2012-03-18 NOTE — Addendum Note (Signed)
Addended by: Marrion Coy L on: 03/18/2012 02:26 PM   Modules accepted: Orders

## 2012-03-22 ENCOUNTER — Ambulatory Visit: Payer: Medicare Other

## 2012-03-24 ENCOUNTER — Ambulatory Visit (INDEPENDENT_AMBULATORY_CARE_PROVIDER_SITE_OTHER): Payer: Medicare Other | Admitting: *Deleted

## 2012-03-24 ENCOUNTER — Encounter: Payer: Self-pay | Admitting: *Deleted

## 2012-03-24 DIAGNOSIS — Z7901 Long term (current) use of anticoagulants: Secondary | ICD-10-CM | POA: Diagnosis not present

## 2012-03-24 DIAGNOSIS — I4891 Unspecified atrial fibrillation: Secondary | ICD-10-CM

## 2012-03-24 LAB — POCT INR: INR: 2

## 2012-03-24 LAB — PACEMAKER DEVICE OBSERVATION
AL AMPLITUDE: 4 mv
AL IMPEDENCE PM: 454 Ohm
BAMS-0001: 150 {beats}/min
BATTERY VOLTAGE: 2.81 V
RV LEAD AMPLITUDE: 11.2 mv

## 2012-03-24 NOTE — Progress Notes (Signed)
Wound check pacer in clinic  

## 2012-03-25 DIAGNOSIS — M47812 Spondylosis without myelopathy or radiculopathy, cervical region: Secondary | ICD-10-CM | POA: Diagnosis not present

## 2012-03-29 ENCOUNTER — Encounter: Payer: Self-pay | Admitting: Internal Medicine

## 2012-03-29 DIAGNOSIS — M47812 Spondylosis without myelopathy or radiculopathy, cervical region: Secondary | ICD-10-CM | POA: Diagnosis not present

## 2012-03-29 DIAGNOSIS — M25519 Pain in unspecified shoulder: Secondary | ICD-10-CM | POA: Diagnosis not present

## 2012-04-01 DIAGNOSIS — M25519 Pain in unspecified shoulder: Secondary | ICD-10-CM | POA: Diagnosis not present

## 2012-04-01 DIAGNOSIS — M47812 Spondylosis without myelopathy or radiculopathy, cervical region: Secondary | ICD-10-CM | POA: Diagnosis not present

## 2012-04-05 DIAGNOSIS — M25519 Pain in unspecified shoulder: Secondary | ICD-10-CM | POA: Diagnosis not present

## 2012-04-05 DIAGNOSIS — M47812 Spondylosis without myelopathy or radiculopathy, cervical region: Secondary | ICD-10-CM | POA: Diagnosis not present

## 2012-04-08 DIAGNOSIS — M25519 Pain in unspecified shoulder: Secondary | ICD-10-CM | POA: Diagnosis not present

## 2012-04-08 DIAGNOSIS — M47812 Spondylosis without myelopathy or radiculopathy, cervical region: Secondary | ICD-10-CM | POA: Diagnosis not present

## 2012-04-14 DIAGNOSIS — M25819 Other specified joint disorders, unspecified shoulder: Secondary | ICD-10-CM | POA: Diagnosis not present

## 2012-04-14 DIAGNOSIS — M25569 Pain in unspecified knee: Secondary | ICD-10-CM | POA: Diagnosis not present

## 2012-04-19 ENCOUNTER — Ambulatory Visit (INDEPENDENT_AMBULATORY_CARE_PROVIDER_SITE_OTHER): Payer: Medicare Other

## 2012-04-19 DIAGNOSIS — I4891 Unspecified atrial fibrillation: Secondary | ICD-10-CM

## 2012-04-19 DIAGNOSIS — Z7901 Long term (current) use of anticoagulants: Secondary | ICD-10-CM | POA: Diagnosis not present

## 2012-05-05 DIAGNOSIS — F429 Obsessive-compulsive disorder, unspecified: Secondary | ICD-10-CM | POA: Diagnosis not present

## 2012-05-12 DIAGNOSIS — F429 Obsessive-compulsive disorder, unspecified: Secondary | ICD-10-CM | POA: Diagnosis not present

## 2012-05-17 ENCOUNTER — Ambulatory Visit (INDEPENDENT_AMBULATORY_CARE_PROVIDER_SITE_OTHER): Payer: Medicare Other | Admitting: *Deleted

## 2012-05-17 DIAGNOSIS — I4891 Unspecified atrial fibrillation: Secondary | ICD-10-CM

## 2012-05-17 DIAGNOSIS — Z7901 Long term (current) use of anticoagulants: Secondary | ICD-10-CM

## 2012-05-21 DIAGNOSIS — F429 Obsessive-compulsive disorder, unspecified: Secondary | ICD-10-CM | POA: Diagnosis not present

## 2012-06-04 ENCOUNTER — Ambulatory Visit (INDEPENDENT_AMBULATORY_CARE_PROVIDER_SITE_OTHER): Payer: Medicare Other

## 2012-06-04 DIAGNOSIS — F429 Obsessive-compulsive disorder, unspecified: Secondary | ICD-10-CM | POA: Diagnosis not present

## 2012-06-04 DIAGNOSIS — I4891 Unspecified atrial fibrillation: Secondary | ICD-10-CM | POA: Diagnosis not present

## 2012-06-04 DIAGNOSIS — Z7901 Long term (current) use of anticoagulants: Secondary | ICD-10-CM | POA: Diagnosis not present

## 2012-06-04 LAB — POCT INR: INR: 2.2

## 2012-06-17 ENCOUNTER — Other Ambulatory Visit: Payer: Self-pay | Admitting: Internal Medicine

## 2012-06-29 ENCOUNTER — Encounter: Payer: Self-pay | Admitting: Internal Medicine

## 2012-06-29 ENCOUNTER — Ambulatory Visit (INDEPENDENT_AMBULATORY_CARE_PROVIDER_SITE_OTHER): Payer: Medicare Other | Admitting: Internal Medicine

## 2012-06-29 ENCOUNTER — Ambulatory Visit (INDEPENDENT_AMBULATORY_CARE_PROVIDER_SITE_OTHER): Payer: Medicare Other | Admitting: *Deleted

## 2012-06-29 VITALS — BP 141/89 | HR 85 | Ht 67.5 in | Wt 194.0 lb

## 2012-06-29 DIAGNOSIS — I4891 Unspecified atrial fibrillation: Secondary | ICD-10-CM

## 2012-06-29 DIAGNOSIS — Z7901 Long term (current) use of anticoagulants: Secondary | ICD-10-CM

## 2012-06-29 DIAGNOSIS — Z950 Presence of cardiac pacemaker: Secondary | ICD-10-CM | POA: Diagnosis not present

## 2012-06-29 LAB — PACEMAKER DEVICE OBSERVATION
AL AMPLITUDE: 5.6 mv
AL IMPEDENCE PM: 453 Ohm
BATTERY VOLTAGE: 2.81 V
RV LEAD IMPEDENCE PM: 665 Ohm
VENTRICULAR PACING PM: 0

## 2012-06-29 LAB — POCT INR: INR: 2

## 2012-06-29 MED ORDER — PROPRANOLOL HCL 20 MG PO TABS: 20.0000 mg | ORAL_TABLET | Freq: Three times a day (TID) | ORAL | Status: DC

## 2012-06-29 NOTE — Progress Notes (Signed)
HPI [name] returns today for followup. He is a very pleasant 72 year old man with paroxysmal atrial fibrillation, status post ablation, hypertension, symptomatic bradycardia, status post permanent pacemaker insertion. In the interim, he has done well except for pain in his left knee. He is status post knee replacement. He denies fevers or chills. No palpitations. No chest pain or shortness of breath. Allergies  Allergen Reactions  . Iohexol      Code: HIVES, Desc: POST MYELOGRAM.  PT ALSO SAID THIS HAPPENED A YEAR AGO WITH A MYELOGRAM AND WITH ESI'S., Onset Date: 16109604   . Meperidine Hcl     REACTION: unspecified  . Prednisone     Redness of skin & jittery     Current Outpatient Prescriptions  Medication Sig Dispense Refill  . ALPRAZolam (XANAX) 0.5 MG tablet Take 0.5 mg by mouth 2 (two) times daily.       . diclofenac sodium (VOLTAREN) 1 % GEL Apply 1 application topically 3 (three) times daily.      Marland Kitchen doxycycline (VIBRAMYCIN) 100 MG capsule       . ferrous sulfate 325 (65 FE) MG tablet Take 325 mg by mouth daily with breakfast.       . FLUoxetine (PROZAC) 40 MG capsule Take 40 mg by mouth daily.       . pantoprazole (PROTONIX) 40 MG tablet Take 40 mg by mouth daily.      . potassium chloride SA (K-DUR,KLOR-CON) 20 MEQ tablet Take 20 mEq by mouth daily.      . Probiotic Product (ALIGN) 4 MG CAPS Take 1 capsule by mouth daily.        . propranolol (INDERAL) 20 MG tablet Take 20 mg by mouth 3 (three) times daily.      Marland Kitchen UNABLE TO FIND 100 mg daily. Med Name: Zyflamend      . warfarin (COUMADIN) 5 MG tablet Take 2.5-5 mg by mouth daily. Takes 5 mg on sun, Tues.  thur Takes 2.5 mg the rest of the week      . warfarin (COUMADIN) 5 MG tablet TAKE AS DIRECTED BY ANTICOAGULATION CLINIC  30 tablet  3     Past Medical History  Diagnosis Date  . Paroxysmal atrial fibrillation   . HTN (hypertension)   . Hyperlipidemia   . Migraines   . Arthritis of knee, right     end-stage  .  Bursitis   . Gout   . Tinnitus   . Depression   . Bronchitis   . Heart murmur   . Anemia     since post op- knee replacement   . Complication of anesthesia     irritation in throat postop, makes a-fib worse   . DJD (degenerative joint disease)     OA- "everywhere"  . Anxiety     takes xanax 2 times per day, taken mostly for ringing in ears    . PONV (postoperative nausea and vomiting)   . Family history of anesthesia complication     sister with nausia and vomiting    ROS:   All systems reviewed and negative except as noted in the HPI.   Past Surgical History  Procedure Date  . Transesophageal echocardiogram 04/2008  . Transthoracic echocardiogram 2006,2009  . Bilteral thumb joint surgrery   . Right rotator cuff surgery 2006  . Cholecystectomy   . Rhinoplasty   . Uvulectomy   . Appendectomy   . Pacemaker insertion 06/06/2005    Medtronic EnRhythm dual-chamber. For sick sinus  syndrome  . Right total hip arthroplasty   . Left rotator cuff repair   . Cardiac catheterization 2005     which revealed 40% stenosis of the mid left anterior descending artery., ablation- 2009  . Tonsillectomy     as a child  . Joint replacement     L knee- 12/11,R hip- 1996  . Total knee revision 08/08/2011    Procedure: TOTAL KNEE REVISION;  Surgeon: Nestor Lewandowsky, MD;  Location: [facility] OR;  Service: Orthopedics;  Laterality: Left;  DEPUY/SIGMA  . Atrial fibrillation ablation 2009, 12/02/11    PVI by Dr Johney Frame x 2  . Tee without cardioversion 12/01/2011    Procedure: TRANSESOPHAGEAL ECHOCARDIOGRAM (TEE);  Surgeon: Pricilla Riffle, MD;  Location: [facility] ENDOSCOPY;  Service: Cardiovascular;  Laterality: N/A;  a-fib ablation following day     Family History  Problem Relation Age of Onset  . Coronary artery disease Mother   . Other Mother     Cerebrovascular Disease  . Cancer    . Anesthesia problems Neg Hx   . Hypotension Neg Hx   . Malignant hyperthermia Neg Hx   . Pseudochol deficiency Neg Hx       History   Social History  . Marital Status: Married    Spouse Name: N/A    Number of Children: 2  . Years of Education: N/A   Occupational History  . retired    Social History Main Topics  . Smoking status: Never Smoker   . Smokeless tobacco: Never Used  . Alcohol Use: Yes     Comment: rare use  . Drug Use: No  . Sexually Active: Not on file   Other Topics Concern  . Not on file   Social History Narrative  . No narrative on file     BP 141/89  Pulse 85  Ht 5' 7.5" (1.715 m)  Wt 194 lb (87.998 kg)  BMI 29.94 kg/m2  Physical Exam:  Well appearing 72 year old man, NAD HEENT: Unremarkable Neck:  No JVD, no thyromegally Lungs:  Clear with no wheezes HEART:  Regular rate rhythm, no murmurs, no rubs, no clicks Abd:  soft, positive bowel sounds, no organomegally, no rebound, no guarding Ext:  2 plus pulses, no edema, no cyanosis, no clubbing Skin:  No rashes no nodules Neuro:  CN II through XII intact, motor grossly intact  DEVICE  Normal device function.  See PaceArt for details.   Assess/Plan:

## 2012-06-29 NOTE — Patient Instructions (Addendum)
Your physician wants you to follow-up in: Oct 2014 with Dr Court Joy will receive a reminder letter in the mail two months in advance. If you don't receive a letter, please call our office to schedule the follow-up appointment.

## 2012-06-29 NOTE — Assessment & Plan Note (Signed)
His Medtronic dual-chamber pacemaker is working nicely. We'll plan to recheck in several months.

## 2012-06-29 NOTE — Assessment & Plan Note (Signed)
He is maintaining sinus rhythm very nicely, status post ablation. No change in medical therapy.

## 2012-07-01 ENCOUNTER — Telehealth: Payer: Self-pay | Admitting: Internal Medicine

## 2012-07-01 NOTE — Telephone Encounter (Signed)
New problem   Has information to rely to [facility] related to golf.

## 2012-07-01 NOTE — Telephone Encounter (Signed)
Will relay info to Dr Ladona Ridgel

## 2012-07-06 DIAGNOSIS — M999 Biomechanical lesion, unspecified: Secondary | ICD-10-CM | POA: Diagnosis not present

## 2012-07-06 DIAGNOSIS — M5137 Other intervertebral disc degeneration, lumbosacral region: Secondary | ICD-10-CM | POA: Diagnosis not present

## 2012-07-06 DIAGNOSIS — M25559 Pain in unspecified hip: Secondary | ICD-10-CM | POA: Diagnosis not present

## 2012-07-07 ENCOUNTER — Other Ambulatory Visit: Payer: Self-pay | Admitting: Internal Medicine

## 2012-07-13 DIAGNOSIS — M5137 Other intervertebral disc degeneration, lumbosacral region: Secondary | ICD-10-CM | POA: Diagnosis not present

## 2012-07-13 DIAGNOSIS — M999 Biomechanical lesion, unspecified: Secondary | ICD-10-CM | POA: Diagnosis not present

## 2012-07-13 DIAGNOSIS — M25559 Pain in unspecified hip: Secondary | ICD-10-CM | POA: Diagnosis not present

## 2012-07-16 DIAGNOSIS — M25559 Pain in unspecified hip: Secondary | ICD-10-CM | POA: Diagnosis not present

## 2012-07-16 DIAGNOSIS — M5137 Other intervertebral disc degeneration, lumbosacral region: Secondary | ICD-10-CM | POA: Diagnosis not present

## 2012-07-16 DIAGNOSIS — M999 Biomechanical lesion, unspecified: Secondary | ICD-10-CM | POA: Diagnosis not present

## 2012-07-19 DIAGNOSIS — M999 Biomechanical lesion, unspecified: Secondary | ICD-10-CM | POA: Diagnosis not present

## 2012-07-19 DIAGNOSIS — M5137 Other intervertebral disc degeneration, lumbosacral region: Secondary | ICD-10-CM | POA: Diagnosis not present

## 2012-07-19 DIAGNOSIS — M25559 Pain in unspecified hip: Secondary | ICD-10-CM | POA: Diagnosis not present

## 2012-07-21 DIAGNOSIS — M999 Biomechanical lesion, unspecified: Secondary | ICD-10-CM | POA: Diagnosis not present

## 2012-07-21 DIAGNOSIS — M25559 Pain in unspecified hip: Secondary | ICD-10-CM | POA: Diagnosis not present

## 2012-07-21 DIAGNOSIS — M5137 Other intervertebral disc degeneration, lumbosacral region: Secondary | ICD-10-CM | POA: Diagnosis not present

## 2012-07-23 DIAGNOSIS — Z85828 Personal history of other malignant neoplasm of skin: Secondary | ICD-10-CM | POA: Diagnosis not present

## 2012-07-23 DIAGNOSIS — M25559 Pain in unspecified hip: Secondary | ICD-10-CM | POA: Diagnosis not present

## 2012-07-23 DIAGNOSIS — L738 Other specified follicular disorders: Secondary | ICD-10-CM | POA: Diagnosis not present

## 2012-07-23 DIAGNOSIS — M999 Biomechanical lesion, unspecified: Secondary | ICD-10-CM | POA: Diagnosis not present

## 2012-07-23 DIAGNOSIS — D235 Other benign neoplasm of skin of trunk: Secondary | ICD-10-CM | POA: Diagnosis not present

## 2012-07-23 DIAGNOSIS — L570 Actinic keratosis: Secondary | ICD-10-CM | POA: Diagnosis not present

## 2012-07-23 DIAGNOSIS — M5137 Other intervertebral disc degeneration, lumbosacral region: Secondary | ICD-10-CM | POA: Diagnosis not present

## 2012-07-26 DIAGNOSIS — M25559 Pain in unspecified hip: Secondary | ICD-10-CM | POA: Diagnosis not present

## 2012-07-26 DIAGNOSIS — M999 Biomechanical lesion, unspecified: Secondary | ICD-10-CM | POA: Diagnosis not present

## 2012-07-26 DIAGNOSIS — M5137 Other intervertebral disc degeneration, lumbosacral region: Secondary | ICD-10-CM | POA: Diagnosis not present

## 2012-07-28 ENCOUNTER — Ambulatory Visit (INDEPENDENT_AMBULATORY_CARE_PROVIDER_SITE_OTHER): Payer: Medicare Other | Admitting: *Deleted

## 2012-07-28 DIAGNOSIS — M5137 Other intervertebral disc degeneration, lumbosacral region: Secondary | ICD-10-CM | POA: Diagnosis not present

## 2012-07-28 DIAGNOSIS — M999 Biomechanical lesion, unspecified: Secondary | ICD-10-CM | POA: Diagnosis not present

## 2012-07-28 DIAGNOSIS — I4891 Unspecified atrial fibrillation: Secondary | ICD-10-CM | POA: Diagnosis not present

## 2012-07-28 DIAGNOSIS — Z7901 Long term (current) use of anticoagulants: Secondary | ICD-10-CM

## 2012-07-28 DIAGNOSIS — M25559 Pain in unspecified hip: Secondary | ICD-10-CM | POA: Diagnosis not present

## 2012-08-02 DIAGNOSIS — M25559 Pain in unspecified hip: Secondary | ICD-10-CM | POA: Diagnosis not present

## 2012-08-02 DIAGNOSIS — M5137 Other intervertebral disc degeneration, lumbosacral region: Secondary | ICD-10-CM | POA: Diagnosis not present

## 2012-08-02 DIAGNOSIS — M999 Biomechanical lesion, unspecified: Secondary | ICD-10-CM | POA: Diagnosis not present

## 2012-08-03 DIAGNOSIS — M5137 Other intervertebral disc degeneration, lumbosacral region: Secondary | ICD-10-CM | POA: Diagnosis not present

## 2012-08-03 DIAGNOSIS — M999 Biomechanical lesion, unspecified: Secondary | ICD-10-CM | POA: Diagnosis not present

## 2012-08-03 DIAGNOSIS — M25559 Pain in unspecified hip: Secondary | ICD-10-CM | POA: Diagnosis not present

## 2012-08-09 DIAGNOSIS — M5137 Other intervertebral disc degeneration, lumbosacral region: Secondary | ICD-10-CM | POA: Diagnosis not present

## 2012-08-09 DIAGNOSIS — M999 Biomechanical lesion, unspecified: Secondary | ICD-10-CM | POA: Diagnosis not present

## 2012-08-09 DIAGNOSIS — M25559 Pain in unspecified hip: Secondary | ICD-10-CM | POA: Diagnosis not present

## 2012-08-13 DIAGNOSIS — M999 Biomechanical lesion, unspecified: Secondary | ICD-10-CM | POA: Diagnosis not present

## 2012-08-13 DIAGNOSIS — M5137 Other intervertebral disc degeneration, lumbosacral region: Secondary | ICD-10-CM | POA: Diagnosis not present

## 2012-08-13 DIAGNOSIS — M25559 Pain in unspecified hip: Secondary | ICD-10-CM | POA: Diagnosis not present

## 2012-08-16 DIAGNOSIS — L821 Other seborrheic keratosis: Secondary | ICD-10-CM | POA: Diagnosis not present

## 2012-08-20 DIAGNOSIS — M25559 Pain in unspecified hip: Secondary | ICD-10-CM | POA: Diagnosis not present

## 2012-08-20 DIAGNOSIS — M5137 Other intervertebral disc degeneration, lumbosacral region: Secondary | ICD-10-CM | POA: Diagnosis not present

## 2012-08-20 DIAGNOSIS — M999 Biomechanical lesion, unspecified: Secondary | ICD-10-CM | POA: Diagnosis not present

## 2012-08-23 DIAGNOSIS — M5137 Other intervertebral disc degeneration, lumbosacral region: Secondary | ICD-10-CM | POA: Diagnosis not present

## 2012-08-23 DIAGNOSIS — M25559 Pain in unspecified hip: Secondary | ICD-10-CM | POA: Diagnosis not present

## 2012-08-23 DIAGNOSIS — M999 Biomechanical lesion, unspecified: Secondary | ICD-10-CM | POA: Diagnosis not present

## 2012-08-24 DIAGNOSIS — M542 Cervicalgia: Secondary | ICD-10-CM | POA: Diagnosis not present

## 2012-08-24 DIAGNOSIS — Z006 Encounter for examination for normal comparison and control in clinical research program: Secondary | ICD-10-CM | POA: Diagnosis not present

## 2012-08-24 DIAGNOSIS — M545 Low back pain, unspecified: Secondary | ICD-10-CM | POA: Diagnosis not present

## 2012-08-24 DIAGNOSIS — M76899 Other specified enthesopathies of unspecified lower limb, excluding foot: Secondary | ICD-10-CM | POA: Diagnosis not present

## 2012-08-26 DIAGNOSIS — M5137 Other intervertebral disc degeneration, lumbosacral region: Secondary | ICD-10-CM | POA: Diagnosis not present

## 2012-08-26 DIAGNOSIS — M999 Biomechanical lesion, unspecified: Secondary | ICD-10-CM | POA: Diagnosis not present

## 2012-08-26 DIAGNOSIS — M25559 Pain in unspecified hip: Secondary | ICD-10-CM | POA: Diagnosis not present

## 2012-08-30 DIAGNOSIS — F429 Obsessive-compulsive disorder, unspecified: Secondary | ICD-10-CM | POA: Diagnosis not present

## 2012-09-03 ENCOUNTER — Ambulatory Visit (INDEPENDENT_AMBULATORY_CARE_PROVIDER_SITE_OTHER): Payer: Medicare Other | Admitting: *Deleted

## 2012-09-03 DIAGNOSIS — M25559 Pain in unspecified hip: Secondary | ICD-10-CM | POA: Diagnosis not present

## 2012-09-03 DIAGNOSIS — I4891 Unspecified atrial fibrillation: Secondary | ICD-10-CM

## 2012-09-03 DIAGNOSIS — Z7901 Long term (current) use of anticoagulants: Secondary | ICD-10-CM | POA: Diagnosis not present

## 2012-09-03 DIAGNOSIS — M5137 Other intervertebral disc degeneration, lumbosacral region: Secondary | ICD-10-CM | POA: Diagnosis not present

## 2012-09-03 DIAGNOSIS — M999 Biomechanical lesion, unspecified: Secondary | ICD-10-CM | POA: Diagnosis not present

## 2012-09-03 LAB — POCT INR: INR: 2.2

## 2012-09-13 DIAGNOSIS — M25559 Pain in unspecified hip: Secondary | ICD-10-CM | POA: Diagnosis not present

## 2012-09-13 DIAGNOSIS — M5137 Other intervertebral disc degeneration, lumbosacral region: Secondary | ICD-10-CM | POA: Diagnosis not present

## 2012-09-13 DIAGNOSIS — M999 Biomechanical lesion, unspecified: Secondary | ICD-10-CM | POA: Diagnosis not present

## 2012-09-20 DIAGNOSIS — M542 Cervicalgia: Secondary | ICD-10-CM | POA: Diagnosis not present

## 2012-09-20 DIAGNOSIS — M25519 Pain in unspecified shoulder: Secondary | ICD-10-CM | POA: Diagnosis not present

## 2012-09-29 DIAGNOSIS — M999 Biomechanical lesion, unspecified: Secondary | ICD-10-CM | POA: Diagnosis not present

## 2012-09-29 DIAGNOSIS — M5137 Other intervertebral disc degeneration, lumbosacral region: Secondary | ICD-10-CM | POA: Diagnosis not present

## 2012-09-29 DIAGNOSIS — M25559 Pain in unspecified hip: Secondary | ICD-10-CM | POA: Diagnosis not present

## 2012-10-13 ENCOUNTER — Ambulatory Visit (INDEPENDENT_AMBULATORY_CARE_PROVIDER_SITE_OTHER): Payer: Medicare Other | Admitting: *Deleted

## 2012-10-13 DIAGNOSIS — Z7901 Long term (current) use of anticoagulants: Secondary | ICD-10-CM | POA: Diagnosis not present

## 2012-10-13 DIAGNOSIS — I4891 Unspecified atrial fibrillation: Secondary | ICD-10-CM | POA: Diagnosis not present

## 2012-10-13 LAB — POCT INR: INR: 2.5

## 2012-10-25 DIAGNOSIS — M542 Cervicalgia: Secondary | ICD-10-CM | POA: Diagnosis not present

## 2012-11-17 ENCOUNTER — Other Ambulatory Visit: Payer: Self-pay | Admitting: Internal Medicine

## 2012-11-24 ENCOUNTER — Ambulatory Visit (INDEPENDENT_AMBULATORY_CARE_PROVIDER_SITE_OTHER): Payer: Medicare Other | Admitting: *Deleted

## 2012-11-24 DIAGNOSIS — I4891 Unspecified atrial fibrillation: Secondary | ICD-10-CM

## 2012-11-24 DIAGNOSIS — Z7901 Long term (current) use of anticoagulants: Secondary | ICD-10-CM

## 2012-12-08 ENCOUNTER — Ambulatory Visit (INDEPENDENT_AMBULATORY_CARE_PROVIDER_SITE_OTHER): Payer: Medicare Other | Admitting: *Deleted

## 2012-12-08 DIAGNOSIS — Z7901 Long term (current) use of anticoagulants: Secondary | ICD-10-CM

## 2012-12-08 DIAGNOSIS — I4891 Unspecified atrial fibrillation: Secondary | ICD-10-CM

## 2012-12-08 LAB — POCT INR: INR: 2.4

## 2012-12-28 DIAGNOSIS — M25569 Pain in unspecified knee: Secondary | ICD-10-CM | POA: Diagnosis not present

## 2012-12-29 ENCOUNTER — Ambulatory Visit (INDEPENDENT_AMBULATORY_CARE_PROVIDER_SITE_OTHER): Payer: Medicare Other | Admitting: *Deleted

## 2012-12-29 ENCOUNTER — Other Ambulatory Visit (HOSPITAL_COMMUNITY): Payer: Self-pay | Admitting: Orthopedic Surgery

## 2012-12-29 DIAGNOSIS — Z7901 Long term (current) use of anticoagulants: Secondary | ICD-10-CM | POA: Diagnosis not present

## 2012-12-29 DIAGNOSIS — I4891 Unspecified atrial fibrillation: Secondary | ICD-10-CM

## 2012-12-29 DIAGNOSIS — M25562 Pain in left knee: Secondary | ICD-10-CM

## 2012-12-29 LAB — POCT INR: INR: 2.3

## 2012-12-30 ENCOUNTER — Encounter: Payer: Self-pay | Admitting: Internal Medicine

## 2013-01-03 ENCOUNTER — Other Ambulatory Visit: Payer: Self-pay | Admitting: Internal Medicine

## 2013-01-05 DIAGNOSIS — M25569 Pain in unspecified knee: Secondary | ICD-10-CM | POA: Diagnosis not present

## 2013-01-07 ENCOUNTER — Encounter (HOSPITAL_COMMUNITY)
Admission: RE | Admit: 2013-01-07 | Discharge: 2013-01-07 | Disposition: A | Discharge status: Home / Self Care | Payer: Medicare Other | Source: Ambulatory Visit | Attending: Orthopedic Surgery | Admitting: Orthopedic Surgery

## 2013-01-07 DIAGNOSIS — M25562 Pain in left knee: Secondary | ICD-10-CM

## 2013-01-07 DIAGNOSIS — M25469 Effusion, unspecified knee: Secondary | ICD-10-CM | POA: Diagnosis not present

## 2013-01-07 DIAGNOSIS — M7989 Other specified soft tissue disorders: Secondary | ICD-10-CM | POA: Diagnosis not present

## 2013-01-07 MED ORDER — TECHNETIUM TC 99M MEDRONATE IV KIT
25.0000 | PACK | Freq: Once | INTRAVENOUS | Status: AC | PRN
  Administered 2013-01-07: 25 via INTRAVENOUS

## 2013-01-19 DIAGNOSIS — M25569 Pain in unspecified knee: Secondary | ICD-10-CM | POA: Diagnosis not present

## 2013-01-20 DIAGNOSIS — M25569 Pain in unspecified knee: Secondary | ICD-10-CM | POA: Diagnosis not present

## 2013-01-26 ENCOUNTER — Ambulatory Visit (INDEPENDENT_AMBULATORY_CARE_PROVIDER_SITE_OTHER): Payer: Medicare Other | Admitting: *Deleted

## 2013-01-26 DIAGNOSIS — I4891 Unspecified atrial fibrillation: Secondary | ICD-10-CM | POA: Diagnosis not present

## 2013-01-26 DIAGNOSIS — Z7901 Long term (current) use of anticoagulants: Secondary | ICD-10-CM | POA: Diagnosis not present

## 2013-01-26 LAB — POCT INR: INR: 3

## 2013-01-28 DIAGNOSIS — Z85828 Personal history of other malignant neoplasm of skin: Secondary | ICD-10-CM | POA: Diagnosis not present

## 2013-01-28 DIAGNOSIS — L570 Actinic keratosis: Secondary | ICD-10-CM | POA: Diagnosis not present

## 2013-01-28 DIAGNOSIS — L738 Other specified follicular disorders: Secondary | ICD-10-CM | POA: Diagnosis not present

## 2013-01-28 DIAGNOSIS — D235 Other benign neoplasm of skin of trunk: Secondary | ICD-10-CM | POA: Diagnosis not present

## 2013-01-31 DIAGNOSIS — K219 Gastro-esophageal reflux disease without esophagitis: Secondary | ICD-10-CM | POA: Diagnosis not present

## 2013-01-31 DIAGNOSIS — M1990 Unspecified osteoarthritis, unspecified site: Secondary | ICD-10-CM | POA: Diagnosis not present

## 2013-01-31 DIAGNOSIS — E7800 Pure hypercholesterolemia, unspecified: Secondary | ICD-10-CM | POA: Diagnosis not present

## 2013-01-31 DIAGNOSIS — G43009 Migraine without aura, not intractable, without status migrainosus: Secondary | ICD-10-CM | POA: Diagnosis not present

## 2013-01-31 DIAGNOSIS — Z23 Encounter for immunization: Secondary | ICD-10-CM | POA: Diagnosis not present

## 2013-01-31 DIAGNOSIS — H9319 Tinnitus, unspecified ear: Secondary | ICD-10-CM | POA: Diagnosis not present

## 2013-01-31 DIAGNOSIS — I4891 Unspecified atrial fibrillation: Secondary | ICD-10-CM | POA: Diagnosis not present

## 2013-02-02 DIAGNOSIS — M25569 Pain in unspecified knee: Secondary | ICD-10-CM | POA: Diagnosis not present

## 2013-02-23 ENCOUNTER — Ambulatory Visit (INDEPENDENT_AMBULATORY_CARE_PROVIDER_SITE_OTHER): Payer: Medicare Other | Admitting: *Deleted

## 2013-02-23 DIAGNOSIS — Z7901 Long term (current) use of anticoagulants: Secondary | ICD-10-CM

## 2013-02-23 DIAGNOSIS — I4891 Unspecified atrial fibrillation: Secondary | ICD-10-CM | POA: Diagnosis not present

## 2013-02-28 DIAGNOSIS — F429 Obsessive-compulsive disorder, unspecified: Secondary | ICD-10-CM | POA: Diagnosis not present

## 2013-03-16 ENCOUNTER — Encounter: Payer: Medicare Other | Admitting: Internal Medicine

## 2013-03-22 ENCOUNTER — Ambulatory Visit (INDEPENDENT_AMBULATORY_CARE_PROVIDER_SITE_OTHER): Payer: Medicare Other | Admitting: Internal Medicine

## 2013-03-22 ENCOUNTER — Encounter: Payer: Self-pay | Admitting: Internal Medicine

## 2013-03-22 ENCOUNTER — Ambulatory Visit (INDEPENDENT_AMBULATORY_CARE_PROVIDER_SITE_OTHER): Payer: Medicare Other | Admitting: *Deleted

## 2013-03-22 VITALS — BP 143/80 | HR 68 | Ht 67.5 in | Wt 193.4 lb

## 2013-03-22 DIAGNOSIS — I4891 Unspecified atrial fibrillation: Secondary | ICD-10-CM

## 2013-03-22 DIAGNOSIS — Z7901 Long term (current) use of anticoagulants: Secondary | ICD-10-CM

## 2013-03-22 DIAGNOSIS — I10 Essential (primary) hypertension: Secondary | ICD-10-CM

## 2013-03-22 LAB — PACEMAKER DEVICE OBSERVATION
AL AMPLITUDE: 5.6 mv
AL THRESHOLD: 0.5 V
ATRIAL PACING PM: 99
BAMS-0001: 150 {beats}/min
RV LEAD AMPLITUDE: 11.2 mv
RV LEAD IMPEDENCE PM: 496 Ohm
RV LEAD THRESHOLD: 1 V

## 2013-03-22 LAB — POCT INR: INR: 3.9

## 2013-03-22 NOTE — Assessment & Plan Note (Signed)
His blood pressure is a bit elevated today. We discussed the possibility of changing meds. I discussed stopping his propranolol and switching to coreg. I have asked the patient to check and recored his blood pressure and to let me know if his pressure is elevated at home. If so, he is instructed to call and we would start coreg 12.5 twice daily.

## 2013-03-22 NOTE — Progress Notes (Signed)
HPI [name] returns today for followup. He is a very pleasant 72 year old man with paroxysmal atrial fibrillation, status post ablation, hypertension, symptomatic bradycardia, status post permanent pacemaker insertion. He denies fevers or chills. No palpitations. No chest pain or shortness of breath. He is a bit concerned about his blood pressure although he does not usually check it at home. Allergies  Allergen Reactions  . Iohexol      Code: HIVES, Desc: POST MYELOGRAM.  PT ALSO SAID THIS HAPPENED A YEAR AGO WITH A MYELOGRAM AND WITH ESI'S., Onset Date: 16109604   . Meperidine Hcl     REACTION: unspecified  . Prednisone     Redness of skin & jittery     Current Outpatient Prescriptions  Medication Sig Dispense Refill  . ALPRAZolam (XANAX) 0.5 MG tablet Take 0.5 mg by mouth 2 (two) times daily.       . ferrous sulfate 325 (65 FE) MG tablet Take 325 mg by mouth daily with breakfast.       . FLUoxetine (PROZAC) 40 MG capsule Take 40 mg by mouth daily.       . meloxicam (MOBIC) 7.5 MG tablet Take 15 mg by mouth daily.       . pantoprazole (PROTONIX) 40 MG tablet Take 40 mg by mouth daily.      . potassium chloride SA (K-DUR,KLOR-CON) 20 MEQ tablet Take 20 mEq by mouth daily.      . Probiotic Product (ALIGN) 4 MG CAPS Take 1 capsule by mouth daily.        . propranolol (INDERAL) 20 MG tablet Take 20 mg by mouth 2 (two) times daily.      Marland Kitchen UNABLE TO FIND 100 mg daily. Med Name: Zyflamend      . warfarin (COUMADIN) 5 MG tablet TAKE AS DIRECTED BY ANITCOAGULATION CLINIC  30 tablet  3  . doxycycline (VIBRAMYCIN) 100 MG capsule        No current facility-administered medications for this visit.     Past Medical History  Diagnosis Date  . Paroxysmal atrial fibrillation   . HTN (hypertension)   . Hyperlipidemia   . Migraines   . Arthritis of knee, right     end-stage  . Bursitis   . Gout   . Tinnitus   . Depression   . Bronchitis   . Heart murmur   . Anemia     since post op-  knee replacement   . Complication of anesthesia     irritation in throat postop, makes a-fib worse   . DJD (degenerative joint disease)     OA- "everywhere"  . Anxiety     takes xanax 2 times per day, taken mostly for ringing in ears    . PONV (postoperative nausea and vomiting)   . Family history of anesthesia complication     sister with nausia and vomiting    ROS:   All systems reviewed and negative except as noted in the HPI.   Past Surgical History  Procedure Laterality Date  . Transesophageal echocardiogram  04/2008  . Transthoracic echocardiogram  2006,2009  . Bilteral thumb joint surgrery    . Right rotator cuff surgery  2006  . Cholecystectomy    . Rhinoplasty    . Uvulectomy    . Appendectomy    . Pacemaker insertion  06/06/2005    Medtronic EnRhythm dual-chamber. For sick sinus syndrome  . Right total hip arthroplasty    . Left rotator cuff repair    .  Cardiac catheterization  2005     which revealed 40% stenosis of the mid left anterior descending artery., ablation- 2009  . Tonsillectomy      as a child  . Joint replacement      L knee- 12/11,R hip- 1996  . Total knee revision  08/08/2011    Procedure: TOTAL KNEE REVISION;  Surgeon: Nestor Lewandowsky, MD;  Location: [facility] OR;  Service: Orthopedics;  Laterality: Left;  DEPUY/SIGMA  . Atrial fibrillation ablation  2009, 12/02/11    PVI by Dr Johney Frame x 2  . Tee without cardioversion  12/01/2011    Procedure: TRANSESOPHAGEAL ECHOCARDIOGRAM (TEE);  Surgeon: Pricilla Riffle, MD;  Location: [facility] ENDOSCOPY;  Service: Cardiovascular;  Laterality: N/A;  a-fib ablation following day     Family History  Problem Relation Age of Onset  . Coronary artery disease Mother   . Other Mother     Cerebrovascular Disease  . Cancer    . Anesthesia problems Neg Hx   . Hypotension Neg Hx   . Malignant hyperthermia Neg Hx   . Pseudochol deficiency Neg Hx      History   Social History  . Marital Status: Married    Spouse Name: N/A     Number of Children: 2  . Years of Education: N/A   Occupational History  . retired    Social History Main Topics  . Smoking status: Never Smoker   . Smokeless tobacco: Never Used  . Alcohol Use: Yes     Comment: rare use  . Drug Use: No  . Sexual Activity: Not on file   Other Topics Concern  . Not on file   Social History Narrative  . No narrative on file     BP 143/80  Pulse 68  Ht 5' 7.5" (1.715 m)  Wt 193 lb 6.4 oz (87.726 kg)  BMI 29.83 kg/m2  Physical Exam:  Well appearing 72 year old man, NAD HEENT: Unremarkable Neck:  No JVD, no thyromegally Lungs:  Clear with no wheezes HEART:  Regular rate rhythm, no murmurs, no rubs, no clicks Abd:  soft, positive bowel sounds, no organomegally, no rebound, no guarding Ext:  2 plus pulses, no edema, no cyanosis, no clubbing Skin:  No rashes no nodules Neuro:  CN II through XII intact, motor grossly intact  DEVICE  Normal device function.  See PaceArt for details.   Assess/Plan:

## 2013-03-22 NOTE — Patient Instructions (Signed)
Your physician wants you to follow-up in: 12 months with Dr Court Joy will receive a reminder letter in the mail two months in advance. If you don't receive a letter, please call our office to schedule the follow-up appointment.    Remote monitoring is used to monitor your Pacemaker or ICD from home. This monitoring reduces the number of office visits required to check your device to one time per year. It allows [place] to keep an eye on the functioning of your device to ensure it is working properly. You are scheduled for a device check from home on 06/27/13. You may send your transmission at any time that day. If you have a wireless device, the transmission will be sent automatically. After your physician reviews your transmission, you will receive a postcard with your next transmission date.

## 2013-03-22 NOTE — Assessment & Plan Note (Signed)
He is maintaining NSR 99% of the time. I have recommended that the patient continue his current medical therapy.

## 2013-03-24 ENCOUNTER — Emergency Department (EMERGENCY_DEPARTMENT_HOSPITAL)
Admission: EM | Admit: 2013-03-24 | Discharge: 2013-03-25 | Disposition: A | Discharge status: Home / Self Care | Payer: Medicare Other | Source: Home / Self Care | Attending: Emergency Medicine | Admitting: Emergency Medicine

## 2013-03-24 ENCOUNTER — Encounter (HOSPITAL_COMMUNITY): Payer: Self-pay | Admitting: Emergency Medicine

## 2013-03-24 DIAGNOSIS — F329 Major depressive disorder, single episode, unspecified: Secondary | ICD-10-CM | POA: Diagnosis not present

## 2013-03-24 DIAGNOSIS — I4891 Unspecified atrial fibrillation: Secondary | ICD-10-CM | POA: Diagnosis not present

## 2013-03-24 DIAGNOSIS — R45851 Suicidal ideations: Secondary | ICD-10-CM | POA: Diagnosis not present

## 2013-03-24 DIAGNOSIS — R110 Nausea: Secondary | ICD-10-CM | POA: Diagnosis not present

## 2013-03-24 DIAGNOSIS — F3289 Other specified depressive episodes: Secondary | ICD-10-CM | POA: Diagnosis not present

## 2013-03-24 DIAGNOSIS — I10 Essential (primary) hypertension: Secondary | ICD-10-CM | POA: Diagnosis not present

## 2013-03-24 DIAGNOSIS — R4589 Other symptoms and signs involving emotional state: Secondary | ICD-10-CM

## 2013-03-24 DIAGNOSIS — F411 Generalized anxiety disorder: Secondary | ICD-10-CM | POA: Diagnosis present

## 2013-03-24 DIAGNOSIS — F332 Major depressive disorder, recurrent severe without psychotic features: Secondary | ICD-10-CM | POA: Diagnosis not present

## 2013-03-24 DIAGNOSIS — Z79899 Other long term (current) drug therapy: Secondary | ICD-10-CM | POA: Diagnosis not present

## 2013-03-24 DIAGNOSIS — I119 Hypertensive heart disease without heart failure: Secondary | ICD-10-CM | POA: Diagnosis not present

## 2013-03-24 DIAGNOSIS — D68318 Other hemorrhagic disorder due to intrinsic circulating anticoagulants, antibodies, or inhibitors: Secondary | ICD-10-CM | POA: Diagnosis not present

## 2013-03-24 DIAGNOSIS — R51 Headache: Secondary | ICD-10-CM | POA: Diagnosis not present

## 2013-03-24 DIAGNOSIS — T50901A Poisoning by unspecified drugs, medicaments and biological substances, accidental (unintentional), initial encounter: Secondary | ICD-10-CM | POA: Diagnosis not present

## 2013-03-24 LAB — RAPID URINE DRUG SCREEN, HOSP PERFORMED
Amphetamines: NOT DETECTED
Opiates: NOT DETECTED
Tetrahydrocannabinol: NOT DETECTED

## 2013-03-24 NOTE — ED Notes (Signed)
Per EMS report: pt had 10-12 beers and took 10-12 0.5mg  of Xanax, 40mg  of prozac, which is totaled to 80mg  for the day since pt took a double dose of prozac.  Pt hx of depression and cat died today.  Pt is currently expressing thoughts of SI.  Pt a/o x 4. EMS took an initial CBG 65 and then after giving pt oral glucose, pt's CBG went to 92. EMS: BP: 165/96, HR: 92, RR: 16

## 2013-03-24 NOTE — ED Notes (Signed)
Bed: ZO10 Expected date: 03/24/13 Expected time: 10:48 PM Means of arrival: Ambulance Comments: Etoh/xanax/SI

## 2013-03-24 NOTE — ED Notes (Signed)
Dr. Yao at bedside. 

## 2013-03-24 NOTE — ED Provider Notes (Signed)
CSN: [id number]     Arrival date & time 03/24/13  2312 History   First MD Initiated Contact with Patient 03/24/13 2314     Chief Complaint  Patient presents with  . Suicide Attempt   (Consider location/radiation/quality/duration/timing/severity/associated sxs/prior Treatment) The history is provided by the patient.  [name] [name] is a 72 y.o. male history of paroxysmal A. Fib on coumadin, HTN, HL here presenting with suicidal attempt. He felt depressed for several days. Today his cat died so he felt very depressed and had some thoughts of killing himself. He felt that life was not worth living. And 7 PM he drank 10-12 beers and took 10-12 0.5 mg of Xanax and 40 mg of Prozac. Denies any other drug use.    Past Medical History  Diagnosis Date  . Paroxysmal atrial fibrillation   . HTN (hypertension)   . Hyperlipidemia   . Migraines   . Arthritis of knee, right     end-stage  . Bursitis   . Gout   . Tinnitus   . Depression   . Bronchitis   . Heart murmur   . Anemia     since post op- knee replacement   . Complication of anesthesia     irritation in throat postop, makes a-fib worse   . DJD (degenerative joint disease)     OA- "everywhere"  . Anxiety     takes xanax 2 times per day, taken mostly for ringing in ears    . PONV (postoperative nausea and vomiting)   . Family history of anesthesia complication     sister with nausia and vomiting   Past Surgical History  Procedure Laterality Date  . Transesophageal echocardiogram  04/2008  . Transthoracic echocardiogram  2006,2009  . Bilteral thumb joint surgrery    . Right rotator cuff surgery  2006  . Cholecystectomy    . Rhinoplasty    . Uvulectomy    . Appendectomy    . Pacemaker insertion  06/06/2005    Medtronic EnRhythm dual-chamber. For sick sinus syndrome  . Right total hip arthroplasty    . Left rotator cuff repair    . 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 Tonsillectomy      as a child  . Joint replacement      L knee- 12/11,R hip- 1996  . Total knee revision  08/08/2011    Procedure: TOTAL KNEE REVISION;  Surgeon: Nestor Lewandowsky, MD;  Location: [facility] OR;  Service: Orthopedics;  Laterality: Left;  DEPUY/SIGMA  . Atrial fibrillation ablation  2009, 12/02/11    PVI by Dr Johney Frame x 2  . Tee without cardioversion  12/01/2011    Procedure: TRANSESOPHAGEAL ECHOCARDIOGRAM (TEE);  Surgeon: Pricilla Riffle, MD;  Location: [facility] ENDOSCOPY;  Service: Cardiovascular;  Laterality: N/A;  a-fib ablation following day   Family History  Problem Relation Age of Onset  . Coronary artery disease Mother   . Other Mother     Cerebrovascular Disease  . Cancer    . Anesthesia problems Neg Hx   . Hypotension Neg Hx   . Malignant hyperthermia Neg Hx   . Pseudochol deficiency Neg Hx    History  Substance Use Topics  . Smoking status: Never Smoker   . Smokeless tobacco: Never Used  . Alcohol Use: Yes     Comment: rare use    Review of Systems  Psychiatric/Behavioral:  Positive for suicidal ideas, sleep disturbance and dysphoric mood.  All other systems reviewed and are negative.    Allergies  Iohexol; Meperidine hcl; and Prednisone  Home Medications   Current Outpatient Rx  Name  Route  Sig  Dispense  Refill  . ALPRAZolam (XANAX) 0.5 MG tablet   Oral   Take 0.5 mg by mouth 3 (three) times daily as needed.          . ferrous sulfate 325 (65 FE) MG tablet   Oral   Take 325 mg by mouth daily with breakfast.          . FLUoxetine (PROZAC) 40 MG capsule   Oral   Take 40 mg by mouth daily.          . meloxicam (MOBIC) 7.5 MG tablet   Oral   Take 15 mg by mouth daily.          . pantoprazole (PROTONIX) 40 MG tablet   Oral   Take 40 mg by mouth daily.         . potassium chloride SA (K-DUR,KLOR-CON) 20 MEQ tablet   Oral   Take 20 mEq by mouth 2 (two) times daily.          . Probiotic Product (ALIGN) 4 MG  CAPS   Oral   Take 1 capsule by mouth daily.           . propranolol (INDERAL) 20 MG tablet   Oral   Take 20 mg by mouth 2 (two) times daily.         Marland Kitchen warfarin (COUMADIN) 2.5 MG tablet   Oral   Take 2.5 mg by mouth daily.          BP 143/93  Pulse 68  Temp(Src) 98.6 F (37 C) (Oral)  Resp 18  SpO2 97% Physical Exam  Nursing note and vitals reviewed. Constitutional: He is oriented to person, place, and time. He appears well-developed and well-nourished.  Depressed, tired, not lethargic   HENT:  Head: Normocephalic.  Mouth/Throat: Oropharynx is clear and moist.  Eyes: Conjunctivae are normal. Pupils are equal, round, and reactive to light.  Neck: Normal range of motion. Neck supple.  Cardiovascular: Normal rate, regular rhythm and normal heart sounds.   Pulmonary/Chest: Effort normal and breath sounds normal. No respiratory distress. He has no wheezes. He has no rales.  Abdominal: Soft. Bowel sounds are normal. He exhibits no distension. There is no tenderness. There is no rebound.  Musculoskeletal: Normal range of motion.  Neurological: He is alert and oriented to person, place, and time.  Skin: Skin is warm and dry.  Psychiatric:  Depressed, suicidal     ED Course  Procedures (including critical care time) Labs Review Labs Reviewed  COMPREHENSIVE METABOLIC PANEL - Abnormal; Notable for the following:    Sodium 133 (*)    GFR calc non Af Amer 87 (*)    All other components within normal limits  ETHANOL - Abnormal; Notable for the following:    Alcohol, Ethyl (B) 203 (*)    All other components within normal limits  SALICYLATE LEVEL - Abnormal; Notable for the following:    Salicylate Lvl <2.0 (*)    All other components within normal limits  PROTIME-INR - Abnormal; Notable for the following:    Prothrombin Time 22.1 (*)    INR 2.00 (*)    All other components within normal limits  CBC WITH DIFFERENTIAL  ACETAMINOPHEN LEVEL  URINE RAPID DRUG SCREEN (HOSP  PERFORMED)  URINALYSIS, ROUTINE W REFLEX MICROSCOPIC   Imaging Review No results found.  EKG Interpretation   None       MDM  No diagnosis found. [name] [name] is a 72 y.o. male here with suicidal attempt. Will observe in the ED overnight and contact TTS for eval.   1:17 AM UDS neg. Alcohol level 203. Will transfer to psych ED.   Richardean Canal, MD 03/25/13 [phone]

## 2013-03-25 ENCOUNTER — Inpatient Hospital Stay (HOSPITAL_COMMUNITY)
Admission: AD | Admit: 2013-03-25 | Discharge: 2013-03-28 | DRG: 885 | Disposition: A | Discharge status: Home / Self Care | Payer: Medicare Other | Source: Intra-hospital | Attending: Emergency Medicine | Admitting: Emergency Medicine

## 2013-03-25 ENCOUNTER — Encounter (HOSPITAL_COMMUNITY): Payer: Self-pay | Admitting: Intensive Care

## 2013-03-25 DIAGNOSIS — R519 Headache, unspecified: Secondary | ICD-10-CM

## 2013-03-25 DIAGNOSIS — F332 Major depressive disorder, recurrent severe without psychotic features: Principal | ICD-10-CM

## 2013-03-25 DIAGNOSIS — R51 Headache: Secondary | ICD-10-CM

## 2013-03-25 DIAGNOSIS — I10 Essential (primary) hypertension: Secondary | ICD-10-CM

## 2013-03-25 DIAGNOSIS — Z7901 Long term (current) use of anticoagulants: Secondary | ICD-10-CM

## 2013-03-25 DIAGNOSIS — R45851 Suicidal ideations: Secondary | ICD-10-CM

## 2013-03-25 DIAGNOSIS — F411 Generalized anxiety disorder: Secondary | ICD-10-CM | POA: Diagnosis present

## 2013-03-25 DIAGNOSIS — Z950 Presence of cardiac pacemaker: Secondary | ICD-10-CM

## 2013-03-25 DIAGNOSIS — I4891 Unspecified atrial fibrillation: Secondary | ICD-10-CM

## 2013-03-25 DIAGNOSIS — Z79899 Other long term (current) drug therapy: Secondary | ICD-10-CM

## 2013-03-25 LAB — CBC WITH DIFFERENTIAL/PLATELET
Basophils Absolute: 0.1 10*3/uL (ref 0.0–0.1)
Eosinophils Absolute: 0.3 10*3/uL (ref 0.0–0.7)
Eosinophils Relative: 4 % (ref 0–5)
HCT: 39.3 % (ref 39.0–52.0)
Lymphocytes Relative: 36 % (ref 12–46)
Lymphs Abs: 2.5 10*3/uL (ref 0.7–4.0)
MCH: 30.2 pg (ref 26.0–34.0)
MCHC: 35.4 g/dL (ref 30.0–36.0)
MCV: 85.4 fL (ref 78.0–100.0)
Monocytes Absolute: 0.5 10*3/uL (ref 0.1–1.0)
Neutro Abs: 3.8 10*3/uL (ref 1.7–7.7)
Platelets: 176 10*3/uL (ref 150–400)
RBC: 4.6 MIL/uL (ref 4.22–5.81)
RDW: 13 % (ref 11.5–15.5)
WBC: 7.1 10*3/uL (ref 4.0–10.5)

## 2013-03-25 LAB — URINALYSIS, ROUTINE W REFLEX MICROSCOPIC
Bilirubin Urine: NEGATIVE
Ketones, ur: NEGATIVE mg/dL
Leukocytes, UA: NEGATIVE
Nitrite: NEGATIVE
Protein, ur: NEGATIVE mg/dL
Specific Gravity, Urine: 1.013 (ref 1.005–1.030)
Urobilinogen, UA: 0.2 mg/dL (ref 0.0–1.0)
pH: 6 (ref 5.0–8.0)

## 2013-03-25 LAB — COMPREHENSIVE METABOLIC PANEL
ALT: 16 U/L (ref 0–53)
AST: 22 U/L (ref 0–37)
BUN: 10 mg/dL (ref 6–23)
CO2: 29 mEq/L (ref 19–32)
Calcium: 9 mg/dL (ref 8.4–10.5)
Chloride: 96 mEq/L (ref 96–112)
Creatinine, Ser: 0.79 mg/dL (ref 0.50–1.35)
GFR calc Af Amer: >90 mL/min (ref >90)
Glucose, Bld: 97 mg/dL (ref 70–99)
Sodium: 133 mEq/L — ABNORMAL LOW (ref 135–145)
Total Protein: 6.7 g/dL (ref 6.0–8.3)

## 2013-03-25 LAB — PROTIME-INR
INR: 2 — ABNORMAL HIGH (ref 0.00–1.49)
Prothrombin Time: 22.1 seconds — ABNORMAL HIGH (ref 11.6–15.2)

## 2013-03-25 LAB — ETHANOL: Alcohol, Ethyl (B): 203 mg/dL — ABNORMAL HIGH (ref 0–11)

## 2013-03-25 LAB — ACETAMINOPHEN LEVEL: Acetaminophen (Tylenol), Serum: <15 ug/mL (ref 10–30)

## 2013-03-25 LAB — SALICYLATE LEVEL: Salicylate Lvl: <2 mg/dL — ABNORMAL LOW (ref 2.8–20.0)

## 2013-03-25 MED ORDER — PROPRANOLOL HCL 20 MG PO TABS
20.0000 mg | ORAL_TABLET | Freq: Two times a day (BID) | ORAL | Status: DC
  Administered 2013-03-25: 20 mg via ORAL
  Filled 2013-03-25 (×3): qty 1

## 2013-03-25 MED ORDER — POTASSIUM CHLORIDE CRYS ER 20 MEQ PO TBCR
20.0000 meq | EXTENDED_RELEASE_TABLET | Freq: Two times a day (BID) | ORAL | Status: DC
  Administered 2013-03-25 – 2013-03-28 (×5): 20 meq via ORAL
  Filled 2013-03-25 (×13): qty 1

## 2013-03-25 MED ORDER — LORAZEPAM 1 MG PO TABS
1.0000 mg | ORAL_TABLET | Freq: Three times a day (TID) | ORAL | Status: DC | PRN
  Administered 2013-03-25: 1 mg via ORAL
  Filled 2013-03-25: qty 1

## 2013-03-25 MED ORDER — ACETAMINOPHEN 325 MG PO TABS: 650.0000 mg | ORAL_TABLET | ORAL | Status: DC | PRN

## 2013-03-25 MED ORDER — ALUM & MAG HYDROXIDE-SIMETH 200-200-20 MG/5ML PO SUSP: 30.0000 mL | ORAL | Status: DC | PRN

## 2013-03-25 MED ORDER — LORAZEPAM 1 MG PO TABS
1.0000 mg | ORAL_TABLET | Freq: Four times a day (QID) | ORAL | Status: DC | PRN
  Administered 2013-03-26: 1 mg via ORAL
  Filled 2013-03-25: qty 1

## 2013-03-25 MED ORDER — FERROUS SULFATE 325 (65 FE) MG PO TABS
325.0000 mg | ORAL_TABLET | Freq: Every day | ORAL | Status: DC
  Administered 2013-03-26 – 2013-03-28 (×3): 325 mg via ORAL
  Filled 2013-03-25 (×6): qty 1

## 2013-03-25 MED ORDER — ACETAMINOPHEN 325 MG PO TABS: 650.0000 mg | ORAL_TABLET | Freq: Four times a day (QID) | ORAL | Status: DC | PRN

## 2013-03-25 MED ORDER — POTASSIUM CHLORIDE CRYS ER 20 MEQ PO TBCR
20.0000 meq | EXTENDED_RELEASE_TABLET | Freq: Two times a day (BID) | ORAL | Status: DC
  Administered 2013-03-25: 20 meq via ORAL
  Filled 2013-03-25: qty 1

## 2013-03-25 MED ORDER — WARFARIN - PHYSICIAN DOSING INPATIENT
Freq: Every day | Status: DC
  Filled 2013-03-25 (×9): qty 1

## 2013-03-25 MED ORDER — MAGNESIUM HYDROXIDE 400 MG/5ML PO SUSP: 30.0000 mL | Freq: Every day | ORAL | Status: DC | PRN

## 2013-03-25 MED ORDER — WARFARIN SODIUM 2.5 MG PO TABS
2.5000 mg | ORAL_TABLET | Freq: Every day | ORAL | Status: DC
  Filled 2013-03-25 (×2): qty 1

## 2013-03-25 MED ORDER — ACETAMINOPHEN 325 MG PO TABS
650.0000 mg | ORAL_TABLET | ORAL | Status: DC | PRN
  Administered 2013-03-25: 650 mg via ORAL
  Filled 2013-03-25: qty 2

## 2013-03-25 MED ORDER — WARFARIN - PHYSICIAN DOSING INPATIENT: Freq: Every day | Status: DC

## 2013-03-25 MED ORDER — PROPRANOLOL HCL 20 MG PO TABS
20.0000 mg | ORAL_TABLET | Freq: Two times a day (BID) | ORAL | Status: DC
  Administered 2013-03-25 – 2013-03-28 (×6): 20 mg via ORAL
  Filled 2013-03-25 (×13): qty 1

## 2013-03-25 MED ORDER — LORAZEPAM 1 MG PO TABS
ORAL_TABLET | ORAL | Status: AC
  Administered 2013-03-25: 1 mg
  Filled 2013-03-25: qty 1

## 2013-03-25 MED ORDER — WARFARIN SODIUM 2.5 MG PO TABS
2.5000 mg | ORAL_TABLET | Freq: Every day | ORAL | Status: DC
  Filled 2013-03-25: qty 1

## 2013-03-25 MED ORDER — PANTOPRAZOLE SODIUM 40 MG PO TBEC
40.0000 mg | DELAYED_RELEASE_TABLET | Freq: Every day | ORAL | Status: DC
  Administered 2013-03-25: 40 mg via ORAL
  Filled 2013-03-25: qty 1

## 2013-03-25 MED ORDER — IBUPROFEN 200 MG PO TABS: 600.0000 mg | ORAL_TABLET | Freq: Three times a day (TID) | ORAL | Status: DC | PRN

## 2013-03-25 MED ORDER — FLUOXETINE HCL 20 MG PO CAPS
20.0000 mg | ORAL_CAPSULE | Freq: Every day | ORAL | Status: DC
  Administered 2013-03-25: 20 mg via ORAL
  Filled 2013-03-25: qty 1

## 2013-03-25 MED ORDER — PANTOPRAZOLE SODIUM 40 MG PO TBEC
40.0000 mg | DELAYED_RELEASE_TABLET | Freq: Every day | ORAL | Status: DC
  Administered 2013-03-26 – 2013-03-28 (×3): 40 mg via ORAL
  Filled 2013-03-25 (×6): qty 1

## 2013-03-25 MED ORDER — FERROUS SULFATE 325 (65 FE) MG PO TABS
325.0000 mg | ORAL_TABLET | Freq: Every day | ORAL | Status: DC
  Administered 2013-03-25: 325 mg via ORAL
  Filled 2013-03-25 (×2): qty 1

## 2013-03-25 MED ORDER — FLUOXETINE HCL 20 MG PO CAPS
20.0000 mg | ORAL_CAPSULE | Freq: Every day | ORAL | Status: DC
  Administered 2013-03-26: 20 mg via ORAL
  Filled 2013-03-25 (×2): qty 1

## 2013-03-25 NOTE — ED Notes (Addendum)
Pt's son, Markham Dumlao, [phone], Pt's wife, Britta Mccreedy: [phone]

## 2013-03-25 NOTE — Tx Team (Signed)
Initial Interdisciplinary Treatment Plan  PATIENT STRENGTHS: (choose at least two) Ability for insight Active sense of humor Average or above average intelligence Capable of independent living Communication skills Financial means General fund of knowledge  PATIENT STRESSORS: Health problems Marital or family conflict Medication change or noncompliance Substance abuse Traumatic event   PROBLEM LIST: Problem List/Patient Goals Date to be addressed Date deferred Reason deferred Estimated date of resolution  Depression 03/25/13                                                      DISCHARGE CRITERIA:  Ability to meet basic life and health needs Adequate post-discharge living arrangements Improved stabilization in mood, thinking, and/or behavior Medical problems require only outpatient monitoring  PRELIMINARY DISCHARGE PLAN: Attend aftercare/continuing care group Outpatient therapy  PATIENT/FAMIILY INVOLVEMENT: This treatment plan has been presented to and reviewed with the patient, Leveda Anna.  The patient has been given the opportunity to ask questions and make suggestions.  Nestor Ramp MCCOLLUM 03/25/2013, 2:42 PM

## 2013-03-25 NOTE — ED Notes (Signed)
Handoff to [place]. RN [facility]. Pt is awake and alert, pleasant and cooperative. Patient denies HI, SI AH or VH. Discharge vitals 116/73 HR 104 RR 16 and unlabored. Pt has inpatient treatment scheduled at [facility]. Will continue to monitor for safety. Patient escorted to lobby with [facility] transport incident. T.Melvyn Neth RN

## 2013-03-25 NOTE — Progress Notes (Signed)
Patient ID: [name] [name], male   DOB: 1940/06/22, 72 y.o.   MRN: [id number] D: Patient in room on approach. Pt stated he was upset about the death of his cat but not suicidal. Pt stated the whole issue was a misunderstanding between himself and his wife who taught he was trying to hurt himself. Pt stated he hopes to discharge tomorrow because he has a lot of activities planned at his church. Pt denies SI/HI/AVH and pain. Pt attended evening wrap up group and engaged in discussions. Pt denies any needs or concerns.  Cooperative with assessment. No acute distressed noted at this time.   A: Met with pt 1:1. Medications administered as prescribed. Writer encouraged pt to discuss feelings. Pt encouraged to come to staff with any question or concerns.   R: Patient remains safe. He is complaint with medications and denies any adverse reaction. Continue current POC.

## 2013-03-25 NOTE — [facility] Counselor (Signed)
This Clinical research associate contacted Dr. Emily Filbert) for clinicals and advised of disposition, pt will be seen by AM psych for final disposition.

## 2013-03-25 NOTE — Progress Notes (Signed)
Adult Psychoeducational Group Note  Date:  03/25/2013 Time:  8:00pm Group Topic/Focus:  Wrap-Up Group:   The focus of this group is to help patients review their daily goal of treatment and discuss progress on daily workbooks.  Participation Level:  Active  Participation Quality:  Appropriate and Attentive  Affect:  Appropriate  Cognitive:  Alert and Appropriate  Insight: Appropriate  Engagement in Group:  Engaged  Modes of Intervention:  Discussion and Education  Additional Comments:  Pt attended and participated in group. Writer explained to each patient about 15 minute checks, environmental, and keeping the noise level at a minimal. Pt was asked How his day went? Pt stated his day was a lot better than yesterday. Pt stated he was severely depressed over the loss of his cat.   Shelly Bombard D 03/25/2013, 9:14 PM

## 2013-03-25 NOTE — Consult Note (Signed)
[facility] Face-to-Face Psychiatry Consult   Reason for Consult:  Depression Referring Physician:  EDP MASARU [name] is an 72 y.o. male.  Assessment: AXIS I:  Major Depression, Recurrent severe AXIS II:  Deferred AXIS III:   Past Medical History  Diagnosis Date  . Paroxysmal atrial fibrillation   . HTN (hypertension)   . Hyperlipidemia   . Migraines   . Arthritis of knee, right     end-stage  . Bursitis   . Gout   . Tinnitus   . Depression   . Bronchitis   . Heart murmur   . Anemia     since post op- knee replacement   . Complication of anesthesia     irritation in throat postop, makes a-fib worse   . DJD (degenerative joint disease)     OA- "everywhere"  . Anxiety     takes xanax 2 times per day, taken mostly for ringing in ears    . PONV (postoperative nausea and vomiting)   . Family history of anesthesia complication     sister with nausia and vomiting   AXIS IV:  other psychosocial or environmental problems and problems related to social environment AXIS V:  51-60 moderate symptoms  Plan:  Recommend psychiatric Inpatient admission when medically cleared.  Subjective:   [name] [name] is a 72 y.o. male patient admitted with Recurrent Major depression.  HPI: Caucasian male   72 years old who presents voluntarily with Depression/SI. Pt reports a long history of depression for "20 yrs". Pt attempted to harm self by overdosing on approx 10-12 0.5mg  of Xanax, 40mg  of Prozac and drinking 7-8 beers. Upon arrival to psych ed, pt told nurse that he a plan to shoot self in the chest. Pt denies HI/AVH. Pt told medical staff that he's been depressed for several days, however today his cat was ran over by a vehicle and he felt  life was not worth living. During the interview, pt tells this Clinical research associate that he's no longer SI and feels safe returning home. Pt currently resides with spouse and granddaughter.  He also reports a lot of stress from living with his 42 year old grand daughter in the  house.  Patient was tearful during the interview and is agreeable to coming in to the hospital for treatment of depression.  Patient admitted that he took these medications and beer yesterday to end his life but today he does not feel that way.  He states he and his wife are happily married and that the only issue in the house is the 74 year old grand daughter.  We will admit him to our inpatient unit for treatment of depression.  He reports poor sleep but good appetite.  We will admit him to our inpatient mood d/o unit.  We will continue his home medications and plan to add more medications if needed.    HPI Elements:   Location:  [facility]. Quality:  Severe to the extent of OD on medication. Severity:  Severe. Duration:  20 plus years. Context:   Cat died yesterday, stress from 75 year old grand daughter.  Past Psychiatric History: Past Medical History  Diagnosis Date  . Paroxysmal atrial fibrillation   . HTN (hypertension)   . Hyperlipidemia   . Migraines   . Arthritis of knee, right     end-stage  . Bursitis   . Gout   . Tinnitus   . Depression   . Bronchitis   . Heart murmur   .  Anemia     since post op- knee replacement   . Complication of anesthesia     irritation in throat postop, makes a-fib worse   . DJD (degenerative joint disease)     OA- "everywhere"  . Anxiety     takes xanax 2 times per day, taken mostly for ringing in ears    . PONV (postoperative nausea and vomiting)   . Family history of anesthesia complication     sister with nausia and vomiting    reports that he has never smoked. He has never used smokeless tobacco. He reports that he drinks alcohol. He reports that he does not use illicit drugs. Family History  Problem Relation Age of Onset  . Coronary artery disease Mother   . Other Mother     Cerebrovascular Disease  . Cancer    . Anesthesia problems Neg Hx   . Hypotension Neg Hx   . Malignant hyperthermia Neg Hx   . Pseudochol deficiency Neg Hx     Family History Substance Abuse: No Family Supports: Yes, List: (Spouse ) Living Arrangements: Spouse/significant other;Children (Grandchild in the home ) Can pt return to current living arrangement?: Yes Abuse/Neglect [facility]) Physical Abuse: Denies Verbal Abuse: Denies Sexual Abuse: Denies Allergies:   Allergies  Allergen Reactions  . Iohexol      Code: HIVES, Desc: POST MYELOGRAM.  PT ALSO SAID THIS HAPPENED A YEAR AGO WITH A MYELOGRAM AND WITH ESI'S., Onset Date: [id number]   . Meperidine Hcl     REACTION: unspecified  . Prednisone     Redness of skin & jittery    ACT Assessment Complete:  Yes:    Educational Status    Risk to Self: Risk to self Suicidal Ideation: No-Not Currently/Within Last 6 Months Suicidal Intent: No-Not Currently/Within Last 6 Months Is patient at risk for suicide?: Yes Suicidal Plan?: No-Not Currently/Within Last 6 Months Access to Means: Yes Specify Access to Suicidal Means: Pills, Sharps, Gun What has been your use of drugs/alcohol within the last 12 months?: Abusing alcohol(03/26/2013) Previous Attempts/Gestures: No How many times?: 0 Other Self Harm Risks: None  Triggers for Past Attempts: None known Intentional Self Injurious Behavior: None Family Suicide History: No Recent stressful life event(s): Loss (Comment) (Cat died(03-26-13); granddaughter in the home; son has legal) Persecutory voices/beliefs?: No Depression: Yes Depression Symptoms: Loss of interest in usual pleasures;Feeling worthless/self pity;Despondent;Insomnia Substance abuse history and/or treatment for substance abuse?: No Suicide prevention information given to non-admitted patients: Not applicable  Risk to Others: Risk to Others Homicidal Ideation: No Thoughts of Harm to Others: No Current Homicidal Intent: No Current Homicidal Plan: No Access to Homicidal Means: No Identified Victim: None  History of harm to others?: No Assessment of Violence: None Noted Violent  Behavior Description: None  Does patient have access to weapons?: No Criminal Charges Pending?: No Does patient have a court date: No  Abuse: Abuse/Neglect Assessment (Assessment to be complete while patient is alone) Physical Abuse: Denies Verbal Abuse: Denies Sexual Abuse: Denies Exploitation of patient/patient's resources: Denies Self-Neglect: Denies  Prior Inpatient Therapy: Prior Inpatient Therapy Prior Inpatient Therapy: No Prior Therapy Dates: None  Prior Therapy Facilty/Provider(s): None  Reason for Treatment: None   Prior Outpatient Therapy: Prior Outpatient Therapy Prior Outpatient Therapy: Yes Prior Therapy Dates: Current  Prior Therapy Facilty/Provider(s): Dr. Betti Cruz  Reason for Treatment: Med Mgt   Additional Information: Additional Information 1:1 In Past 12 Months?: No CIRT Risk: No Elopement Risk: No Does patient have medical clearance?: Yes  Objective: Blood pressure 98/61, pulse 60, temperature 97.4 F (36.3 [name] [name]), temperature source Oral, resp. rate 18, SpO2 94.00%.There is no weight on file to calculate BMI. Results for orders placed during the hospital encounter of 03/24/13 (from the past 72 hour(s))  URINE RAPID DRUG SCREEN (HOSP PERFORMED)     Status: None   Collection Time    03/24/13 11:33 PM      Result Value Range   Opiates NONE DETECTED  NONE DETECTED   Cocaine NONE DETECTED  NONE DETECTED   Benzodiazepines NONE DETECTED  NONE DETECTED   Amphetamines NONE DETECTED  NONE DETECTED   Tetrahydrocannabinol NONE DETECTED  NONE DETECTED   Barbiturates NONE DETECTED  NONE DETECTED   Comment:            DRUG SCREEN FOR MEDICAL PURPOSES     ONLY.  IF CONFIRMATION IS NEEDED     FOR ANY PURPOSE, NOTIFY LAB     WITHIN 5 DAYS.                LOWEST DETECTABLE LIMITS     FOR URINE DRUG SCREEN     Drug Class       Cutoff (ng/mL)     Amphetamine      1000     Barbiturate      200     Benzodiazepine   200     Tricyclics        300     Opiates          300     Cocaine          300     THC              50  URINALYSIS, ROUTINE W REFLEX MICROSCOPIC     Status: None   Collection Time    03/24/13 11:33 PM      Result Value Range   Color, Urine YELLOW  YELLOW   APPearance CLEAR  CLEAR   Specific Gravity, Urine 1.013  1.005 - 1.030   pH 6.0  5.0 - 8.0   Glucose, UA NEGATIVE  NEGATIVE mg/dL   Hgb urine dipstick NEGATIVE  NEGATIVE   Bilirubin Urine NEGATIVE  NEGATIVE   Ketones, ur NEGATIVE  NEGATIVE mg/dL   Protein, ur NEGATIVE  NEGATIVE mg/dL   Urobilinogen, UA 0.2  0.0 - 1.0 mg/dL   Nitrite NEGATIVE  NEGATIVE   Leukocytes, UA NEGATIVE  NEGATIVE   Comment: MICROSCOPIC NOT DONE ON URINES WITH NEGATIVE PROTEIN, BLOOD, LEUKOCYTES, NITRITE, OR GLUCOSE <1000 mg/dL.  CBC WITH DIFFERENTIAL     Status: None   Collection Time    03/25/13 12:05 AM      Result Value Range   WBC 7.1  4.0 - 10.5 K/uL   RBC 4.60  4.22 - 5.81 MIL/uL   Hemoglobin 13.9  13.0 - 17.0 g/dL   HCT 16.1  09.6 - 04.5 %   MCV 85.4  78.0 - 100.0 fL   MCH 30.2  26.0 - 34.0 pg   MCHC 35.4  30.0 - 36.0 g/dL   RDW 40.9  81.1 - 91.4 %   Platelets 176  150 - 400 K/uL   Neutrophils Relative % 53  43 - 77 %   Neutro Abs 3.8  1.7 - 7.7 K/uL   Lymphocytes Relative 36  12 - 46 %   Lymphs Abs 2.5  0.7 - 4.0 K/uL   Monocytes Relative 7  3 - 12 %  Monocytes Absolute 0.5  0.1 - 1.0 K/uL   Eosinophils Relative 4  0 - 5 %   Eosinophils Absolute 0.3  0.0 - 0.7 K/uL   Basophils Relative 1  0 - 1 %   Basophils Absolute 0.1  0.0 - 0.1 K/uL  COMPREHENSIVE METABOLIC PANEL     Status: Abnormal   Collection Time    03/25/13 12:05 AM      Result Value Range   Sodium 133 (*) 135 - 145 mEq/L   Potassium 4.0  3.5 - 5.1 mEq/L   Chloride 96  96 - 112 mEq/L   CO2 29  19 - 32 mEq/L   Glucose, Bld 97  70 - 99 mg/dL   BUN 10  6 - 23 mg/dL   Creatinine, Ser 1.19  0.50 - 1.35 mg/dL   Calcium 9.0  8.4 - 14.7 mg/dL   Total Protein 6.7  6.0 - 8.3 g/dL   Albumin 3.8  3.5 -  5.2 g/dL   AST 22  0 - 37 U/L   ALT 16  0 - 53 U/L   Alkaline Phosphatase 59  39 - 117 U/L   Total Bilirubin 0.4  0.3 - 1.2 mg/dL   GFR calc non Af Amer 87 (*) >90 mL/min   GFR calc Af Amer >90  >90 mL/min   Comment: (NOTE)     The eGFR has been calculated using the CKD EPI equation.     This calculation has not been validated in all clinical situations.     eGFR's persistently <90 mL/min signify possible Chronic Kidney     Disease.  ETHANOL     Status: Abnormal   Collection Time    03/25/13 12:05 AM      Result Value Range   Alcohol, Ethyl (B) 203 (*) 0 - 11 mg/dL   Comment:            LOWEST DETECTABLE LIMIT FOR     SERUM ALCOHOL IS 11 mg/dL     FOR MEDICAL PURPOSES ONLY  SALICYLATE LEVEL     Status: Abnormal   Collection Time    03/25/13 12:05 AM      Result Value Range   Salicylate Lvl <2.0 (*) 2.8 - 20.0 mg/dL  ACETAMINOPHEN LEVEL     Status: None   Collection Time    03/25/13 12:05 AM      Result Value Range   Acetaminophen (Tylenol), Serum <15.0  10 - 30 ug/mL   Comment:            THERAPEUTIC CONCENTRATIONS VARY     SIGNIFICANTLY. A RANGE OF 10-30     ug/mL MAY BE AN EFFECTIVE     CONCENTRATION FOR MANY PATIENTS.     HOWEVER, SOME ARE BEST TREATED     AT CONCENTRATIONS OUTSIDE THIS     RANGE.     ACETAMINOPHEN CONCENTRATIONS     >150 ug/mL AT 4 HOURS AFTER     INGESTION AND >50 ug/mL AT 12     HOURS AFTER INGESTION ARE     OFTEN ASSOCIATED WITH TOXIC     REACTIONS.  PROTIME-INR     Status: Abnormal   Collection Time    03/25/13 12:05 AM      Result Value Range   Prothrombin Time 22.1 (*) 11.6 - 15.2 seconds   INR 2.00 (*) 0.00 - 1.49  PROTIME-INR     Status: Abnormal   Collection Time    03/25/13  6:45 AM  Result Value Range   Prothrombin Time 23.4 (*) 11.6 - 15.2 seconds   INR 2.16 (*) 0.00 - 1.49   Labs are reviewed and are pertinent for Alcohol level 203 and NA 133, PT/INR 23.4 AND 216 respectively..  Current Facility-Administered Medications   Medication Dose Route Frequency Provider Last Rate Last Dose  . acetaminophen (TYLENOL) tablet 650 mg  650 mg Oral Q4H PRN Richardean Canal, MD      . ferrous sulfate tablet 325 mg  325 mg Oral Q breakfast Richardean Canal, MD   325 mg at 03/25/13 0835  . ibuprofen (ADVIL,MOTRIN) tablet 600 mg  600 mg Oral Q8H PRN Richardean Canal, MD      . LORazepam (ATIVAN) tablet 1 mg  1 mg Oral Q8H PRN Richardean Canal, MD   1 mg at 03/25/13 0201  . pantoprazole (PROTONIX) EC tablet 40 mg  40 mg Oral Daily Richardean Canal, MD      . potassium chloride SA (K-DUR,KLOR-CON) CR tablet 20 mEq  20 mEq Oral BID Richardean Canal, MD      . propranolol (INDERAL) tablet 20 mg  20 mg Oral BID Richardean Canal, MD      . warfarin (COUMADIN) tablet 2.5 mg  2.5 mg Oral q1800 Richardean Canal, MD      . Warfarin - Physician Dosing Inpatient   Does not apply [id number] Richardean Canal, MD       Current Outpatient Prescriptions  Medication Sig Dispense Refill  . ALPRAZolam (XANAX) 0.5 MG tablet Take 0.5 mg by mouth 3 (three) times daily as needed.       . ferrous sulfate 325 (65 FE) MG tablet Take 325 mg by mouth daily with breakfast.       . FLUoxetine (PROZAC) 40 MG capsule Take 40 mg by mouth daily.       . meloxicam (MOBIC) 7.5 MG tablet Take 15 mg by mouth daily.       . pantoprazole (PROTONIX) 40 MG tablet Take 40 mg by mouth daily.      . potassium chloride SA (K-DUR,KLOR-CON) 20 MEQ tablet Take 20 mEq by mouth 2 (two) times daily.       . Probiotic Product (ALIGN) 4 MG CAPS Take 1 capsule by mouth daily.        . propranolol (INDERAL) 20 MG tablet Take 20 mg by mouth 2 (two) times daily.      Marland Kitchen warfarin (COUMADIN) 2.5 MG tablet Take 2.5 mg by mouth daily.        Psychiatric Specialty Exam:     Blood pressure 98/61, pulse 60, temperature 97.4 F (36.3 [name] [name]), temperature source Oral, resp. rate 18, SpO2 94.00%.There is no weight on file to calculate BMI.  General Appearance: Casual and Fairly Groomed  Eye Contact::  Good  Speech:  Clear and Coherent and  Normal Rate  Volume:  Normal  Mood:  Depressed, Hopeless and Worthless  Affect:  Appropriate, Congruent, Depressed and Flat  Thought Process:  Coherent and Intact  Orientation:  Full (Time, Place, and Person)  Thought Content:  NA  Suicidal Thoughts:  No  Homicidal Thoughts:  No  Memory:  Immediate;   Good Recent;   Good Remote;   Good  Judgement:  Poor  Insight:  Good  Psychomotor Activity:  Normal  Concentration:  Good  Recall:  NA  Akathisia:  NA  Handed:  Right  AIMS (if indicated):     Assets:  Desire for Improvement  Sleep:      Treatment Plan Summary:   Consult and face to face interview with Dr Ladona Ridgel We will admit patient for safety and stabilization We will resume both his medical and Psychiatric medications. We will encourage him to participate in group and individual group sessions to learn new coping skills. Daily contact with patient to assess and evaluate symptoms and progress in treatment Medication management  [name] [name], [name] [name]  PMHNP-BC 03/25/2013 9:30 AM

## 2013-03-25 NOTE — Progress Notes (Signed)
Pt accepted to [facility] pending bed availability.   Catha Gosselin, LCSW [phone]  ED CSW 03/25/2013. [phone]

## 2013-03-25 NOTE — ED Notes (Signed)
Pt transferred from main ed, presents with depression, SI, plan to shoot self in chest.  Pt ingested several beers & took 8-10 Xanax pills earlier tonight.  Pt reports he was upset about his favorite cat being killed and he had to bury him. Feeling hopeless at present.  Pt reports his Psychiatrist is Dr Betti Cruz, diag. With severe depression in the past.  Pt cooperative but anxious.

## 2013-03-25 NOTE — ED Notes (Signed)
Family member took belongings home with them

## 2013-03-25 NOTE — ED Notes (Signed)
[name] [name] (wife) left her home phone number [phone] and her son's cell phone number Fouad Taul [phone]

## 2013-03-25 NOTE — BH Assessment (Signed)
Assessment Note  [name] [name] is a 72 y.o. male who presents voluntarily with Depression/SI.  Pt reports a long history of depression for "20 yrs".  Pt attempted to harm self by overdosing on approx 10-12 0.5mg  of Xanax, 40mg  of Prozac and drinking 7-8 beers.  Upon arrival to psych ed, pt told nurse that he a plan to shoot self in the chest. Pt denies HI/AVH.  Pt told medical staff that he's been depressed for several days, however today his cat died and he that life was not worth living. During the interview, pt tells this Clinical research associate that he's no longer SI and feels safe returning home.  Pt currently resides with spouse and granddaughter.    Pt reports other stressors: (1) 72 yr old granddaughter in the home and she is having problems and (2) pt.'s younger son has legal problems.  Pt describes his depression as a "a big cloud over my head, I just feel blue". He also endorses insomnia. Pt is compliant with medications and says they are helping him--sees Dr. Betti Cruz every 6 mos for med mgt.  Pt has no past inpt admissions.  Pt admits that he has problems with short term memory loss.     Axis I: Major Depression, Recurrent severe Axis II: Deferred Axis III:  Past Medical History  Diagnosis Date  . Paroxysmal atrial fibrillation   . HTN (hypertension)   . Hyperlipidemia   . Migraines   . Arthritis of knee, right     end-stage  . Bursitis   . Gout   . Tinnitus   . Depression   . Bronchitis   . Heart murmur   . Anemia     since post op- knee replacement   . Complication of anesthesia     irritation in throat postop, makes a-fib worse   . DJD (degenerative joint disease)     OA- "everywhere"  . Anxiety     takes xanax 2 times per day, taken mostly for ringing in ears    . PONV (postoperative nausea and vomiting)   . Family history of anesthesia complication     sister with nausia and vomiting   Axis IV: other psychosocial or environmental problems and problems related to social  environment Axis V: 31-40 impairment in reality testing  Past Medical History:  Past Medical History  Diagnosis Date  . Paroxysmal atrial fibrillation   . HTN (hypertension)   . Hyperlipidemia   . Migraines   . Arthritis of knee, right     end-stage  . Bursitis   . Gout   . Tinnitus   . Depression   . Bronchitis   . Heart murmur   . Anemia     since post op- knee replacement   . Complication of anesthesia     irritation in throat postop, makes a-fib worse   . DJD (degenerative joint disease)     OA- "everywhere"  . Anxiety     takes xanax 2 times per day, taken mostly for ringing in ears    . PONV (postoperative nausea and vomiting)   . Family history of anesthesia complication     sister with nausia and vomiting    Past Surgical History  Procedure Laterality Date  . Transesophageal echocardiogram  04/2008  . Transthoracic echocardiogram  2006,2009  . Bilteral thumb joint surgrery    . Right rotator cuff surgery  2006  . Cholecystectomy    . Rhinoplasty    . Uvulectomy    .  Appendectomy    . Pacemaker insertion  06/06/2005    Medtronic EnRhythm dual-chamber. For sick sinus syndrome  . Right total hip arthroplasty    . Left rotator cuff repair    . Cardiac catheterization  2005     which revealed 40% stenosis of the mid left anterior descending artery., ablation- 2009  . Tonsillectomy      as a child  . Joint replacement      L knee- 12/11,R hip- 1996  . Total knee revision  08/08/2011    Procedure: TOTAL KNEE REVISION;  Surgeon: Nestor Lewandowsky, MD;  Location: [facility] OR;  Service: Orthopedics;  Laterality: Left;  DEPUY/SIGMA  . Atrial fibrillation ablation  2009, 12/02/11    PVI by Dr Johney Frame x 2  . Tee without cardioversion  12/01/2011    Procedure: TRANSESOPHAGEAL ECHOCARDIOGRAM (TEE);  Surgeon: Pricilla Riffle, MD;  Location: [facility] ENDOSCOPY;  Service: Cardiovascular;  Laterality: N/A;  a-fib ablation following day    Family History:  Family History  Problem Relation Age  of Onset  . Coronary artery disease Mother   . Other Mother     Cerebrovascular Disease  . Cancer    . Anesthesia problems Neg Hx   . Hypotension Neg Hx   . Malignant hyperthermia Neg Hx   . Pseudochol deficiency Neg Hx     Social History:  reports that he has never smoked. He has never used smokeless tobacco. He reports that he drinks alcohol. He reports that he does not use illicit drugs.  Additional Social History:  Alcohol / Drug Use Pain Medications: See MAR  Prescriptions: See MAR  Over the Counter: See MAR  History of alcohol / drug use?: Yes Longest period of sobriety (when/how long): Pt reports occasional use of alcohol--ingested 7-8 beers on 03/25/13 Withdrawal Symptoms: Other (Comment) (No current w/d sxs )  CIWA: CIWA-Ar BP: 98/61 mmHg Pulse Rate: 60 COWS:    Allergies:  Allergies  Allergen Reactions  . Iohexol      Code: HIVES, Desc: POST MYELOGRAM.  PT ALSO SAID THIS HAPPENED A YEAR AGO WITH A MYELOGRAM AND WITH ESI'S., Onset Date: [id number]   . Meperidine Hcl     REACTION: unspecified  . Prednisone     Redness of skin & jittery    Home Medications:  (Not in a hospital admission)  OB/GYN Status:  No LMP for male patient.  General Assessment Data Location of Assessment: [facility] Is this a Tele or Face-to-Face Assessment?: Tele Assessment Is this an Initial Assessment or a Re-assessment for this encounter?: Initial Assessment Living Arrangements: Spouse/significant other;Children (Grandchild in the home ) Can pt return to current living arrangement?: Yes Admission Status: Voluntary Is patient capable of signing voluntary admission?: Yes Transfer from: Acute Hospital Referral Source: MD  Medical Screening Exam [facility] Walk-in ONLY) Medical Exam completed: No Reason for MSE not completed: Other: (None )  [facility] Crisis Care Plan Living Arrangements: Spouse/significant other;Children (Grandchild in the home ) Name of Psychiatrist: Dr. Betti Cruz  Name of  Therapist: None   Education Status Is patient currently in school?: No Current Grade: None  Highest grade of school patient has completed: None  Name of school: None  Contact person: None   Risk to self Suicidal Ideation: No-Not Currently/Within Last 6 Months Suicidal Intent: No-Not Currently/Within Last 6 Months Is patient at risk for suicide?: Yes Suicidal Plan?: No-Not Currently/Within Last 6 Months Access to Means: Yes Specify Access to Suicidal Means: Pills, Sharps, Gun What  has been your use of drugs/alcohol within the last 12 months?: Abusing alcohol(04-11-2013) Previous Attempts/Gestures: No How many times?: 0 Other Self Harm Risks: None  Triggers for Past Attempts: None known Intentional Self Injurious Behavior: None Family Suicide History: No Recent stressful life event(s): Loss (Comment) (Cat died(2013-04-11); granddaughter in the home; son has legal) Persecutory voices/beliefs?: No Depression: Yes Depression Symptoms: Loss of interest in usual pleasures;Feeling worthless/self pity;Despondent;Insomnia Substance abuse history and/or treatment for substance abuse?: No Suicide prevention information given to non-admitted patients: Not applicable  Risk to Others Homicidal Ideation: No Thoughts of Harm to Others: No Current Homicidal Intent: No Current Homicidal Plan: No Access to Homicidal Means: No Identified Victim: None  History of harm to others?: No Assessment of Violence: None Noted Violent Behavior Description: None  Does patient have access to weapons?: No Criminal Charges Pending?: No Does patient have a court date: No  Psychosis Hallucinations: None noted Delusions: None noted  Mental Status Report Appear/Hygiene: Disheveled Eye Contact: Fair Motor Activity: Unremarkable Speech: Logical/coherent Level of Consciousness: Alert Mood: Depressed;Sad Affect: Depressed;Sad Anxiety Level: None Thought Processes: Coherent;Relevant Judgement:  Impaired Orientation: Person;Place;Time;Situation Obsessive Compulsive Thoughts/Behaviors: None  Cognitive Functioning Concentration: Decreased Memory: Remote Intact;Recent Impaired (Admits to short term memory loss at times ) IQ: Average Insight: Poor Impulse Control: Poor Appetite: Good Weight Loss: 0 Weight Gain: 0 Sleep: Decreased Total Hours of Sleep: 4 Vegetative Symptoms: None  ADLScreening [facility] Assessment Services) Patient's cognitive ability adequate to safely complete daily activities?: Yes Patient able to express need for assistance with ADLs?: Yes Independently performs ADLs?: Yes (appropriate for developmental age)  Prior Inpatient Therapy Prior Inpatient Therapy: No Prior Therapy Dates: None  Prior Therapy Facilty/Provider(s): None  Reason for Treatment: None   Prior Outpatient Therapy Prior Outpatient Therapy: Yes Prior Therapy Dates: Current  Prior Therapy Facilty/Provider(s): Dr. Betti Cruz  Reason for Treatment: Med Mgt   ADL Screening (condition at time of admission) Patient's cognitive ability adequate to safely complete daily activities?: Yes Is the patient deaf or have difficulty hearing?: No Does the patient have difficulty seeing, even when wearing glasses/contacts?: No Does the patient have difficulty concentrating, remembering, or making decisions?: Yes Patient able to express need for assistance with ADLs?: Yes Does the patient have difficulty dressing or bathing?: No Independently performs ADLs?: Yes (appropriate for developmental age) Does the patient have difficulty walking or climbing stairs?: No Weakness of Legs: None Weakness of Arms/Hands: None  Home Assistive Devices/Equipment Home Assistive Devices/Equipment: None  Therapy Consults (therapy consults require a physician order) PT Evaluation Needed: No OT Evalulation Needed: No SLP Evaluation Needed: No Abuse/Neglect Assessment (Assessment to be complete while patient is  alone) Physical Abuse: Denies Verbal Abuse: Denies Sexual Abuse: Denies Exploitation of patient/patient's resources: Denies Self-Neglect: Denies Values / Beliefs Cultural Requests During Hospitalization: None Spiritual Requests During Hospitalization: None Consults Spiritual Care Consult Needed: No Social Work Consult Needed: No Merchant navy officer (For Healthcare) Advance Directive: Patient does not have advance directive;Patient would not like information Pre-existing out of facility DNR order (yellow form or pink MOST form): No Nutrition Screen- [facility] Adult/[facility] Patient's home diet: Regular  Additional Information 1:1 In Past 12 Months?: No CIRT Risk: No Elopement Risk: No Does patient have medical clearance?: Yes     Disposition:  Disposition Initial Assessment Completed for this Encounter: Yes Disposition of Patient: Referred to (AM psych eval for final disposition ) Patient referred to: Other (Comment) (AM psych eval for final disposition )  On Site Evaluation by:   Reviewed  with Physician:    Murrell Redden 03/25/2013 5:12 AM

## 2013-03-26 ENCOUNTER — Encounter (HOSPITAL_COMMUNITY): Payer: Self-pay | Admitting: Psychiatry

## 2013-03-26 DIAGNOSIS — R45851 Suicidal ideations: Secondary | ICD-10-CM

## 2013-03-26 DIAGNOSIS — F332 Major depressive disorder, recurrent severe without psychotic features: Principal | ICD-10-CM

## 2013-03-26 DIAGNOSIS — F411 Generalized anxiety disorder: Secondary | ICD-10-CM

## 2013-03-26 LAB — PROTIME-INR: Prothrombin Time: 21.5 seconds — ABNORMAL HIGH (ref 11.6–15.2)

## 2013-03-26 MED ORDER — WARFARIN SODIUM 5 MG PO TABS
5.0000 mg | ORAL_TABLET | Freq: Once | ORAL | Status: AC
  Administered 2013-03-26: 5 mg via ORAL
  Filled 2013-03-26: qty 1

## 2013-03-26 MED ORDER — FLUOXETINE HCL 20 MG PO CAPS: 40.0000 mg | ORAL_CAPSULE | Freq: Every day | ORAL | Status: DC

## 2013-03-26 MED ORDER — HYDROCHLOROTHIAZIDE 25 MG PO TABS
25.0000 mg | ORAL_TABLET | Freq: Every day | ORAL | Status: DC
  Administered 2013-03-26 – 2013-03-28 (×3): 25 mg via ORAL
  Filled 2013-03-26 (×6): qty 1

## 2013-03-26 MED ORDER — FLUOXETINE HCL 20 MG PO CAPS
40.0000 mg | ORAL_CAPSULE | Freq: Every day | ORAL | Status: DC
  Administered 2013-03-27 – 2013-03-28 (×2): 40 mg via ORAL
  Filled 2013-03-26: qty 14
  Filled 2013-03-26 (×4): qty 2

## 2013-03-26 MED ORDER — MELOXICAM 15 MG PO TABS
15.0000 mg | ORAL_TABLET | Freq: Every day | ORAL | Status: DC
  Administered 2013-03-26 – 2013-03-28 (×3): 15 mg via ORAL
  Filled 2013-03-26 (×6): qty 1

## 2013-03-26 MED ORDER — ALPRAZOLAM 0.5 MG PO TABS
0.5000 mg | ORAL_TABLET | Freq: Three times a day (TID) | ORAL | Status: DC | PRN
  Administered 2013-03-26 – 2013-03-28 (×6): 0.5 mg via ORAL
  Filled 2013-03-26 (×6): qty 1

## 2013-03-26 MED ORDER — ALPRAZOLAM 0.5 MG PO TABS: 0.5000 mg | ORAL_TABLET | Freq: Two times a day (BID) | ORAL | Status: DC | PRN

## 2013-03-26 MED ORDER — WARFARIN - PHARMACIST DOSING INPATIENT
Freq: Every day | Status: DC
  Filled 2013-03-26 (×12): qty 1

## 2013-03-26 NOTE — Progress Notes (Signed)
[facility] Group Notes:  (Nursing/MHT/Case Management/Adjunct)  Date:  03/26/2013  Time:  2:33 PM  Type of Therapy:  Psychoeducational Skills  Participation Level:  Active  Participation Quality:  Appropriate  Affect:  Appropriate  Cognitive:  Appropriate  Insight:  Appropriate  Engagement in Group:  Engaged  Modes of Intervention:  Activity  Summary of Progress/Problems:  [name] [name] C 03/26/2013, 2:33 PM 

## 2013-03-26 NOTE — [facility] Suicide Risk Assessment (Signed)
Suicide Risk Assessment  Admission Assessment     Nursing information obtained from:    Demographic factors:    Current Mental Status:    Loss Factors:    Historical Factors:    Risk Reduction Factors:     CLINICAL FACTORS:   Depression:   Anhedonia Hopelessness Insomnia Severe  COGNITIVE FEATURES THAT CONTRIBUTE TO RISK:  Closed-mindedness    SUICIDE RISK:   Moderate:  Frequent suicidal ideation with limited intensity, and duration, some specificity in terms of plans, no associated intent, good self-control, limited dysphoria/symptomatology, some risk factors present, and identifiable protective factors, including available and accessible social support.  PLAN OF CARE:  I certify that inpatient services furnished can reasonably be expected to improve the patient's condition.  Lean Fayson T. 03/26/2013, 11:50 AM

## 2013-03-26 NOTE — Progress Notes (Addendum)
D Pt is seen OOB UAL on the 500 hall today...tolerated fair..he is jovial and  Seen laughing and carrying on with the staff and his peers. He is assisted to shave first thing this morning and is very appreciative that he is helped to do this. HE is focused on going home today, ie He says over and over and over " I've just got to get home and when is the doctor going  To talk to me and let me go..?"    A HE is started back on xanax and ativan is dc'd and mobic is restarted and 5.0 mg coumadin is  Ordered for today at dinnertime. He attends his groups and is engaged in the [employer]. He is actively trying to understand his feelings and to develop healthier skills.   R Safety is in place and pt is actively trying to process and understand his unhealthy coping skills and to learn healthier. Hopeful that he will be dc'd tomorrow.

## 2013-03-26 NOTE — Progress Notes (Signed)
ANTICOAGULATION CONSULT NOTE - Initial Consult  Pharmacy Consult for Coumadin Indication: A Fib  Allergies  Allergen Reactions  . Iohexol      Code: HIVES, Desc: POST MYELOGRAM.  PT ALSO SAID THIS HAPPENED A YEAR AGO WITH A MYELOGRAM AND WITH ESI'S., Onset Date: [id number]   . Meperidine Hcl     REACTION: unspecified  . Prednisone     Redness of skin & jittery    Patient Measurements: Height: 5\' 7"  (170.2 cm) Weight: 194 lb (87.998 kg) IBW/kg (Calculated) : 66.1 Heparin Dosing Weight:   Vital Signs: Temp: 97.1 F (36.2 C) (11/01 0829) Temp src: Oral (11/01 0829) BP: 169/98 mmHg (11/01 0830) Pulse Rate: 80 (11/01 0830)  Labs:  Recent Labs  03/25/13 0005 03/25/13 0645 03/26/13 0658  HGB 13.9  --   --   HCT 39.3  --   --   PLT 176  --   --   LABPROT 22.1* 23.4* 21.5*  INR 2.00* 2.16* 1.93*  CREATININE 0.79  --   --     Estimated Creatinine Clearance: 87.1 ml/min (by C-G formula based on Cr of 0.79).   Medical History: Past Medical History  Diagnosis Date  . Paroxysmal atrial fibrillation   . HTN (hypertension)   . Hyperlipidemia   . Migraines   . Arthritis of knee, right     end-stage  . Bursitis   . Gout   . Tinnitus   . Depression   . Bronchitis   . Heart murmur   . Anemia     since post op- knee replacement   . Complication of anesthesia     irritation in throat postop, makes a-fib worse   . DJD (degenerative joint disease)     OA- "everywhere"  . Anxiety     takes xanax 2 times per day, taken mostly for ringing in ears    . PONV (postoperative nausea and vomiting)   . Family history of anesthesia complication     sister with nausia and vomiting    Medications:  Scheduled:  . ferrous sulfate  325 mg Oral Q breakfast  . [START ON 03/27/2013] FLUoxetine  40 mg Oral Daily  . hydrochlorothiazide  25 mg Oral Daily  . pantoprazole  40 mg Oral Daily  . potassium chloride SA  20 mEq Oral BID  . propranolol  20 mg Oral BID  . warfarin  5 mg Oral  ONCE-1800  . Warfarin - Pharmacist Dosing Inpatient   Does not apply q1800    Assessment: 72 yo male with history of A Fib.  Home dose of Coumadin 2.5 mg daily.  No Bleeding problems.  INR therapeutic on admission.  Today INR is subtherapeutic.  Goal of Therapy:  INR 2-3    Plan:  Will increase dose of Coumadin to 5 mg tonight and recheck INR in AM.  Pamala Duffel L 03/26/2013,10:18 AM

## 2013-03-26 NOTE — Progress Notes (Signed)
Adult Psychoeducational Group Note  Date:  03/26/2013 Time:  8:00pm  Group Topic/Focus:  Wrap-Up Group:   The focus of this group is to help patients review their daily goal of treatment and discuss progress on daily workbooks.  Participation Level:  Active  Participation Quality:  Appropriate and Attentive  Affect:  Appropriate  Cognitive:  Alert and Appropriate  Insight: Appropriate  Engagement in Group:  Engaged  Modes of Intervention:  Discussion and Education  Additional Comments: Pt attended and participated in group. Environmental rounds, Q 15 minute checks medication issues and rules for group were explained. The Question for wrap up group was, How was your day? Pt stated he had a good day and feels good and being around peers and he is sleeping well.   Shelly Bombard D 03/26/2013, 9:19 PM

## 2013-03-26 NOTE — Progress Notes (Signed)
Psychoeducational Group Note  Date: 03/26/2013 Time:  1015  Group Topic/Focus:  Identifying Needs:   The focus of this group is to help patients identify their personal needs that have been historically problematic and identify healthy behaviors to address their needs.  Participation Level:  Active  Participation Quality:  Attentive  Affect:  Appropriate  Cognitive:  Oriented  Insight:  Improving  Engagement in Group:  Engaged  Additional Comments:    Yuritzi Kamp A 

## 2013-03-26 NOTE — Progress Notes (Signed)
.  Psychoeducational Group Note    Date: 03/26/2013 Time:  0930  Goal Setting Purpose of Group: To be able to set a goal that is measurable and that can be accomplished in one day  Participation Level:  Active  Participation Quality:  Appropriate  Affect:  Appropriate  Cognitive:  Oriented  Insight:  Improving  Engagement in Group:  Improving  Additional Comments:    Catheryne Deford A  

## 2013-03-26 NOTE — [facility] Group Notes (Signed)
[facility] Group Notes:  (Clinical Social Work)  03/26/2013   3:00-4:00PM  Summary of Progress/Problems:   The main focus of today's process group was for the patient to identify ways in which they have sabotaged their own mental health wellness/recovery.  Motivational interviewing was used to explore the reasons they engage in this behavior, and reasons they may have for wanting to change.  The Stages of Change were explained to the group using a handout, and patients identified where they are with regard to changing self-defeating behaviors.  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  However, this leads to depression because he is not always able to be #1, and he also has had conflict with employers because he feels he deserves recognition for things that he doesn't get and he feels this is unfair.  Type of Therapy:  Process Group  Participation Level:  Active  Participation Quality:  Attentive and Sharing  Affect:  Blunted  Cognitive:  Appropriate and Oriented  Insight:  Developing/Improving  Engagement in Therapy:  Engaged  Modes of Intervention:  Education, Motivational Interviewing   Ambrose Mantle, LCSW 03/26/2013, 1:56 PM

## 2013-03-26 NOTE — Progress Notes (Signed)
Patient ID: [name] [name], male   DOB: 09-05-1940, 72 y.o.   MRN: [id number] D)  Pt has been out and about on the hall this evening, pleasant and cooperative, interacting appropriately with staff and peers.  Attended group, came to med window afterward for something to help him sleep, requested his last xanax for the day.  Stated he had started having problems with his blood pressure this week and had been started on HCTZ.  BP this evening was 184/89.  Has been laughing with peers, showing staff how to [place], pleasant.   A)  Will continue to monitor for safety, continue POC R)  Safety maintained.
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
## 2013-03-27 ENCOUNTER — Encounter (HOSPITAL_COMMUNITY): Payer: Self-pay | Admitting: Emergency Medicine

## 2013-03-27 ENCOUNTER — Inpatient Hospital Stay (HOSPITAL_COMMUNITY): Payer: Medicare Other

## 2013-03-27 DIAGNOSIS — R51 Headache: Secondary | ICD-10-CM | POA: Diagnosis not present

## 2013-03-27 LAB — COMPREHENSIVE METABOLIC PANEL
ALT: 18 U/L (ref 0–53)
Albumin: 4.5 g/dL (ref 3.5–5.2)
BUN: 14 mg/dL (ref 6–23)
CO2: 26 mEq/L (ref 19–32)
Calcium: 10 mg/dL (ref 8.4–10.5)
Creatinine, Ser: 0.97 mg/dL (ref 0.50–1.35)
GFR calc Af Amer: >90 mL/min (ref >90)
GFR calc non Af Amer: 80 mL/min — ABNORMAL LOW (ref >90)
Glucose, Bld: 99 mg/dL (ref 70–99)
Sodium: 131 mEq/L — ABNORMAL LOW (ref 135–145)

## 2013-03-27 LAB — CBC
Hemoglobin: 14.4 g/dL (ref 13.0–17.0)
MCH: 30.2 pg (ref 26.0–34.0)
MCHC: 35.1 g/dL (ref 30.0–36.0)
MCV: 86 fL (ref 78.0–100.0)
Platelets: 199 10*3/uL (ref 150–400)
RBC: 4.77 MIL/uL (ref 4.22–5.81)
RDW: 13.1 % (ref 11.5–15.5)

## 2013-03-27 LAB — PROTIME-INR
INR: 1.74 — ABNORMAL HIGH (ref 0.00–1.49)
Prothrombin Time: 19.8 seconds — ABNORMAL HIGH (ref 11.6–15.2)

## 2013-03-27 LAB — POCT I-STAT TROPONIN I: Troponin i, poc: 0 ng/mL (ref 0.00–0.08)

## 2013-03-27 MED ORDER — ACETAMINOPHEN 325 MG PO TABS
650.0000 mg | ORAL_TABLET | Freq: Once | ORAL | Status: AC
  Administered 2013-03-27: 650 mg via ORAL
  Filled 2013-03-27: qty 2

## 2013-03-27 MED ORDER — ZOLPIDEM TARTRATE 5 MG PO TABS
5.0000 mg | ORAL_TABLET | Freq: Every evening | ORAL | Status: DC | PRN
  Administered 2013-03-27: 5 mg via ORAL
  Filled 2013-03-27: qty 1

## 2013-03-27 MED ORDER — WARFARIN SODIUM 5 MG PO TABS
5.0000 mg | ORAL_TABLET | Freq: Once | ORAL | Status: AC
  Administered 2013-03-27: 5 mg via ORAL
  Filled 2013-03-27 (×2): qty 1

## 2013-03-27 MED ORDER — DIPHENHYDRAMINE HCL 25 MG PO CAPS: 25.0000 mg | ORAL_CAPSULE | Freq: Every evening | ORAL | Status: DC | PRN

## 2013-03-27 NOTE — Progress Notes (Signed)
At lunch time when vitals were taken, pt stated he felt like his blood pressure was high. BP was 196/112 using the dinamp. Pt did not go down to lunch. Pt stated he was not feeling well and just wanted something lite. Pt was given graham crackers, peanut butter, and a ginger ale to help settle his stomach.

## 2013-03-27 NOTE — Progress Notes (Signed)
[facility] MD Progress Note  03/27/2013 11:37 AM [name] [name]  MRN:  [id number] Subjective:  Patient's sleep was interrupted due to diuretic at lunch time for hypertension, took it this am so sleep should not be hindered tonight.  He signed a 72 hour discharge, this time is up tomorrow.  Maximus wants to discharge, denies suicidal and homicidal ideations, attending and participating in therapy. Diagnosis:   DSM5:  Depressive Disorders:  Major Depressive Disorder - Severe (296.23)  Axis I: Anxiety Disorder NOS and Major Depression, Recurrent severe Axis II: Deferred Axis III:  Past Medical History  Diagnosis Date  . Paroxysmal atrial fibrillation   . HTN (hypertension)   . Hyperlipidemia   . Migraines   . Arthritis of knee, right     end-stage  . Bursitis   . Gout   . Tinnitus   . Depression   . Bronchitis   . Heart murmur   . Anemia     since post op- knee replacement   . Complication of anesthesia     irritation in throat postop, makes a-fib worse   . DJD (degenerative joint disease)     OA- "everywhere"  . Anxiety     takes xanax 2 times per day, taken mostly for ringing in ears    . PONV (postoperative nausea and vomiting)   . Family history of anesthesia complication     sister with nausia and vomiting   Axis IV: other psychosocial or environmental problems, problems related to social environment and problems with primary support group Axis V: 51-60 moderate symptoms  ADL's:  Intact  Sleep: Poor  Appetite:  Good  Suicidal Ideation:  Denies Homicidal Ideation:  Denies  Psychiatric Specialty Exam: Review of Systems  Constitutional: Negative.   HENT: Negative.   Eyes: Negative.   Respiratory: Negative.   Cardiovascular: Negative.   Gastrointestinal: Negative.   Genitourinary: Negative.   Musculoskeletal: Negative.   Skin: Negative.   Neurological: Negative.   Endo/Heme/Allergies: Negative.   Psychiatric/Behavioral: Positive for depression. The patient is  nervous/anxious and has insomnia.     Blood pressure 150/96, pulse 85, temperature 97.3 F (36.3 C), temperature source Oral, resp. rate 18, height 5\' 7"  (1.702 m), weight 87.998 kg (194 lb).Body mass index is 30.38 kg/(m^2).  General Appearance: Casual  Eye Contact::  Fair  Speech:  Normal Rate  Volume:  Normal  Mood:  Depressed  Affect:  Congruent  Thought Process:  Coherent  Orientation:  Full (Time, Place, and Person)  Thought Content:  WDL  Suicidal Thoughts:  No  Homicidal Thoughts:  No  Memory:  Immediate;   Fair Recent;   Fair Remote;   Fair  Judgement:  Fair  Insight:  Good  Psychomotor Activity:  Normal  Concentration:  Fair  Recall:  Fair  Akathisia:  No  Handed:  Right  AIMS (if indicated):     Assets:  Leisure Time Physical Health Resilience  Sleep:  Number of Hours: 3.75   Current Medications: Current Facility-Administered Medications  Medication Dose Route Frequency Provider Last Rate Last Dose  . acetaminophen (TYLENOL) tablet 650 mg  650 mg Oral Q4H PRN Benjaman Pott, MD      . acetaminophen (TYLENOL) tablet 650 mg  650 mg Oral Q6H PRN Benjaman Pott, MD      . ALPRAZolam Prudy Feeler) tablet 0.5 mg  0.5 mg Oral TID PRN Nanine Means, NP   0.5 mg at 03/27/13 0141  . alum & mag hydroxide-simeth (MAALOX/MYLANTA) 200-200-20  MG/5ML suspension 30 mL  30 mL Oral Q4H PRN Benjaman Pott, MD      . ferrous sulfate tablet 325 mg  325 mg Oral Q breakfast Benjaman Pott, MD   325 mg at 03/27/13 0811  . FLUoxetine (PROZAC) capsule 40 mg  40 mg Oral Daily Nanine Means, NP   40 mg at 03/27/13 0810  . hydrochlorothiazide (HYDRODIURIL) tablet 25 mg  25 mg Oral Daily Nanine Means, NP   25 mg at 03/27/13 0811  . magnesium hydroxide (MILK OF MAGNESIA) suspension 30 mL  30 mL Oral Daily PRN Benjaman Pott, MD      . meloxicam [facility]) tablet 15 mg  15 mg Oral Daily Nanine Means, NP   15 mg at 03/27/13 0811  . pantoprazole (PROTONIX) EC tablet 40 mg  40 mg Oral Daily Benjaman Pott, MD   40 mg at 03/27/13 0811  . potassium chloride SA (K-DUR,KLOR-CON) CR tablet 20 mEq  20 mEq Oral BID Benjaman Pott, MD   20 mEq at 03/27/13 0811  . propranolol (INDERAL) tablet 20 mg  20 mg Oral BID Benjaman Pott, MD   20 mg at 03/27/13 0811  . warfarin (COUMADIN) tablet 5 mg  5 mg Oral ONCE-1800 Malva Cogan, [facility]      . Warfarin - Pharmacist Dosing Inpatient   Does not apply q1800 Malva Cogan, RPH      . zolpidem (AMBIEN) tablet 5 mg  5 mg Oral QHS PRN Nehemiah Settle, MD        Lab Results:  Results for orders placed during the hospital encounter of 03/25/13 (from the past 48 hour(s))  PROTIME-INR     Status: Abnormal   Collection Time    03/26/13  6:58 AM      Result Value Range   Prothrombin Time 21.5 (*) 11.6 - 15.2 seconds   INR 1.93 (*) 0.00 - 1.49   Comment: Performed at [facility]  PROTIME-INR     Status: Abnormal   Collection Time    03/27/13  6:42 AM      Result Value Range   Prothrombin Time 19.8 (*) 11.6 - 15.2 seconds   INR 1.74 (*) 0.00 - 1.49   Comment: Performed at [facility]    Physical Findings: AIMS: Facial and Oral Movements Muscles of Facial Expression: None, normal Lips and Perioral Area: None, normal Jaw: None, normal Tongue: None, normal,Extremity Movements Upper (arms, wrists, hands, fingers): None, normal Lower (legs, knees, ankles, toes): None, normal, Trunk Movements Neck, shoulders, hips: None, normal, Overall Severity Severity of abnormal movements (highest score from questions above): None, normal Incapacitation due to abnormal movements: None, normal Patient's awareness of abnormal movements (rate only patient's report): No Awareness, Dental Status Current problems with teeth and/or dentures?: No Does patient usually wear dentures?: No  CIWA:    COWS:     Treatment Plan Summary: Daily contact with patient to assess and evaluate symptoms and progress in  treatment Medication management  Plan:  Review of chart, vital signs, medications, and notes. 1-Individual and group therapy 2-Medication management for depression and anxiety:  Medications reviewed with the patient and he stated no untoward effects, no changes made 3-Coping skills for depression, anxiety 4-Continue crisis stabilization and management 5-Address health issues--monitoring vital signs, remains elevated but decreasing slightly 6-Treatment plan in progress to prevent relapse of depression and anxiety  Medical Decision Making Problem Points:  Established problem, stable/improving (1)  and Review of psycho-social stressors (1) Data Points:  Review of medication regiment & side effects (2)  I certify that inpatient services furnished can reasonably be expected to improve the patient's condition.   Nanine Means, PMH-NP 03/27/2013, 11:37 AM  I agreed with the findings, treatment and disposition plan of this patient. Kathryne Sharper, MD

## 2013-03-27 NOTE — Progress Notes (Signed)
ANTICOAGULATION CONSULT NOTE - Follow Up Consult  Pharmacy Consult for Coumadin  Indication: atrial fibrillation  Allergies  Allergen Reactions  . Iohexol      Code: HIVES, Desc: POST MYELOGRAM.  PT ALSO SAID THIS HAPPENED A YEAR AGO WITH A MYELOGRAM AND WITH ESI'S., Onset Date: [id number]   . Meperidine Hcl     REACTION: unspecified  . Prednisone     Redness of skin & jittery    Patient Measurements: Height: 5\' 7"  (170.2 cm) Weight: 194 lb (87.998 kg) IBW/kg (Calculated) : 66.1 Heparin Dosing Weight:   Vital Signs: Temp: 97.3 F (36.3 C) (11/02 0735) Temp src: Oral (11/02 0735) BP: 150/96 mmHg (11/02 0736) Pulse Rate: 85 (11/02 0736)  Labs:  Recent Labs  03/25/13 0005 03/25/13 0645 03/26/13 0658 03/27/13 0642  HGB 13.9  --   --   --   HCT 39.3  --   --   --   PLT 176  --   --   --   LABPROT 22.1* 23.4* 21.5* 19.8*  INR 2.00* 2.16* 1.93* 1.74*  CREATININE 0.79  --   --   --     Estimated Creatinine Clearance: 87.1 ml/min (by C-G formula based on Cr of 0.79).   Medications:  Scheduled:  . ferrous sulfate  325 mg Oral Q breakfast  . FLUoxetine  40 mg Oral Daily  . hydrochlorothiazide  25 mg Oral Daily  . meloxicam  15 mg Oral Daily  . pantoprazole  40 mg Oral Daily  . potassium chloride SA  20 mEq Oral BID  . propranolol  20 mg Oral BID  . warfarin  5 mg Oral ONCE-1800  . Warfarin - Pharmacist Dosing Inpatient   Does not apply q1800    Assessment: Patients INR Dropped to 1.74.  May be due to missed Coumadin dose on 10/31 Goal of Therapy:  INR 2-3    Plan:  Will give another Coumadin 5 mg dose today and recheck INR in AM.  Pamala Duffel L 03/27/2013,10:03 AM

## 2013-03-27 NOTE — [facility] Group Notes (Signed)
[facility] Group Notes:  (Clinical Social Work)  03/27/2013   1:15-2:20PM  Summary of Progress/Problems:   The main focus of today's process group was to   identify the patient's current support system and decide on other supports that can be put in place.  The picture on workbook was used to discuss why additional supports are needed, then used to talk about how patients have given and received all different kinds of support.  An emphasis was placed on using counselor, doctor, therapy groups, 12-step groups, and problem-specific support groups to expand supports.  The patient expressed full comprehension of the concepts presented, and agreed that there is a need to add more supports.  The patient stated the current supports in place are his family, doctor and church.  He did not talk much in group today, seemed distracted or not feeling well.  Type of Therapy:  Process Group  Participation Level:  Active  Participation Quality:  Attentive  Affect:  Blunted  Cognitive:  Appropriate  Insight:  Developing/Improving  Engagement in Therapy:  Engaged  Modes of Intervention:  Education,  Support and [employer], LCSW 03/27/2013, 3:04 PM

## 2013-03-27 NOTE — Progress Notes (Signed)
D Pt is seen OOB UAL on the unit today...tolerated well. He says, first thing this morning" I'm ready to go...when can I speak with the doctor?".   A Pt attends AM groups and is engaged in the discussion. HE shares that he is " ready to go" and that he knows what he has to do to make his life " healtheir".   R He is upset and shares with this nurse that he  Feels he  " was lied to", after he met with physician today and physician told him he would not be DC'd today. He has focused on his BP this afternoon. It was 196/116 and 189/125 around lunch and he was given prn dose of xanax po. BP rechecked about 1 hr later nad 192/106 sitting left arm. Pt denies CP, SOB, irreg heart beats and / or other cardiac symptoms./ Pt states over and over " I'm scared.Marland Kitchenibuprofen want to go to a medical hospital..ibuprofen am worried aqbout my BP.....". THis RN spoke wih NP and pt to be sent via non emergent 911 to [facility] ED. Per pt's request, this RN relayed this info to pt's wife and then verbal reprot called to RN at [facility]. Pt in NAD at 1528. MHT to accompany.
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
## 2013-03-27 NOTE — Progress Notes (Signed)
Psychoeducational Group Note  Date:  03/27/2013 Time:  1015  Group Topic/Focus:  Making Healthy Choices:   The focus of this group is to help patients identify negative/unhealthy choices they were using prior to admission and identify positive/healthier coping strategies to replace them upon discharge.  Participation Level:  Active  Participation Quality:  Appropriate  Affect:  Anxious and Appropriate  Cognitive:  Oriented  Insight:  Improving  Engagement in Group:  Engaged  Additional Comments:    Lorel Lembo A 03/27/2013 

## 2013-03-27 NOTE — ED Notes (Signed)
[facility] called for transport back to [facility] 

## 2013-03-27 NOTE — ED Notes (Signed)
Pt from [facility] c/o HTN. Pt takes BP meds but has not been controlled for the past week. Pt states he has a pacemaker, afib. Pt is A&O and in NAD. Pt has sitter from [facility] with him at bediside

## 2013-03-27 NOTE — ED Provider Notes (Signed)
CSN: [id number]     Arrival date & time 03/27/13  1551 History   First MD Initiated Contact with Patient 03/27/13 1621     Chief Complaint  Patient presents with  . Hypertension   (Consider location/radiation/quality/duration/timing/severity/associated sxs/prior Treatment) Patient is a 72 y.o. male presenting with hypertension. The history is provided by the patient and medical records. No language interpreter was used.  Hypertension Associated symptoms include headaches. Pertinent negatives include no abdominal pain, chest pain, coughing, diaphoresis, fatigue, fever, nausea, rash or vomiting.    [name] [name] is a 72 y.o. male  with a hx of suicide attempt, A. fib on chronic anticoagulation with Coumadin, hypertension, hyperlipidemia, migraine, depression, anemia, anxiety presents to the Emergency Department complaining of gradual, persistent, progressively worsening headache onset approximately 9:30 this morning. Associated symptoms include nausea, hypertension. Patient was seen here on March 24, 2013 for suicide attempt and transferred to behavioral health on 03/25/2013. Patient reports that since that time his blood pressure has been high. He reports that he has felt more depressed anxious and stressed since being transferred there in before his initial arrival to emergency department.  Patient has not tried anything for the headache but currently requested Tylenol. Nothing seems to make it better or worse. He reports it as frontal in nature with associated mild lightheadedness. He states he feels exactly like this when he is in A. fib but had an ablation one year ago and has had only 2 episodes at that time. He reports normal cardiology checkup was in 1 week ago. He denies fever, chills, neck pain, neck stiffness, chest pain, shortness of breath, abdominal pain, nausea, vomiting, diarrhea, weakness, dizziness, syncope, dysuria, hematuria.  Patient reports that because of his high blood pressure  at behavioral health he began him on a "water pill" last night and he was up all night using the restroom.    Past Medical History  Diagnosis Date  . Paroxysmal atrial fibrillation   . HTN (hypertension)   . Hyperlipidemia   . Migraines   . Arthritis of knee, right     end-stage  . Bursitis   . Gout   . Tinnitus   . Depression   . Bronchitis   . Heart murmur   . Anemia     since post op- knee replacement   . Complication of anesthesia     irritation in throat postop, makes a-fib worse   . DJD (degenerative joint disease)     OA- "everywhere"  . Anxiety     takes xanax 2 times per day, taken mostly for ringing in ears    . PONV (postoperative nausea and vomiting)   . Family history of anesthesia complication     sister with nausia and vomiting   Past Surgical History  Procedure Laterality Date  . Transesophageal echocardiogram  04/2008  . Transthoracic echocardiogram  2006,2009  . Bilteral thumb joint surgrery    . Right rotator cuff surgery  2006  . Cholecystectomy    . Rhinoplasty    . Uvulectomy    . Appendectomy    . Pacemaker insertion  06/06/2005    Medtronic EnRhythm dual-chamber. For sick sinus syndrome  . Right total hip arthroplasty    . Left rotator cuff repair    . Cardiac catheterization  2005     which revealed 40% stenosis of the mid left anterior descending artery., ablation- 2009  . Tonsillectomy      as a child  . Joint replacement  L knee- 12/11,R hip- 1996  . Total knee revision  08/08/2011    Procedure: TOTAL KNEE REVISION;  Surgeon: Nestor Lewandowsky, MD;  Location: [facility] OR;  Service: Orthopedics;  Laterality: Left;  DEPUY/SIGMA  . Atrial fibrillation ablation  2009, 12/02/11    PVI by Dr Johney Frame x 2  . Tee without cardioversion  12/01/2011    Procedure: TRANSESOPHAGEAL ECHOCARDIOGRAM (TEE);  Surgeon: Pricilla Riffle, MD;  Location: [facility] ENDOSCOPY;  Service: Cardiovascular;  Laterality: N/A;  a-fib ablation following day   Family History  Problem  Relation Age of Onset  . Coronary artery disease Mother   . Other Mother     Cerebrovascular Disease  . Cancer    . Anesthesia problems Neg Hx   . Hypotension Neg Hx   . Malignant hyperthermia Neg Hx   . Pseudochol deficiency Neg Hx    History  Substance Use Topics  . Smoking status: Never Smoker   . Smokeless tobacco: Never Used  . Alcohol Use: Yes     Comment: rare use    Review of Systems  Constitutional: Negative for fever, diaphoresis, appetite change, fatigue and unexpected weight change.  HENT: Negative for mouth sores.   Eyes: Negative for visual disturbance.  Respiratory: Negative for cough, chest tightness, shortness of breath and wheezing.   Cardiovascular: Negative for chest pain.  Gastrointestinal: Negative for nausea, vomiting, abdominal pain, diarrhea and constipation.  Endocrine: Negative for polydipsia, polyphagia and polyuria.  Genitourinary: Negative for dysuria, urgency, frequency and hematuria.  Musculoskeletal: Negative for back pain and neck stiffness.  Skin: Negative for rash.  Allergic/Immunologic: Negative for immunocompromised state.  Neurological: Positive for headaches. Negative for syncope and light-headedness.  Hematological: Does not bruise/bleed easily.  Psychiatric/Behavioral: Positive for dysphoric mood. Negative for sleep disturbance. The patient is not nervous/anxious.     Allergies  Iohexol; Meperidine hcl; and Prednisone  Home Medications   Current Outpatient Rx  Name  Route  Sig  Dispense  Refill  . ALPRAZolam (XANAX) 0.5 MG tablet   Oral   Take 0.5 mg by mouth 3 (three) times daily as needed.          . ferrous sulfate 325 (65 FE) MG tablet   Oral   Take 325 mg by mouth daily with breakfast.          . FLUoxetine (PROZAC) 40 MG capsule   Oral   Take 40 mg by mouth daily.          . meloxicam (MOBIC) 7.5 MG tablet   Oral   Take 15 mg by mouth daily.          . pantoprazole (PROTONIX) 40 MG tablet   Oral   Take  40 mg by mouth daily.         . potassium chloride SA (K-DUR,KLOR-CON) 20 MEQ tablet   Oral   Take 20 mEq by mouth 2 (two) times daily.          . Probiotic Product (ALIGN) 4 MG CAPS   Oral   Take 1 capsule by mouth daily.           . propranolol (INDERAL) 20 MG tablet   Oral   Take 20 mg by mouth 2 (two) times daily.         Marland Kitchen warfarin (COUMADIN) 2.5 MG tablet   Oral   Take 2.5 mg by mouth daily.          BP 166/82  Pulse 61  Temp(Src)  97.4 F (36.3 C) (Oral)  Resp 20  Ht 5\' 7"  (1.702 m)  Wt 194 lb (87.998 kg)  BMI 30.38 kg/m2  SpO2 98% Physical Exam  Nursing note and vitals reviewed. Constitutional: He is oriented to person, place, and time. He appears well-developed and well-nourished. No distress.  Awake, alert, nontoxic appearance  HENT:  Head: Normocephalic and atraumatic.  Nose: Nose normal.  Mouth/Throat: Oropharynx is clear and moist. No oropharyngeal exudate.  Eyes: Conjunctivae and EOM are normal. Pupils are equal, round, and reactive to light. No scleral icterus.  Neck: Normal range of motion. Neck supple.  Cardiovascular: Normal rate, regular rhythm, normal heart sounds and intact distal pulses.   No murmur heard. Regular rate and rhythm  Pulmonary/Chest: Effort normal and breath sounds normal. No respiratory distress. He has no wheezes. He has no rales.  Abdominal: Soft. Bowel sounds are normal. He exhibits no mass. There is no tenderness. There is no rebound and no guarding.  Musculoskeletal: Normal range of motion. He exhibits no edema.  Lymphadenopathy:    He has no cervical adenopathy.  Neurological: He is alert and oriented to person, place, and time. He has normal reflexes. No cranial nerve deficit. He exhibits normal muscle tone. Coordination normal.  Speech is clear and goal oriented, follows commands Cranial nerves III - XII without deficit, no facial droop Normal strength in upper and lower extremities bilaterally, strong and equal  grip strength Sensation normal to light and sharp touch Moves extremities without ataxia, coordination intact Normal finger to nose and rapid alternating movements Neg romberg, no pronator drift Normal gait Normal heel-shin and balance   Skin: Skin is warm and dry. No rash noted. He is not diaphoretic. No erythema.  Psychiatric: He has a normal mood and affect. His behavior is normal. Judgment and thought content normal.    ED Course  Procedures (including critical care time) Labs Review Labs Reviewed  PROTIME-INR - Abnormal; Notable for the following:    Prothrombin Time 21.5 (*)    INR 1.93 (*)    All other components within normal limits  PROTIME-INR - Abnormal; Notable for the following:    Prothrombin Time 19.8 (*)    INR 1.74 (*)    All other components within normal limits  COMPREHENSIVE METABOLIC PANEL - Abnormal; Notable for the following:    Sodium 131 (*)    Chloride 94 (*)    GFR calc non Af Amer 80 (*)    All other components within normal limits  CBC  PROTIME-INR  POCT I-STAT TROPONIN I   Imaging Review Ct Head Wo Contrast  03/27/2013   CLINICAL DATA:  Frontal headaches. Hypertension.  EXAM: CT HEAD WITHOUT CONTRAST  TECHNIQUE: Contiguous axial images were obtained from the base of the skull through the vertex without intravenous contrast.  COMPARISON:  Head CT 10/08/2011.  FINDINGS: There is no evidence of acute intracranial hemorrhage, mass lesion, brain edema or extra-axial fluid collection. The ventricles and subarachnoid spaces are appropriately sized for age. There is no CT evidence of acute cortical infarction. There is stable mild chronic periventricular white matter disease.  There is lobular to within the left maxillary sinus consistent with small polyps or mucus retention cysts. The additional visualized paranasal sinuses, mastoid air cells and middle ears are clear. The calvarium is intact.  IMPRESSION: No acute intracranial findings. Stable mild  periventricular white matter disease.   Electronically Signed   By: Roxy Horseman M.D.   On: 03/27/2013 17:56  EKG Interpretation     Ventricular Rate:  68 PR Interval:  232 QRS Duration: 94 QT Interval:  409 QTC Calculation: 435 R Axis:   4 Text Interpretation:  Atrial-paced rhythm Baseline wander in lead(s) V5 ATRIAL PACED RHYTHM has replaced sinus rhythm            MDM   1. HTN (hypertension)   2. Major depressive disorder, recurrent episode, severe, without mention of psychotic behavior   3. Headache   4. A-fib   5. Long term current use of anticoagulant     [name] [name] presents with headache with associated hypertension on chronic anticoagulation.  Patient neurovascularly intact without neurologic deficit on exam. Doubt CVA however will obtain CT scan. We'll also rule out ACS.  Patient without chest pain or shortness of breath but did have associated nausea earlier. He relates the symptoms to the stress of being told that he was unable to leave [facility] today as planned.  7:30 PM last BP 162/70.  Pt is resting comfortably, in no acute distress. Patient's EKG paced rhythm without evidence of A. fib. No evidence of acute infarct or ST elevation. Troponin negative, kidneys without evidence of acute kidney failure or dysfunction. Patient's lungs clear and equal, no hypoxia and exam without rhonchi or rale. Doubt pulmonary edema.  No x-ray indicated this time.  Discussed this with patient at length. I do believe that his hypertension is directly linked to his anxiety about being "stuck" at behavioral health. He expressed several times that he does not want to go back. It is unclear whether or not patient is voluntary or involuntary committed.  At this point his hypertension shows no evidence of endorgan damage. Recommended that he continue the diuretic and discuss with his cardiologist tomorrow after his discharge. Patient currently denies suicidal or homicidal ideations.  It has  been determined that no acute conditions requiring further emergency intervention are present at this time. The patient/guardian have been advised of the diagnosis and plan. We have discussed signs and symptoms that warrant return to the ED, such as changes or worsening in symptoms.   Vital signs are stable at discharge.   BP 166/82  Pulse 61  Temp(Src) 97.4 F (36.3 C) (Oral)  Resp 20  Ht 5\' 7"  (1.702 m)  Wt 194 lb (87.998 kg)  BMI 30.38 kg/m2  SpO2 98%  Patient/guardian has voiced understanding and agreed to follow-up with the PCP or specialist.  Patient discussed with and evaluated by Dr. Purvis Sheffield with plan to discharge back to behavioral health    Dierdre Forth, PA-C 03/27/13 1952  Dierdre Forth, PA-C 03/27/13 1952

## 2013-03-27 NOTE — Progress Notes (Signed)
Psychoeducational Group Note  Date: 03/27/2013 Time: 0930  Group Topic/Focus:  Gratefulness:  The focus of this group is to help patients identify what two things they are most grateful for in their lives. What helps ground them and to center them on their work to their recovery.  Participation Level:  Active  Participation Quality: particiates  Affect:  Appropriate  Cognitive:  Oriented  Insight:  Improving  Engagement in Group:  Engaged  Additional Comments:   Shaunie Boehm A   

## 2013-03-27 NOTE — ED Notes (Signed)
Pt from [facility] via [facility] for HTN. Pt BP 190/106. Pt BP usually controlled with meds, but cannot get BP down with meds. Pt denies blurred vision, HA. Pt A&O and in NAD

## 2013-03-28 DIAGNOSIS — F411 Generalized anxiety disorder: Secondary | ICD-10-CM

## 2013-03-28 MED ORDER — HYDROCHLOROTHIAZIDE 25 MG PO TABS: 25.0000 mg | ORAL_TABLET | Freq: Every day | ORAL | Status: DC

## 2013-03-28 MED ORDER — POTASSIUM CHLORIDE CRYS ER 20 MEQ PO TBCR: EXTENDED_RELEASE_TABLET | ORAL | Status: DC

## 2013-03-28 MED ORDER — FLUOXETINE HCL 40 MG PO CAPS: ORAL_CAPSULE | ORAL | Status: AC

## 2013-03-28 MED ORDER — PROPRANOLOL HCL 20 MG PO TABS: 20.0000 mg | ORAL_TABLET | Freq: Two times a day (BID) | ORAL | Status: DC

## 2013-03-28 MED ORDER — FERROUS SULFATE 325 (65 FE) MG PO TABS: 325.0000 mg | ORAL_TABLET | Freq: Every day | ORAL | Status: DC

## 2013-03-28 MED ORDER — WARFARIN SODIUM 2.5 MG PO TABS: 2.5000 mg | ORAL_TABLET | Freq: Every day | ORAL | Status: DC

## 2013-03-28 MED ORDER — PANTOPRAZOLE SODIUM 40 MG PO TBEC: 40.0000 mg | DELAYED_RELEASE_TABLET | Freq: Every day | ORAL | Status: DC

## 2013-03-28 MED ORDER — ALPRAZOLAM 0.5 MG PO TABS: 0.5000 mg | ORAL_TABLET | Freq: Three times a day (TID) | ORAL | Status: AC | PRN

## 2013-03-28 MED ORDER — MELOXICAM 7.5 MG PO TABS: ORAL_TABLET | ORAL | Status: DC

## 2013-03-28 NOTE — Progress Notes (Signed)
Pt attended spiritual care group on grief and loss facilitated by chaplain Burnis Kingfisher.  Group opened with brief discussion and psycho-social ed around grief and loss in relationships and in relation to self - identifying life patterns, circumstances, changes that cause losses. Established group norm of speaking from own life experience. Group goal of establishing open and affirming space for members to share loss and experience with grief, normalize grief experience and provide psycho social education and grief support.    [name] [name] expressed worry to the group that he does not grieve appropriately.  Described having to care for affairs of estate as well as caring for other members of family when his parents died.  Stated that he does not cry and feels as though he has been holding in grief.  Was able to receive support and normalization from other group members.  Spoke with group about cultural expectations in dealing with grief as well as whether his process was working for him.    Belva Crome MDiv

## 2013-03-28 NOTE — Discharge Summary (Signed)
Physician Discharge Summary Note  Patient:  [name] [name] is an 72 y.o., male MRN:  [id number] DOB:  1941-02-25 Patient phone:  [phone] (home)  Patient address:   [address] [place] [address],   Date of Admission:  03/25/2013 Date of Discharge: 03/28/2013  Reason for Admission:  Overdose  Discharge Diagnoses: Principal Problem:   Suicidal ideation Active Problems:   Major depressive disorder, recurrent episode, severe, without mention of psychotic behavior  ROS  DSM5: DSM5:  Depressive Disorders: Major Depressive Disorder - Severe (296.23)  AXIS I: Anxiety Disorder NOS and Major Depression, Recurrent severe  AXIS II: Deferred  AXIS III:  Past Medical History   Diagnosis  Date   .  Paroxysmal atrial fibrillation    .  HTN (hypertension)    .  Hyperlipidemia    .  Migraines    .  Arthritis of knee, right      end-stage   .  Bursitis    .  Gout    .  Tinnitus    .  Depression    .  Bronchitis    .  Heart murmur    .  Anemia      since post op- knee replacement   .  Complication of anesthesia      irritation in throat postop, makes a-fib worse   .  DJD (degenerative joint disease)      OA- "everywhere"   .  Anxiety      takes xanax 2 times per day, taken mostly for ringing in ears   .  PONV (postoperative nausea and vomiting)    .  Family history of anesthesia complication      sister with nausia and vomiting    AXIS IV: other psychosocial or environmental problems, problems related to social environment and problems with primary support group  AXIS V: 41-50 serious symptoms  Level of Care:  OP  Hospital Course:  72 y.o. male who presents voluntarily with Depression/SI. Pt reports a long history of depression for "20 yrs". Pt attempted to harm self by overdosing on approx 10-12 0.5mg  of Xanax, 40mg  of Prozac and drinking 7-8 beers. Upon arrival to psych ed, pt told nurse that he a plan to shoot self in the chest.  Patient told the ED RN that  his cat had died and his life was no longer worth living. He went on to elaborate on other stressors so the EDMD felt that he needed admission for safety and crisis management. TTS evaluated the patient and agreed. He was accepted to [facility] for further stabilization and crisis management.       [name] [name] was admitted to the adult unit. He was evaluated and his symptoms were identified. Medication management was discussed and initiated. He was oriented to the unit and encouraged to participate in unit programming. Medical problems were identified and treated appropriately. Home medication was restarted as needed.        The patient was evaluated each day by a clinical provider to ascertain the patient's response to treatment.  Improvement was noted by the patient's report of decreasing symptoms, improved sleep and appetite, affect, medication tolerance, behavior, and participation in unit programming.          [name] [name] was asked each day to complete a self inventory noting mood, mental status, pain, new symptoms, anxiety and concerns.         He responded well to medication and being in a therapeutic and supportive  environment. Positive and appropriate behavior was noted and the patient was motivated for recovery.  [name] Elbert Ewings Jarrellworked closely with the treatment team and case manager to develop a discharge plan with appropriate goals. Coping skills, problem solving as well as relaxation therapies were also part of the unit programming.         By the day of discharge [name] [name] was in much improved condition than upon admission.  Symptoms were reported as significantly decreased or resolved completely. The patient denied SI/HI and voiced no AVH. He was motivated to continue taking medication with a goal of continued improvement in mental health.      The patient  was discharged home with a plan to follow up as noted below. Consults:  None  Significant Diagnostic Studies:  labs: CBC,CMP, UA, UDS  Discharge  Vitals:   Blood pressure 124/84, pulse 69, temperature 97.3 F (36.3 C), temperature source Oral, resp. rate 20, height 5\' 7"  (1.702 m), weight 87.998 kg (194 lb), SpO2 98.00%. Body mass index is 30.38 kg/(m^2). Lab Results:   Results for orders placed during the hospital encounter of 03/25/13 (from the past 72 hour(s))  PROTIME-INR     Status: Abnormal   Collection Time    03/26/13  6:58 AM      Result Value Range   Prothrombin Time 21.5 (*) 11.6 - 15.2 seconds   INR 1.93 (*) 0.00 - 1.49   Comment: Performed at [facility]  PROTIME-INR     Status: Abnormal   Collection Time    03/27/13  6:42 AM      Result Value Range   Prothrombin Time 19.8 (*) 11.6 - 15.2 seconds   INR 1.74 (*) 0.00 - 1.49   Comment: Performed at [facility]  CBC     Status: None   Collection Time    03/27/13  5:35 PM      Result Value Range   WBC 6.7  4.0 - 10.5 K/uL   RBC 4.77  4.22 - 5.81 MIL/uL   Hemoglobin 14.4  13.0 - 17.0 g/dL   HCT 09.8  11.9 - 14.7 %   MCV 86.0  78.0 - 100.0 fL   MCH 30.2  26.0 - 34.0 pg   MCHC 35.1  30.0 - 36.0 g/dL   RDW 82.9  56.2 - 13.0 %   Platelets 199  150 - 400 K/uL  COMPREHENSIVE METABOLIC PANEL     Status: Abnormal   Collection Time    03/27/13  5:35 PM      Result Value Range   Sodium 131 (*) 135 - 145 mEq/L   Potassium 3.9  3.5 - 5.1 mEq/L   Chloride 94 (*) 96 - 112 mEq/L   CO2 26  19 - 32 mEq/L   Glucose, Bld 99  70 - 99 mg/dL   BUN 14  6 - 23 mg/dL   Creatinine, Ser 8.65  0.50 - 1.35 mg/dL   Calcium 78.4  8.4 - 69.6 mg/dL   Total Protein 7.7  6.0 - 8.3 g/dL   Albumin 4.5  3.5 - 5.2 g/dL   AST 24  0 - 37 U/L   ALT 18  0 - 53 U/L   Alkaline Phosphatase 60  39 - 117 U/L   Total Bilirubin 0.9  0.3 - 1.2 mg/dL   GFR calc non Af Amer 80 (*) >90 mL/min   GFR calc Af Amer >90  >90 mL/min   Comment: (NOTE)  The eGFR has been calculated using the CKD EPI equation.     This calculation has not been validated in all clinical  situations.     eGFR's persistently <90 mL/min signify possible Chronic Kidney     Disease.  POCT I-STAT TROPONIN I     Status: None   Collection Time    03/27/13  5:43 PM      Result Value Range   Troponin i, poc 0.00  0.00 - 0.08 ng/mL   Comment 3            Comment: Due to the release kinetics of cTnI,     a negative result within the first hours     of the onset of symptoms does not rule out     myocardial infarction with certainty.     If myocardial infarction is still suspected,     repeat the test at appropriate intervals.    Physical Findings: AIMS: Facial and Oral Movements Muscles of Facial Expression: None, normal Lips and Perioral Area: None, normal Jaw: None, normal Tongue: None, normal,Extremity Movements Upper (arms, wrists, hands, fingers): None, normal Lower (legs, knees, ankles, toes): None, normal, Trunk Movements Neck, shoulders, hips: None, normal, Overall Severity Severity of abnormal movements (highest score from questions above): None, normal Incapacitation due to abnormal movements: None, normal Patient's awareness of abnormal movements (rate only patient's report): No Awareness, Dental Status Current problems with teeth and/or dentures?: No Does patient usually wear dentures?: No  CIWA:    COWS:     Psychiatric Specialty Exam: See Psychiatric Specialty Exam and Suicide Risk Assessment completed by Attending Physician prior to discharge.  Discharge destination:  Home  Is patient on multiple antipsychotic therapies at discharge:  No   Has Patient had three or more failed trials of antipsychotic monotherapy by history:  No  Recommended Plan for Multiple Antipsychotic Therapies: NA  Discharge Orders   Future Appointments Provider Department Dept Phone   04/05/2013 11:45 AM [facility] [place] Office [phone]   06/27/2013 8:05 AM [facility] [phone]   Future  Orders Complete By Expires   Diet - low sodium heart healthy  As directed    Discharge instructions  As directed    Comments:     Take all of your medications as directed. Be sure to keep all of your follow up appointments.  If you are unable to keep your follow up appointment, call your Doctor's office to let them know, and reschedule.  Make sure that you have enough medication to last until your appointment. Be sure to get plenty of rest. Going to bed at the same time each night will help. Try to avoid sleeping during the day.  Increase your activity as tolerated. Regular exercise will help you to sleep better and improve your mental health. Eating a heart healthy diet is recommended. Try to avoid salty or fried foods. Be sure to avoid all alcohol and illegal drugs.   Increase activity slowly  As directed        Medication List    STOP taking these medications       ALIGN 4 MG Caps      TAKE these medications     Indication   ALPRAZolam 0.5 MG tablet  Commonly known as:  XANAX  Take 1 tablet (0.5 mg total) by mouth 3 (three) times daily as needed for anxiety.   Indication:  Feeling Anxious  ferrous sulfate 325 (65 FE) MG tablet  Take 1 tablet (325 mg total) by mouth daily with breakfast. For low iron.   Indication:  Iron Deficiency     FLUoxetine 40 MG capsule  Commonly known as:  PROZAC  Take one tablet each day for depression. (40mg  )   Indication:  Depression     hydrochlorothiazide 25 MG tablet  Commonly known as:  HYDRODIURIL  Take 1 tablet (25 mg total) by mouth daily. For edema and hypertension.   Indication:  Edema, High Blood Pressure     meloxicam 7.5 MG tablet  Commonly known as:  MOBIC  Take two tablets each day of joint pain. 15mg    Indication:  Joint Damage causing Pain and Loss of Function     pantoprazole 40 MG tablet  Commonly known as:  PROTONIX  Take 1 tablet (40 mg total) by mouth daily. For reflux.   Indication:  Gastroesophageal Reflux Disease      potassium chloride SA 20 MEQ tablet  Commonly known as:  K-DUR,KLOR-CON  Take one tablet two times a day for low potassium.   Indication:  Low Amount of Potassium in the Blood     propranolol 20 MG tablet  Commonly known as:  INDERAL  Take 1 tablet (20 mg total) by mouth 2 (two) times daily. For anxiety and hypertension.   Indication:  High Blood Pressure     warfarin 2.5 MG tablet  Commonly known as:  COUMADIN  Take 1 tablet (2.5 mg total) by mouth daily. For blood thinning.   Indication:  Blood Vessel Obstruction by a Blood Clot           Follow-up Information   Follow up with Lewayne Bunting, MD. Call in 1 day. (for discussion of continuation of diuretic use)    Specialty:  Cardiology   Contact information:   [address] [place] [address] [phone]       Follow-up recommendations:   Activities: Resume activity as tolerated. Diet: Heart healthy low sodium diet Tests: Follow up testing will be determined by your out patient provider. Comments:    Total Discharge Time:  Greater than 30 minutes.  Signed: MASHBURN,NEIL 03/28/2013, 10:15 AM  Patient is seen face-to-face for psychiatric evaluation, suicide risk assessment, case discussed with the physician extender and made discharge plan.Reviewed the information documented and agree with the treatment plan.  Adal Sereno,JANARDHAHA R. 04/03/2013 3:46 PM

## 2013-03-28 NOTE — Progress Notes (Signed)
Patient ID: [name] [name], male   DOB: 11/26/1940, 72 y.o.   MRN: [id number] D:  Patient discharged today.  All belongings retrieved from room and from locker in the search room.  Denies depression or hopelessness today.  Denies thoughts of self harm.  Affect is bright and he is interacting with peers.  A:  Reviewed all discharge instructions, medications, and follow up care.  Patient given a two week supply of medications from the hospital pharmacy.   R:  Verbalized understanding of all instructions.  Pleasant and cooperative with all staff and peers.  Patient was escorted to the search room for belongings and to the front lobby to wait for his wife.

## 2013-03-28 NOTE — Progress Notes (Addendum)
Patient ID: [name] [name], male   DOB: February 12, 1941, 72 y.o.   MRN: [id number] D)  Was taken to the ED earlier today for elevated BP, returned this evening just before 8 pm and went to group, shared his experience and frustration that he wasn't discharged today as he had been told he would be, and that MD wouldn't meet with him today.  Was given missed meds after group.  Had been given xanax prior to discharge from ED.  BP checked after group was 137/80.  Interacting appropriately with staf and peers, pleasant, but states ready for discharge. A)   Will continue to monitor for safety, continue POC, support R)   Safety maintained, appreciative,

## 2013-03-28 NOTE — [facility] Suicide Risk Assessment (Signed)
Suicide Risk Assessment  Discharge Assessment     Demographic Factors:  Male, Age 72 or older and Caucasian  Mental Status Per Nursing Assessment::   On Admission:     Current Mental Status by Physician: NA  Loss Factors: Financial problems/change in socioeconomic status  Historical Factors: Prior suicide attempts, Family history of mental illness or substance abuse and Impulsivity  Risk Reduction Factors:   Sense of responsibility to family, Religious beliefs about death, Living with another person, especially a relative, Positive social support, Positive therapeutic relationship and Positive coping skills or problem solving skills  Continued Clinical Symptoms:  Severe Anxiety and/or Agitation Depression:   Recent sense of peace/wellbeing  Cognitive Features That Contribute To Risk:  Polarized thinking    Suicide Risk:  Minimal: No identifiable suicidal ideation.  Patients presenting with no risk factors but with morbid ruminations; may be classified as minimal risk based on the severity of the depressive symptoms  Discharge Diagnoses:   AXIS I:  Generalized Anxiety Disorder and Major Depression, Recurrent severe AXIS II:  Deferred AXIS III:   Past Medical History  Diagnosis Date  . Paroxysmal atrial fibrillation   . HTN (hypertension)   . Hyperlipidemia   . Migraines   . Arthritis of knee, right     end-stage  . Bursitis   . Gout   . Tinnitus   . Depression   . Bronchitis   . Heart murmur   . Anemia     since post op- knee replacement   . Complication of anesthesia     irritation in throat postop, makes a-fib worse   . DJD (degenerative joint disease)     OA- "everywhere"  . Anxiety     takes xanax 2 times per day, taken mostly for ringing in ears    . PONV (postoperative nausea and vomiting)   . Family history of anesthesia complication     sister with nausia and vomiting   AXIS IV:  other psychosocial or environmental problems, problems related to  social environment and problems with primary support group AXIS V:  61-70 mild symptoms  Plan Of Care/Follow-up recommendations:  Activity:  As tolerated Diet:  Regular  Is patient on multiple antipsychotic therapies at discharge:  No   Has Patient had three or more failed trials of antipsychotic monotherapy by history:  No  Recommended Plan for Multiple Antipsychotic Therapies: NA  Forestine Macho,JANARDHAHA R., MD 03/28/2013, 11:35 AM

## 2013-03-28 NOTE — Progress Notes (Signed)
[name] [name] :  Will you be returning to the same living situation after discharge: Yes,  Patient will return home with wife. At discharge, do you have transportation home?:Yes,  Wife will transport patient home. Do you have the ability to pay for your medications:Yes,  Patient can afford medications.  Release of information consent forms completed and in the chart;  Patient's signature needed at discharge.  Patient to Follow up at: Follow-up Information   Follow up with Lewayne Bunting, MD. Call in 1 day. (for discussion of continuation of diuretic use)    Specialty:  Cardiology   Contact information:   [address] [place] [address] [phone]       Follow up with Dr. Betti Cruz - [facility] On 04/07/2013. (You are scheduled with Dr. Betti Cruz on at 1:30  PM on Thursday, April 07, 2013)    Contact information:   [address] [place], [place]   [address]  [phone]      Patient denies SI/HI:   Patient no longer endorsing SI/HI or other thoughts of self harm.  Safety Planning and Suicide Prevention discussed: .Reviewed with all patients during discharge planning group   [employer], Joesph July 03/28/2013, 11:36 AM

## 2013-03-28 NOTE — [facility] Suicide Risk Assessment (Signed)
[facility] INPATIENT:  Family/Significant Other Suicide Prevention Education  Suicide Prevention Education:  Education Completed; [name] [name], Wife, [phone]; has been identified by the patient as the family member/significant other with whom the patient will be residing, and identified as the person(s) who will aid the patient in the event of a mental health crisis (suicidal ideations/suicide attempt).  With written consent from the patient, the family member/significant other has been provided the following suicide prevention education, prior to the and/or following the discharge of the patient.  The suicide prevention education provided includes the following:  Suicide risk factors  Suicide prevention and interventions  National Suicide Hotline telephone number  [facility] assessment telephone number  [facility] Emergency Assistance 911  [facility] and/or Residential Mobile Crisis Unit telephone number  Request made of family/significant other to:  Remove weapons (e.g., guns, rifles, knives), all items previously/currently identified as safety concern.  Wife advised patient does not have access to guns.  Remove drugs/medications (over-the-counter, prescriptions, illicit drugs), all items previously/currently identified as a safety concern.  The family member/significant other verbalizes understanding of the suicide prevention education information provided.  The family member/significant other agrees to remove the items of safety concern listed above.  Ersie Savino Hairston 03/28/2013, 11:30 AM

## 2013-03-28 NOTE — Progress Notes (Signed)
[facility] Group Notes:  (Nursing/MHT/Case Management/Adjunct)  Date:  03/28/2013  Time:  3:43 PM  Type of Therapy:  Psychoeducational Skills  Participation Level:  Did Not Attend   Summary of Progress/Problems: Pt was taken to [facility] for high BP  Caswell Corwin 03/28/2013, 3:43 PM

## 2013-03-28 NOTE — Progress Notes (Signed)
Patient ID: [name] [name], male   DOB: Feb 28, 1941, 72 y.o.   MRN: [id number] PER STATE REGULATIONS 482.30  THIS CHART WAS REVIEWED FOR MEDICAL NECESSITY WITH RESPECT TO THE PATIENT'S ADMISSION/ DURATION OF STAY.  NEXT REVIEW DATE: 03/28/2013  Willa Rough, RN, BSN CASE MANAGER

## 2013-03-28 NOTE — Tx Team (Signed)
Interdisciplinary Treatment Plan Update   Date Reviewed:  03/28/2013  Time Reviewed:  9:41 AM  Progress in Treatment:   Attending groups: Yes Participating in groups: Yes Taking medication as prescribed: Yes  Tolerating medication: Yes Family/Significant other contact made: Yes  Patient understands diagnosis: Yes  Discussing patient identified problems/goals with staff: Yes Medical problems stabilized or resolved: Yes Denies suicidal/homicidal ideation: Yes Patient has not harmed self or others: Yes  For review of initial/current patient goals, please see plan of care.  Estimated Length of Stay:  Discharge today  Reasons for Continued Hospitalization:   New Problems/Goals identified:    Discharge Plan or Barriers:   Home with outpatient follow up at [facility]  Additional Comments: N/A  Attendees:  Patient:  [name] [name] 03/28/2013 9:41 AM   Signature: Mervyn Gay, MD 03/28/2013 9:41 AM  Signature:  Verne Spurr, PA 03/28/2013 9:41 AM  Signature:  Cammy Brochure, RN 03/28/2013 9:41 AM  Signature:Beverly Terrilee Croak, RN 03/28/2013 9:41 AM  Signature:  Neill Loft RN 03/28/2013 9:41 AM  Signature:  Juline Patch, LCSW 03/28/2013 9:41 AM  Signature:   03/28/2013 9:41 AM  Signature:Delora Joanne Gavel, Care Coordinator 03/28/2013 9:41 AM  Signature: 03/28/2013 9:41 AM   03/28/2013  9:41 AM   03/28/2013  9:41 AM   03/28/2013  9:41 AM    Scribe for Treatment Team:   Juline Patch,  03/28/2013 9:41 AM

## 2013-03-28 NOTE — [facility] Group Notes (Signed)
[facility] LCSW Aftercare Discharge Planning Group Note   03/28/2013 11:35 AM    Participation Quality:  Appropriate  1  Mood/Affect:  Appropriate   1  Depression Rating:    Anxiety Rating:    Thoughts of Suicide:  No  Will you contract for safety?   NA  Current AVH:  No  Plan for Discharge/Comments:  Patient attending discharge planning group and actively participated in group.  He advised he is doing well and plans to discharge home today.  He will follow up with [facility].  CSW provided all participants with daily workbook.   Transportation Means: Patient has transportation.   Supports:  Patient has a support system.   Bruce Churilla, Joesph July

## 2013-03-28 NOTE — Progress Notes (Signed)
Adult Psychoeducational Group Note  Date:  03/28/2013 Time:  11:00am Group Topic/Focus:  Self Care:   The focus of this group is to help patients understand the importance of self-care in order to improve or restore emotional, physical, spiritual, interpersonal, and financial health.  Participation Level:  Active  Participation Quality:  Appropriate and Attentive  Affect:  Appropriate  Cognitive:  Alert and Appropriate  Insight: Appropriate  Engagement in Group:  Engaged  Modes of Intervention:  Discussion and Education  Additional Comments:  Pt attended and participated in group. Discussion was on wellness and self care. Pt. Talked about the one thing that made them angry and how they deal with it and what is a more positive way of dealing with their anger. Pt also stated one thing that makes them happy and how they can think of that when they begin to feel anxiety or depression coming on.   Shelly Bombard D 03/28/2013, 1:16 PM

## 2013-03-28 NOTE — ED Provider Notes (Signed)
Medical screening examination/treatment/procedure(s) were conducted as a shared visit with non-physician practitioner(s) and myself.  I personally evaluated the patient during the encounter.  EKG Interpretation     Ventricular Rate:  68 PR Interval:  232 QRS Duration: 94 QT Interval:  409 QTC Calculation: 435 R Axis:   4 Text Interpretation:  Atrial-paced rhythm Baseline wander in lead(s) V5 ATRIAL PACED RHYTHM has replaced sinus rhythm            I interviewed and examined the patient. Lungs are CTAB. Cardiac exam wnl. Abdomen soft.  BP on my exam is 180/90. Pt recently started on new diuretic 2 days ago. I suspect a component of his elevated BP is d/t the new environment in [facility] and increased emotional stress. No evidence of end organ damage. He appears well on exam. Will rec close f/u w/ pcp.   Junius Argyle, MD 03/28/13 1538

## 2013-03-29 ENCOUNTER — Telehealth: Payer: Self-pay | Admitting: Internal Medicine

## 2013-03-29 NOTE — Telephone Encounter (Signed)
New message    B/p averaging 156/85.  He want to talk to [facility] about this.

## 2013-03-29 NOTE — Telephone Encounter (Signed)
Spoke with patient.  He has had a lot of stress dealing with the loss of a cat and then some social issues at home.  I have told him to focus on walking at he gym and try to wait this out I really feel his BP will level out in the next several days to a week.  He will let me know if it does not.

## 2013-03-29 NOTE — Consult Note (Signed)
Note reviewed and agreed with  

## 2013-03-31 NOTE — Progress Notes (Signed)
Patient Discharge Instructions:  After Visit Summary (AVS):   Faxed to:  03/31/13 Psychiatric Admission Assessment Note:   Faxed to:  03/31/13 Suicide Risk Assessment - Discharge Assessment:   Faxed to:  03/31/13 Faxed/Sent to the Next Level Care provider:  03/31/13 Faxed to [facility] @ [phone]  Jerelene Redden, 03/31/2013, 3:26 PM

## 2013-04-04 ENCOUNTER — Other Ambulatory Visit: Payer: Self-pay | Admitting: Internal Medicine

## 2013-04-05 ENCOUNTER — Ambulatory Visit (INDEPENDENT_AMBULATORY_CARE_PROVIDER_SITE_OTHER): Payer: Medicare Other | Admitting: Pharmacist

## 2013-04-05 DIAGNOSIS — Z7901 Long term (current) use of anticoagulants: Secondary | ICD-10-CM

## 2013-04-05 DIAGNOSIS — I4891 Unspecified atrial fibrillation: Secondary | ICD-10-CM | POA: Diagnosis not present

## 2013-04-05 LAB — POCT INR: INR: 3.5

## 2013-04-07 DIAGNOSIS — F429 Obsessive-compulsive disorder, unspecified: Secondary | ICD-10-CM | POA: Diagnosis not present

## 2013-04-19 ENCOUNTER — Ambulatory Visit (INDEPENDENT_AMBULATORY_CARE_PROVIDER_SITE_OTHER): Payer: Medicare Other | Admitting: General Practice

## 2013-04-19 DIAGNOSIS — Z7901 Long term (current) use of anticoagulants: Secondary | ICD-10-CM

## 2013-04-19 DIAGNOSIS — I4891 Unspecified atrial fibrillation: Secondary | ICD-10-CM | POA: Diagnosis not present

## 2013-04-19 LAB — POCT INR: INR: 2.2

## 2013-05-10 ENCOUNTER — Telehealth: Payer: Self-pay

## 2013-05-10 ENCOUNTER — Ambulatory Visit (INDEPENDENT_AMBULATORY_CARE_PROVIDER_SITE_OTHER): Payer: Medicare Other

## 2013-05-10 DIAGNOSIS — Z7901 Long term (current) use of anticoagulants: Secondary | ICD-10-CM | POA: Diagnosis not present

## 2013-05-10 DIAGNOSIS — I4891 Unspecified atrial fibrillation: Secondary | ICD-10-CM

## 2013-05-10 MED ORDER — CARVEDILOL 12.5 MG PO TABS: 12.5000 mg | ORAL_TABLET | Freq: Two times a day (BID) | ORAL | Status: DC

## 2013-05-10 NOTE — Telephone Encounter (Signed)
Advised pt of new orders. Pt agreeable to plan.

## 2013-05-10 NOTE — Telephone Encounter (Addendum)
Per Dr Lubertha Basque last office note if BP remains elevated would stop Propanolol and  start on Carvedilol 12.5mg  twice daily and keep a log of BP after starting the new medication

## 2013-05-10 NOTE — Telephone Encounter (Signed)
Pt seen in [facility] today, BP checked at pt's request 176/94 today.  Pt states his BP has been elevated for the last few weeks diastolic pressures running above 90 consistantly, systolic pressures running 160-170's consistently.  Pt states he feels fine overall, not symptomatic, but concerned about BP elevation.  Please call and advise.  Thanks.

## 2013-05-24 DIAGNOSIS — F429 Obsessive-compulsive disorder, unspecified: Secondary | ICD-10-CM | POA: Diagnosis not present

## 2013-06-07 ENCOUNTER — Ambulatory Visit (INDEPENDENT_AMBULATORY_CARE_PROVIDER_SITE_OTHER): Payer: Medicare Other | Admitting: Pharmacist

## 2013-06-07 VITALS — BP 162/94

## 2013-06-07 DIAGNOSIS — I4891 Unspecified atrial fibrillation: Secondary | ICD-10-CM | POA: Diagnosis not present

## 2013-06-07 DIAGNOSIS — Z7901 Long term (current) use of anticoagulants: Secondary | ICD-10-CM

## 2013-06-07 LAB — POCT INR: INR: 2

## 2013-06-27 ENCOUNTER — Ambulatory Visit (INDEPENDENT_AMBULATORY_CARE_PROVIDER_SITE_OTHER): Payer: Medicare Other | Admitting: *Deleted

## 2013-06-27 DIAGNOSIS — I495 Sick sinus syndrome: Secondary | ICD-10-CM

## 2013-06-27 DIAGNOSIS — I4891 Unspecified atrial fibrillation: Secondary | ICD-10-CM | POA: Diagnosis not present

## 2013-07-05 ENCOUNTER — Ambulatory Visit (INDEPENDENT_AMBULATORY_CARE_PROVIDER_SITE_OTHER): Payer: Medicare Other | Admitting: Pharmacist

## 2013-07-05 DIAGNOSIS — Z7901 Long term (current) use of anticoagulants: Secondary | ICD-10-CM | POA: Diagnosis not present

## 2013-07-05 DIAGNOSIS — I4891 Unspecified atrial fibrillation: Secondary | ICD-10-CM

## 2013-07-05 LAB — MDC_IDC_ENUM_SESS_TYPE_REMOTE
Brady Statistic AP VP Percent: 0.1 %
Brady Statistic AP VS Percent: 99.5 %
Brady Statistic AS VP Percent: 0.1 %
Lead Channel Impedance Value: 417 Ohm
Lead Channel Pacing Threshold Amplitude: 0.5 V
MDC IDC MSMT LEADCHNL RA PACING THRESHOLD PULSEWIDTH: 0.4 ms
MDC IDC MSMT LEADCHNL RV IMPEDANCE VALUE: 608 Ohm
MDC IDC MSMT LEADCHNL RV PACING THRESHOLD AMPLITUDE: 0.75 V
MDC IDC MSMT LEADCHNL RV PACING THRESHOLD PULSEWIDTH: 0.4 ms
MDC IDC MSMT LEADCHNL RV SENSING INTR AMPL: 16 mV
MDC IDC STAT BRADY AS VS PERCENT: 0.4 %

## 2013-07-05 LAB — POCT INR: INR: 1.7

## 2013-07-19 ENCOUNTER — Encounter: Payer: Self-pay | Admitting: *Deleted

## 2013-07-26 ENCOUNTER — Ambulatory Visit (INDEPENDENT_AMBULATORY_CARE_PROVIDER_SITE_OTHER): Payer: Medicare Other | Admitting: Pharmacist

## 2013-07-26 DIAGNOSIS — Z7901 Long term (current) use of anticoagulants: Secondary | ICD-10-CM

## 2013-07-26 DIAGNOSIS — I4891 Unspecified atrial fibrillation: Secondary | ICD-10-CM

## 2013-07-26 LAB — POCT INR: INR: 2.8

## 2013-07-27 ENCOUNTER — Encounter: Payer: Self-pay | Admitting: Internal Medicine

## 2013-07-28 DIAGNOSIS — D235 Other benign neoplasm of skin of trunk: Secondary | ICD-10-CM | POA: Diagnosis not present

## 2013-07-28 DIAGNOSIS — I781 Nevus, non-neoplastic: Secondary | ICD-10-CM | POA: Diagnosis not present

## 2013-07-28 DIAGNOSIS — L570 Actinic keratosis: Secondary | ICD-10-CM | POA: Diagnosis not present

## 2013-07-28 DIAGNOSIS — Z85828 Personal history of other malignant neoplasm of skin: Secondary | ICD-10-CM | POA: Diagnosis not present

## 2013-08-01 ENCOUNTER — Encounter: Payer: Self-pay | Admitting: Pharmacist

## 2013-08-02 ENCOUNTER — Other Ambulatory Visit: Payer: Self-pay | Admitting: Internal Medicine

## 2013-08-17 ENCOUNTER — Ambulatory Visit (INDEPENDENT_AMBULATORY_CARE_PROVIDER_SITE_OTHER): Payer: Medicare Other | Admitting: *Deleted

## 2013-08-17 DIAGNOSIS — I4891 Unspecified atrial fibrillation: Secondary | ICD-10-CM | POA: Diagnosis not present

## 2013-08-17 DIAGNOSIS — Z7901 Long term (current) use of anticoagulants: Secondary | ICD-10-CM

## 2013-08-17 LAB — POCT INR: INR: 3.5

## 2013-08-23 DIAGNOSIS — Z7901 Long term (current) use of anticoagulants: Secondary | ICD-10-CM | POA: Diagnosis not present

## 2013-08-23 DIAGNOSIS — I4891 Unspecified atrial fibrillation: Secondary | ICD-10-CM | POA: Diagnosis not present

## 2013-08-23 DIAGNOSIS — Z6830 Body mass index (BMI) 30.0-30.9, adult: Secondary | ICD-10-CM | POA: Diagnosis not present

## 2013-08-23 DIAGNOSIS — T8489XA Other specified complication of internal orthopedic prosthetic devices, implants and grafts, initial encounter: Secondary | ICD-10-CM | POA: Diagnosis not present

## 2013-08-23 DIAGNOSIS — M25569 Pain in unspecified knee: Secondary | ICD-10-CM | POA: Diagnosis not present

## 2013-08-23 DIAGNOSIS — Z96659 Presence of unspecified artificial knee joint: Secondary | ICD-10-CM | POA: Diagnosis not present

## 2013-08-23 DIAGNOSIS — Z954 Presence of other heart-valve replacement: Secondary | ICD-10-CM | POA: Diagnosis not present

## 2013-08-25 DIAGNOSIS — F429 Obsessive-compulsive disorder, unspecified: Secondary | ICD-10-CM | POA: Diagnosis not present

## 2013-09-05 ENCOUNTER — Ambulatory Visit (INDEPENDENT_AMBULATORY_CARE_PROVIDER_SITE_OTHER): Payer: Medicare Other

## 2013-09-05 DIAGNOSIS — Z7901 Long term (current) use of anticoagulants: Secondary | ICD-10-CM | POA: Diagnosis not present

## 2013-09-05 DIAGNOSIS — I4891 Unspecified atrial fibrillation: Secondary | ICD-10-CM | POA: Diagnosis not present

## 2013-09-05 LAB — POCT INR: INR: 1.7

## 2013-09-19 ENCOUNTER — Ambulatory Visit (INDEPENDENT_AMBULATORY_CARE_PROVIDER_SITE_OTHER): Payer: Medicare Other | Admitting: Pharmacist

## 2013-09-19 DIAGNOSIS — Z7901 Long term (current) use of anticoagulants: Secondary | ICD-10-CM | POA: Diagnosis not present

## 2013-09-19 DIAGNOSIS — I4891 Unspecified atrial fibrillation: Secondary | ICD-10-CM | POA: Diagnosis not present

## 2013-09-19 LAB — POCT INR: INR: 1.8

## 2013-09-27 ENCOUNTER — Ambulatory Visit (INDEPENDENT_AMBULATORY_CARE_PROVIDER_SITE_OTHER): Payer: Medicare Other | Admitting: *Deleted

## 2013-09-27 DIAGNOSIS — I495 Sick sinus syndrome: Secondary | ICD-10-CM

## 2013-09-30 ENCOUNTER — Encounter: Payer: Self-pay | Admitting: Internal Medicine

## 2013-09-30 DIAGNOSIS — I495 Sick sinus syndrome: Secondary | ICD-10-CM | POA: Diagnosis not present

## 2013-10-01 LAB — MDC_IDC_ENUM_SESS_TYPE_REMOTE
Battery Remaining Longevity: 144 mo
Brady Statistic AP VP Percent: 0 %
Brady Statistic AP VS Percent: 99 %
Brady Statistic AS VS Percent: 1 %
Date Time Interrogation Session: 20150508121258
Lead Channel Impedance Value: 522 Ohm
Lead Channel Pacing Threshold Pulse Width: 0.4 ms
Lead Channel Pacing Threshold Pulse Width: 0.4 ms
Lead Channel Sensing Intrinsic Amplitude: 8 mV
Lead Channel Setting Pacing Amplitude: 2 V
Lead Channel Setting Sensing Sensitivity: 4 mV
MDC IDC MSMT BATTERY IMPEDANCE: 133 Ohm
MDC IDC MSMT BATTERY VOLTAGE: 2.8 V
MDC IDC MSMT LEADCHNL RA IMPEDANCE VALUE: 429 Ohm
MDC IDC MSMT LEADCHNL RA PACING THRESHOLD AMPLITUDE: 0.375 V
MDC IDC MSMT LEADCHNL RV PACING THRESHOLD AMPLITUDE: 1 V
MDC IDC SET LEADCHNL RV PACING AMPLITUDE: 2.5 V
MDC IDC SET LEADCHNL RV PACING PULSEWIDTH: 0.34 ms
MDC IDC STAT BRADY AS VP PERCENT: 0 %

## 2013-10-03 ENCOUNTER — Ambulatory Visit (INDEPENDENT_AMBULATORY_CARE_PROVIDER_SITE_OTHER): Payer: Medicare Other

## 2013-10-03 DIAGNOSIS — Z7901 Long term (current) use of anticoagulants: Secondary | ICD-10-CM | POA: Diagnosis not present

## 2013-10-03 DIAGNOSIS — I4891 Unspecified atrial fibrillation: Secondary | ICD-10-CM | POA: Diagnosis not present

## 2013-10-03 LAB — POCT INR: INR: 1.8

## 2013-10-06 ENCOUNTER — Encounter: Payer: Self-pay | Admitting: Cardiology

## 2013-10-07 NOTE — Progress Notes (Signed)
Remote pacemaker transmission.   

## 2013-10-19 ENCOUNTER — Ambulatory Visit (INDEPENDENT_AMBULATORY_CARE_PROVIDER_SITE_OTHER): Payer: Medicare Other | Admitting: *Deleted

## 2013-10-19 DIAGNOSIS — Z7901 Long term (current) use of anticoagulants: Secondary | ICD-10-CM | POA: Diagnosis not present

## 2013-10-19 DIAGNOSIS — I4891 Unspecified atrial fibrillation: Secondary | ICD-10-CM

## 2013-10-19 LAB — POCT INR: INR: 2.9

## 2013-10-20 ENCOUNTER — Telehealth: Payer: Self-pay

## 2013-10-20 DIAGNOSIS — Z79899 Other long term (current) drug therapy: Secondary | ICD-10-CM

## 2013-10-20 DIAGNOSIS — I10 Essential (primary) hypertension: Secondary | ICD-10-CM

## 2013-10-20 MED ORDER — LOSARTAN POTASSIUM 50 MG PO TABS: 50.0000 mg | ORAL_TABLET | Freq: Every day | ORAL | Status: DC

## 2013-10-20 NOTE — Telephone Encounter (Signed)
Spoke with pt's wife advised to have pt call us back when he returns home.

## 2013-10-20 NOTE — Telephone Encounter (Signed)
Message copied by Theophilus Kinds on Thu Oct 20, 2013  9:33 AM ------      Message from: Dionicio Stall      Created: Thu Oct 20, 2013  7:56 AM      Regarding: FW: Elevated BP       Start Losartan 50mg  daily and follow up in one week for BP check and BMP per Dr Jacolyn Reedy      ----- Message -----         From: Zenovia Jarred, RN         Sent: 10/19/2013  10:38 AM           To: Dionicio Stall, RN, Evans Lance, MD      Subject: Elevated BP                                              Today in [facility] BP 162/98. Pt was started on Coreg BID in December but pt  discontinued in March and restarted Inderal.   Ria Bush in coumadin discussed this with you on 08/17/13 during [facility] visit, BP at that time was 150/90. He has continued his Inderal TID. He states BP has been running high at home checks as well. Denies any stressful events. Please call pt with further instructions.       FYI: He states that when he took Coreg for those 3 mths his BP didn't improve. H- #  [id number], ok to leave message.        ------

## 2013-10-20 NOTE — Telephone Encounter (Signed)
Spoke with pt advised per Dr Lovena Le pt is to start taking Losartan 50mg  once daily.  Rx sent to pharmacy as pt requested.  Made appt for BP check and labwork next week on 10/28/13.  Pt aware of appt date and time, verbalized understanding of MD instructions.

## 2013-10-28 ENCOUNTER — Other Ambulatory Visit (INDEPENDENT_AMBULATORY_CARE_PROVIDER_SITE_OTHER): Payer: Medicare Other

## 2013-10-28 ENCOUNTER — Ambulatory Visit: Payer: Medicare Other

## 2013-10-28 ENCOUNTER — Other Ambulatory Visit: Payer: Self-pay

## 2013-10-28 VITALS — BP 148/98 | HR 62 | Ht 67.0 in | Wt 192.2 lb

## 2013-10-28 DIAGNOSIS — I4891 Unspecified atrial fibrillation: Secondary | ICD-10-CM

## 2013-10-28 DIAGNOSIS — I10 Essential (primary) hypertension: Secondary | ICD-10-CM | POA: Diagnosis not present

## 2013-10-28 DIAGNOSIS — R0789 Other chest pain: Secondary | ICD-10-CM

## 2013-10-28 DIAGNOSIS — Z79899 Other long term (current) drug therapy: Secondary | ICD-10-CM

## 2013-10-28 LAB — BASIC METABOLIC PANEL
BUN: 12 mg/dL (ref 6–23)
CO2: 30 meq/L (ref 19–32)
CREATININE: 0.9 mg/dL (ref 0.4–1.5)
Calcium: 9.3 mg/dL (ref 8.4–10.5)
Chloride: 103 mEq/L (ref 96–112)
GFR: 84.31 mL/min (ref >60.00)
GLUCOSE: 74 mg/dL (ref 70–99)
Potassium: 4.7 mEq/L (ref 3.5–5.1)
Sodium: 138 mEq/L (ref 135–145)

## 2013-10-28 NOTE — Patient Instructions (Addendum)
Continue same medications   Appointment with Dr.Taylor to evaluate chest tightness Thursday 11/03/13 at 4:15 pm   Go to [facility] ER if needed

## 2013-10-28 NOTE — Progress Notes (Signed)
1.) Reason for visit: B/P check  2.) Name of MD requesting visit: Dr.Taylor  3.) H&P: Patient stated for the past 4 to 5 weeks he has been having chest tightness off and on.Also stated his right arm and right leg has tremors.Stated he notices when he is sitting or lying down.EKG was done.DOD Dr.Nahser reviewed Advised needs appointment.Advised to go to ER if needed.Appointment scheduled with Dr.Taylor Thursday 11/03/13.Advised continue same medications.  4.) ROS related to problem: elevated B/P,chest tightness  5.) Assessment and plan per MD:

## 2013-11-02 ENCOUNTER — Ambulatory Visit (INDEPENDENT_AMBULATORY_CARE_PROVIDER_SITE_OTHER): Payer: Medicare Other | Admitting: *Deleted

## 2013-11-02 ENCOUNTER — Telehealth: Payer: Self-pay | Admitting: *Deleted

## 2013-11-02 DIAGNOSIS — Z7901 Long term (current) use of anticoagulants: Secondary | ICD-10-CM | POA: Diagnosis not present

## 2013-11-02 DIAGNOSIS — I4891 Unspecified atrial fibrillation: Secondary | ICD-10-CM | POA: Diagnosis not present

## 2013-11-02 LAB — POCT INR: INR: 2

## 2013-11-02 NOTE — Telephone Encounter (Signed)
Pt seen in [facility] today, BP checked 132/80. He is taking Losartan was prescribed. Due for f/u in November. Did have appt for 11/03/13 with Dr Lovena Le for f/u with CP and tremors, pt cancelled appt because these symptoms have resolved. Aware to call if they return.

## 2013-11-03 ENCOUNTER — Encounter: Payer: Self-pay | Admitting: Internal Medicine

## 2013-11-15 DIAGNOSIS — N138 Other obstructive and reflux uropathy: Secondary | ICD-10-CM | POA: Diagnosis not present

## 2013-11-15 DIAGNOSIS — N401 Enlarged prostate with lower urinary tract symptoms: Secondary | ICD-10-CM | POA: Diagnosis not present

## 2013-11-21 DIAGNOSIS — F429 Obsessive-compulsive disorder, unspecified: Secondary | ICD-10-CM | POA: Diagnosis not present

## 2013-11-28 ENCOUNTER — Ambulatory Visit (INDEPENDENT_AMBULATORY_CARE_PROVIDER_SITE_OTHER): Payer: Medicare Other | Admitting: Pharmacist

## 2013-11-28 DIAGNOSIS — I4891 Unspecified atrial fibrillation: Secondary | ICD-10-CM | POA: Diagnosis not present

## 2013-11-28 DIAGNOSIS — Z7901 Long term (current) use of anticoagulants: Secondary | ICD-10-CM

## 2013-11-28 LAB — POCT INR: INR: 2.8

## 2013-12-20 DIAGNOSIS — M25569 Pain in unspecified knee: Secondary | ICD-10-CM | POA: Diagnosis not present

## 2013-12-20 DIAGNOSIS — M259 Joint disorder, unspecified: Secondary | ICD-10-CM | POA: Diagnosis not present

## 2013-12-20 DIAGNOSIS — G8929 Other chronic pain: Secondary | ICD-10-CM | POA: Diagnosis not present

## 2013-12-20 DIAGNOSIS — R52 Pain, unspecified: Secondary | ICD-10-CM | POA: Diagnosis not present

## 2013-12-20 DIAGNOSIS — Z96659 Presence of unspecified artificial knee joint: Secondary | ICD-10-CM | POA: Diagnosis not present

## 2013-12-20 DIAGNOSIS — T8489XA Other specified complication of internal orthopedic prosthetic devices, implants and grafts, initial encounter: Secondary | ICD-10-CM | POA: Diagnosis not present

## 2013-12-26 ENCOUNTER — Ambulatory Visit (INDEPENDENT_AMBULATORY_CARE_PROVIDER_SITE_OTHER): Payer: Medicare Other | Admitting: Pharmacist

## 2013-12-26 DIAGNOSIS — I4891 Unspecified atrial fibrillation: Secondary | ICD-10-CM

## 2013-12-26 DIAGNOSIS — Z7901 Long term (current) use of anticoagulants: Secondary | ICD-10-CM | POA: Diagnosis not present

## 2013-12-26 LAB — POCT INR: INR: 2.1

## 2013-12-28 ENCOUNTER — Ambulatory Visit (INDEPENDENT_AMBULATORY_CARE_PROVIDER_SITE_OTHER): Payer: Medicare Other | Admitting: *Deleted

## 2013-12-28 DIAGNOSIS — I495 Sick sinus syndrome: Secondary | ICD-10-CM

## 2013-12-28 LAB — MDC_IDC_ENUM_SESS_TYPE_REMOTE
Battery Impedance: 133 Ohm
Battery Remaining Longevity: 147 mo
Battery Voltage: 2.8 V
Brady Statistic AP VP Percent: 0 %
Brady Statistic AP VS Percent: 99 %
Brady Statistic AS VP Percent: 0 %
Brady Statistic AS VS Percent: 1 %
Date Time Interrogation Session: 20150805145241
Lead Channel Impedance Value: 507 Ohm
Lead Channel Impedance Value: 566 Ohm
Lead Channel Pacing Threshold Amplitude: 0.5 V
Lead Channel Pacing Threshold Amplitude: 0.875 V
Lead Channel Pacing Threshold Pulse Width: 0.4 ms
Lead Channel Pacing Threshold Pulse Width: 0.4 ms
Lead Channel Sensing Intrinsic Amplitude: 16 mV
Lead Channel Setting Pacing Amplitude: 2 V
Lead Channel Setting Pacing Amplitude: 2.5 V
Lead Channel Setting Pacing Pulse Width: 0.34 ms
Lead Channel Setting Sensing Sensitivity: 4 mV

## 2013-12-28 NOTE — Progress Notes (Signed)
Remote pacemaker transmission.   

## 2014-01-11 ENCOUNTER — Encounter: Payer: Self-pay | Admitting: Cardiology

## 2014-01-26 ENCOUNTER — Encounter: Payer: Self-pay | Admitting: Internal Medicine

## 2014-02-01 ENCOUNTER — Encounter: Payer: Self-pay | Admitting: Internal Medicine

## 2014-02-01 DIAGNOSIS — I10 Essential (primary) hypertension: Secondary | ICD-10-CM | POA: Diagnosis not present

## 2014-02-01 DIAGNOSIS — H9319 Tinnitus, unspecified ear: Secondary | ICD-10-CM | POA: Diagnosis not present

## 2014-02-01 DIAGNOSIS — Z1331 Encounter for screening for depression: Secondary | ICD-10-CM | POA: Diagnosis not present

## 2014-02-01 DIAGNOSIS — Z23 Encounter for immunization: Secondary | ICD-10-CM | POA: Diagnosis not present

## 2014-02-01 DIAGNOSIS — I4891 Unspecified atrial fibrillation: Secondary | ICD-10-CM | POA: Diagnosis not present

## 2014-02-01 DIAGNOSIS — M1990 Unspecified osteoarthritis, unspecified site: Secondary | ICD-10-CM | POA: Diagnosis not present

## 2014-02-01 DIAGNOSIS — G43009 Migraine without aura, not intractable, without status migrainosus: Secondary | ICD-10-CM | POA: Diagnosis not present

## 2014-02-01 DIAGNOSIS — E7800 Pure hypercholesterolemia, unspecified: Secondary | ICD-10-CM | POA: Diagnosis not present

## 2014-02-01 DIAGNOSIS — K219 Gastro-esophageal reflux disease without esophagitis: Secondary | ICD-10-CM | POA: Diagnosis not present

## 2014-02-02 DIAGNOSIS — Z85828 Personal history of other malignant neoplasm of skin: Secondary | ICD-10-CM | POA: Diagnosis not present

## 2014-02-02 DIAGNOSIS — L738 Other specified follicular disorders: Secondary | ICD-10-CM | POA: Diagnosis not present

## 2014-02-02 DIAGNOSIS — L678 Other hair color and hair shaft abnormalities: Secondary | ICD-10-CM | POA: Diagnosis not present

## 2014-02-02 DIAGNOSIS — D235 Other benign neoplasm of skin of trunk: Secondary | ICD-10-CM | POA: Diagnosis not present

## 2014-02-06 ENCOUNTER — Ambulatory Visit (INDEPENDENT_AMBULATORY_CARE_PROVIDER_SITE_OTHER): Payer: Medicare Other | Admitting: Pharmacist

## 2014-02-06 DIAGNOSIS — Z7901 Long term (current) use of anticoagulants: Secondary | ICD-10-CM

## 2014-02-06 DIAGNOSIS — I4891 Unspecified atrial fibrillation: Secondary | ICD-10-CM | POA: Diagnosis not present

## 2014-02-06 LAB — POCT INR: INR: 2

## 2014-03-10 ENCOUNTER — Other Ambulatory Visit: Payer: Self-pay | Admitting: Internal Medicine

## 2014-03-14 ENCOUNTER — Encounter: Payer: Self-pay | Admitting: Pharmacist

## 2014-03-20 ENCOUNTER — Ambulatory Visit (INDEPENDENT_AMBULATORY_CARE_PROVIDER_SITE_OTHER): Payer: Medicare Other

## 2014-03-20 DIAGNOSIS — Z7901 Long term (current) use of anticoagulants: Secondary | ICD-10-CM

## 2014-03-20 DIAGNOSIS — I4891 Unspecified atrial fibrillation: Secondary | ICD-10-CM | POA: Diagnosis not present

## 2014-03-20 LAB — POCT INR: INR: 2.3

## 2014-04-24 ENCOUNTER — Ambulatory Visit (INDEPENDENT_AMBULATORY_CARE_PROVIDER_SITE_OTHER): Payer: Medicare Other | Admitting: Internal Medicine

## 2014-04-24 ENCOUNTER — Encounter: Payer: Self-pay | Admitting: Internal Medicine

## 2014-04-24 VITALS — BP 124/70 | HR 85 | Ht 67.5 in | Wt 191.0 lb

## 2014-04-24 DIAGNOSIS — F333 Major depressive disorder, recurrent, severe with psychotic symptoms: Secondary | ICD-10-CM | POA: Diagnosis not present

## 2014-04-24 DIAGNOSIS — I480 Paroxysmal atrial fibrillation: Secondary | ICD-10-CM

## 2014-04-24 DIAGNOSIS — Z950 Presence of cardiac pacemaker: Secondary | ICD-10-CM | POA: Diagnosis not present

## 2014-04-24 DIAGNOSIS — I10 Essential (primary) hypertension: Secondary | ICD-10-CM | POA: Diagnosis not present

## 2014-04-24 LAB — MDC_IDC_ENUM_SESS_TYPE_INCLINIC
Battery Impedance: 157 Ohm
Battery Voltage: 2.8 V
Brady Statistic AP VP Percent: 0 %
Brady Statistic AP VS Percent: 99 %
Brady Statistic AS VS Percent: 1 %
Date Time Interrogation Session: 20151130102050
Lead Channel Impedance Value: 391 Ohm
Lead Channel Impedance Value: 612 Ohm
Lead Channel Pacing Threshold Amplitude: 0.5 V
Lead Channel Pacing Threshold Pulse Width: 0.34 ms
Lead Channel Pacing Threshold Pulse Width: 0.34 ms
Lead Channel Sensing Intrinsic Amplitude: 4 mV
Lead Channel Sensing Intrinsic Amplitude: 8 mV
Lead Channel Setting Pacing Amplitude: 2.5 V
Lead Channel Setting Sensing Sensitivity: 4 mV
MDC IDC MSMT BATTERY REMAINING LONGEVITY: 137 mo
MDC IDC MSMT LEADCHNL RV PACING THRESHOLD AMPLITUDE: 1 V
MDC IDC SET LEADCHNL RA PACING AMPLITUDE: 2 V
MDC IDC SET LEADCHNL RV PACING PULSEWIDTH: 0.34 ms
MDC IDC STAT BRADY AS VP PERCENT: 0 %

## 2014-04-24 NOTE — Progress Notes (Signed)
HPI [name] [name] returns today for followup. He is a very pleasant 73 year old man with paroxysmal atrial fibrillation, status post ablation, hypertension, symptomatic bradycardia, status post permanent pacemaker insertion. He has been bothered by arthritis but appears to be improving on meloxicam. He denies palpitations, chest pain, or shortness of breath. No peripheral edema.  Allergies  Allergen Reactions  . Iohexol      Code: HIVES, Desc: POST MYELOGRAM.  PT ALSO SAID THIS HAPPENED A YEAR AGO WITH A MYELOGRAM AND WITH ESI'S., Onset Date: [id number]   . Meperidine Hcl     REACTION: unspecified  . Prednisone     Redness of skin & jittery     Current Outpatient Prescriptions  Medication Sig Dispense Refill  . ALPRAZolam (XANAX) 0.5 MG tablet Take 1 tablet (0.5 mg total) by mouth 3 (three) times daily as needed for anxiety. 90 tablet 0  . ferrous sulfate 325 (65 FE) MG tablet Take 1 tablet (325 mg total) by mouth daily with breakfast. For low iron.  3  . FLUoxetine (PROZAC) 40 MG capsule Take one tablet each day for depression. (40mg  ) (Patient taking differently: Take one tablet by mouth each day for depression. (40mg  )) 30 capsule 0  . hydrochlorothiazide (HYDRODIURIL) 25 MG tablet Take 1 tablet (25 mg total) by mouth daily. For edema and hypertension. 30 tablet 0  . losartan (COZAAR) 50 MG tablet Take 1 tablet (50 mg total) by mouth daily. 30 tablet 6  . meloxicam (MOBIC) 15 MG tablet Take 15 mg by mouth daily.    . potassium chloride SA (K-DUR,KLOR-CON) 20 MEQ tablet Take one tablet two times a day for low potassium. (Patient taking differently: Take one tablet by mouth two times a day for low potassium.)    . propranolol (INDERAL) 20 MG tablet Take 20 mg by mouth 3 (three) times daily.    . tamsulosin (FLOMAX) 0.4 MG CAPS capsule Take 0.4 mg by mouth daily.     Marland Kitchen warfarin (COUMADIN) 5 MG tablet TAKE AS DIRECTED BY  ANTICOAGULATION  CLINIC 35 tablet 3   No current facility-administered  medications for this visit.     Past Medical History  Diagnosis Date  . Paroxysmal atrial fibrillation   . HTN (hypertension)   . Hyperlipidemia   . Migraines   . Arthritis of knee, right     end-stage  . Bursitis   . Gout   . Tinnitus   . Depression   . Bronchitis   . Heart murmur   . Anemia     since post op- knee replacement   . Complication of anesthesia     irritation in throat postop, makes a-fib worse   . DJD (degenerative joint disease)     OA- "everywhere"  . Anxiety     takes xanax 2 times per day, taken mostly for ringing in ears    . PONV (postoperative nausea and vomiting)   . Family history of anesthesia complication     sister with nausia and vomiting    ROS:   All systems reviewed and negative except as noted in the HPI.   Past Surgical History  Procedure Laterality Date  . Transesophageal echocardiogram  04/2008  . Transthoracic echocardiogram  2006,2009  . Bilteral thumb joint surgrery    . Right rotator cuff surgery  2006  . Cholecystectomy    . Rhinoplasty    . Uvulectomy    . Appendectomy    . Pacemaker insertion  06/06/2005  Medtronic EnRhythm dual-chamber. For sick sinus syndrome  . Right total hip arthroplasty    . Left rotator cuff repair    . Cardiac catheterization  2005     which revealed 40% stenosis of the mid left anterior descending artery., ablation- 2009  . Tonsillectomy      as a child  . Joint replacement      L knee- 12/11,R hip- 1996  . Total knee revision  08/08/2011    Procedure: TOTAL KNEE REVISION;  Surgeon: Kerin Salen, MD;  Location: [place];  Service: Orthopedics;  Laterality: Left;  DEPUY/SIGMA  . Atrial fibrillation ablation  2009, 12/02/11    PVI by Dr Rayann Heman x 2  . Tee without cardioversion  12/01/2011    Procedure: TRANSESOPHAGEAL ECHOCARDIOGRAM (TEE);  Surgeon: Fay Records, MD;  Location: [facility] ENDOSCOPY;  Service: Cardiovascular;  Laterality: N/A;  a-fib ablation following day     Family History  Problem  Relation Age of Onset  . Coronary artery disease Mother   . Other Mother     Cerebrovascular Disease  . Cancer    . Anesthesia problems Neg Hx   . Hypotension Neg Hx   . Malignant hyperthermia Neg Hx   . Pseudochol deficiency Neg Hx      History   Social History  . Marital Status: Married    Spouse Name: N/A    Number of Children: 2  . Years of Education: N/A   Occupational History  . retired    Social History Main Topics  . Smoking status: Never Smoker   . Smokeless tobacco: Never Used  . Alcohol Use: Yes     Comment: rare use  . Drug Use: No  . Sexual Activity: Not on file   Other Topics Concern  . Not on file   Social History Narrative  . No narrative on file     There were no vitals taken for this visit.  Physical Exam:  Well appearing 73 year old man, NAD HEENT: Unremarkable Neck:  6 cm JVD, no thyromegally Lungs:  Clear with no wheezes HEART:  Regular rate rhythm, no murmurs, no rubs, no clicks Abd:  soft, positive bowel sounds, no organomegally, no rebound, no guarding Ext:  2 plus pulses, no edema, no cyanosis, no clubbing Skin:  No rashes no nodules Neuro:  CN II through XII intact, motor grossly intact  DEVICE  Normal device function.  See PaceArt for details.   Assess/Plan:

## 2014-04-24 NOTE — Assessment & Plan Note (Signed)
He is maintaining sinus rhythm 99.9% of the time. He will continue his current medical therapy.

## 2014-04-24 NOTE — Patient Instructions (Signed)
Remote monitoring is used to monitor your pacemaker from home. This monitoring reduces the number of office visits required to check your device to one time per year. It allows [place] to keep an eye on the functioning of your device to ensure it is working properly. You are scheduled for a device check from home on 07-26-2014. You may send your transmission at any time that day. If you have a wireless device, the transmission will be sent automatically. After your physician reviews your transmission, you will receive a postcard with your next transmission date.  Your physician recommends that you schedule a follow-up appointment in: 12 months with Dr. Lovena Le

## 2014-04-24 NOTE — Assessment & Plan Note (Signed)
His blood pressure is well controlled today. He will continue his current medications, and his regular weekly exercise.

## 2014-04-24 NOTE — Assessment & Plan Note (Signed)
His Medtronic dual-chamber pacemaker is working normally. He has approximately 11 years of battery longevity.

## 2014-04-24 NOTE — Assessment & Plan Note (Signed)
I discussed the episode of his depression and altered mentation which occurred several months ago. He is still quite anxious about this event. However he is feeling better with Xanax. He is also on Prozac which appears to be helping. I encouraged the patient to keep exercising and follow-up with his appointments.

## 2014-04-25 ENCOUNTER — Encounter: Payer: Self-pay | Admitting: Internal Medicine

## 2014-05-01 ENCOUNTER — Ambulatory Visit (INDEPENDENT_AMBULATORY_CARE_PROVIDER_SITE_OTHER): Payer: Medicare Other

## 2014-05-01 DIAGNOSIS — Z7901 Long term (current) use of anticoagulants: Secondary | ICD-10-CM

## 2014-05-01 DIAGNOSIS — I4891 Unspecified atrial fibrillation: Secondary | ICD-10-CM | POA: Diagnosis not present

## 2014-05-01 LAB — POCT INR: INR: 2.9

## 2014-05-04 ENCOUNTER — Encounter (HOSPITAL_COMMUNITY): Payer: Self-pay | Admitting: Internal Medicine

## 2014-05-04 DIAGNOSIS — F332 Major depressive disorder, recurrent severe without psychotic features: Secondary | ICD-10-CM | POA: Diagnosis not present

## 2014-05-22 ENCOUNTER — Other Ambulatory Visit: Payer: Self-pay | Admitting: Internal Medicine

## 2014-06-12 ENCOUNTER — Ambulatory Visit (INDEPENDENT_AMBULATORY_CARE_PROVIDER_SITE_OTHER): Payer: Medicare Other

## 2014-06-12 DIAGNOSIS — I4891 Unspecified atrial fibrillation: Secondary | ICD-10-CM | POA: Diagnosis not present

## 2014-06-12 DIAGNOSIS — Z7901 Long term (current) use of anticoagulants: Secondary | ICD-10-CM | POA: Diagnosis not present

## 2014-06-12 LAB — POCT INR: INR: 1.9

## 2014-06-20 ENCOUNTER — Other Ambulatory Visit: Payer: Self-pay | Admitting: Internal Medicine

## 2014-06-22 DIAGNOSIS — Z96659 Presence of unspecified artificial knee joint: Secondary | ICD-10-CM | POA: Diagnosis not present

## 2014-07-05 ENCOUNTER — Other Ambulatory Visit: Payer: Self-pay | Admitting: Internal Medicine

## 2014-07-17 ENCOUNTER — Ambulatory Visit (INDEPENDENT_AMBULATORY_CARE_PROVIDER_SITE_OTHER): Payer: Medicare Other | Admitting: *Deleted

## 2014-07-17 DIAGNOSIS — I4891 Unspecified atrial fibrillation: Secondary | ICD-10-CM

## 2014-07-17 DIAGNOSIS — Z7901 Long term (current) use of anticoagulants: Secondary | ICD-10-CM

## 2014-07-17 LAB — POCT INR: INR: 2.4

## 2014-07-21 ENCOUNTER — Other Ambulatory Visit: Payer: Self-pay | Admitting: Internal Medicine

## 2014-07-25 DIAGNOSIS — H2513 Age-related nuclear cataract, bilateral: Secondary | ICD-10-CM | POA: Diagnosis not present

## 2014-07-26 ENCOUNTER — Ambulatory Visit (INDEPENDENT_AMBULATORY_CARE_PROVIDER_SITE_OTHER): Payer: Medicare Other | Admitting: *Deleted

## 2014-07-26 DIAGNOSIS — I495 Sick sinus syndrome: Secondary | ICD-10-CM | POA: Diagnosis not present

## 2014-07-26 LAB — MDC_IDC_ENUM_SESS_TYPE_REMOTE
Battery Impedance: 157 Ohm
Battery Voltage: 2.8 V
Brady Statistic AS VP Percent: 0 %
Brady Statistic AS VS Percent: 1 %
Date Time Interrogation Session: 20160302153419
Lead Channel Impedance Value: 386 Ohm
Lead Channel Pacing Threshold Amplitude: 0.375 V
Lead Channel Pacing Threshold Pulse Width: 0.4 ms
Lead Channel Pacing Threshold Pulse Width: 0.4 ms
Lead Channel Setting Pacing Amplitude: 2 V
Lead Channel Setting Pacing Amplitude: 2.5 V
MDC IDC MSMT BATTERY REMAINING LONGEVITY: 136 mo
MDC IDC MSMT LEADCHNL RV IMPEDANCE VALUE: 578 Ohm
MDC IDC MSMT LEADCHNL RV PACING THRESHOLD AMPLITUDE: 1 V
MDC IDC MSMT LEADCHNL RV SENSING INTR AMPL: 16 mV
MDC IDC SET LEADCHNL RV PACING PULSEWIDTH: 0.34 ms
MDC IDC SET LEADCHNL RV SENSING SENSITIVITY: 4 mV
MDC IDC STAT BRADY AP VP PERCENT: 0 %
MDC IDC STAT BRADY AP VS PERCENT: 99 %

## 2014-07-26 NOTE — Progress Notes (Signed)
Remote pacemaker transmission.   

## 2014-08-02 DIAGNOSIS — F332 Major depressive disorder, recurrent severe without psychotic features: Secondary | ICD-10-CM | POA: Diagnosis not present

## 2014-08-03 DIAGNOSIS — Z08 Encounter for follow-up examination after completed treatment for malignant neoplasm: Secondary | ICD-10-CM | POA: Diagnosis not present

## 2014-08-03 DIAGNOSIS — X32XXXD Exposure to sunlight, subsequent encounter: Secondary | ICD-10-CM | POA: Diagnosis not present

## 2014-08-03 DIAGNOSIS — L570 Actinic keratosis: Secondary | ICD-10-CM | POA: Diagnosis not present

## 2014-08-03 DIAGNOSIS — Z85828 Personal history of other malignant neoplasm of skin: Secondary | ICD-10-CM | POA: Diagnosis not present

## 2014-08-10 ENCOUNTER — Encounter: Payer: Self-pay | Admitting: Cardiology

## 2014-08-16 ENCOUNTER — Encounter: Payer: Self-pay | Admitting: Internal Medicine

## 2014-08-30 ENCOUNTER — Ambulatory Visit (INDEPENDENT_AMBULATORY_CARE_PROVIDER_SITE_OTHER): Payer: Medicare Other | Admitting: Surgery

## 2014-08-30 DIAGNOSIS — Z7901 Long term (current) use of anticoagulants: Secondary | ICD-10-CM | POA: Diagnosis not present

## 2014-08-30 DIAGNOSIS — I4891 Unspecified atrial fibrillation: Secondary | ICD-10-CM

## 2014-08-30 LAB — POCT INR: INR: 2.9

## 2014-09-27 ENCOUNTER — Other Ambulatory Visit: Payer: Self-pay | Admitting: Internal Medicine

## 2014-10-11 ENCOUNTER — Ambulatory Visit (INDEPENDENT_AMBULATORY_CARE_PROVIDER_SITE_OTHER): Payer: Medicare Other | Admitting: *Deleted

## 2014-10-11 DIAGNOSIS — Z7901 Long term (current) use of anticoagulants: Secondary | ICD-10-CM

## 2014-10-11 DIAGNOSIS — I4891 Unspecified atrial fibrillation: Secondary | ICD-10-CM | POA: Diagnosis not present

## 2014-10-11 LAB — POCT INR: INR: 4.8

## 2014-10-30 ENCOUNTER — Ambulatory Visit (INDEPENDENT_AMBULATORY_CARE_PROVIDER_SITE_OTHER): Payer: Medicare Other | Admitting: *Deleted

## 2014-10-30 DIAGNOSIS — I495 Sick sinus syndrome: Secondary | ICD-10-CM | POA: Diagnosis not present

## 2014-10-31 NOTE — Progress Notes (Signed)
Remote pacemaker transmission.   

## 2014-11-01 ENCOUNTER — Ambulatory Visit (INDEPENDENT_AMBULATORY_CARE_PROVIDER_SITE_OTHER): Payer: Medicare Other | Admitting: *Deleted

## 2014-11-01 DIAGNOSIS — I4891 Unspecified atrial fibrillation: Secondary | ICD-10-CM | POA: Diagnosis not present

## 2014-11-01 DIAGNOSIS — Z7901 Long term (current) use of anticoagulants: Secondary | ICD-10-CM | POA: Diagnosis not present

## 2014-11-01 LAB — POCT INR: INR: 2.9

## 2014-11-07 LAB — CUP PACEART REMOTE DEVICE CHECK
Battery Impedance: 157 Ohm
Battery Remaining Longevity: 138 mo
Brady Statistic AP VS Percent: 99 %
Brady Statistic AS VS Percent: 1 %
Date Time Interrogation Session: 20160606151429
Lead Channel Impedance Value: 589 Ohm
Lead Channel Pacing Threshold Amplitude: 0.375 V
Lead Channel Sensing Intrinsic Amplitude: 8 mV
MDC IDC MSMT BATTERY VOLTAGE: 2.8 V
MDC IDC MSMT LEADCHNL RA IMPEDANCE VALUE: 412 Ohm
MDC IDC MSMT LEADCHNL RA PACING THRESHOLD PULSEWIDTH: 0.4 ms
MDC IDC MSMT LEADCHNL RV PACING THRESHOLD AMPLITUDE: 1 V
MDC IDC MSMT LEADCHNL RV PACING THRESHOLD PULSEWIDTH: 0.4 ms
MDC IDC SET LEADCHNL RA PACING AMPLITUDE: 2 V
MDC IDC SET LEADCHNL RV PACING AMPLITUDE: 2.5 V
MDC IDC SET LEADCHNL RV PACING PULSEWIDTH: 0.34 ms
MDC IDC SET LEADCHNL RV SENSING SENSITIVITY: 2.8 mV
MDC IDC STAT BRADY AP VP PERCENT: 0 %
MDC IDC STAT BRADY AS VP PERCENT: 0 %

## 2014-11-13 ENCOUNTER — Encounter: Payer: Self-pay | Admitting: Cardiology

## 2014-11-15 DIAGNOSIS — N401 Enlarged prostate with lower urinary tract symptoms: Secondary | ICD-10-CM | POA: Diagnosis not present

## 2014-11-16 DIAGNOSIS — J309 Allergic rhinitis, unspecified: Secondary | ICD-10-CM | POA: Diagnosis not present

## 2014-11-16 DIAGNOSIS — R062 Wheezing: Secondary | ICD-10-CM | POA: Diagnosis not present

## 2014-11-17 ENCOUNTER — Encounter: Payer: Self-pay | Admitting: Internal Medicine

## 2014-11-22 DIAGNOSIS — N138 Other obstructive and reflux uropathy: Secondary | ICD-10-CM | POA: Diagnosis not present

## 2014-11-22 DIAGNOSIS — N401 Enlarged prostate with lower urinary tract symptoms: Secondary | ICD-10-CM | POA: Diagnosis not present

## 2014-12-13 ENCOUNTER — Ambulatory Visit (INDEPENDENT_AMBULATORY_CARE_PROVIDER_SITE_OTHER): Payer: Medicare Other | Admitting: *Deleted

## 2014-12-13 DIAGNOSIS — I4891 Unspecified atrial fibrillation: Secondary | ICD-10-CM

## 2014-12-13 DIAGNOSIS — Z7901 Long term (current) use of anticoagulants: Secondary | ICD-10-CM | POA: Diagnosis not present

## 2014-12-13 LAB — POCT INR: INR: 3.5

## 2015-01-03 DIAGNOSIS — R062 Wheezing: Secondary | ICD-10-CM | POA: Diagnosis not present

## 2015-01-03 DIAGNOSIS — J019 Acute sinusitis, unspecified: Secondary | ICD-10-CM | POA: Diagnosis not present

## 2015-01-03 DIAGNOSIS — J309 Allergic rhinitis, unspecified: Secondary | ICD-10-CM | POA: Diagnosis not present

## 2015-01-30 ENCOUNTER — Ambulatory Visit (INDEPENDENT_AMBULATORY_CARE_PROVIDER_SITE_OTHER): Payer: Medicare Other | Admitting: *Deleted

## 2015-01-30 ENCOUNTER — Encounter: Payer: Self-pay | Admitting: Internal Medicine

## 2015-01-30 DIAGNOSIS — I495 Sick sinus syndrome: Secondary | ICD-10-CM | POA: Diagnosis not present

## 2015-01-31 DIAGNOSIS — R51 Headache: Secondary | ICD-10-CM | POA: Diagnosis not present

## 2015-01-31 DIAGNOSIS — J309 Allergic rhinitis, unspecified: Secondary | ICD-10-CM | POA: Diagnosis not present

## 2015-01-31 NOTE — Progress Notes (Signed)
Remote pacemaker transmission.   

## 2015-02-02 DIAGNOSIS — X32XXXD Exposure to sunlight, subsequent encounter: Secondary | ICD-10-CM | POA: Diagnosis not present

## 2015-02-02 DIAGNOSIS — Z1283 Encounter for screening for malignant neoplasm of skin: Secondary | ICD-10-CM | POA: Diagnosis not present

## 2015-02-02 DIAGNOSIS — L570 Actinic keratosis: Secondary | ICD-10-CM | POA: Diagnosis not present

## 2015-02-02 DIAGNOSIS — Z08 Encounter for follow-up examination after completed treatment for malignant neoplasm: Secondary | ICD-10-CM | POA: Diagnosis not present

## 2015-02-02 DIAGNOSIS — Z85828 Personal history of other malignant neoplasm of skin: Secondary | ICD-10-CM | POA: Diagnosis not present

## 2015-02-16 LAB — CUP PACEART REMOTE DEVICE CHECK
Battery Impedance: 180 Ohm
Battery Remaining Longevity: 126 mo
Battery Voltage: 2.79 V
Brady Statistic AP VP Percent: 0 %
Brady Statistic AP VS Percent: 99 %
Date Time Interrogation Session: 20160906144026
Lead Channel Impedance Value: 479 Ohm
Lead Channel Pacing Threshold Amplitude: 0.375 V
Lead Channel Pacing Threshold Amplitude: 1 V
Lead Channel Pacing Threshold Pulse Width: 0.4 ms
Lead Channel Setting Pacing Amplitude: 2 V
Lead Channel Setting Pacing Amplitude: 2.5 V
Lead Channel Setting Pacing Pulse Width: 0.34 ms
Lead Channel Setting Sensing Sensitivity: 4 mV
MDC IDC MSMT LEADCHNL RA PACING THRESHOLD PULSEWIDTH: 0.4 ms
MDC IDC MSMT LEADCHNL RV IMPEDANCE VALUE: 591 Ohm
MDC IDC MSMT LEADCHNL RV SENSING INTR AMPL: 8 mV
MDC IDC STAT BRADY AS VP PERCENT: 0 %
MDC IDC STAT BRADY AS VS PERCENT: 1 %

## 2015-02-28 DIAGNOSIS — F332 Major depressive disorder, recurrent severe without psychotic features: Secondary | ICD-10-CM | POA: Diagnosis not present

## 2015-03-01 DIAGNOSIS — Z23 Encounter for immunization: Secondary | ICD-10-CM | POA: Diagnosis not present

## 2015-03-01 DIAGNOSIS — E7800 Pure hypercholesterolemia, unspecified: Secondary | ICD-10-CM | POA: Diagnosis not present

## 2015-03-01 DIAGNOSIS — I4891 Unspecified atrial fibrillation: Secondary | ICD-10-CM | POA: Diagnosis not present

## 2015-03-01 DIAGNOSIS — K219 Gastro-esophageal reflux disease without esophagitis: Secondary | ICD-10-CM | POA: Diagnosis not present

## 2015-03-01 DIAGNOSIS — Z950 Presence of cardiac pacemaker: Secondary | ICD-10-CM | POA: Diagnosis not present

## 2015-03-01 DIAGNOSIS — J309 Allergic rhinitis, unspecified: Secondary | ICD-10-CM | POA: Diagnosis not present

## 2015-03-01 DIAGNOSIS — Z1389 Encounter for screening for other disorder: Secondary | ICD-10-CM | POA: Diagnosis not present

## 2015-03-01 DIAGNOSIS — I10 Essential (primary) hypertension: Secondary | ICD-10-CM | POA: Diagnosis not present

## 2015-03-01 DIAGNOSIS — H9319 Tinnitus, unspecified ear: Secondary | ICD-10-CM | POA: Diagnosis not present

## 2015-03-01 DIAGNOSIS — M1990 Unspecified osteoarthritis, unspecified site: Secondary | ICD-10-CM | POA: Diagnosis not present

## 2015-03-01 DIAGNOSIS — G43909 Migraine, unspecified, not intractable, without status migrainosus: Secondary | ICD-10-CM | POA: Diagnosis not present

## 2015-03-02 ENCOUNTER — Encounter: Payer: Self-pay | Admitting: Cardiology

## 2015-03-27 ENCOUNTER — Ambulatory Visit (INDEPENDENT_AMBULATORY_CARE_PROVIDER_SITE_OTHER): Payer: Medicare Other | Admitting: *Deleted

## 2015-03-27 DIAGNOSIS — Z7901 Long term (current) use of anticoagulants: Secondary | ICD-10-CM

## 2015-03-27 DIAGNOSIS — I4891 Unspecified atrial fibrillation: Secondary | ICD-10-CM | POA: Diagnosis not present

## 2015-03-27 LAB — POCT INR: INR: 3.3

## 2015-04-06 ENCOUNTER — Other Ambulatory Visit: Payer: Self-pay

## 2015-04-06 MED ORDER — PROPRANOLOL HCL 20 MG PO TABS: 20.0000 mg | ORAL_TABLET | Freq: Three times a day (TID) | ORAL | Status: DC

## 2015-04-16 ENCOUNTER — Ambulatory Visit (INDEPENDENT_AMBULATORY_CARE_PROVIDER_SITE_OTHER): Payer: Medicare Other | Admitting: *Deleted

## 2015-04-16 DIAGNOSIS — Z7901 Long term (current) use of anticoagulants: Secondary | ICD-10-CM

## 2015-04-16 DIAGNOSIS — I4891 Unspecified atrial fibrillation: Secondary | ICD-10-CM

## 2015-04-16 LAB — POCT INR: INR: 3.1

## 2015-04-17 ENCOUNTER — Other Ambulatory Visit: Payer: Self-pay | Admitting: Internal Medicine

## 2015-05-02 ENCOUNTER — Encounter: Payer: Self-pay | Admitting: Internal Medicine

## 2015-05-02 ENCOUNTER — Ambulatory Visit (INDEPENDENT_AMBULATORY_CARE_PROVIDER_SITE_OTHER): Payer: Medicare Other | Admitting: *Deleted

## 2015-05-02 ENCOUNTER — Ambulatory Visit (INDEPENDENT_AMBULATORY_CARE_PROVIDER_SITE_OTHER): Payer: Medicare Other | Admitting: Internal Medicine

## 2015-05-02 VITALS — BP 142/90 | HR 70 | Ht 67.0 in | Wt 191.4 lb

## 2015-05-02 DIAGNOSIS — Z950 Presence of cardiac pacemaker: Secondary | ICD-10-CM | POA: Diagnosis not present

## 2015-05-02 DIAGNOSIS — Z7901 Long term (current) use of anticoagulants: Secondary | ICD-10-CM

## 2015-05-02 DIAGNOSIS — I480 Paroxysmal atrial fibrillation: Secondary | ICD-10-CM | POA: Diagnosis not present

## 2015-05-02 DIAGNOSIS — I10 Essential (primary) hypertension: Secondary | ICD-10-CM | POA: Diagnosis not present

## 2015-05-02 DIAGNOSIS — I4891 Unspecified atrial fibrillation: Secondary | ICD-10-CM

## 2015-05-02 LAB — CUP PACEART INCLINIC DEVICE CHECK
Battery Remaining Longevity: 136 mo
Battery Voltage: 2.8 V
Brady Statistic AP VP Percent: 0 %
Brady Statistic AS VP Percent: 0 %
Brady Statistic AS VS Percent: 1 %
Implantable Lead Implant Date: 20070112
Implantable Lead Implant Date: 20070112
Implantable Lead Location: 753860
Implantable Lead Model: 5076
Implantable Lead Model: 5076
Lead Channel Impedance Value: 472 Ohm
Lead Channel Impedance Value: 596 Ohm
Lead Channel Pacing Threshold Amplitude: 0.5 V
Lead Channel Pacing Threshold Amplitude: 1 V
Lead Channel Pacing Threshold Pulse Width: 0.34 ms
Lead Channel Sensing Intrinsic Amplitude: 5.6 mV
Lead Channel Setting Pacing Amplitude: 2 V
Lead Channel Setting Sensing Sensitivity: 2.8 mV
MDC IDC LEAD LOCATION: 753859
MDC IDC MSMT BATTERY IMPEDANCE: 180 Ohm
MDC IDC MSMT LEADCHNL RV PACING THRESHOLD PULSEWIDTH: 0.34 ms
MDC IDC MSMT LEADCHNL RV SENSING INTR AMPL: 8 mV
MDC IDC SESS DTM: 20161207143056
MDC IDC SET LEADCHNL RA PACING AMPLITUDE: 1.5 V
MDC IDC SET LEADCHNL RV PACING PULSEWIDTH: 0.34 ms
MDC IDC STAT BRADY AP VS PERCENT: 99 %

## 2015-05-02 LAB — POCT INR: INR: 1.5

## 2015-05-02 NOTE — Assessment & Plan Note (Signed)
He is maintaining NSR 98% of the time. He will continue his current meds. 

## 2015-05-02 NOTE — Progress Notes (Signed)
HPI [name] [name] returns today for followup. He is a very pleasant 74 year old man with paroxysmal atrial fibrillation, status post ablation, hypertension, symptomatic bradycardia, status post permanent pacemaker insertion. He denies palpitations, chest pain, or shortness of breath. No peripheral edema. He has rare palpitations. Allergies  Allergen Reactions  . Iohexol      Code: HIVES, Desc: POST MYELOGRAM.  PT ALSO SAID THIS HAPPENED A YEAR AGO WITH A MYELOGRAM AND WITH ESI'S., Onset Date: [id number]   . Meperidine Hcl     REACTION: unspecified  . Prednisone     Redness of skin & jittery     Current Outpatient Prescriptions  Medication Sig Dispense Refill  . ALPRAZolam (XANAX) 0.5 MG tablet Take 1 tablet (0.5 mg total) by mouth 3 (three) times daily as needed for anxiety. 90 tablet 0  . ferrous sulfate 325 (65 FE) MG tablet Take 1 tablet (325 mg total) by mouth daily with breakfast. For low iron.  3  . FLUoxetine (PROZAC) 40 MG capsule Take one tablet each day for depression. (40mg  ) (Patient taking differently: Take one tablet by mouth each day for depression. (40mg  )) 30 capsule 0  . losartan (COZAAR) 50 MG tablet TAKE ONE TABLET BY MOUTH ONCE DAILY 30 tablet 10  . meloxicam (MOBIC) 15 MG tablet Take 15 mg by mouth daily.    . potassium chloride SA (K-DUR,KLOR-CON) 20 MEQ tablet Take one tablet two times a day for low potassium. (Patient taking differently: Take one tablet by mouth two times a day for low potassium.)    . propranolol (INDERAL) 20 MG tablet Take 1 tablet (20 mg total) by mouth 3 (three) times daily. 90 tablet 0  . tamsulosin (FLOMAX) 0.4 MG CAPS capsule Take 0.4 mg by mouth daily.     Marland Kitchen warfarin (COUMADIN) 5 MG tablet TAKE AS DIRECTED BY ANTICOAGULATION CLINIC 35 tablet 0   No current facility-administered medications for this visit.     Past Medical History  Diagnosis Date  . Paroxysmal atrial fibrillation (HCC)   . HTN (hypertension)   . Hyperlipidemia   .  Migraines   . Arthritis of knee, right     end-stage  . Bursitis   . Gout   . Tinnitus   . Depression   . Bronchitis   . Heart murmur   . Anemia     since post op- knee replacement   . Complication of anesthesia     irritation in throat postop, makes a-fib worse   . DJD (degenerative joint disease)     OA- "everywhere"  . Anxiety     takes xanax 2 times per day, taken mostly for ringing in ears    . PONV (postoperative nausea and vomiting)   . Family history of anesthesia complication     sister with nausia and vomiting    ROS:   All systems reviewed and negative except as noted in the HPI.   Past Surgical History  Procedure Laterality Date  . Transesophageal echocardiogram  04/2008  . Transthoracic echocardiogram  2006,2009  . Bilteral thumb joint surgrery    . Right rotator cuff surgery  2006  . Cholecystectomy    . Rhinoplasty    . Uvulectomy    . Appendectomy    . Pacemaker insertion  06/06/2005    Medtronic EnRhythm dual-chamber. For sick sinus syndrome  . Right total hip arthroplasty    . Left rotator cuff repair    . Cardiac catheterization  2005  which revealed 40% stenosis of the mid left anterior descending artery., ablation- 2009  . Tonsillectomy      as a child  . Joint replacement      L knee- 12/11,R hip- 1996  . Total knee revision  08/08/2011    Procedure: TOTAL KNEE REVISION;  Surgeon: Kerin Salen, MD;  Location: [facility];  Service: Orthopedics;  Laterality: Left;  DEPUY/SIGMA  . Atrial fibrillation ablation  2009, 12/02/11    PVI by Dr Rayann Heman x 2  . Tee without cardioversion  12/01/2011    Procedure: TRANSESOPHAGEAL ECHOCARDIOGRAM (TEE);  Surgeon: Fay Records, MD;  Location: [facility] ENDOSCOPY;  Service: Cardiovascular;  Laterality: N/A;  a-fib ablation following day  . Atrial fibrillation ablation N/A 12/02/2011    Procedure: ATRIAL FIBRILLATION ABLATION;  Surgeon: Thompson Grayer, MD;  Location: [facility] CATH LAB;  Service: Cardiovascular;  Laterality: N/A;  .  Permanent pacemaker generator change N/A 03/12/2012    Procedure: PERMANENT PACEMAKER GENERATOR CHANGE;  Surgeon: Evans Lance, MD;  Location: [facility] CATH LAB;  Service: Cardiovascular;  Laterality: N/A;     Family History  Problem Relation Age of Onset  . Coronary artery disease Mother   . Other Mother     Cerebrovascular Disease  . Cancer    . Anesthesia problems Neg Hx   . Hypotension Neg Hx   . Malignant hyperthermia Neg Hx   . Pseudochol deficiency Neg Hx      Social History   Social History  . Marital Status: Married    Spouse Name: N/A  . Number of Children: 2  . Years of Education: N/A   Occupational History  . retired    Social History Main Topics  . Smoking status: Never Smoker   . Smokeless tobacco: Never Used  . Alcohol Use: Yes     Comment: rare use  . Drug Use: No  . Sexual Activity: Not on file   Other Topics Concern  . Not on file   Social History Narrative     BP 142/90 mmHg  Pulse 70  Ht 5\' 7"  (1.702 m)  Wt 191 lb 6.4 oz (86.818 kg)  BMI 29.97 kg/m2  Physical Exam:  Well appearing 74 year old man, NAD HEENT: Unremarkable Neck:  6 cm JVD, no thyromegally Lungs:  Clear with no wheezes HEART:  Regular rate rhythm, no murmurs, no rubs, no clicks Abd:  soft, positive bowel sounds, no organomegally, no rebound, no guarding Ext:  2 plus pulses, no edema, no cyanosis, no clubbing Skin:  No rashes no nodules Neuro:  CN II through XII intact, motor grossly intact  DEVICE  Normal device function.  See PaceArt for details.   Assess/Plan:

## 2015-05-02 NOTE — Assessment & Plan Note (Signed)
His Medtronic DDD PM is working normally. Will recheck in several months. 

## 2015-05-02 NOTE — Patient Instructions (Addendum)
Medication Instructions:  Your physician recommends that you continue on your current medications as directed. Please refer to the Current Medication list given to you today.  Labwork: None ordered  Testing/Procedures: None ordered  Follow-Up: Remote monitoring is used to monitor your Pacemaker of ICD from home. This monitoring reduces the number of office visits required to check your device to one time per year. It allows us to keep an eye on the functioning of your device to ensure it is working properly. You are scheduled for a device check from home on 08/01/15. You may send your transmission at any time that day. If you have a wireless device, the transmission will be sent automatically. After your physician reviews your transmission, you will receive a postcard with your next transmission date.  Your physician wants you to follow-up in: 1 year with Dr. Taylor.  You will receive a reminder letter in the mail two months in advance. If you don't receive a letter, please call our office to schedule the follow-up appointment.  If you need a refill on your cardiac medications before your next appointment, please call your pharmacy.  Thank you for choosing [facility]!!         

## 2015-05-02 NOTE — Assessment & Plan Note (Signed)
His blood pressure has been reasonably well controlled. I have asked him to maintain a low sodium diet.

## 2015-05-07 ENCOUNTER — Other Ambulatory Visit: Payer: Self-pay | Admitting: Internal Medicine

## 2015-05-11 ENCOUNTER — Ambulatory Visit (INDEPENDENT_AMBULATORY_CARE_PROVIDER_SITE_OTHER): Payer: Medicare Other | Admitting: Pharmacist

## 2015-05-11 DIAGNOSIS — I4891 Unspecified atrial fibrillation: Secondary | ICD-10-CM | POA: Diagnosis not present

## 2015-05-11 DIAGNOSIS — Z7901 Long term (current) use of anticoagulants: Secondary | ICD-10-CM

## 2015-05-11 LAB — POCT INR: INR: 2.6

## 2015-05-25 ENCOUNTER — Ambulatory Visit (INDEPENDENT_AMBULATORY_CARE_PROVIDER_SITE_OTHER): Payer: Medicare Other | Admitting: *Deleted

## 2015-05-25 DIAGNOSIS — I480 Paroxysmal atrial fibrillation: Secondary | ICD-10-CM

## 2015-05-25 DIAGNOSIS — Z7901 Long term (current) use of anticoagulants: Secondary | ICD-10-CM

## 2015-05-25 DIAGNOSIS — I4891 Unspecified atrial fibrillation: Secondary | ICD-10-CM | POA: Diagnosis not present

## 2015-05-25 LAB — POCT INR: INR: 2.7

## 2015-06-15 ENCOUNTER — Ambulatory Visit (INDEPENDENT_AMBULATORY_CARE_PROVIDER_SITE_OTHER): Payer: Medicare Other | Admitting: *Deleted

## 2015-06-15 DIAGNOSIS — I4891 Unspecified atrial fibrillation: Secondary | ICD-10-CM | POA: Diagnosis not present

## 2015-06-15 DIAGNOSIS — Z7901 Long term (current) use of anticoagulants: Secondary | ICD-10-CM | POA: Diagnosis not present

## 2015-06-15 LAB — POCT INR: INR: 1.9

## 2015-06-18 ENCOUNTER — Other Ambulatory Visit: Payer: Self-pay | Admitting: Internal Medicine

## 2015-06-28 ENCOUNTER — Other Ambulatory Visit: Payer: Self-pay | Admitting: Internal Medicine

## 2015-07-13 ENCOUNTER — Ambulatory Visit (INDEPENDENT_AMBULATORY_CARE_PROVIDER_SITE_OTHER): Payer: Medicare Other | Admitting: *Deleted

## 2015-07-13 DIAGNOSIS — Z7901 Long term (current) use of anticoagulants: Secondary | ICD-10-CM | POA: Diagnosis not present

## 2015-07-13 DIAGNOSIS — I4891 Unspecified atrial fibrillation: Secondary | ICD-10-CM

## 2015-07-13 LAB — POCT INR: INR: 2

## 2015-07-27 ENCOUNTER — Ambulatory Visit (INDEPENDENT_AMBULATORY_CARE_PROVIDER_SITE_OTHER): Payer: Medicare Other | Admitting: *Deleted

## 2015-07-27 DIAGNOSIS — I4891 Unspecified atrial fibrillation: Secondary | ICD-10-CM

## 2015-07-27 DIAGNOSIS — Z7901 Long term (current) use of anticoagulants: Secondary | ICD-10-CM | POA: Diagnosis not present

## 2015-07-27 LAB — POCT INR: INR: 1.7

## 2015-08-01 ENCOUNTER — Ambulatory Visit: Payer: Medicare Other | Admitting: *Deleted

## 2015-08-02 DIAGNOSIS — Z85828 Personal history of other malignant neoplasm of skin: Secondary | ICD-10-CM | POA: Diagnosis not present

## 2015-08-02 DIAGNOSIS — Z08 Encounter for follow-up examination after completed treatment for malignant neoplasm: Secondary | ICD-10-CM | POA: Diagnosis not present

## 2015-08-02 DIAGNOSIS — Z1283 Encounter for screening for malignant neoplasm of skin: Secondary | ICD-10-CM | POA: Diagnosis not present

## 2015-08-02 DIAGNOSIS — L570 Actinic keratosis: Secondary | ICD-10-CM | POA: Diagnosis not present

## 2015-08-02 DIAGNOSIS — X32XXXD Exposure to sunlight, subsequent encounter: Secondary | ICD-10-CM | POA: Diagnosis not present

## 2015-08-03 ENCOUNTER — Encounter: Payer: Self-pay | Admitting: Cardiology

## 2015-08-10 ENCOUNTER — Ambulatory Visit (INDEPENDENT_AMBULATORY_CARE_PROVIDER_SITE_OTHER): Payer: Medicare Other | Admitting: *Deleted

## 2015-08-10 DIAGNOSIS — I4891 Unspecified atrial fibrillation: Secondary | ICD-10-CM

## 2015-08-10 DIAGNOSIS — Z7901 Long term (current) use of anticoagulants: Secondary | ICD-10-CM | POA: Diagnosis not present

## 2015-08-10 LAB — POCT INR: INR: 3.1

## 2015-08-22 ENCOUNTER — Ambulatory Visit (INDEPENDENT_AMBULATORY_CARE_PROVIDER_SITE_OTHER): Payer: Medicare Other | Admitting: *Deleted

## 2015-08-22 DIAGNOSIS — Z950 Presence of cardiac pacemaker: Secondary | ICD-10-CM | POA: Diagnosis not present

## 2015-08-22 DIAGNOSIS — I495 Sick sinus syndrome: Secondary | ICD-10-CM

## 2015-08-22 NOTE — Progress Notes (Signed)
Remote pacemaker transmission.   

## 2015-08-24 ENCOUNTER — Ambulatory Visit (INDEPENDENT_AMBULATORY_CARE_PROVIDER_SITE_OTHER): Payer: Medicare Other | Admitting: Pharmacist

## 2015-08-24 DIAGNOSIS — Z7901 Long term (current) use of anticoagulants: Secondary | ICD-10-CM

## 2015-08-24 DIAGNOSIS — I4891 Unspecified atrial fibrillation: Secondary | ICD-10-CM

## 2015-08-24 LAB — POCT INR: INR: 1.4

## 2015-08-29 DIAGNOSIS — F332 Major depressive disorder, recurrent severe without psychotic features: Secondary | ICD-10-CM | POA: Diagnosis not present

## 2015-09-06 LAB — CUP PACEART REMOTE DEVICE CHECK
Battery Remaining Longevity: 137 mo
Battery Voltage: 2.8 V
Brady Statistic AP VS Percent: 99 %
Brady Statistic AS VP Percent: 0 %
Date Time Interrogation Session: 20170329113319
Implantable Lead Implant Date: 20070112
Implantable Lead Model: 5076
Implantable Lead Model: 5076
Lead Channel Pacing Threshold Amplitude: 0.5 V
Lead Channel Pacing Threshold Pulse Width: 0.4 ms
Lead Channel Pacing Threshold Pulse Width: 0.4 ms
Lead Channel Setting Pacing Amplitude: 2 V
Lead Channel Setting Sensing Sensitivity: 4 mV
MDC IDC LEAD IMPLANT DT: 20070112
MDC IDC LEAD LOCATION: 753859
MDC IDC LEAD LOCATION: 753860
MDC IDC MSMT BATTERY IMPEDANCE: 204 Ohm
MDC IDC MSMT LEADCHNL RA IMPEDANCE VALUE: 500 Ohm
MDC IDC MSMT LEADCHNL RV IMPEDANCE VALUE: 549 Ohm
MDC IDC MSMT LEADCHNL RV PACING THRESHOLD AMPLITUDE: 1 V
MDC IDC MSMT LEADCHNL RV SENSING INTR AMPL: 8 mV
MDC IDC SET LEADCHNL RA PACING AMPLITUDE: 1.5 V
MDC IDC SET LEADCHNL RV PACING PULSEWIDTH: 0.34 ms
MDC IDC STAT BRADY AP VP PERCENT: 0 %
MDC IDC STAT BRADY AS VS PERCENT: 1 %

## 2015-09-07 ENCOUNTER — Ambulatory Visit (INDEPENDENT_AMBULATORY_CARE_PROVIDER_SITE_OTHER): Payer: Medicare Other | Admitting: *Deleted

## 2015-09-07 DIAGNOSIS — I480 Paroxysmal atrial fibrillation: Secondary | ICD-10-CM

## 2015-09-07 DIAGNOSIS — Z7901 Long term (current) use of anticoagulants: Secondary | ICD-10-CM | POA: Diagnosis not present

## 2015-09-07 LAB — POCT INR: INR: 2.5

## 2015-09-13 DIAGNOSIS — Z96652 Presence of left artificial knee joint: Secondary | ICD-10-CM | POA: Diagnosis not present

## 2015-09-13 DIAGNOSIS — M7051 Other bursitis of knee, right knee: Secondary | ICD-10-CM | POA: Diagnosis not present

## 2015-09-13 DIAGNOSIS — Z09 Encounter for follow-up examination after completed treatment for conditions other than malignant neoplasm: Secondary | ICD-10-CM | POA: Diagnosis not present

## 2015-09-28 ENCOUNTER — Ambulatory Visit (INDEPENDENT_AMBULATORY_CARE_PROVIDER_SITE_OTHER): Payer: Medicare Other | Admitting: *Deleted

## 2015-09-28 DIAGNOSIS — Z7901 Long term (current) use of anticoagulants: Secondary | ICD-10-CM

## 2015-09-28 DIAGNOSIS — I4891 Unspecified atrial fibrillation: Secondary | ICD-10-CM | POA: Diagnosis not present

## 2015-09-28 LAB — POCT INR: INR: 3.5

## 2015-09-29 ENCOUNTER — Other Ambulatory Visit: Payer: Self-pay | Admitting: Internal Medicine

## 2015-10-10 DIAGNOSIS — H5203 Hypermetropia, bilateral: Secondary | ICD-10-CM | POA: Diagnosis not present

## 2015-10-10 DIAGNOSIS — H2513 Age-related nuclear cataract, bilateral: Secondary | ICD-10-CM | POA: Diagnosis not present

## 2015-10-12 ENCOUNTER — Ambulatory Visit (INDEPENDENT_AMBULATORY_CARE_PROVIDER_SITE_OTHER): Payer: Medicare Other | Admitting: *Deleted

## 2015-10-12 DIAGNOSIS — Z7901 Long term (current) use of anticoagulants: Secondary | ICD-10-CM | POA: Diagnosis not present

## 2015-10-12 DIAGNOSIS — I4891 Unspecified atrial fibrillation: Secondary | ICD-10-CM | POA: Diagnosis not present

## 2015-10-12 LAB — POCT INR: INR: 2.2

## 2015-10-16 ENCOUNTER — Encounter: Payer: Self-pay | Admitting: Cardiology

## 2015-10-18 DIAGNOSIS — L638 Other alopecia areata: Secondary | ICD-10-CM | POA: Diagnosis not present

## 2015-10-18 DIAGNOSIS — B078 Other viral warts: Secondary | ICD-10-CM | POA: Diagnosis not present

## 2015-10-18 DIAGNOSIS — L820 Inflamed seborrheic keratosis: Secondary | ICD-10-CM | POA: Diagnosis not present

## 2015-11-02 ENCOUNTER — Ambulatory Visit (INDEPENDENT_AMBULATORY_CARE_PROVIDER_SITE_OTHER): Payer: Medicare Other | Admitting: *Deleted

## 2015-11-02 DIAGNOSIS — Z7901 Long term (current) use of anticoagulants: Secondary | ICD-10-CM

## 2015-11-02 DIAGNOSIS — I4891 Unspecified atrial fibrillation: Secondary | ICD-10-CM | POA: Diagnosis not present

## 2015-11-02 LAB — POCT INR: INR: 3.2

## 2015-11-20 DIAGNOSIS — N401 Enlarged prostate with lower urinary tract symptoms: Secondary | ICD-10-CM | POA: Diagnosis not present

## 2015-11-21 ENCOUNTER — Telehealth: Payer: Self-pay | Admitting: Cardiology

## 2015-11-21 ENCOUNTER — Ambulatory Visit (INDEPENDENT_AMBULATORY_CARE_PROVIDER_SITE_OTHER): Payer: Medicare Other | Admitting: *Deleted

## 2015-11-21 DIAGNOSIS — I495 Sick sinus syndrome: Secondary | ICD-10-CM | POA: Diagnosis not present

## 2015-11-21 NOTE — Progress Notes (Signed)
Remote pacemaker transmission.   

## 2015-11-21 NOTE — Telephone Encounter (Signed)
Spoke with pt and reminded pt of remote transmission that is due today. Pt verbalized understanding.   

## 2015-11-22 DIAGNOSIS — N401 Enlarged prostate with lower urinary tract symptoms: Secondary | ICD-10-CM | POA: Diagnosis not present

## 2015-11-22 DIAGNOSIS — R351 Nocturia: Secondary | ICD-10-CM | POA: Diagnosis not present

## 2015-11-22 LAB — CUP PACEART REMOTE DEVICE CHECK
Battery Remaining Longevity: 137 mo
Brady Statistic AP VS Percent: 99 %
Brady Statistic AS VP Percent: 0 %
Implantable Lead Implant Date: 20070112
Implantable Lead Location: 753859
Implantable Lead Location: 753860
Implantable Lead Model: 5076
Lead Channel Pacing Threshold Amplitude: 1 V
Lead Channel Pacing Threshold Pulse Width: 0.4 ms
Lead Channel Sensing Intrinsic Amplitude: 8 mV
Lead Channel Setting Pacing Amplitude: 1.5 V
Lead Channel Setting Pacing Pulse Width: 0.34 ms
Lead Channel Setting Sensing Sensitivity: 4 mV
MDC IDC LEAD IMPLANT DT: 20070112
MDC IDC MSMT BATTERY IMPEDANCE: 204 Ohm
MDC IDC MSMT BATTERY VOLTAGE: 2.79 V
MDC IDC MSMT LEADCHNL RA IMPEDANCE VALUE: 507 Ohm
MDC IDC MSMT LEADCHNL RA PACING THRESHOLD AMPLITUDE: 0.5 V
MDC IDC MSMT LEADCHNL RA PACING THRESHOLD PULSEWIDTH: 0.4 ms
MDC IDC MSMT LEADCHNL RV IMPEDANCE VALUE: 560 Ohm
MDC IDC SESS DTM: 20170628163054
MDC IDC SET LEADCHNL RV PACING AMPLITUDE: 2 V
MDC IDC STAT BRADY AP VP PERCENT: 0 %
MDC IDC STAT BRADY AS VS PERCENT: 1 %

## 2015-11-23 ENCOUNTER — Ambulatory Visit (INDEPENDENT_AMBULATORY_CARE_PROVIDER_SITE_OTHER): Payer: Medicare Other | Admitting: *Deleted

## 2015-11-23 ENCOUNTER — Encounter: Payer: Self-pay | Admitting: Cardiology

## 2015-11-23 DIAGNOSIS — I4891 Unspecified atrial fibrillation: Secondary | ICD-10-CM | POA: Diagnosis not present

## 2015-11-23 DIAGNOSIS — Z7901 Long term (current) use of anticoagulants: Secondary | ICD-10-CM

## 2015-11-23 LAB — POCT INR: INR: 2.1

## 2015-12-01 DIAGNOSIS — M7061 Trochanteric bursitis, right hip: Secondary | ICD-10-CM | POA: Diagnosis not present

## 2015-12-21 ENCOUNTER — Ambulatory Visit (INDEPENDENT_AMBULATORY_CARE_PROVIDER_SITE_OTHER): Payer: Medicare Other | Admitting: Pharmacist

## 2015-12-21 DIAGNOSIS — Z7901 Long term (current) use of anticoagulants: Secondary | ICD-10-CM

## 2015-12-21 DIAGNOSIS — I4891 Unspecified atrial fibrillation: Secondary | ICD-10-CM

## 2015-12-21 LAB — POCT INR: INR: 4

## 2015-12-31 ENCOUNTER — Other Ambulatory Visit: Payer: Self-pay | Admitting: Internal Medicine

## 2016-01-08 ENCOUNTER — Ambulatory Visit (INDEPENDENT_AMBULATORY_CARE_PROVIDER_SITE_OTHER): Payer: Medicare Other | Admitting: *Deleted

## 2016-01-08 DIAGNOSIS — I4891 Unspecified atrial fibrillation: Secondary | ICD-10-CM | POA: Diagnosis not present

## 2016-01-08 DIAGNOSIS — Z7901 Long term (current) use of anticoagulants: Secondary | ICD-10-CM

## 2016-01-08 LAB — POCT INR: INR: 1.9

## 2016-01-31 DIAGNOSIS — D225 Melanocytic nevi of trunk: Secondary | ICD-10-CM | POA: Diagnosis not present

## 2016-01-31 DIAGNOSIS — T1490 Injury, unspecified: Secondary | ICD-10-CM | POA: Diagnosis not present

## 2016-01-31 DIAGNOSIS — L821 Other seborrheic keratosis: Secondary | ICD-10-CM | POA: Diagnosis not present

## 2016-02-01 ENCOUNTER — Ambulatory Visit (INDEPENDENT_AMBULATORY_CARE_PROVIDER_SITE_OTHER): Payer: Medicare Other | Admitting: *Deleted

## 2016-02-01 DIAGNOSIS — I4891 Unspecified atrial fibrillation: Secondary | ICD-10-CM

## 2016-02-01 DIAGNOSIS — Z7901 Long term (current) use of anticoagulants: Secondary | ICD-10-CM

## 2016-02-01 LAB — POCT INR: INR: 4

## 2016-02-15 ENCOUNTER — Ambulatory Visit (INDEPENDENT_AMBULATORY_CARE_PROVIDER_SITE_OTHER): Payer: Medicare Other | Admitting: *Deleted

## 2016-02-15 DIAGNOSIS — I4891 Unspecified atrial fibrillation: Secondary | ICD-10-CM | POA: Diagnosis not present

## 2016-02-15 DIAGNOSIS — Z7901 Long term (current) use of anticoagulants: Secondary | ICD-10-CM | POA: Diagnosis not present

## 2016-02-15 LAB — POCT INR: INR: 2.3

## 2016-02-20 ENCOUNTER — Telehealth: Payer: Self-pay | Admitting: Cardiology

## 2016-02-20 ENCOUNTER — Ambulatory Visit (INDEPENDENT_AMBULATORY_CARE_PROVIDER_SITE_OTHER): Payer: Medicare Other | Admitting: *Deleted

## 2016-02-20 DIAGNOSIS — I495 Sick sinus syndrome: Secondary | ICD-10-CM | POA: Diagnosis not present

## 2016-02-20 NOTE — Progress Notes (Signed)
Remote pacemaker transmission.   

## 2016-02-20 NOTE — Telephone Encounter (Signed)
Spoke with pt and reminded pt of remote transmission that is due today. Pt verbalized understanding.   

## 2016-02-27 ENCOUNTER — Encounter: Payer: Self-pay | Admitting: Cardiology

## 2016-02-27 DIAGNOSIS — F332 Major depressive disorder, recurrent severe without psychotic features: Secondary | ICD-10-CM | POA: Diagnosis not present

## 2016-02-29 DIAGNOSIS — I4891 Unspecified atrial fibrillation: Secondary | ICD-10-CM | POA: Diagnosis not present

## 2016-02-29 DIAGNOSIS — Z23 Encounter for immunization: Secondary | ICD-10-CM | POA: Diagnosis not present

## 2016-02-29 DIAGNOSIS — K219 Gastro-esophageal reflux disease without esophagitis: Secondary | ICD-10-CM | POA: Diagnosis not present

## 2016-02-29 DIAGNOSIS — I10 Essential (primary) hypertension: Secondary | ICD-10-CM | POA: Diagnosis not present

## 2016-02-29 DIAGNOSIS — G43909 Migraine, unspecified, not intractable, without status migrainosus: Secondary | ICD-10-CM | POA: Diagnosis not present

## 2016-02-29 DIAGNOSIS — Z950 Presence of cardiac pacemaker: Secondary | ICD-10-CM | POA: Diagnosis not present

## 2016-02-29 DIAGNOSIS — Z1389 Encounter for screening for other disorder: Secondary | ICD-10-CM | POA: Diagnosis not present

## 2016-02-29 DIAGNOSIS — H9319 Tinnitus, unspecified ear: Secondary | ICD-10-CM | POA: Diagnosis not present

## 2016-02-29 DIAGNOSIS — E7800 Pure hypercholesterolemia, unspecified: Secondary | ICD-10-CM | POA: Diagnosis not present

## 2016-02-29 DIAGNOSIS — Z1211 Encounter for screening for malignant neoplasm of colon: Secondary | ICD-10-CM | POA: Diagnosis not present

## 2016-02-29 DIAGNOSIS — M1990 Unspecified osteoarthritis, unspecified site: Secondary | ICD-10-CM | POA: Diagnosis not present

## 2016-02-29 DIAGNOSIS — J309 Allergic rhinitis, unspecified: Secondary | ICD-10-CM | POA: Diagnosis not present

## 2016-03-07 ENCOUNTER — Ambulatory Visit (INDEPENDENT_AMBULATORY_CARE_PROVIDER_SITE_OTHER): Payer: Medicare Other | Admitting: *Deleted

## 2016-03-07 ENCOUNTER — Encounter (INDEPENDENT_AMBULATORY_CARE_PROVIDER_SITE_OTHER): Payer: Self-pay

## 2016-03-07 DIAGNOSIS — I4891 Unspecified atrial fibrillation: Secondary | ICD-10-CM

## 2016-03-07 DIAGNOSIS — Z7901 Long term (current) use of anticoagulants: Secondary | ICD-10-CM | POA: Diagnosis not present

## 2016-03-07 LAB — POCT INR: INR: 2.9

## 2016-03-19 LAB — CUP PACEART REMOTE DEVICE CHECK
Battery Remaining Longevity: 134 mo
Battery Voltage: 2.8 V
Brady Statistic AP VP Percent: 0 %
Brady Statistic AS VP Percent: 0 %
Date Time Interrogation Session: 20170927145813
Implantable Lead Implant Date: 20070112
Implantable Lead Location: 753860
Implantable Lead Model: 5076
Implantable Lead Model: 5076
Lead Channel Impedance Value: 564 Ohm
Lead Channel Pacing Threshold Amplitude: 0.5 V
Lead Channel Pacing Threshold Pulse Width: 0.4 ms
Lead Channel Setting Pacing Amplitude: 2 V
Lead Channel Setting Pacing Pulse Width: 0.34 ms
MDC IDC LEAD IMPLANT DT: 20070112
MDC IDC LEAD LOCATION: 753859
MDC IDC MSMT BATTERY IMPEDANCE: 228 Ohm
MDC IDC MSMT LEADCHNL RV IMPEDANCE VALUE: 502 Ohm
MDC IDC MSMT LEADCHNL RV PACING THRESHOLD AMPLITUDE: 1 V
MDC IDC MSMT LEADCHNL RV PACING THRESHOLD PULSEWIDTH: 0.4 ms
MDC IDC MSMT LEADCHNL RV SENSING INTR AMPL: 8 mV
MDC IDC SET LEADCHNL RA PACING AMPLITUDE: 1.5 V
MDC IDC SET LEADCHNL RV SENSING SENSITIVITY: 4 mV
MDC IDC STAT BRADY AP VS PERCENT: 99 %
MDC IDC STAT BRADY AS VS PERCENT: 1 %

## 2016-03-24 ENCOUNTER — Telehealth: Payer: Self-pay | Admitting: Pharmacist

## 2016-03-24 NOTE — Telephone Encounter (Signed)
Received fax from [facility] that pt is scheduled for a colonoscopy on 04/24/16. Pt takes Coumadin for afib with CHADS2 score of 2 (HTN and age), no hx of stroke. [place] for pt to hold Coumadin for 5 days prior to procedure. Clearance faxed to [facility] at [phone].

## 2016-04-04 ENCOUNTER — Ambulatory Visit (INDEPENDENT_AMBULATORY_CARE_PROVIDER_SITE_OTHER): Payer: Medicare Other | Admitting: *Deleted

## 2016-04-04 DIAGNOSIS — Z7901 Long term (current) use of anticoagulants: Secondary | ICD-10-CM

## 2016-04-04 DIAGNOSIS — I4891 Unspecified atrial fibrillation: Secondary | ICD-10-CM

## 2016-04-04 LAB — POCT INR: INR: 3.3

## 2016-04-16 ENCOUNTER — Ambulatory Visit (INDEPENDENT_AMBULATORY_CARE_PROVIDER_SITE_OTHER): Payer: Medicare Other | Admitting: *Deleted

## 2016-04-16 DIAGNOSIS — I4891 Unspecified atrial fibrillation: Secondary | ICD-10-CM | POA: Diagnosis not present

## 2016-04-16 DIAGNOSIS — Z7901 Long term (current) use of anticoagulants: Secondary | ICD-10-CM

## 2016-04-16 LAB — POCT INR: INR: 2.9

## 2016-04-23 ENCOUNTER — Other Ambulatory Visit: Payer: Self-pay | Admitting: Internal Medicine

## 2016-04-24 DIAGNOSIS — Z800 Family history of malignant neoplasm of digestive organs: Secondary | ICD-10-CM | POA: Diagnosis not present

## 2016-04-24 DIAGNOSIS — Z1211 Encounter for screening for malignant neoplasm of colon: Secondary | ICD-10-CM | POA: Diagnosis not present

## 2016-05-01 ENCOUNTER — Ambulatory Visit (INDEPENDENT_AMBULATORY_CARE_PROVIDER_SITE_OTHER): Payer: Medicare Other | Admitting: *Deleted

## 2016-05-01 ENCOUNTER — Encounter: Payer: Self-pay | Admitting: *Deleted

## 2016-05-01 ENCOUNTER — Encounter (INDEPENDENT_AMBULATORY_CARE_PROVIDER_SITE_OTHER): Payer: Self-pay

## 2016-05-01 DIAGNOSIS — Z7901 Long term (current) use of anticoagulants: Secondary | ICD-10-CM

## 2016-05-01 DIAGNOSIS — I4891 Unspecified atrial fibrillation: Secondary | ICD-10-CM

## 2016-05-01 LAB — POCT INR: INR: 2

## 2016-05-06 ENCOUNTER — Encounter: Payer: Self-pay | Admitting: Internal Medicine

## 2016-05-06 ENCOUNTER — Ambulatory Visit (INDEPENDENT_AMBULATORY_CARE_PROVIDER_SITE_OTHER): Payer: Medicare Other | Admitting: Internal Medicine

## 2016-05-06 VITALS — BP 124/86 | HR 80 | Ht 67.0 in | Wt 190.2 lb

## 2016-05-06 DIAGNOSIS — Z950 Presence of cardiac pacemaker: Secondary | ICD-10-CM | POA: Diagnosis not present

## 2016-05-06 DIAGNOSIS — I480 Paroxysmal atrial fibrillation: Secondary | ICD-10-CM

## 2016-05-06 LAB — CUP PACEART INCLINIC DEVICE CHECK
Battery Remaining Longevity: 134 mo
Brady Statistic AP VP Percent: 0 %
Brady Statistic AP VS Percent: 99 %
Brady Statistic AS VP Percent: 0 %
Brady Statistic AS VS Percent: 1 %
Date Time Interrogation Session: 20171212105934
Implantable Lead Implant Date: 20070112
Implantable Lead Location: 753859
Implantable Lead Location: 753860
Implantable Pulse Generator Implant Date: 20131018
Lead Channel Impedance Value: 591 Ohm
Lead Channel Pacing Threshold Amplitude: 0.875 V
Lead Channel Pacing Threshold Amplitude: 1 V
Lead Channel Pacing Threshold Pulse Width: 0.34 ms
Lead Channel Pacing Threshold Pulse Width: 0.34 ms
Lead Channel Sensing Intrinsic Amplitude: 11.2 mV
Lead Channel Setting Pacing Amplitude: 2 V
Lead Channel Setting Pacing Amplitude: 2.5 V
Lead Channel Setting Pacing Pulse Width: 0.34 ms
Lead Channel Setting Sensing Sensitivity: 2.8 mV
MDC IDC LEAD IMPLANT DT: 20070112
MDC IDC MSMT BATTERY IMPEDANCE: 228 Ohm
MDC IDC MSMT BATTERY VOLTAGE: 2.8 V
MDC IDC MSMT LEADCHNL RA IMPEDANCE VALUE: 530 Ohm
MDC IDC MSMT LEADCHNL RA PACING THRESHOLD AMPLITUDE: 0.5 V
MDC IDC MSMT LEADCHNL RA PACING THRESHOLD AMPLITUDE: 0.5 V
MDC IDC MSMT LEADCHNL RA PACING THRESHOLD PULSEWIDTH: 0.4 ms
MDC IDC MSMT LEADCHNL RV PACING THRESHOLD PULSEWIDTH: 0.4 ms

## 2016-05-06 NOTE — Patient Instructions (Signed)
Medication Instructions:  Your physician recommends that you continue on your current medications as directed. Please refer to the Current Medication list given to you today.   Labwork: None Ordered   Testing/Procedures: None Ordered   Follow-Up: Your physician wants you to follow-up in: 1 year with Dr. Lovena Le. You will receive a reminder letter in the mail two months in advance. If you don't receive a letter, please call our office to schedule the follow-up appointment.  Remote monitoring is used to monitor your Pacemaker  from home. This monitoring reduces the number of office visits required to check your device to one time per year. It allows [place] to keep an eye on the functioning of your device to ensure it is working properly. You are scheduled for a device check from home on 08/05/16. You may send your transmission at any time that day. If you have a wireless device, the transmission will be sent automatically. After your physician reviews your transmission, you will receive a postcard with your next transmission date.     Any Other Special Instructions Will Be Listed Below (If Applicable).     If you need a refill on your cardiac medications before your next appointment, please call your pharmacy.

## 2016-05-06 NOTE — Progress Notes (Signed)
HPI [name] [name] returns today for followup. He is a very pleasant 75 year old man with paroxysmal atrial fibrillation, status post ablation, hypertension, symptomatic bradycardia, status post permanent pacemaker insertion. He denies palpitations, chest pain, or shortness of breath. No peripheral edema. He has rare palpitations. Allergies  Allergen Reactions  . Iohexol      Code: HIVES, Desc: POST MYELOGRAM.  PT ALSO SAID THIS HAPPENED A YEAR AGO WITH A MYELOGRAM AND WITH ESI'S., Onset Date: [id number]   . Meperidine Hcl     REACTION: unspecified  . Prednisone     Redness of skin & jittery     Current Outpatient Prescriptions  Medication Sig Dispense Refill  . ALPRAZolam (XANAX) 0.5 MG tablet Take 1 tablet (0.5 mg total) by mouth 3 (three) times daily as needed for anxiety. 90 tablet 0  . ferrous sulfate 325 (65 FE) MG tablet Take 1 tablet (325 mg total) by mouth daily with breakfast. For low iron.  3  . FLUoxetine (PROZAC) 40 MG capsule Take one tablet each day for depression. (40mg  ) (Patient taking differently: Take one tablet by mouth each day for depression. (40mg  )) 30 capsule 0  . losartan (COZAAR) 50 MG tablet TAKE ONE TABLET BY MOUTH ONCE DAILY 90 tablet 0  . meloxicam (MOBIC) 15 MG tablet Take 15 mg by mouth daily.    . potassium chloride SA (K-DUR,KLOR-CON) 20 MEQ tablet Take one tablet two times a day for low potassium. (Patient taking differently: Take one tablet by mouth two times a day for low potassium.)    . propranolol (INDERAL) 20 MG tablet TAKE ONE TABLET BY MOUTH THREE TIMES DAILY 90 tablet 11  . tamsulosin (FLOMAX) 0.4 MG CAPS capsule Take 0.4 mg by mouth daily.     Marland Kitchen warfarin (COUMADIN) 5 MG tablet TAKE AS DIRECTED BY ANTICOAGULATION CLINIC 30 tablet 3   No current facility-administered medications for this visit.      Past Medical History:  Diagnosis Date  . Anemia    since post op- knee replacement   . Anxiety    takes xanax 2 times per day, taken mostly for  ringing in ears    . Arthritis of knee, right    end-stage  . Bronchitis   . Bursitis   . Complication of anesthesia    irritation in throat postop, makes a-fib worse   . Depression   . DJD (degenerative joint disease)    OA- "everywhere"  . Family history of anesthesia complication    sister with nausia and vomiting  . Gout   . Heart murmur   . HTN (hypertension)   . Hyperlipidemia   . Migraines   . Paroxysmal atrial fibrillation (HCC)   . PONV (postoperative nausea and vomiting)   . Tinnitus     ROS:   All systems reviewed and negative except as noted in the HPI.   Past Surgical History:  Procedure Laterality Date  . APPENDECTOMY    . Atrial fibrillation ablation  2009, 12/02/11   PVI by Dr Rayann Heman x 2  . ATRIAL FIBRILLATION ABLATION N/A 12/02/2011   Procedure: ATRIAL FIBRILLATION ABLATION;  Surgeon: Thompson Grayer, MD;  Location: [facility] CATH LAB;  Service: Cardiovascular;  Laterality: N/A;  . bilteral thumb joint surgrery    . CARDIAC CATHETERIZATION  2005    which revealed 40% stenosis of the mid left anterior descending artery., ablation- 2009  . CHOLECYSTECTOMY    . JOINT REPLACEMENT     L knee- 12/11,R hip- 1996  .  left rotator cuff repair    . PACEMAKER INSERTION  06/06/2005   Medtronic EnRhythm dual-chamber. For sick sinus syndrome  . PERMANENT PACEMAKER GENERATOR CHANGE N/A 03/12/2012   Procedure: PERMANENT PACEMAKER GENERATOR CHANGE;  Surgeon: Evans Lance, MD;  Location: [facility] CATH LAB;  Service: Cardiovascular;  Laterality: N/A;  . RHINOPLASTY    . right rotator cuff surgery  2006  . right total hip arthroplasty    . TEE WITHOUT CARDIOVERSION  12/01/2011   Procedure: TRANSESOPHAGEAL ECHOCARDIOGRAM (TEE);  Surgeon: Fay Records, MD;  Location: [facility] ENDOSCOPY;  Service: Cardiovascular;  Laterality: N/A;  a-fib ablation following day  . TONSILLECTOMY     as a child  . TOTAL KNEE REVISION  08/08/2011   Procedure: TOTAL KNEE REVISION;  Surgeon: Kerin Salen, MD;  Location:  [place];  Service: Orthopedics;  Laterality: Left;  DEPUY/SIGMA  . TRANSESOPHAGEAL ECHOCARDIOGRAM  04/2008  . TRANSTHORACIC ECHOCARDIOGRAM  2006,2009  . uvulectomy       Family History  Problem Relation Age of Onset  . Coronary artery disease Mother   . Cerebrovascular Disease Mother   . Cancer Other   . Anesthesia problems Neg Hx   . Hypotension Neg Hx   . Malignant hyperthermia Neg Hx   . Pseudochol deficiency Neg Hx      Social History   Social History  . Marital status: Married    Spouse name: N/A  . Number of children: 2  . Years of education: N/A   Occupational History  . retired Retired   Social History Main Topics  . Smoking status: Never Smoker  . Smokeless tobacco: Never Used  . Alcohol use Yes     Comment: rare use  . Drug use: No  . Sexual activity: Not on file   Other Topics Concern  . Not on file   Social History Narrative  . No narrative on file     BP 124/86 (BP Location: Left Arm, Patient Position: Sitting, Cuff Size: Normal)   Pulse 80   Ht 5\' 7"  (1.702 m)   Wt 190 lb 4 oz (86.3 kg)   BMI 29.80 kg/m   Physical Exam:  Well appearing 75 year old man, NAD HEENT: Unremarkable Neck:  6 cm JVD, no thyromegally Lungs:  Clear with no wheezes HEART:  Regular rate rhythm, no murmurs, no rubs, no clicks Abd:  soft, positive bowel sounds, no organomegally, no rebound, no guarding Ext:  2 plus pulses, no edema, no cyanosis, no clubbing Skin:  No rashes no nodules Neuro:  CN II through XII intact, motor grossly intact  DEVICE  Normal device function.  See PaceArt for details.   Assess/Plan:  1. PAF - he is maintaining NSR 98% of the time. He will continue his current meds. 2. HTN - his blood pressure is well controlled. Will follow. 3. PPM - his medtronic DDD PM is working normally. Will recheck in several months.  [name] [name].D.

## 2016-05-16 ENCOUNTER — Other Ambulatory Visit: Payer: Self-pay | Admitting: Internal Medicine

## 2016-05-22 ENCOUNTER — Ambulatory Visit (INDEPENDENT_AMBULATORY_CARE_PROVIDER_SITE_OTHER): Payer: Medicare Other | Admitting: *Deleted

## 2016-05-22 DIAGNOSIS — I4891 Unspecified atrial fibrillation: Secondary | ICD-10-CM

## 2016-05-22 DIAGNOSIS — Z7901 Long term (current) use of anticoagulants: Secondary | ICD-10-CM | POA: Diagnosis not present

## 2016-05-22 LAB — POCT INR: INR: 3.4

## 2016-05-28 DIAGNOSIS — F332 Major depressive disorder, recurrent severe without psychotic features: Secondary | ICD-10-CM | POA: Diagnosis not present

## 2016-06-05 ENCOUNTER — Ambulatory Visit (INDEPENDENT_AMBULATORY_CARE_PROVIDER_SITE_OTHER): Payer: Medicare Other | Admitting: *Deleted

## 2016-06-05 DIAGNOSIS — I4891 Unspecified atrial fibrillation: Secondary | ICD-10-CM

## 2016-06-05 DIAGNOSIS — Z7901 Long term (current) use of anticoagulants: Secondary | ICD-10-CM | POA: Diagnosis not present

## 2016-06-05 LAB — POCT INR: INR: 2.8

## 2016-06-19 ENCOUNTER — Other Ambulatory Visit: Payer: Self-pay | Admitting: Internal Medicine

## 2016-06-20 ENCOUNTER — Other Ambulatory Visit: Payer: Self-pay | Admitting: *Deleted

## 2016-06-20 MED ORDER — WARFARIN SODIUM 5 MG PO TABS: ORAL_TABLET | ORAL | 3 refills | Status: DC

## 2016-06-26 ENCOUNTER — Ambulatory Visit (INDEPENDENT_AMBULATORY_CARE_PROVIDER_SITE_OTHER): Payer: Medicare Other | Admitting: *Deleted

## 2016-06-26 DIAGNOSIS — Z7901 Long term (current) use of anticoagulants: Secondary | ICD-10-CM | POA: Diagnosis not present

## 2016-06-26 DIAGNOSIS — I4891 Unspecified atrial fibrillation: Secondary | ICD-10-CM | POA: Diagnosis not present

## 2016-06-26 LAB — POCT INR: INR: 3.3

## 2016-07-07 DIAGNOSIS — L570 Actinic keratosis: Secondary | ICD-10-CM | POA: Diagnosis not present

## 2016-07-07 DIAGNOSIS — X32XXXD Exposure to sunlight, subsequent encounter: Secondary | ICD-10-CM | POA: Diagnosis not present

## 2016-07-17 ENCOUNTER — Ambulatory Visit (INDEPENDENT_AMBULATORY_CARE_PROVIDER_SITE_OTHER): Payer: Medicare Other | Admitting: Pharmacist

## 2016-07-17 DIAGNOSIS — Z7901 Long term (current) use of anticoagulants: Secondary | ICD-10-CM

## 2016-07-17 DIAGNOSIS — I4891 Unspecified atrial fibrillation: Secondary | ICD-10-CM | POA: Diagnosis not present

## 2016-07-17 LAB — POCT INR: INR: 1.9

## 2016-07-31 DIAGNOSIS — J069 Acute upper respiratory infection, unspecified: Secondary | ICD-10-CM | POA: Diagnosis not present

## 2016-08-14 ENCOUNTER — Ambulatory Visit (INDEPENDENT_AMBULATORY_CARE_PROVIDER_SITE_OTHER): Payer: Medicare Other | Admitting: *Deleted

## 2016-08-14 DIAGNOSIS — I4891 Unspecified atrial fibrillation: Secondary | ICD-10-CM | POA: Diagnosis not present

## 2016-08-14 DIAGNOSIS — Z7901 Long term (current) use of anticoagulants: Secondary | ICD-10-CM | POA: Diagnosis not present

## 2016-08-14 LAB — POCT INR: INR: 1.9

## 2016-08-25 ENCOUNTER — Other Ambulatory Visit: Payer: Self-pay | Admitting: Internal Medicine

## 2016-09-12 ENCOUNTER — Ambulatory Visit (INDEPENDENT_AMBULATORY_CARE_PROVIDER_SITE_OTHER): Payer: Medicare Other | Admitting: *Deleted

## 2016-09-12 DIAGNOSIS — I4891 Unspecified atrial fibrillation: Secondary | ICD-10-CM

## 2016-09-12 DIAGNOSIS — Z7901 Long term (current) use of anticoagulants: Secondary | ICD-10-CM | POA: Diagnosis not present

## 2016-09-12 LAB — POCT INR: INR: 3.7

## 2016-10-03 ENCOUNTER — Ambulatory Visit (INDEPENDENT_AMBULATORY_CARE_PROVIDER_SITE_OTHER): Payer: Medicare Other | Admitting: Pharmacist

## 2016-10-03 DIAGNOSIS — Z7901 Long term (current) use of anticoagulants: Secondary | ICD-10-CM

## 2016-10-03 DIAGNOSIS — I4891 Unspecified atrial fibrillation: Secondary | ICD-10-CM | POA: Diagnosis not present

## 2016-10-03 LAB — POCT INR: INR: 2.9

## 2016-10-29 DIAGNOSIS — F332 Major depressive disorder, recurrent severe without psychotic features: Secondary | ICD-10-CM | POA: Diagnosis not present

## 2016-10-31 ENCOUNTER — Encounter (INDEPENDENT_AMBULATORY_CARE_PROVIDER_SITE_OTHER): Payer: Self-pay

## 2016-10-31 ENCOUNTER — Ambulatory Visit (INDEPENDENT_AMBULATORY_CARE_PROVIDER_SITE_OTHER): Payer: Medicare Other | Admitting: Pharmacist

## 2016-10-31 DIAGNOSIS — I4891 Unspecified atrial fibrillation: Secondary | ICD-10-CM

## 2016-10-31 DIAGNOSIS — Z7901 Long term (current) use of anticoagulants: Secondary | ICD-10-CM

## 2016-10-31 LAB — POCT INR: INR: 2.8

## 2016-11-25 DIAGNOSIS — N401 Enlarged prostate with lower urinary tract symptoms: Secondary | ICD-10-CM | POA: Diagnosis not present

## 2016-11-28 ENCOUNTER — Ambulatory Visit (INDEPENDENT_AMBULATORY_CARE_PROVIDER_SITE_OTHER): Payer: Medicare Other | Admitting: *Deleted

## 2016-11-28 DIAGNOSIS — Z7901 Long term (current) use of anticoagulants: Secondary | ICD-10-CM | POA: Diagnosis not present

## 2016-11-28 DIAGNOSIS — I4891 Unspecified atrial fibrillation: Secondary | ICD-10-CM

## 2016-11-28 LAB — POCT INR: INR: 2.4

## 2016-12-02 DIAGNOSIS — N401 Enlarged prostate with lower urinary tract symptoms: Secondary | ICD-10-CM | POA: Diagnosis not present

## 2016-12-02 DIAGNOSIS — R3911 Hesitancy of micturition: Secondary | ICD-10-CM | POA: Diagnosis not present

## 2016-12-31 ENCOUNTER — Ambulatory Visit (INDEPENDENT_AMBULATORY_CARE_PROVIDER_SITE_OTHER): Payer: Medicare Other | Admitting: *Deleted

## 2016-12-31 DIAGNOSIS — Z7901 Long term (current) use of anticoagulants: Secondary | ICD-10-CM | POA: Diagnosis not present

## 2016-12-31 DIAGNOSIS — I4891 Unspecified atrial fibrillation: Secondary | ICD-10-CM

## 2016-12-31 LAB — POCT INR: INR: 4

## 2017-01-02 DIAGNOSIS — L570 Actinic keratosis: Secondary | ICD-10-CM | POA: Diagnosis not present

## 2017-01-02 DIAGNOSIS — L821 Other seborrheic keratosis: Secondary | ICD-10-CM | POA: Diagnosis not present

## 2017-01-02 DIAGNOSIS — X32XXXD Exposure to sunlight, subsequent encounter: Secondary | ICD-10-CM | POA: Diagnosis not present

## 2017-01-02 DIAGNOSIS — Z1283 Encounter for screening for malignant neoplasm of skin: Secondary | ICD-10-CM | POA: Diagnosis not present

## 2017-01-21 ENCOUNTER — Ambulatory Visit (INDEPENDENT_AMBULATORY_CARE_PROVIDER_SITE_OTHER): Payer: Medicare Other | Admitting: *Deleted

## 2017-01-21 DIAGNOSIS — I4891 Unspecified atrial fibrillation: Secondary | ICD-10-CM | POA: Diagnosis not present

## 2017-01-21 DIAGNOSIS — Z7901 Long term (current) use of anticoagulants: Secondary | ICD-10-CM

## 2017-01-21 LAB — POCT INR: INR: 3.4

## 2017-02-05 DIAGNOSIS — K591 Functional diarrhea: Secondary | ICD-10-CM | POA: Diagnosis not present

## 2017-02-05 DIAGNOSIS — R159 Full incontinence of feces: Secondary | ICD-10-CM | POA: Diagnosis not present

## 2017-02-05 DIAGNOSIS — R110 Nausea: Secondary | ICD-10-CM | POA: Diagnosis not present

## 2017-02-05 DIAGNOSIS — R1013 Epigastric pain: Secondary | ICD-10-CM | POA: Diagnosis not present

## 2017-02-06 ENCOUNTER — Ambulatory Visit (INDEPENDENT_AMBULATORY_CARE_PROVIDER_SITE_OTHER): Payer: Medicare Other

## 2017-02-06 DIAGNOSIS — Z7901 Long term (current) use of anticoagulants: Secondary | ICD-10-CM

## 2017-02-06 DIAGNOSIS — I4891 Unspecified atrial fibrillation: Secondary | ICD-10-CM

## 2017-02-06 LAB — POCT INR: INR: 2.4

## 2017-02-20 DIAGNOSIS — K591 Functional diarrhea: Secondary | ICD-10-CM | POA: Diagnosis not present

## 2017-02-20 DIAGNOSIS — R1013 Epigastric pain: Secondary | ICD-10-CM | POA: Diagnosis not present

## 2017-02-27 ENCOUNTER — Encounter (INDEPENDENT_AMBULATORY_CARE_PROVIDER_SITE_OTHER): Payer: Self-pay

## 2017-02-27 ENCOUNTER — Ambulatory Visit (INDEPENDENT_AMBULATORY_CARE_PROVIDER_SITE_OTHER): Payer: Medicare Other | Admitting: *Deleted

## 2017-02-27 DIAGNOSIS — Z7901 Long term (current) use of anticoagulants: Secondary | ICD-10-CM | POA: Diagnosis not present

## 2017-02-27 DIAGNOSIS — I4891 Unspecified atrial fibrillation: Secondary | ICD-10-CM

## 2017-02-27 DIAGNOSIS — Z5181 Encounter for therapeutic drug level monitoring: Secondary | ICD-10-CM

## 2017-02-27 LAB — POCT INR: INR: 2.3

## 2017-03-02 DIAGNOSIS — G43909 Migraine, unspecified, not intractable, without status migrainosus: Secondary | ICD-10-CM | POA: Diagnosis not present

## 2017-03-02 DIAGNOSIS — I4891 Unspecified atrial fibrillation: Secondary | ICD-10-CM | POA: Diagnosis not present

## 2017-03-02 DIAGNOSIS — Z1389 Encounter for screening for other disorder: Secondary | ICD-10-CM | POA: Diagnosis not present

## 2017-03-02 DIAGNOSIS — M1990 Unspecified osteoarthritis, unspecified site: Secondary | ICD-10-CM | POA: Diagnosis not present

## 2017-03-02 DIAGNOSIS — I10 Essential (primary) hypertension: Secondary | ICD-10-CM | POA: Diagnosis not present

## 2017-03-02 DIAGNOSIS — E669 Obesity, unspecified: Secondary | ICD-10-CM | POA: Diagnosis not present

## 2017-03-02 DIAGNOSIS — K219 Gastro-esophageal reflux disease without esophagitis: Secondary | ICD-10-CM | POA: Diagnosis not present

## 2017-03-02 DIAGNOSIS — E7800 Pure hypercholesterolemia, unspecified: Secondary | ICD-10-CM | POA: Diagnosis not present

## 2017-03-02 DIAGNOSIS — H9319 Tinnitus, unspecified ear: Secondary | ICD-10-CM | POA: Diagnosis not present

## 2017-03-02 DIAGNOSIS — J309 Allergic rhinitis, unspecified: Secondary | ICD-10-CM | POA: Diagnosis not present

## 2017-03-02 DIAGNOSIS — Z950 Presence of cardiac pacemaker: Secondary | ICD-10-CM | POA: Diagnosis not present

## 2017-03-10 ENCOUNTER — Telehealth: Payer: Self-pay | Admitting: Internal Medicine

## 2017-03-10 ENCOUNTER — Encounter: Payer: Self-pay | Admitting: Internal Medicine

## 2017-03-10 NOTE — Telephone Encounter (Signed)
Walk In pt Form-[employer] [facility] Verification Dropped off. Placed in Dr. Tanna Furry Doc Box.

## 2017-03-27 ENCOUNTER — Ambulatory Visit (INDEPENDENT_AMBULATORY_CARE_PROVIDER_SITE_OTHER): Payer: Medicare Other | Admitting: Pharmacist

## 2017-03-27 DIAGNOSIS — Z7901 Long term (current) use of anticoagulants: Secondary | ICD-10-CM | POA: Diagnosis not present

## 2017-03-27 DIAGNOSIS — I4891 Unspecified atrial fibrillation: Secondary | ICD-10-CM | POA: Diagnosis not present

## 2017-03-27 DIAGNOSIS — Z5181 Encounter for therapeutic drug level monitoring: Secondary | ICD-10-CM

## 2017-03-27 LAB — POCT INR: INR: 2.3

## 2017-04-29 DIAGNOSIS — F332 Major depressive disorder, recurrent severe without psychotic features: Secondary | ICD-10-CM | POA: Diagnosis not present

## 2017-04-30 NOTE — Progress Notes (Signed)
Pt arrived 04/28/2017 with notary for Dr. Lovena Le to sign for Pt to NOT get a smart meter with [employer].

## 2017-05-01 ENCOUNTER — Encounter: Payer: Self-pay | Admitting: Internal Medicine

## 2017-05-20 ENCOUNTER — Encounter (INDEPENDENT_AMBULATORY_CARE_PROVIDER_SITE_OTHER): Payer: Self-pay

## 2017-05-20 ENCOUNTER — Ambulatory Visit (INDEPENDENT_AMBULATORY_CARE_PROVIDER_SITE_OTHER): Payer: Medicare Other | Admitting: Internal Medicine

## 2017-05-20 ENCOUNTER — Encounter: Payer: Self-pay | Admitting: Internal Medicine

## 2017-05-20 ENCOUNTER — Ambulatory Visit (INDEPENDENT_AMBULATORY_CARE_PROVIDER_SITE_OTHER): Payer: Medicare Other | Admitting: *Deleted

## 2017-05-20 VITALS — BP 134/68 | HR 76 | Ht 66.0 in | Wt 188.5 lb

## 2017-05-20 DIAGNOSIS — Z5181 Encounter for therapeutic drug level monitoring: Secondary | ICD-10-CM | POA: Diagnosis not present

## 2017-05-20 DIAGNOSIS — I480 Paroxysmal atrial fibrillation: Secondary | ICD-10-CM

## 2017-05-20 DIAGNOSIS — Z950 Presence of cardiac pacemaker: Secondary | ICD-10-CM

## 2017-05-20 DIAGNOSIS — Z7901 Long term (current) use of anticoagulants: Secondary | ICD-10-CM

## 2017-05-20 DIAGNOSIS — I4891 Unspecified atrial fibrillation: Secondary | ICD-10-CM | POA: Diagnosis not present

## 2017-05-20 DIAGNOSIS — I495 Sick sinus syndrome: Secondary | ICD-10-CM

## 2017-05-20 LAB — POCT INR: INR: 2.4

## 2017-05-20 NOTE — Patient Instructions (Signed)
Medication Instructions:  Your physician recommends that you continue on your current medications as directed. Please refer to the Current Medication list given to you today.  Labwork: None ordered.  Testing/Procedures: None ordered.  Follow-Up: Your physician wants you to follow-up in: one year with Dr. Lovena Le.   You will receive a reminder letter in the mail two months in advance. If you don't receive a letter, please call our office to schedule the follow-up appointment.  Remote monitoring is used to monitor your Pacemaker from home. This monitoring reduces the number of office visits required to check your device to one time per year. It allows [place] to keep an eye on the functioning of your device to ensure it is working properly. You are scheduled for a device check from home on 08/19/2017. You may send your transmission at any time that day. If you have a wireless device, the transmission will be sent automatically. After your physician reviews your transmission, you will receive a postcard with your next transmission date.   Any Other Special Instructions Will Be Listed Below (If Applicable).  If you need a refill on your cardiac medications before your next appointment, please call your pharmacy.

## 2017-05-20 NOTE — Patient Instructions (Signed)
Description   Continue on same dosage 1/2 tablet daily except 1 tablet on Sundays and Thursdays. Continue 3 servings of greens a week. Recheck INR in 6 weeks.  Call with any questions [phone]

## 2017-05-20 NOTE — Progress Notes (Signed)
HPI [name] returns today for ongoing evaluation and management of PAF, s/p ablation, PPM due to sinus node dysfunction. In the interim, he has undergone knee replacement surgery. He still has some residual pain. He denies chest pain or sob. No syncope. Allergies  Allergen Reactions  . Iohexol      Code: HIVES, Desc: POST MYELOGRAM.  PT ALSO SAID THIS HAPPENED A YEAR AGO WITH A MYELOGRAM AND WITH ESI'S., Onset Date: [id number]   . Meperidine Hcl     REACTION: unspecified  . Prednisone     Redness of skin & jittery     Current Outpatient Medications  Medication Sig Dispense Refill  . ALPRAZolam (XANAX) 0.5 MG tablet Take 1 tablet (0.5 mg total) by mouth 3 (three) times daily as needed for anxiety. 90 tablet 0  . ferrous sulfate 325 (65 FE) MG tablet Take 1 tablet (325 mg total) by mouth daily with breakfast. For low iron.  3  . FLUoxetine (PROZAC) 40 MG capsule Take one tablet each day for depression. (40mg  ) (Patient taking differently: Take one tablet by mouth each day for depression. (40mg  )) 30 capsule 0  . losartan (COZAAR) 50 MG tablet TAKE ONE TABLET BY MOUTH ONCE DAILY 90 tablet 3  . meloxicam (MOBIC) 15 MG tablet Take 15 mg by mouth daily.    . potassium chloride SA (K-DUR,KLOR-CON) 20 MEQ tablet Take one tablet two times a day for low potassium. (Patient taking differently: Take one tablet by mouth two times a day for low potassium.)    . propranolol (INDERAL) 20 MG tablet Take 1 tablet (20 mg total) by mouth 3 (three) times daily. 270 tablet 3  . tamsulosin (FLOMAX) 0.4 MG CAPS capsule Take 0.4 mg by mouth daily.     Marland Kitchen warfarin (COUMADIN) 5 MG tablet Take 1/2 tablet daily except 1 tablet on Sun/Tues/Thurs or as directed by ANTICOAGULATION CLINIC 30 tablet 3   No current facility-administered medications for this visit.      Past Medical History:  Diagnosis Date  . Anemia    since post op- knee replacement   . Anxiety    takes xanax 2 times per day, taken  mostly for ringing in ears    . Arthritis of knee, right    end-stage  . Bronchitis   . Bursitis   . Complication of anesthesia    irritation in throat postop, makes a-fib worse   . Depression   . DJD (degenerative joint disease)    OA- "everywhere"  . Family history of anesthesia complication    sister with nausia and vomiting  . Gout   . Heart murmur   . HTN (hypertension)   . Hyperlipidemia   . Migraines   . Paroxysmal atrial fibrillation (HCC)   . PONV (postoperative nausea and vomiting)   . Tinnitus     ROS:   All systems reviewed and negative except as noted in the HPI.   Past Surgical History:  Procedure Laterality Date  . APPENDECTOMY    . Atrial fibrillation ablation  2009, 12/02/11   PVI by Dr Rayann Heman x 2  . ATRIAL FIBRILLATION ABLATION N/A 12/02/2011   Procedure: ATRIAL FIBRILLATION ABLATION;  Surgeon: Thompson Grayer, MD;  Location: [facility] CATH LAB;  Service: Cardiovascular;  Laterality: N/A;  . bilteral thumb joint surgrery    . CARDIAC CATHETERIZATION  2005    which revealed 40% stenosis of the mid left anterior descending artery., ablation- 2009  . CHOLECYSTECTOMY    .  JOINT REPLACEMENT     L knee- 12/11,R hip- 1996  . left rotator cuff repair    . PACEMAKER INSERTION  06/06/2005   Medtronic EnRhythm dual-chamber. For sick sinus syndrome  . PERMANENT PACEMAKER GENERATOR CHANGE N/A 03/12/2012   Procedure: PERMANENT PACEMAKER GENERATOR CHANGE;  Surgeon: Evans Lance, MD;  Location: [facility] CATH LAB;  Service: Cardiovascular;  Laterality: N/A;  . RHINOPLASTY    . right rotator cuff surgery  2006  . right total hip arthroplasty    . TEE WITHOUT CARDIOVERSION  12/01/2011   Procedure: TRANSESOPHAGEAL ECHOCARDIOGRAM (TEE);  Surgeon: Fay Records, MD;  Location: [facility] ENDOSCOPY;  Service: Cardiovascular;  Laterality: N/A;  a-fib ablation following day  . TONSILLECTOMY     as a child  . TOTAL KNEE REVISION  08/08/2011   Procedure: TOTAL KNEE REVISION;  Surgeon: Kerin Salen, MD;   Location: [place];  Service: Orthopedics;  Laterality: Left;  DEPUY/SIGMA  . TRANSESOPHAGEAL ECHOCARDIOGRAM  04/2008  . TRANSTHORACIC ECHOCARDIOGRAM  2006,2009  . uvulectomy       Family History  Problem Relation Age of Onset  . Coronary artery disease Mother   . Cerebrovascular Disease Mother   . Cancer Other   . Anesthesia problems Neg Hx   . Hypotension Neg Hx   . Malignant hyperthermia Neg Hx   . Pseudochol deficiency Neg Hx      Social History   Socioeconomic History  . Marital status: Married    Spouse name: Not on file  . Number of children: 2  . Years of education: Not on file  . Highest education level: Not on file  Social Needs  . Financial resource strain: Not on file  . Food insecurity - worry: Not on file  . Food insecurity - inability: Not on file  . Transportation needs - medical: Not on file  . Transportation needs - non-medical: Not on file  Occupational History  . Occupation: retired    Fish farm manager: RETIRED  Tobacco Use  . Smoking status: Never Smoker  . Smokeless tobacco: Never Used  Substance and Sexual Activity  . Alcohol use: Yes    Comment: rare use  . Drug use: No  . Sexual activity: Not on file  Other Topics Concern  . Not on file  Social History Narrative  . Not on file     BP 134/68   Pulse 76   Ht 5\' 6"  (1.676 m)   Wt 188 lb 8 oz (85.5 kg)   BMI 30.42 kg/m   Physical Exam:  Well appearing 76 yo man, NAD HEENT: Unremarkable Neck:  6 cm JVD, no thyromegally Lymphatics:  No adenopathy Back:  No CVA tenderness Lungs:  Clear with no wheezes HEART:  Regular rate rhythm, no murmurs, no rubs, no clicks Abd:  soft, positive bowel sounds, no organomegally, no rebound, no guarding Ext:  2 plus pulses, no edema, no cyanosis, no clubbing Skin:  No rashes no nodules Neuro:  CN II through XII intact, motor grossly intact  DEVICE  Normal device function.  See PaceArt for details.   Assess/Plan: 1. Sinus node dysfunction - he is s/p  PPM and doing well.  2. PPM - his Medtronic DDD PM has over 9 years of battery longevity.  3. HTN - his blood pressure is well controlled. Will follow. 4. PAF - he is in NSR 97% of the time. No change in meds.  Mikle Bosworth.D.

## 2017-05-25 ENCOUNTER — Other Ambulatory Visit: Payer: Self-pay | Admitting: Internal Medicine

## 2017-07-01 ENCOUNTER — Ambulatory Visit (INDEPENDENT_AMBULATORY_CARE_PROVIDER_SITE_OTHER): Payer: Medicare Other | Admitting: Pharmacist

## 2017-07-01 DIAGNOSIS — Z7901 Long term (current) use of anticoagulants: Secondary | ICD-10-CM | POA: Diagnosis not present

## 2017-07-01 DIAGNOSIS — I4891 Unspecified atrial fibrillation: Secondary | ICD-10-CM

## 2017-07-01 DIAGNOSIS — Z5181 Encounter for therapeutic drug level monitoring: Secondary | ICD-10-CM | POA: Diagnosis not present

## 2017-07-01 LAB — POCT INR: INR: 1.9

## 2017-07-01 NOTE — Patient Instructions (Signed)
Description   Today take 1 tablet then Continue on same dosage 1/2 tablet daily except 1 tablet on Sundays and Thursdays. Continue 3 servings of greens a week. Recheck INR in 4 weeks.  Call with any questions [phone]

## 2017-07-03 DIAGNOSIS — X32XXXD Exposure to sunlight, subsequent encounter: Secondary | ICD-10-CM | POA: Diagnosis not present

## 2017-07-03 DIAGNOSIS — L820 Inflamed seborrheic keratosis: Secondary | ICD-10-CM | POA: Diagnosis not present

## 2017-07-03 DIAGNOSIS — L570 Actinic keratosis: Secondary | ICD-10-CM | POA: Diagnosis not present

## 2017-07-03 DIAGNOSIS — B078 Other viral warts: Secondary | ICD-10-CM | POA: Diagnosis not present

## 2017-07-03 DIAGNOSIS — B9689 Other specified bacterial agents as the cause of diseases classified elsewhere: Secondary | ICD-10-CM | POA: Diagnosis not present

## 2017-07-03 DIAGNOSIS — L218 Other seborrheic dermatitis: Secondary | ICD-10-CM | POA: Diagnosis not present

## 2017-07-03 DIAGNOSIS — L02821 Furuncle of head [any part, except face]: Secondary | ICD-10-CM | POA: Diagnosis not present

## 2017-07-13 ENCOUNTER — Other Ambulatory Visit: Payer: Self-pay

## 2017-07-13 MED ORDER — WARFARIN SODIUM 5 MG PO TABS: ORAL_TABLET | ORAL | 3 refills | Status: DC

## 2017-07-28 ENCOUNTER — Ambulatory Visit (INDEPENDENT_AMBULATORY_CARE_PROVIDER_SITE_OTHER): Payer: Medicare Other | Admitting: *Deleted

## 2017-07-28 DIAGNOSIS — Z7901 Long term (current) use of anticoagulants: Secondary | ICD-10-CM | POA: Diagnosis not present

## 2017-07-28 DIAGNOSIS — Z5181 Encounter for therapeutic drug level monitoring: Secondary | ICD-10-CM

## 2017-07-28 DIAGNOSIS — I4891 Unspecified atrial fibrillation: Secondary | ICD-10-CM | POA: Diagnosis not present

## 2017-07-28 LAB — POCT INR: INR: 1.9

## 2017-07-28 NOTE — Patient Instructions (Signed)
Description   Today take 1 tablet then start taking 1/2 tablet daily except 1 tablet on Sundays, Tuesdays, and Thursdays. Continue 3 servings of greens a week. Recheck INR in 2 weeks.  Call with any questions [phone]

## 2017-08-11 ENCOUNTER — Ambulatory Visit (INDEPENDENT_AMBULATORY_CARE_PROVIDER_SITE_OTHER): Payer: Medicare Other | Admitting: *Deleted

## 2017-08-11 DIAGNOSIS — I4891 Unspecified atrial fibrillation: Secondary | ICD-10-CM

## 2017-08-11 DIAGNOSIS — Z7901 Long term (current) use of anticoagulants: Secondary | ICD-10-CM | POA: Diagnosis not present

## 2017-08-11 DIAGNOSIS — Z5181 Encounter for therapeutic drug level monitoring: Secondary | ICD-10-CM

## 2017-08-11 LAB — POCT INR: INR: 2.5

## 2017-08-11 NOTE — Patient Instructions (Signed)
Description   Continue taking 1/2 tablet daily except 1 tablet on Sundays, Tuesdays, and Thursdays. Continue 3 servings of greens a week. Recheck INR in 3 weeks.  Call with any questions [phone]

## 2017-08-19 ENCOUNTER — Telehealth: Payer: Self-pay | Admitting: Cardiology

## 2017-08-19 ENCOUNTER — Ambulatory Visit (INDEPENDENT_AMBULATORY_CARE_PROVIDER_SITE_OTHER): Payer: Medicare Other | Admitting: *Deleted

## 2017-08-19 DIAGNOSIS — I495 Sick sinus syndrome: Secondary | ICD-10-CM | POA: Diagnosis not present

## 2017-08-19 NOTE — Telephone Encounter (Signed)
Spoke with pt and reminded pt of remote transmission that is due today. Pt verbalized understanding.   

## 2017-08-20 ENCOUNTER — Telehealth: Payer: Self-pay | Admitting: Internal Medicine

## 2017-08-20 NOTE — Telephone Encounter (Signed)
New Message       [facility] Pre-operative Risk Assessment    Request for surgical clearance:  1. What type of surgery is being performed? Dental extraction or crown  2. When is this surgery scheduled? TBD  3. What type of clearance is required (medical clearance vs. Pharmacy clearance to hold med vs. Both)? Both  4. Are there any medications that need to be held prior to surgery and how long? Coumadin....they will go based on our recommendation  5. Practice name and name of physician performing surgery? [facility] Dr. Quin Hoop    6. What is your office phone and fax number? Office number [phone] Fax number [phone]  7. Anesthesia type (None, local, MAC, general) ?local with epinephrine   Avaletta L Williams 08/20/2017, 12:17 PM  _________________________________________________________________   (provider comments below)

## 2017-08-21 NOTE — Progress Notes (Signed)
Remote pacemaker transmission.   

## 2017-08-24 ENCOUNTER — Encounter: Payer: Self-pay | Admitting: Cardiology

## 2017-08-24 NOTE — Telephone Encounter (Signed)
Very low risk procedure, pharmacist to review how long to hold coumadin

## 2017-08-25 NOTE — Telephone Encounter (Signed)
We recommend against holding warfarin for up to 3 extractions. If the patient needs to hold anticoagulation the dental office will need to contact our office for each scheduled procedure as clinical status may change.

## 2017-08-26 NOTE — Telephone Encounter (Signed)
   Primary Cardiologist: Cristopher Peru, MD  Chart reviewed as part of pre-operative protocol coverage. Given past medical history and time since last visit, based on ACC/AHA guidelines, GARVIN ELLENA would be at acceptable risk for the planned procedure without further cardiovascular testing. Per our pharmacy anticoagulation protocol, we recommend AGAINST holding warfarin for up to three tooth extractions.   I will route this recommendation to the requesting party via Epic fax function and remove from pre-op pool.  Please call with questions.  Murray Hodgkins, NP 08/26/2017, 4:29 PM

## 2017-08-28 LAB — CUP PACEART REMOTE DEVICE CHECK
Battery Impedance: 348 Ohm
Battery Voltage: 2.79 V
Brady Statistic AP VP Percent: 0 %
Brady Statistic AP VS Percent: 96 %
Brady Statistic AS VP Percent: 0 %
Brady Statistic AS VS Percent: 4 %
Date Time Interrogation Session: 20190330181919
Implantable Lead Implant Date: 20070112
Implantable Lead Location: 753859
Implantable Lead Location: 753860
Implantable Lead Model: 5076
Implantable Lead Model: 5076
Lead Channel Impedance Value: 639 Ohm
Lead Channel Pacing Threshold Amplitude: 0.5 V
Lead Channel Pacing Threshold Pulse Width: 0.4 ms
Lead Channel Pacing Threshold Pulse Width: 0.4 ms
Lead Channel Setting Pacing Amplitude: 2.5 V
Lead Channel Setting Pacing Pulse Width: 0.34 ms
MDC IDC LEAD IMPLANT DT: 20070112
MDC IDC MSMT BATTERY REMAINING LONGEVITY: 104 mo
MDC IDC MSMT LEADCHNL RA IMPEDANCE VALUE: 493 Ohm
MDC IDC MSMT LEADCHNL RV PACING THRESHOLD AMPLITUDE: 0.875 V
MDC IDC PG IMPLANT DT: 20131018
MDC IDC SET LEADCHNL RA PACING AMPLITUDE: 2 V
MDC IDC SET LEADCHNL RV SENSING SENSITIVITY: 4 mV

## 2017-09-01 ENCOUNTER — Ambulatory Visit (INDEPENDENT_AMBULATORY_CARE_PROVIDER_SITE_OTHER): Payer: Medicare Other

## 2017-09-01 DIAGNOSIS — I4891 Unspecified atrial fibrillation: Secondary | ICD-10-CM

## 2017-09-01 DIAGNOSIS — Z7901 Long term (current) use of anticoagulants: Secondary | ICD-10-CM | POA: Diagnosis not present

## 2017-09-01 DIAGNOSIS — Z5181 Encounter for therapeutic drug level monitoring: Secondary | ICD-10-CM

## 2017-09-01 LAB — POCT INR: INR: 3.5

## 2017-09-01 NOTE — Patient Instructions (Signed)
Description   Skip today's dosage of Coumadin, then start taking 1/2 tablet daily except 1 tablet on Sundays and Thursdays. Continue 3 servings of greens a week. Recheck INR in 3 weeks.  Call with any questions [phone]

## 2017-09-07 ENCOUNTER — Telehealth: Payer: Self-pay | Admitting: Internal Medicine

## 2017-09-07 NOTE — Telephone Encounter (Signed)
New message   Pt c/o medication issue:  1. Name of Medication: losartan (COZAAR) 50 MG tablet  2. How are you currently taking this medication (dosage and times per day)? As prescribed  3. Are you having a reaction (difficulty breathing--STAT)? NO  4. What is your medication issue? [employer] sent patient recall letter

## 2017-09-09 NOTE — Telephone Encounter (Signed)
Call placed to [facility]. Per pharmacist, if patient receives letter notifying them their losartan is bad they should bring the letter with their medication to [facility] to have it replaced with a different lot #.  Call placed to wife - per DPR. Notified to bring letter with medication to [facility] for replacement.  Wife indicates understanding.

## 2017-09-10 ENCOUNTER — Other Ambulatory Visit: Payer: Self-pay | Admitting: Internal Medicine

## 2017-09-29 ENCOUNTER — Ambulatory Visit (INDEPENDENT_AMBULATORY_CARE_PROVIDER_SITE_OTHER): Payer: Medicare Other | Admitting: *Deleted

## 2017-09-29 ENCOUNTER — Encounter (INDEPENDENT_AMBULATORY_CARE_PROVIDER_SITE_OTHER): Payer: Self-pay

## 2017-09-29 DIAGNOSIS — I4891 Unspecified atrial fibrillation: Secondary | ICD-10-CM

## 2017-09-29 DIAGNOSIS — Z7901 Long term (current) use of anticoagulants: Secondary | ICD-10-CM | POA: Diagnosis not present

## 2017-09-29 DIAGNOSIS — Z5181 Encounter for therapeutic drug level monitoring: Secondary | ICD-10-CM

## 2017-09-29 LAB — POCT INR: INR: 2.1

## 2017-09-29 NOTE — Patient Instructions (Signed)
Description   Continue  taking 1/2 tablet daily except 1 tablet on Sundays and Thursdays. Continue 3 servings of greens a week. Recheck INR in  4 weeks.  Call with any questions [phone]

## 2017-10-06 DIAGNOSIS — F332 Major depressive disorder, recurrent severe without psychotic features: Secondary | ICD-10-CM | POA: Diagnosis not present

## 2017-10-27 ENCOUNTER — Ambulatory Visit (INDEPENDENT_AMBULATORY_CARE_PROVIDER_SITE_OTHER): Payer: Medicare Other | Admitting: *Deleted

## 2017-10-27 DIAGNOSIS — Z7901 Long term (current) use of anticoagulants: Secondary | ICD-10-CM | POA: Diagnosis not present

## 2017-10-27 DIAGNOSIS — I4891 Unspecified atrial fibrillation: Secondary | ICD-10-CM

## 2017-10-27 DIAGNOSIS — Z5181 Encounter for therapeutic drug level monitoring: Secondary | ICD-10-CM

## 2017-10-27 LAB — POCT INR: INR: 2.7 (ref 2.0–3.0)

## 2017-10-27 NOTE — Patient Instructions (Signed)
Description   Continue taking 1/2 tablet daily except 1 tablet on Sundays and Thursdays. Continue 3 servings of greens a week. Recheck INR in 4 weeks.  Call with any questions [phone]

## 2017-11-18 ENCOUNTER — Ambulatory Visit (INDEPENDENT_AMBULATORY_CARE_PROVIDER_SITE_OTHER): Payer: Medicare Other | Admitting: *Deleted

## 2017-11-18 ENCOUNTER — Telehealth: Payer: Self-pay

## 2017-11-18 DIAGNOSIS — I495 Sick sinus syndrome: Secondary | ICD-10-CM

## 2017-11-18 NOTE — Telephone Encounter (Signed)
Confirmed remote transmission w/ pt wife.   

## 2017-11-19 ENCOUNTER — Encounter: Payer: Self-pay | Admitting: Cardiology

## 2017-11-19 LAB — CUP PACEART REMOTE DEVICE CHECK
Battery Impedance: 373 Ohm
Battery Remaining Longevity: 101 mo
Battery Voltage: 2.8 V
Brady Statistic AP VP Percent: 0 %
Brady Statistic AS VP Percent: 0 %
Brady Statistic AS VS Percent: 4 %
Implantable Lead Implant Date: 20070112
Implantable Lead Model: 5076
Implantable Lead Model: 5076
Implantable Pulse Generator Implant Date: 20131018
Lead Channel Impedance Value: 479 Ohm
Lead Channel Pacing Threshold Amplitude: 0.5 V
Lead Channel Setting Pacing Amplitude: 2 V
Lead Channel Setting Pacing Amplitude: 2.5 V
Lead Channel Setting Pacing Pulse Width: 0.34 ms
Lead Channel Setting Sensing Sensitivity: 2.8 mV
MDC IDC LEAD IMPLANT DT: 20070112
MDC IDC LEAD LOCATION: 753859
MDC IDC LEAD LOCATION: 753860
MDC IDC MSMT LEADCHNL RA PACING THRESHOLD PULSEWIDTH: 0.4 ms
MDC IDC MSMT LEADCHNL RV IMPEDANCE VALUE: 544 Ohm
MDC IDC MSMT LEADCHNL RV PACING THRESHOLD AMPLITUDE: 0.875 V
MDC IDC MSMT LEADCHNL RV PACING THRESHOLD PULSEWIDTH: 0.4 ms
MDC IDC SESS DTM: 20190627000302
MDC IDC STAT BRADY AP VS PERCENT: 96 %

## 2017-11-19 NOTE — Progress Notes (Signed)
Remote pacemaker transmission.   

## 2017-11-25 DIAGNOSIS — R972 Elevated prostate specific antigen [PSA]: Secondary | ICD-10-CM | POA: Diagnosis not present

## 2017-11-30 ENCOUNTER — Ambulatory Visit (INDEPENDENT_AMBULATORY_CARE_PROVIDER_SITE_OTHER): Payer: Medicare Other | Admitting: *Deleted

## 2017-11-30 DIAGNOSIS — Z5181 Encounter for therapeutic drug level monitoring: Secondary | ICD-10-CM | POA: Diagnosis not present

## 2017-11-30 DIAGNOSIS — Z7901 Long term (current) use of anticoagulants: Secondary | ICD-10-CM | POA: Diagnosis not present

## 2017-11-30 DIAGNOSIS — I4891 Unspecified atrial fibrillation: Secondary | ICD-10-CM

## 2017-11-30 LAB — POCT INR: INR: 3.2 — AB (ref 2.0–3.0)

## 2017-11-30 NOTE — Patient Instructions (Signed)
Description   Hold today's dose then continue taking 1/2 tablet daily except 1 tablet on Sundays and Thursdays. Continue 3 servings of greens a week. Recheck INR in 4 weeks.  Call with any questions [phone]

## 2017-12-01 DIAGNOSIS — R3911 Hesitancy of micturition: Secondary | ICD-10-CM | POA: Diagnosis not present

## 2017-12-01 DIAGNOSIS — N401 Enlarged prostate with lower urinary tract symptoms: Secondary | ICD-10-CM | POA: Diagnosis not present

## 2017-12-28 ENCOUNTER — Ambulatory Visit (INDEPENDENT_AMBULATORY_CARE_PROVIDER_SITE_OTHER): Payer: Medicare Other | Admitting: *Deleted

## 2017-12-28 DIAGNOSIS — Z5181 Encounter for therapeutic drug level monitoring: Secondary | ICD-10-CM | POA: Diagnosis not present

## 2017-12-28 DIAGNOSIS — Z7901 Long term (current) use of anticoagulants: Secondary | ICD-10-CM

## 2017-12-28 DIAGNOSIS — I4891 Unspecified atrial fibrillation: Secondary | ICD-10-CM | POA: Diagnosis not present

## 2017-12-28 LAB — POCT INR: INR: 2 (ref 2.0–3.0)

## 2017-12-28 NOTE — Patient Instructions (Signed)
Description   Today take 1 tablet then continue taking 1/2 tablet daily except 1 tablet on Sundays and Thursdays. Continue 3 servings of greens a week. Recheck INR in 4 weeks.  Call with any questions [phone]

## 2018-01-01 DIAGNOSIS — L820 Inflamed seborrheic keratosis: Secondary | ICD-10-CM | POA: Diagnosis not present

## 2018-01-01 DIAGNOSIS — Z1283 Encounter for screening for malignant neoplasm of skin: Secondary | ICD-10-CM | POA: Diagnosis not present

## 2018-01-01 DIAGNOSIS — D225 Melanocytic nevi of trunk: Secondary | ICD-10-CM | POA: Diagnosis not present

## 2018-01-26 ENCOUNTER — Other Ambulatory Visit: Payer: Self-pay | Admitting: Internal Medicine

## 2018-01-27 ENCOUNTER — Ambulatory Visit (INDEPENDENT_AMBULATORY_CARE_PROVIDER_SITE_OTHER): Payer: Medicare Other | Admitting: Pharmacist

## 2018-01-27 DIAGNOSIS — Z7901 Long term (current) use of anticoagulants: Secondary | ICD-10-CM

## 2018-01-27 DIAGNOSIS — I4891 Unspecified atrial fibrillation: Secondary | ICD-10-CM | POA: Diagnosis not present

## 2018-01-27 DIAGNOSIS — Z5181 Encounter for therapeutic drug level monitoring: Secondary | ICD-10-CM | POA: Diagnosis not present

## 2018-01-27 LAB — POCT INR: INR: 1.6 — AB (ref 2.0–3.0)

## 2018-01-27 NOTE — Patient Instructions (Signed)
Description   Today take 1 tablet then continue taking 1/2 tablet daily except 1 tablet on Sundays and Thursdays. Continue 3 servings of greens a week. Recheck INR in 2 weeks.  Call with any questions [phone]

## 2018-02-11 ENCOUNTER — Ambulatory Visit (INDEPENDENT_AMBULATORY_CARE_PROVIDER_SITE_OTHER): Payer: Medicare Other | Admitting: *Deleted

## 2018-02-11 DIAGNOSIS — I4891 Unspecified atrial fibrillation: Secondary | ICD-10-CM

## 2018-02-11 DIAGNOSIS — Z5181 Encounter for therapeutic drug level monitoring: Secondary | ICD-10-CM | POA: Diagnosis not present

## 2018-02-11 DIAGNOSIS — Z7901 Long term (current) use of anticoagulants: Secondary | ICD-10-CM

## 2018-02-11 LAB — POCT INR: INR: 2.7 (ref 2.0–3.0)

## 2018-02-11 NOTE — Patient Instructions (Signed)
Description   Continue taking 1/2 tablet daily except 1 tablet on Sundays and Thursdays. Continue 3 servings of greens a week. Recheck INR in 3 weeks.  Call with any questions [phone]

## 2018-02-17 ENCOUNTER — Telehealth: Payer: Self-pay

## 2018-02-17 ENCOUNTER — Ambulatory Visit (INDEPENDENT_AMBULATORY_CARE_PROVIDER_SITE_OTHER): Payer: Medicare Other | Admitting: *Deleted

## 2018-02-17 DIAGNOSIS — I495 Sick sinus syndrome: Secondary | ICD-10-CM

## 2018-02-17 NOTE — Telephone Encounter (Signed)
Confirmed remote transmission w/ pt wife.   

## 2018-02-18 ENCOUNTER — Encounter: Payer: Self-pay | Admitting: Cardiology

## 2018-02-18 DIAGNOSIS — Z96652 Presence of left artificial knee joint: Secondary | ICD-10-CM | POA: Diagnosis not present

## 2018-02-18 DIAGNOSIS — M25552 Pain in left hip: Secondary | ICD-10-CM | POA: Diagnosis not present

## 2018-02-18 DIAGNOSIS — Z09 Encounter for follow-up examination after completed treatment for conditions other than malignant neoplasm: Secondary | ICD-10-CM | POA: Diagnosis not present

## 2018-02-18 DIAGNOSIS — M25562 Pain in left knee: Secondary | ICD-10-CM | POA: Diagnosis not present

## 2018-02-18 NOTE — Progress Notes (Signed)
Remote pacemaker transmission.   

## 2018-03-04 ENCOUNTER — Ambulatory Visit (INDEPENDENT_AMBULATORY_CARE_PROVIDER_SITE_OTHER): Payer: Medicare Other | Admitting: *Deleted

## 2018-03-04 DIAGNOSIS — I4891 Unspecified atrial fibrillation: Secondary | ICD-10-CM | POA: Diagnosis not present

## 2018-03-04 DIAGNOSIS — Z5181 Encounter for therapeutic drug level monitoring: Secondary | ICD-10-CM | POA: Diagnosis not present

## 2018-03-04 DIAGNOSIS — Z7901 Long term (current) use of anticoagulants: Secondary | ICD-10-CM

## 2018-03-04 LAB — POCT INR: INR: 2.6 (ref 2.0–3.0)

## 2018-03-04 NOTE — Patient Instructions (Signed)
Description   Continue taking 1/2 tablet daily except 1 tablet on Sundays and Thursdays. Continue 3 servings of greens a week. Recheck INR in 5 weeks.  Call with any questions [phone]

## 2018-03-09 DIAGNOSIS — Z23 Encounter for immunization: Secondary | ICD-10-CM | POA: Diagnosis not present

## 2018-03-15 DIAGNOSIS — L820 Inflamed seborrheic keratosis: Secondary | ICD-10-CM | POA: Diagnosis not present

## 2018-03-30 LAB — CUP PACEART REMOTE DEVICE CHECK
Battery Impedance: 397 Ohm
Brady Statistic AP VS Percent: 95 %
Brady Statistic AS VP Percent: 0 %
Brady Statistic AS VS Percent: 5 %
Date Time Interrogation Session: 20190925235124
Implantable Lead Implant Date: 20070112
Implantable Lead Location: 753859
Implantable Lead Model: 5076
Lead Channel Impedance Value: 580 Ohm
Lead Channel Pacing Threshold Amplitude: 0.5 V
Lead Channel Pacing Threshold Amplitude: 0.875 V
Lead Channel Pacing Threshold Pulse Width: 0.4 ms
Lead Channel Pacing Threshold Pulse Width: 0.4 ms
MDC IDC LEAD IMPLANT DT: 20070112
MDC IDC LEAD LOCATION: 753860
MDC IDC MSMT BATTERY REMAINING LONGEVITY: 99 mo
MDC IDC MSMT BATTERY VOLTAGE: 2.8 V
MDC IDC MSMT LEADCHNL RA IMPEDANCE VALUE: 465 Ohm
MDC IDC PG IMPLANT DT: 20131018
MDC IDC SET LEADCHNL RA PACING AMPLITUDE: 2 V
MDC IDC SET LEADCHNL RV PACING AMPLITUDE: 2.5 V
MDC IDC SET LEADCHNL RV PACING PULSEWIDTH: 0.34 ms
MDC IDC SET LEADCHNL RV SENSING SENSITIVITY: 2.8 mV
MDC IDC STAT BRADY AP VP PERCENT: 0 %

## 2018-04-08 ENCOUNTER — Ambulatory Visit (INDEPENDENT_AMBULATORY_CARE_PROVIDER_SITE_OTHER): Payer: Medicare Other | Admitting: Pharmacist

## 2018-04-08 DIAGNOSIS — Z5181 Encounter for therapeutic drug level monitoring: Secondary | ICD-10-CM | POA: Diagnosis not present

## 2018-04-08 DIAGNOSIS — K219 Gastro-esophageal reflux disease without esophagitis: Secondary | ICD-10-CM | POA: Diagnosis not present

## 2018-04-08 DIAGNOSIS — I4891 Unspecified atrial fibrillation: Secondary | ICD-10-CM

## 2018-04-08 DIAGNOSIS — F320 Major depressive disorder, single episode, mild: Secondary | ICD-10-CM | POA: Diagnosis not present

## 2018-04-08 DIAGNOSIS — M1990 Unspecified osteoarthritis, unspecified site: Secondary | ICD-10-CM | POA: Diagnosis not present

## 2018-04-08 DIAGNOSIS — Z950 Presence of cardiac pacemaker: Secondary | ICD-10-CM | POA: Diagnosis not present

## 2018-04-08 DIAGNOSIS — Z7901 Long term (current) use of anticoagulants: Secondary | ICD-10-CM

## 2018-04-08 DIAGNOSIS — Z0000 Encounter for general adult medical examination without abnormal findings: Secondary | ICD-10-CM | POA: Diagnosis not present

## 2018-04-08 DIAGNOSIS — E7800 Pure hypercholesterolemia, unspecified: Secondary | ICD-10-CM | POA: Diagnosis not present

## 2018-04-08 DIAGNOSIS — J309 Allergic rhinitis, unspecified: Secondary | ICD-10-CM | POA: Diagnosis not present

## 2018-04-08 DIAGNOSIS — H9319 Tinnitus, unspecified ear: Secondary | ICD-10-CM | POA: Diagnosis not present

## 2018-04-08 DIAGNOSIS — G43909 Migraine, unspecified, not intractable, without status migrainosus: Secondary | ICD-10-CM | POA: Diagnosis not present

## 2018-04-08 DIAGNOSIS — I10 Essential (primary) hypertension: Secondary | ICD-10-CM | POA: Diagnosis not present

## 2018-04-08 LAB — POCT INR: INR: 2.1 (ref 2.0–3.0)

## 2018-04-08 NOTE — Patient Instructions (Signed)
Description   Continue taking 1/2 tablet daily except 1 tablet on Sundays and Thursdays. Continue 3 servings of greens a week. Recheck INR in 6 weeks.  Call with any questions [phone]

## 2018-04-28 ENCOUNTER — Other Ambulatory Visit: Payer: Self-pay | Admitting: Internal Medicine

## 2018-04-29 ENCOUNTER — Emergency Department (HOSPITAL_COMMUNITY): Payer: Medicare Other

## 2018-04-29 ENCOUNTER — Encounter (HOSPITAL_COMMUNITY): Payer: Self-pay

## 2018-04-29 ENCOUNTER — Emergency Department (HOSPITAL_COMMUNITY)
Admission: EM | Admit: 2018-04-29 | Discharge: 2018-04-29 | Disposition: A | Discharge status: Home / Self Care | Payer: Medicare Other | Attending: Emergency Medicine | Admitting: Emergency Medicine

## 2018-04-29 ENCOUNTER — Other Ambulatory Visit: Payer: Self-pay

## 2018-04-29 DIAGNOSIS — Z79899 Other long term (current) drug therapy: Secondary | ICD-10-CM | POA: Diagnosis not present

## 2018-04-29 DIAGNOSIS — S0990XA Unspecified injury of head, initial encounter: Secondary | ICD-10-CM | POA: Diagnosis not present

## 2018-04-29 DIAGNOSIS — R55 Syncope and collapse: Secondary | ICD-10-CM | POA: Insufficient documentation

## 2018-04-29 DIAGNOSIS — S79911A Unspecified injury of right hip, initial encounter: Secondary | ICD-10-CM | POA: Diagnosis not present

## 2018-04-29 DIAGNOSIS — M25551 Pain in right hip: Secondary | ICD-10-CM | POA: Diagnosis not present

## 2018-04-29 DIAGNOSIS — R0602 Shortness of breath: Secondary | ICD-10-CM | POA: Diagnosis not present

## 2018-04-29 DIAGNOSIS — R51 Headache: Secondary | ICD-10-CM | POA: Diagnosis not present

## 2018-04-29 LAB — BASIC METABOLIC PANEL
Anion gap: 7 (ref 5–15)
BUN: 14 mg/dL (ref 8–23)
CALCIUM: 8.8 mg/dL — AB (ref 8.9–10.3)
CO2: 30 mmol/L (ref 22–32)
Chloride: 104 mmol/L (ref 98–111)
Creatinine, Ser: 0.98 mg/dL (ref 0.61–1.24)
GFR calc non Af Amer: >60 mL/min (ref >60)
Glucose, Bld: 98 mg/dL (ref 70–99)
Potassium: 4.6 mmol/L (ref 3.5–5.1)
SODIUM: 141 mmol/L (ref 135–145)

## 2018-04-29 LAB — GLUCOSE, CAPILLARY: Glucose-Capillary: 84 mg/dL (ref 70–99)

## 2018-04-29 LAB — CBC
HCT: 40.6 % (ref 39.0–52.0)
Hemoglobin: 13.3 g/dL (ref 13.0–17.0)
MCH: 29.8 pg (ref 26.0–34.0)
MCHC: 32.8 g/dL (ref 30.0–36.0)
MCV: 91 fL (ref 80.0–100.0)
NRBC: 0 % (ref 0.0–0.2)
Platelets: 174 10*3/uL (ref 150–400)
RBC: 4.46 MIL/uL (ref 4.22–5.81)
RDW: 12.7 % (ref 11.5–15.5)
WBC: 5.6 10*3/uL (ref 4.0–10.5)

## 2018-04-29 LAB — URINALYSIS, ROUTINE W REFLEX MICROSCOPIC
Bilirubin Urine: NEGATIVE
Glucose, UA: NEGATIVE mg/dL
Hgb urine dipstick: NEGATIVE
Ketones, ur: NEGATIVE mg/dL
Leukocytes, UA: NEGATIVE
NITRITE: NEGATIVE
Protein, ur: NEGATIVE mg/dL
Specific Gravity, Urine: 1.011 (ref 1.005–1.030)
pH: 5 (ref 5.0–8.0)

## 2018-04-29 LAB — PROTIME-INR
INR: 3.13
PROTHROMBIN TIME: 31.7 s — AB (ref 11.4–15.2)

## 2018-04-29 MED ORDER — ACETAMINOPHEN 325 MG PO TABS
650.0000 mg | ORAL_TABLET | Freq: Once | ORAL | Status: AC
  Administered 2018-04-29: 650 mg via ORAL
  Filled 2018-04-29: qty 2

## 2018-04-29 NOTE — ED Notes (Signed)
Pt ambulated in hallway with no difficulty

## 2018-04-29 NOTE — ED Provider Notes (Signed)
[place] DEPT Provider Note   CSN: [id number] Arrival date & time: 04/29/18  0940     History   Chief Complaint Chief Complaint  Patient presents with  . Loss of Consciousness    HPI [name] [name] is a 77 y.o. male.  He has a history of pacemaker A. fib on Coumadin.  He said he got up out of bed this morning and went to the closet to get his robe and he feels like he passed out for a few seconds.  He said he fell into the door and hit his shoulders and right hip.  He is not sure if he hit his head.  He noticed that he has a little bit of a frontal headache that he sometimes gets when he is in A. fib.  No chest pain or shortness of breath.  He said he is been trying to lose weight and is been eating less.  No prior history of syncope.  No recent medication changes.  He was having difficulty ambulating due to feeling dizzy/lightheaded and needed to use a walker to get to the car with assistance from his wife and son.  The history is provided by the patient and the spouse.  Loss of Consciousness   This is a new problem. The current episode started 1 to 2 hours ago. The problem has been resolved. Length of episode of loss of consciousness: seconds. The problem is associated with normal activity. Associated symptoms include dizziness, headaches and light-headedness. Pertinent negatives include abdominal pain, back pain, bowel incontinence, chest pain, fever, focal weakness, nausea, seizures, slurred speech, vertigo, vomiting and weakness. He has tried bed rest for the symptoms. The treatment provided moderate relief.    Past Medical History:  Diagnosis Date  . Anemia    since post op- knee replacement   . Anxiety    takes xanax 2 times per day, taken mostly for ringing in ears    . Arthritis of knee, right    end-stage  . Bronchitis   . Bursitis   . Complication of anesthesia    irritation in throat postop, makes a-fib worse   . Depression   . DJD  (degenerative joint disease)    OA- "everywhere"  . Family history of anesthesia complication    sister with nausia and vomiting  . Gout   . Heart murmur   . HTN (hypertension)   . Hyperlipidemia   . Migraines   . Paroxysmal atrial fibrillation (HCC)   . PONV (postoperative nausea and vomiting)   . Tinnitus     Patient Active Problem List   Diagnosis Date Noted  . Major depressive disorder, recurrent episode, severe ([place]) 03/26/2013  . Suicidal ideation 03/26/2013  . Pain due to total left knee replacement ([place]) 08/09/2011  . Long term current use of anticoagulant 07/15/2010  . PPM-Medtronic 06/05/2009  . HEADACHE 04/11/2009  . HYPERTENSION, BENIGN 04/19/2008  . ATRIAL FIBRILLATION 04/19/2008    Past Surgical History:  Procedure Laterality Date  . APPENDECTOMY    . Atrial fibrillation ablation  2009, 12/02/11   PVI by Dr Rayann Heman x 2  . ATRIAL FIBRILLATION ABLATION N/A 12/02/2011   Procedure: ATRIAL FIBRILLATION ABLATION;  Surgeon: Thompson Grayer, MD;  Location: [facility] CATH LAB;  Service: Cardiovascular;  Laterality: N/A;  . bilteral thumb joint surgrery    . CARDIAC CATHETERIZATION  2005    which revealed 40% stenosis of the mid left anterior descending artery., ablation- 2009  . CHOLECYSTECTOMY    .  JOINT REPLACEMENT     L knee- 12/11,R hip- 1996  . left rotator cuff repair    . PACEMAKER INSERTION  06/06/2005   Medtronic EnRhythm dual-chamber. For sick sinus syndrome  . PERMANENT PACEMAKER GENERATOR CHANGE N/A 03/12/2012   Procedure: PERMANENT PACEMAKER GENERATOR CHANGE;  Surgeon: Evans Lance, MD;  Location: [facility] CATH LAB;  Service: Cardiovascular;  Laterality: N/A;  . RHINOPLASTY    . right rotator cuff surgery  2006  . right total hip arthroplasty    . TEE WITHOUT CARDIOVERSION  12/01/2011   Procedure: TRANSESOPHAGEAL ECHOCARDIOGRAM (TEE);  Surgeon: Fay Records, MD;  Location: [facility] ENDOSCOPY;  Service: Cardiovascular;  Laterality: N/A;  a-fib ablation following day  .  TONSILLECTOMY     as a child  . TOTAL KNEE REVISION  08/08/2011   Procedure: TOTAL KNEE REVISION;  Surgeon: Kerin Salen, MD;  Location: [place];  Service: Orthopedics;  Laterality: Left;  DEPUY/SIGMA  . TRANSESOPHAGEAL ECHOCARDIOGRAM  04/2008  . TRANSTHORACIC ECHOCARDIOGRAM  2006,2009  . uvulectomy          Home Medications    Prior to Admission medications   Medication Sig Start Date End Date Taking? Authorizing Provider  ALPRAZolam Duanne Moron) 0.5 MG tablet Take 1 tablet (0.5 mg total) by mouth 3 (three) times daily as needed for anxiety. 03/28/13   Ruben Im, PA-C  ferrous sulfate 325 (65 FE) MG tablet Take 1 tablet (325 mg total) by mouth daily with breakfast. For low iron. 03/28/13   Ruben Im, PA-C  FLUoxetine (PROZAC) 40 MG capsule Take one tablet each day for depression. (40mg  ) Patient taking differently: Take one tablet by mouth each day for depression. (40mg  ) 03/28/13   Mashburn, Marlane Hatcher, PA-C  losartan (COZAAR) 50 MG tablet Take 1 tablet (50 mg total) by mouth daily. 09/10/17   Evans Lance, MD  meloxicam (MOBIC) 15 MG tablet Take 15 mg by mouth daily.    [provider]  potassium chloride SA (K-DUR,KLOR-CON) 20 MEQ tablet Take one tablet two times a day for low potassium. Patient taking differently: Take one tablet by mouth two times a day for low potassium. 03/28/13   Ruben Im, PA-C  propranolol (INDERAL) 20 MG tablet TAKE ONE TABLET BY MOUTH THREE TIMES DAILY 05/25/17   Evans Lance, MD  tamsulosin (FLOMAX) 0.4 MG CAPS capsule Take 0.4 mg by mouth daily.     [provider]  warfarin (COUMADIN) 5 MG tablet TAKE BY MOUTH AS DIRECTED BY ANTICOAGULATION CLINIC 01/26/18   Evans Lance, MD  warfarin (COUMADIN) 5 MG tablet TAKE BY MOUTH AS DIRECTED BY ANTICOAGULATION CLINIC 04/28/18   Evans Lance, MD    Family History Family History  Problem Relation Age of Onset  . Coronary artery disease Mother   . Cerebrovascular Disease Mother     . Cancer Other   . Anesthesia problems Neg Hx   . Hypotension Neg Hx   . Malignant hyperthermia Neg Hx   . Pseudochol deficiency Neg Hx     Social History Social History   Tobacco Use  . Smoking status: Never Smoker  . Smokeless tobacco: Never Used  Substance Use Topics  . Alcohol use: Yes    Comment: rare use  . Drug use: No     Allergies   Iohexol; Meperidine hcl; and Prednisone   Review of Systems Review of Systems  Constitutional: Negative for fever.  HENT: Negative for sore throat.  Eyes: Negative for visual disturbance.  Respiratory: Negative for shortness of breath.   Cardiovascular: Positive for syncope. Negative for chest pain.  Gastrointestinal: Negative for abdominal pain, bowel incontinence, nausea and vomiting.  Genitourinary: Negative for dysuria.  Musculoskeletal: Negative for back pain and neck pain.  Skin: Negative for rash.  Neurological: Positive for dizziness, syncope, light-headedness and headaches. Negative for vertigo, focal weakness, seizures and weakness.     Physical Exam Updated Vital Signs BP (!) 144/89 (BP Location: Left Arm)   Pulse 61   Temp (!) 97.5 F (36.4 C) (Oral)   Resp 16   Ht 5\' 7"  (1.702 m)   Wt 83 kg   SpO2 99%   BMI 28.66 kg/m   Physical Exam  Constitutional: He appears well-developed and well-nourished.  HENT:  Head: Normocephalic and atraumatic.  Eyes: Conjunctivae are normal.  Neck: Neck supple.  Cardiovascular: Normal rate, regular rhythm and normal heart sounds.  No murmur heard. Pulmonary/Chest: Effort normal and breath sounds normal. No respiratory distress.  Abdominal: Soft. There is no tenderness.  Musculoskeletal: Normal range of motion. He exhibits tenderness. He exhibits no edema or deformity.  Full range of motion of both of his shoulders without any limitations.  Normal landmarks.  Mild diffuse pain with movement.  Full range of motion of his hip and knee on the right with some generalized  soreness on the anterior thigh.  Neurological: He is alert. He has normal strength. No sensory deficit. GCS eye subscore is 4. GCS verbal subscore is 5. GCS motor subscore is 6.  Skin: Skin is warm and dry. Capillary refill takes less than 2 seconds.  Psychiatric: He has a normal mood and affect.  Nursing note and vitals reviewed.    ED Treatments / Results  Labs (all labs ordered are listed, but only abnormal results are displayed) Labs Reviewed  BASIC METABOLIC PANEL - Abnormal; Notable for the following components:      Result Value   Calcium 8.8 (*)    All other components within normal limits  PROTIME-INR - Abnormal; Notable for the following components:   Prothrombin Time 31.7 (*)    All other components within normal limits  CBC  URINALYSIS, ROUTINE W REFLEX MICROSCOPIC  GLUCOSE, CAPILLARY  CBG MONITORING, ED    EKG EKG Interpretation  Date/Time:  Thursday April 29 2018 10:09:45 EST Ventricular Rate:  62 PR Interval:    QRS Duration: 93 QT Interval:  387 QTC Calculation: 393 R Axis:   2 Text Interpretation:  Atrial-paced rhythm similar to prior 11/14 Confirmed by Aletta Edouard [phone]) on 04/29/2018 10:26:22 AM   Radiology Dg Chest 2 View  Result Date: 04/29/2018 CLINICAL DATA:  Syncope, atrial fibrillation, dizziness and shortness of breath EXAM: CHEST - 2 VIEW COMPARISON:  Chest x-ray of 10/07/2011 FINDINGS: Mild linear scarring remains in the right mid lung probably involving the anterior inferior aspect of the right upper lobe. Slight elevation of the right hemidiaphragm is stable. No pneumonia or pleural effusion is seen. Permanent pacemaker remains. Cardiomegaly is stable. No acute bony abnormality is seen. IMPRESSION: 1. No pneumonia or effusion. Chronic elevation of the right hemidiaphragm. 2. Permanent pacemaker remains. Electronically Signed   By: Ivar Drape M.D.   On: 04/29/2018 10:38   Ct Head Wo Contrast  Result Date: 04/29/2018 CLINICAL DATA:   77 year old male status post syncope upon waking this morning with fall. Headache. Atrial fibrillation on warfarin. EXAM: CT HEAD WITHOUT CONTRAST TECHNIQUE: Contiguous axial images were obtained from the  base of the skull through the vertex without intravenous contrast. COMPARISON:  Head CT 03/27/2013 and earlier. FINDINGS: Brain: Well-defined hypodensity in the left basal ganglia is new since 2014 on series 2, image 14. This has a chronic appearance on coronal image 27. Elsewhere gray-white matter differentiation is stable and normal for age. No midline shift, ventriculomegaly, mass effect, evidence of mass lesion, intracranial hemorrhage or evidence of cortically based acute infarction. Gray-white matter differentiation is within normal limits throughout the brain. Vascular: Calcified atherosclerosis at the skull base. No suspicious intracranial vascular hyperdensity. Skull: Stable, negative. Sinuses/Orbits: Mild new frontal and ethmoid sinus mucosal thickening plus trace bubbly opacity in the left sphenoid. Chronic mild maxillary mucosal thickening. Tympanic cavities and mastoids remain clear. Other: Visualized scalp soft tissues are within normal limits. Visualized orbit soft tissues are within normal limits. IMPRESSION: 1. Age indeterminate but probably chronic lacunar infarct in the left basal ganglia, new since 2014. 2. Otherwise stable and normal for age noncontrast CT appearance of the brain. 3. Mild paranasal sinus inflammation. Electronically Signed   By: Genevie Ann M.D.   On: 04/29/2018 12:09   Dg Hip Unilat With Pelvis 2-3 Views Right  Result Date: 04/29/2018 CLINICAL DATA:  Right hip pain after fall today. EXAM: DG HIP (WITH OR WITHOUT PELVIS) 2-3V RIGHT COMPARISON:  None. FINDINGS: Status post right hip arthroplasty. The femoral and acetabular components appear to be well situated. No fracture or dislocation is noted. Normal left hip. IMPRESSION: Status post right hip arthroplasty.  No acute  abnormality is noted. Electronically Signed   By: Marijo Conception, M.D.   On: 04/29/2018 11:52    Procedures Procedures (including critical care time)  Medications Ordered in ED Medications - No data to display   Initial Impression / Assessment and Plan / ED Course  I have reviewed the triage vital signs and the nursing notes.  Pertinent labs & imaging results that were available during my care of the patient were reviewed by me and considered in my medical decision making (see chart for details).  Clinical Course as of Apr 29 1736  Thu Apr 29, 2018  1227 Received a call from the [place] rep who interrogated the pacemaker.  He said he has many high atrial rate events the last one being 2 days ago but nothing happened this morning.   [MB]  1411 Patient was able to get up and ambulate.  He says he feels sore but otherwise feels well enough to go home.   [MB]    Clinical Course User Index [MB] Hayden Rasmussen, MD    Final Clinical Impressions(s) / ED Diagnoses   Final diagnoses:  Syncope and collapse    ED Discharge Orders    None       Hayden Rasmussen, MD 04/29/18 [id number]

## 2018-04-29 NOTE — Discharge Instructions (Signed)
You were evaluated in the department for a fainting spell in which you fell in the closet and injured your shoulder and thigh.  You had blood work EKG and x-rays and a CAT scan.  The CAT scan showed signs of an old stroke and this will need to be followed up with your primary care doctor.  Your pacemaker was interrogated and they did not find any event to explain your symptoms today.  Will be important for you to follow-up with your primary care doctor and your cardiologist.  Please return if any concerns.

## 2018-04-29 NOTE — ED Notes (Signed)
Unable to interrogate pacemaker at this time, Medtronic iPad updating.  Unable to use medtronic app

## 2018-04-29 NOTE — ED Triage Notes (Signed)
Pt states that this morning he woke up, got up quickly, and "passed out", hitting the door . Pt states he is unsure if he hit with his right or leg arm, and pt states he has pain in his right hip. Pt states that he has pain in his right upper leg , from "the knee up". Pt states he does not believe he hit his head, but does have a headache. Wife states that pt's a fib sometimes causes this headache. Pt states that he takes warfarin.

## 2018-04-29 NOTE — ED Notes (Signed)
Patient to xray.

## 2018-05-05 DIAGNOSIS — R55 Syncope and collapse: Secondary | ICD-10-CM | POA: Diagnosis not present

## 2018-05-15 ENCOUNTER — Other Ambulatory Visit: Payer: Self-pay | Admitting: Internal Medicine

## 2018-05-20 ENCOUNTER — Ambulatory Visit (INDEPENDENT_AMBULATORY_CARE_PROVIDER_SITE_OTHER): Payer: Medicare Other | Admitting: *Deleted

## 2018-05-20 ENCOUNTER — Ambulatory Visit (INDEPENDENT_AMBULATORY_CARE_PROVIDER_SITE_OTHER): Payer: Medicare Other

## 2018-05-20 DIAGNOSIS — I495 Sick sinus syndrome: Secondary | ICD-10-CM

## 2018-05-20 DIAGNOSIS — I4891 Unspecified atrial fibrillation: Secondary | ICD-10-CM

## 2018-05-20 DIAGNOSIS — Z7901 Long term (current) use of anticoagulants: Secondary | ICD-10-CM

## 2018-05-20 DIAGNOSIS — Z5181 Encounter for therapeutic drug level monitoring: Secondary | ICD-10-CM | POA: Diagnosis not present

## 2018-05-20 LAB — POCT INR: INR: 2.6 (ref 2.0–3.0)

## 2018-05-20 NOTE — Patient Instructions (Signed)
Description   Continue taking 1/2 tablet daily except 1 tablet on Sundays and Thursdays. Continue 3 servings of greens a week. Recheck INR in 6 weeks.  Call with any questions [phone]

## 2018-05-21 NOTE — Progress Notes (Signed)
Remote pacemaker transmission.   

## 2018-05-22 LAB — CUP PACEART REMOTE DEVICE CHECK
Battery Impedance: 470 Ohm
Battery Voltage: 2.8 V
Brady Statistic AP VP Percent: 0 %
Brady Statistic AP VS Percent: 94 %
Brady Statistic AS VP Percent: 0 %
Brady Statistic AS VS Percent: 6 %
Implantable Lead Implant Date: 20070112
Implantable Lead Implant Date: 20070112
Implantable Lead Location: 753859
Implantable Lead Location: 753860
Implantable Lead Model: 5076
Implantable Lead Model: 5076
Implantable Pulse Generator Implant Date: 20131018
Lead Channel Impedance Value: 479 Ohm
Lead Channel Impedance Value: 518 Ohm
Lead Channel Pacing Threshold Amplitude: 0.5 V
Lead Channel Pacing Threshold Amplitude: 0.875 V
Lead Channel Pacing Threshold Pulse Width: 0.4 ms
Lead Channel Pacing Threshold Pulse Width: 0.4 ms
Lead Channel Setting Pacing Amplitude: 2.5 V
Lead Channel Setting Pacing Pulse Width: 0.34 ms
MDC IDC MSMT BATTERY REMAINING LONGEVITY: 92 mo
MDC IDC SESS DTM: 20191226204734
MDC IDC SET LEADCHNL RA PACING AMPLITUDE: 2 V
MDC IDC SET LEADCHNL RV SENSING SENSITIVITY: 2.8 mV

## 2018-05-27 DIAGNOSIS — F332 Major depressive disorder, recurrent severe without psychotic features: Secondary | ICD-10-CM | POA: Diagnosis not present

## 2018-06-07 ENCOUNTER — Encounter: Payer: Self-pay | Admitting: Internal Medicine

## 2018-06-07 ENCOUNTER — Ambulatory Visit (INDEPENDENT_AMBULATORY_CARE_PROVIDER_SITE_OTHER): Payer: Medicare Other | Admitting: Internal Medicine

## 2018-06-07 ENCOUNTER — Encounter

## 2018-06-07 VITALS — BP 146/88 | HR 80 | Ht 67.0 in | Wt 185.0 lb

## 2018-06-07 DIAGNOSIS — I495 Sick sinus syndrome: Secondary | ICD-10-CM

## 2018-06-07 DIAGNOSIS — I480 Paroxysmal atrial fibrillation: Secondary | ICD-10-CM | POA: Diagnosis not present

## 2018-06-07 DIAGNOSIS — I10 Essential (primary) hypertension: Secondary | ICD-10-CM | POA: Diagnosis not present

## 2018-06-07 DIAGNOSIS — Z950 Presence of cardiac pacemaker: Secondary | ICD-10-CM | POA: Diagnosis not present

## 2018-06-07 LAB — CUP PACEART INCLINIC DEVICE CHECK
Battery Impedance: 495 Ohm
Battery Remaining Longevity: 90 mo
Battery Voltage: 2.8 V
Brady Statistic AP VP Percent: 0 %
Brady Statistic AP VS Percent: 94 %
Brady Statistic AS VP Percent: 0 %
Brady Statistic AS VS Percent: 6 %
Implantable Lead Implant Date: 20070112
Implantable Lead Implant Date: 20070112
Implantable Lead Location: 753859
Implantable Lead Location: 753860
Implantable Lead Model: 5076
Implantable Lead Model: 5076
Implantable Pulse Generator Implant Date: 20131018
Lead Channel Impedance Value: 454 Ohm
Lead Channel Impedance Value: 596 Ohm
Lead Channel Pacing Threshold Amplitude: 0.5 V
Lead Channel Pacing Threshold Pulse Width: 0.34 ms
Lead Channel Pacing Threshold Pulse Width: 0.34 ms
Lead Channel Sensing Intrinsic Amplitude: 8 mV
Lead Channel Setting Pacing Amplitude: 2 V
Lead Channel Setting Pacing Amplitude: 2.5 V
Lead Channel Setting Pacing Pulse Width: 0.34 ms
MDC IDC MSMT LEADCHNL RV PACING THRESHOLD AMPLITUDE: 1 V
MDC IDC SESS DTM: 20200113090004
MDC IDC SET LEADCHNL RV SENSING SENSITIVITY: 2.8 mV

## 2018-06-07 NOTE — Progress Notes (Signed)
HPI [name] returns today for followup. He has a h/o sinus node dysfunction, PAF, s/p PPM insertion, remote catheter ablation, and arthritis. He was in the hospital with syncope a couple of weeks ago. He was told he had previously had a stroke. No symptoms.  Allergies  Allergen Reactions  . Iohexol      Code: HIVES, Desc: POST MYELOGRAM.  PT ALSO SAID THIS HAPPENED A YEAR AGO WITH A MYELOGRAM AND WITH ESI'S., Onset Date: [id number]   . Meperidine Hcl     REACTION: unspecified  . Prednisone     Redness of skin & jittery     Current Outpatient Medications  Medication Sig Dispense Refill  . ALPRAZolam (XANAX) 0.5 MG tablet Take 1 tablet (0.5 mg total) by mouth 3 (three) times daily as needed for anxiety. 90 tablet 0  . doxylamine, Sleep, (UNISOM) 25 MG tablet Take 25 mg by mouth at bedtime as needed for sleep.    Marland Kitchen FLUoxetine (PROZAC) 40 MG capsule Take one tablet each day for depression. (40mg  ) (Patient taking differently: Take 40 mg by mouth daily. Take one tablet by mouth each day for depression. (40mg  )) 30 capsule 0  . losartan (COZAAR) 50 MG tablet Take 1 tablet (50 mg total) by mouth daily. 90 tablet 2  . meloxicam (MOBIC) 15 MG tablet Take 15 mg by mouth daily.    . potassium chloride SA (K-DUR,KLOR-CON) 20 MEQ tablet Take one tablet two times a day for low potassium. (Patient taking differently: Take 20 mEq by mouth daily. )    . propranolol (INDERAL) 20 MG tablet Take 1 tablet (20 mg total) by mouth 3 (three) times daily. (BETA BLOCKER) please keep upcoming appt in January with Dr. Lovena Le. Thanks 270 tablet 0  . triamcinolone (NASACORT ALLERGY 24HR) 55 MCG/ACT AERO nasal inhaler Place 1 spray into the nose daily.    Marland Kitchen warfarin (COUMADIN) 5 MG tablet TAKE BY MOUTH AS DIRECTED BY ANTICOAGULATION CLINIC 30 tablet 3   No current facility-administered medications for this visit.      Past Medical History:  Diagnosis Date  . Anemia    since post op- knee replacement   .  Anxiety    takes xanax 2 times per day, taken mostly for ringing in ears    . Arthritis of knee, right    end-stage  . Bronchitis   . Bursitis   . Complication of anesthesia    irritation in throat postop, makes a-fib worse   . Depression   . DJD (degenerative joint disease)    OA- "everywhere"  . Family history of anesthesia complication    sister with nausia and vomiting  . Gout   . Heart murmur   . HTN (hypertension)   . Hyperlipidemia   . Migraines   . Paroxysmal atrial fibrillation (HCC)   . PONV (postoperative nausea and vomiting)   . Tinnitus     ROS:   All systems reviewed and negative except as noted in the HPI.   Past Surgical History:  Procedure Laterality Date  . APPENDECTOMY    . Atrial fibrillation ablation  2009, 12/02/11   PVI by Dr Rayann Heman x 2  . ATRIAL FIBRILLATION ABLATION N/A 12/02/2011   Procedure: ATRIAL FIBRILLATION ABLATION;  Surgeon: Thompson Grayer, MD;  Location: [facility] CATH LAB;  Service: Cardiovascular;  Laterality: N/A;  . bilteral thumb joint surgrery    . CARDIAC CATHETERIZATION  2005    which revealed 40% stenosis of  the mid left anterior descending artery., ablation- 2009  . CHOLECYSTECTOMY    . JOINT REPLACEMENT     L knee- 12/11,R hip- 1996  . left rotator cuff repair    . PACEMAKER INSERTION  06/06/2005   Medtronic EnRhythm dual-chamber. For sick sinus syndrome  . PERMANENT PACEMAKER GENERATOR CHANGE N/A 03/12/2012   Procedure: PERMANENT PACEMAKER GENERATOR CHANGE;  Surgeon: Evans Lance, MD;  Location: [facility] CATH LAB;  Service: Cardiovascular;  Laterality: N/A;  . RHINOPLASTY    . right rotator cuff surgery  2006  . right total hip arthroplasty    . TEE WITHOUT CARDIOVERSION  12/01/2011   Procedure: TRANSESOPHAGEAL ECHOCARDIOGRAM (TEE);  Surgeon: Fay Records, MD;  Location[facility] ENDOSCOPY;  Service: Cardiovascular;  Laterality: N/A;  a-fib ablation following day  . TONSILLECTOMY     as a child  . TOTAL KNEE REVISION  08/08/2011   Procedure:  TOTAL KNEE REVISION;  Surgeon: Kerin Salen, MD;  Location: [place];  Service: Orthopedics;  Laterality: Left;  DEPUY/SIGMA  . TRANSESOPHAGEAL ECHOCARDIOGRAM  04/2008  . TRANSTHORACIC ECHOCARDIOGRAM  2006,2009  . uvulectomy       Family History  Problem Relation Age of Onset  . Coronary artery disease Mother   . Cerebrovascular Disease Mother   . Cancer Other   . Anesthesia problems Neg Hx   . Hypotension Neg Hx   . Malignant hyperthermia Neg Hx   . Pseudochol deficiency Neg Hx      Social History   Socioeconomic History  . Marital status: Married    Spouse name: Not on file  . Number of children: 2  . Years of education: Not on file  . Highest education level: Not on file  Occupational History  . Occupation: retired    Fish farm manager: RETIRED  Social Needs  . Financial resource strain: Not on file  . Food insecurity:    Worry: Not on file    Inability: Not on file  . Transportation needs:    Medical: Not on file    Non-medical: Not on file  Tobacco Use  . Smoking status: Never Smoker  . Smokeless tobacco: Never Used  Substance and Sexual Activity  . Alcohol use: Yes    Comment: rare use  . Drug use: No  . Sexual activity: Not on file  Lifestyle  . Physical activity:    Days per week: Not on file    Minutes per session: Not on file  . Stress: Not on file  Relationships  . Social connections:    Talks on phone: Not on file    Gets together: Not on file    Attends religious service: Not on file    Active member of club or organization: Not on file    Attends meetings of clubs or organizations: Not on file    Relationship status: Not on file  . Intimate partner violence:    Fear of current or ex partner: Not on file    Emotionally abused: Not on file    Physically abused: Not on file    Forced sexual activity: Not on file  Other Topics Concern  . Not on file  Social History Narrative  . Not on file     BP (!) 146/88   Pulse 80   Ht 5\' 7"  (1.702 m)   Wt  185 lb (83.9 kg)   SpO2 98%   BMI 28.98 kg/m   Physical Exam:  Well appearing NAD HEENT: Unremarkable Neck:  No JVD, no thyromegally Lymphatics:  No adenopathy Back:  No CVA tenderness Lungs:  Clear HEART:  Regular rate rhythm, no murmurs, no rubs, no clicks Abd:  soft, positive bowel sounds, no organomegally, no rebound, no guarding Ext:  2 plus pulses, no edema, no cyanosis, no clubbing Skin:  No rashes no nodules Neuro:  CN II through XII intact, motor grossly intact  DEVICE  Normal device function.  See PaceArt for details.   Assess/Plan: 1. Orthostasis - his symptoms occur when he stands. I have asked him to slow down when getting up. He is encouraged to remain well hydrated. 2. PAF - he has been out of rhythm 6%. He will undergo watchful waiting. No change in his meds. 3. Sinus node dysfunction - he is doing well s/p PPM insertion. 4. PPM - his medtronic DDD PM is working normally. We will recheck in several months.  Loney Domingo,M.D.   Mikle Bosworth.D.

## 2018-06-07 NOTE — Patient Instructions (Signed)
Medication Instructions:  Your physician recommends that you continue on your current medications as directed. Please refer to the Current Medication list given to you today.  Labwork: None ordered.  Testing/Procedures: None ordered.  Follow-Up: Your physician wants you to follow-up in: one year with Dr. Lovena Le.   You will receive a reminder letter in the mail two months in advance. If you don't receive a letter, please call our office to schedule the follow-up appointment.  Remote monitoring is used to monitor your Pacemaker from home. This monitoring reduces the number of office visits required to check your device to one time per year. It allows [place] to keep an eye on the functioning of your device to ensure it is working properly. You are scheduled for a device check from home on 08/19/2018. You may send your transmission at any time that day. If you have a wireless device, the transmission will be sent automatically. After your physician reviews your transmission, you will receive a postcard with your next transmission date.  Any Other Special Instructions Will Be Listed Below (If Applicable).  If you need a refill on your cardiac medications before your next appointment, please call your pharmacy.

## 2018-06-09 DIAGNOSIS — E559 Vitamin D deficiency, unspecified: Secondary | ICD-10-CM | POA: Diagnosis not present

## 2018-06-09 DIAGNOSIS — M129 Arthropathy, unspecified: Secondary | ICD-10-CM | POA: Diagnosis not present

## 2018-06-09 DIAGNOSIS — R072 Precordial pain: Secondary | ICD-10-CM | POA: Diagnosis not present

## 2018-06-09 DIAGNOSIS — E038 Other specified hypothyroidism: Secondary | ICD-10-CM | POA: Diagnosis not present

## 2018-06-09 DIAGNOSIS — R5383 Other fatigue: Secondary | ICD-10-CM | POA: Diagnosis not present

## 2018-06-30 DIAGNOSIS — E7800 Pure hypercholesterolemia, unspecified: Secondary | ICD-10-CM | POA: Diagnosis not present

## 2018-06-30 DIAGNOSIS — R5383 Other fatigue: Secondary | ICD-10-CM | POA: Diagnosis not present

## 2018-06-30 DIAGNOSIS — I10 Essential (primary) hypertension: Secondary | ICD-10-CM | POA: Diagnosis not present

## 2018-06-30 DIAGNOSIS — E559 Vitamin D deficiency, unspecified: Secondary | ICD-10-CM | POA: Diagnosis not present

## 2018-07-01 ENCOUNTER — Ambulatory Visit (INDEPENDENT_AMBULATORY_CARE_PROVIDER_SITE_OTHER): Payer: Medicare Other | Admitting: *Deleted

## 2018-07-01 DIAGNOSIS — Z5181 Encounter for therapeutic drug level monitoring: Secondary | ICD-10-CM | POA: Diagnosis not present

## 2018-07-01 DIAGNOSIS — I4891 Unspecified atrial fibrillation: Secondary | ICD-10-CM | POA: Diagnosis not present

## 2018-07-01 DIAGNOSIS — Z7901 Long term (current) use of anticoagulants: Secondary | ICD-10-CM

## 2018-07-01 LAB — POCT INR: INR: 2.9 (ref 2.0–3.0)

## 2018-07-01 NOTE — Patient Instructions (Signed)
Description   Continue taking 1/2 tablet daily except 1 tablet on Sundays and Thursdays. Continue 3 servings of greens a week. Recheck INR in 6 weeks.  Call with any questions [phone]

## 2018-07-13 ENCOUNTER — Other Ambulatory Visit: Payer: Self-pay | Admitting: Internal Medicine

## 2018-07-29 DIAGNOSIS — H2513 Age-related nuclear cataract, bilateral: Secondary | ICD-10-CM | POA: Diagnosis not present

## 2018-08-11 ENCOUNTER — Other Ambulatory Visit: Payer: Self-pay | Admitting: Internal Medicine

## 2018-08-12 ENCOUNTER — Ambulatory Visit (INDEPENDENT_AMBULATORY_CARE_PROVIDER_SITE_OTHER): Payer: Medicare Other | Admitting: Pharmacist

## 2018-08-12 ENCOUNTER — Other Ambulatory Visit: Payer: Self-pay

## 2018-08-12 DIAGNOSIS — Z7901 Long term (current) use of anticoagulants: Secondary | ICD-10-CM | POA: Diagnosis not present

## 2018-08-12 DIAGNOSIS — I4891 Unspecified atrial fibrillation: Secondary | ICD-10-CM | POA: Diagnosis not present

## 2018-08-12 DIAGNOSIS — Z5181 Encounter for therapeutic drug level monitoring: Secondary | ICD-10-CM

## 2018-08-12 LAB — POCT INR: INR: 2.4 (ref 2.0–3.0)

## 2018-08-12 NOTE — Patient Instructions (Signed)
Description   Continue taking 1/2 tablet daily except 1 tablet on Sundays and Thursdays. Continue 3 servings of greens a week. Recheck INR in 6 weeks. We will re-discuss transitioning to Eliquis (apixaban) at your next visit. Call with any questions [phone]

## 2018-08-19 ENCOUNTER — Other Ambulatory Visit: Payer: Self-pay

## 2018-08-19 ENCOUNTER — Ambulatory Visit (INDEPENDENT_AMBULATORY_CARE_PROVIDER_SITE_OTHER): Payer: Medicare Other | Admitting: *Deleted

## 2018-08-19 DIAGNOSIS — I495 Sick sinus syndrome: Secondary | ICD-10-CM | POA: Diagnosis not present

## 2018-08-20 ENCOUNTER — Telehealth: Payer: Self-pay

## 2018-08-20 NOTE — Telephone Encounter (Signed)
Spoke with patient to remind of missed remote transmission 

## 2018-08-21 LAB — CUP PACEART REMOTE DEVICE CHECK
Battery Impedance: 544 Ohm
Battery Remaining Longevity: 88 mo
Brady Statistic AP VP Percent: 0 %
Brady Statistic AP VS Percent: 99 %
Brady Statistic AS VP Percent: 0 %
Brady Statistic AS VS Percent: 1 %
Date Time Interrogation Session: 20200327201645
Implantable Lead Implant Date: 20070112
Implantable Lead Implant Date: 20070112
Implantable Lead Location: 753859
Implantable Lead Model: 5076
Implantable Lead Model: 5076
Implantable Pulse Generator Implant Date: 20131018
Lead Channel Impedance Value: 646 Ohm
Lead Channel Pacing Threshold Amplitude: 0.875 V
Lead Channel Pacing Threshold Pulse Width: 0.4 ms
Lead Channel Pacing Threshold Pulse Width: 0.4 ms
Lead Channel Setting Pacing Amplitude: 2 V
Lead Channel Setting Pacing Amplitude: 2.5 V
Lead Channel Setting Pacing Pulse Width: 0.34 ms
Lead Channel Setting Sensing Sensitivity: 2.8 mV
MDC IDC LEAD LOCATION: 753860
MDC IDC MSMT BATTERY VOLTAGE: 2.79 V
MDC IDC MSMT LEADCHNL RA IMPEDANCE VALUE: 507 Ohm
MDC IDC MSMT LEADCHNL RA PACING THRESHOLD AMPLITUDE: 0.5 V

## 2018-08-24 ENCOUNTER — Encounter: Payer: Self-pay | Admitting: Cardiology

## 2018-08-24 NOTE — Progress Notes (Signed)
Remote pacemaker transmission.   

## 2018-09-23 ENCOUNTER — Telehealth: Payer: Self-pay

## 2018-09-23 NOTE — Telephone Encounter (Signed)
lmom for prescreen  

## 2018-09-24 NOTE — Telephone Encounter (Signed)

## 2018-09-27 ENCOUNTER — Telehealth: Payer: Self-pay | Admitting: Cardiovascular Disease

## 2018-09-27 ENCOUNTER — Ambulatory Visit (INDEPENDENT_AMBULATORY_CARE_PROVIDER_SITE_OTHER): Payer: Medicare Other | Admitting: Pharmacist

## 2018-09-27 ENCOUNTER — Other Ambulatory Visit: Payer: Self-pay

## 2018-09-27 DIAGNOSIS — Z5181 Encounter for therapeutic drug level monitoring: Secondary | ICD-10-CM

## 2018-09-27 DIAGNOSIS — Z7901 Long term (current) use of anticoagulants: Secondary | ICD-10-CM

## 2018-09-27 DIAGNOSIS — I4891 Unspecified atrial fibrillation: Secondary | ICD-10-CM

## 2018-09-27 LAB — POCT INR: INR: 2.7 (ref 2.0–3.0)

## 2018-09-27 NOTE — Telephone Encounter (Signed)
Pharm D visit

## 2018-10-20 DIAGNOSIS — Z1159 Encounter for screening for other viral diseases: Secondary | ICD-10-CM | POA: Diagnosis not present

## 2018-11-05 ENCOUNTER — Other Ambulatory Visit: Payer: Self-pay | Admitting: Internal Medicine

## 2018-11-15 ENCOUNTER — Telehealth: Payer: Self-pay

## 2018-11-15 NOTE — Telephone Encounter (Signed)

## 2018-11-17 DIAGNOSIS — F332 Major depressive disorder, recurrent severe without psychotic features: Secondary | ICD-10-CM | POA: Diagnosis not present

## 2018-11-18 ENCOUNTER — Ambulatory Visit (INDEPENDENT_AMBULATORY_CARE_PROVIDER_SITE_OTHER): Payer: Medicare Other | Admitting: *Deleted

## 2018-11-18 DIAGNOSIS — I495 Sick sinus syndrome: Secondary | ICD-10-CM

## 2018-11-19 ENCOUNTER — Telehealth: Payer: Self-pay

## 2018-11-19 NOTE — Telephone Encounter (Signed)
Left message for patient to remind of missed remote transmission.  

## 2018-11-20 LAB — CUP PACEART REMOTE DEVICE CHECK
Battery Impedance: 594 Ohm
Battery Remaining Longevity: 84 mo
Battery Voltage: 2.79 V
Brady Statistic AP VP Percent: 0 %
Brady Statistic AP VS Percent: 99 %
Brady Statistic AS VP Percent: 0 %
Brady Statistic AS VS Percent: 1 %
Date Time Interrogation Session: 20200626194436
Implantable Lead Implant Date: 20070112
Implantable Lead Implant Date: 20070112
Implantable Lead Location: 753859
Implantable Lead Location: 753860
Implantable Lead Model: 5076
Implantable Lead Model: 5076
Implantable Pulse Generator Implant Date: 20131018
Lead Channel Impedance Value: 486 Ohm
Lead Channel Impedance Value: 606 Ohm
Lead Channel Pacing Threshold Amplitude: 0.5 V
Lead Channel Pacing Threshold Amplitude: 0.875 V
Lead Channel Pacing Threshold Pulse Width: 0.4 ms
Lead Channel Pacing Threshold Pulse Width: 0.4 ms
Lead Channel Setting Pacing Amplitude: 2 V
Lead Channel Setting Pacing Amplitude: 2.5 V
Lead Channel Setting Pacing Pulse Width: 0.34 ms
Lead Channel Setting Sensing Sensitivity: 2.8 mV

## 2018-11-22 ENCOUNTER — Ambulatory Visit (INDEPENDENT_AMBULATORY_CARE_PROVIDER_SITE_OTHER): Payer: Medicare Other | Admitting: *Deleted

## 2018-11-22 ENCOUNTER — Other Ambulatory Visit: Payer: Self-pay

## 2018-11-22 DIAGNOSIS — I4891 Unspecified atrial fibrillation: Secondary | ICD-10-CM | POA: Diagnosis not present

## 2018-11-22 DIAGNOSIS — Z7901 Long term (current) use of anticoagulants: Secondary | ICD-10-CM | POA: Diagnosis not present

## 2018-11-22 DIAGNOSIS — Z5181 Encounter for therapeutic drug level monitoring: Secondary | ICD-10-CM

## 2018-11-22 LAB — POCT INR: INR: 3.5 — AB (ref 2.0–3.0)

## 2018-11-22 NOTE — Patient Instructions (Signed)
Description   Do not take any Coumadin tomorrow then continue taking 1/2 tablet daily except 1 tablet on Sundays and Thursdays. Recheck INR in 4 weeks. Patient declined switching to DOAC at this time. Call with any questions [phone]

## 2018-11-26 ENCOUNTER — Encounter: Payer: Self-pay | Admitting: Cardiology

## 2018-11-26 NOTE — Progress Notes (Signed)
Remote pacemaker transmission.   

## 2018-12-06 DIAGNOSIS — C61 Malignant neoplasm of prostate: Secondary | ICD-10-CM | POA: Diagnosis not present

## 2018-12-15 DIAGNOSIS — R3911 Hesitancy of micturition: Secondary | ICD-10-CM | POA: Diagnosis not present

## 2018-12-15 DIAGNOSIS — N401 Enlarged prostate with lower urinary tract symptoms: Secondary | ICD-10-CM | POA: Diagnosis not present

## 2018-12-16 ENCOUNTER — Telehealth: Payer: Self-pay | Admitting: *Deleted

## 2018-12-16 NOTE — Telephone Encounter (Signed)

## 2018-12-20 ENCOUNTER — Ambulatory Visit (INDEPENDENT_AMBULATORY_CARE_PROVIDER_SITE_OTHER): Payer: Medicare Other | Admitting: *Deleted

## 2018-12-20 ENCOUNTER — Other Ambulatory Visit: Payer: Self-pay

## 2018-12-20 DIAGNOSIS — Z5181 Encounter for therapeutic drug level monitoring: Secondary | ICD-10-CM | POA: Diagnosis not present

## 2018-12-20 DIAGNOSIS — I4891 Unspecified atrial fibrillation: Secondary | ICD-10-CM

## 2018-12-20 DIAGNOSIS — Z7901 Long term (current) use of anticoagulants: Secondary | ICD-10-CM | POA: Diagnosis not present

## 2018-12-20 LAB — POCT INR: INR: 2.6 (ref 2.0–3.0)

## 2018-12-20 NOTE — Patient Instructions (Signed)
Description   Continue taking 1/2 tablet daily except 1 tablet on Sundays and Thursdays. Recheck INR in 4 weeks. Patient declined switching to DOAC at this time. Call with any questions [phone]

## 2019-01-07 DIAGNOSIS — S50861A Insect bite (nonvenomous) of right forearm, initial encounter: Secondary | ICD-10-CM | POA: Diagnosis not present

## 2019-01-07 DIAGNOSIS — L570 Actinic keratosis: Secondary | ICD-10-CM | POA: Diagnosis not present

## 2019-01-07 DIAGNOSIS — Z1283 Encounter for screening for malignant neoplasm of skin: Secondary | ICD-10-CM | POA: Diagnosis not present

## 2019-01-07 DIAGNOSIS — D225 Melanocytic nevi of trunk: Secondary | ICD-10-CM | POA: Diagnosis not present

## 2019-01-07 DIAGNOSIS — X32XXXD Exposure to sunlight, subsequent encounter: Secondary | ICD-10-CM | POA: Diagnosis not present

## 2019-01-24 ENCOUNTER — Other Ambulatory Visit: Payer: Self-pay

## 2019-01-24 ENCOUNTER — Ambulatory Visit (INDEPENDENT_AMBULATORY_CARE_PROVIDER_SITE_OTHER): Payer: Medicare Other | Admitting: *Deleted

## 2019-01-24 DIAGNOSIS — Z7901 Long term (current) use of anticoagulants: Secondary | ICD-10-CM | POA: Diagnosis not present

## 2019-01-24 DIAGNOSIS — I4891 Unspecified atrial fibrillation: Secondary | ICD-10-CM

## 2019-01-24 DIAGNOSIS — Z5181 Encounter for therapeutic drug level monitoring: Secondary | ICD-10-CM | POA: Diagnosis not present

## 2019-01-24 LAB — POCT INR: INR: 2.4 (ref 2.0–3.0)

## 2019-01-24 NOTE — Patient Instructions (Signed)
Description   Continue taking 1/2 tablet daily except 1 tablet on Sundays and Thursdays. Recheck INR in 5 weeks. Patient declined switching to DOAC at this time. Call with any questions [phone]

## 2019-02-12 DIAGNOSIS — Z23 Encounter for immunization: Secondary | ICD-10-CM | POA: Diagnosis not present

## 2019-02-17 ENCOUNTER — Ambulatory Visit (INDEPENDENT_AMBULATORY_CARE_PROVIDER_SITE_OTHER): Payer: Medicare Other | Admitting: *Deleted

## 2019-02-17 DIAGNOSIS — I495 Sick sinus syndrome: Secondary | ICD-10-CM

## 2019-02-18 LAB — CUP PACEART REMOTE DEVICE CHECK
Battery Impedance: 669 Ohm
Battery Remaining Longevity: 79 mo
Battery Voltage: 2.8 V
Brady Statistic AP VP Percent: 0 %
Brady Statistic AP VS Percent: 99 %
Brady Statistic AS VP Percent: 0 %
Brady Statistic AS VS Percent: 1 %
Date Time Interrogation Session: 20200924160839
Implantable Lead Implant Date: 20070112
Implantable Lead Implant Date: 20070112
Implantable Lead Location: 753859
Implantable Lead Location: 753860
Implantable Lead Model: 5076
Implantable Lead Model: 5076
Implantable Pulse Generator Implant Date: 20131018
Lead Channel Impedance Value: 486 Ohm
Lead Channel Impedance Value: 621 Ohm
Lead Channel Pacing Threshold Amplitude: 0.5 V
Lead Channel Pacing Threshold Amplitude: 1 V
Lead Channel Pacing Threshold Pulse Width: 0.4 ms
Lead Channel Pacing Threshold Pulse Width: 0.4 ms
Lead Channel Setting Pacing Amplitude: 2 V
Lead Channel Setting Pacing Amplitude: 2.5 V
Lead Channel Setting Pacing Pulse Width: 0.34 ms
Lead Channel Setting Sensing Sensitivity: 2.8 mV

## 2019-02-25 ENCOUNTER — Encounter: Payer: Self-pay | Admitting: Cardiology

## 2019-02-25 NOTE — Progress Notes (Signed)
Remote pacemaker transmission.   

## 2019-02-28 ENCOUNTER — Ambulatory Visit (INDEPENDENT_AMBULATORY_CARE_PROVIDER_SITE_OTHER): Payer: Medicare Other | Admitting: *Deleted

## 2019-02-28 ENCOUNTER — Encounter (INDEPENDENT_AMBULATORY_CARE_PROVIDER_SITE_OTHER): Payer: Self-pay

## 2019-02-28 ENCOUNTER — Other Ambulatory Visit: Payer: Self-pay

## 2019-02-28 DIAGNOSIS — Z7901 Long term (current) use of anticoagulants: Secondary | ICD-10-CM

## 2019-02-28 DIAGNOSIS — Z5181 Encounter for therapeutic drug level monitoring: Secondary | ICD-10-CM | POA: Diagnosis not present

## 2019-02-28 DIAGNOSIS — I4891 Unspecified atrial fibrillation: Secondary | ICD-10-CM | POA: Diagnosis not present

## 2019-02-28 LAB — POCT INR: INR: 2.4 (ref 2.0–3.0)

## 2019-02-28 NOTE — Patient Instructions (Signed)
Description   Continue taking 1/2 tablet daily except 1 tablet on Sundays and Thursdays. Recheck INR in 6 weeks. Patient declined switching to DOAC at this time. Call with any questions [phone]

## 2019-03-04 DIAGNOSIS — Z03818 Encounter for observation for suspected exposure to other biological agents ruled out: Secondary | ICD-10-CM | POA: Diagnosis not present

## 2019-04-04 ENCOUNTER — Other Ambulatory Visit: Payer: Self-pay | Admitting: Internal Medicine

## 2019-04-11 ENCOUNTER — Ambulatory Visit (INDEPENDENT_AMBULATORY_CARE_PROVIDER_SITE_OTHER): Payer: Medicare Other | Admitting: *Deleted

## 2019-04-11 ENCOUNTER — Other Ambulatory Visit: Payer: Self-pay

## 2019-04-11 DIAGNOSIS — Z7901 Long term (current) use of anticoagulants: Secondary | ICD-10-CM | POA: Diagnosis not present

## 2019-04-11 DIAGNOSIS — I4891 Unspecified atrial fibrillation: Secondary | ICD-10-CM | POA: Diagnosis not present

## 2019-04-11 DIAGNOSIS — Z5181 Encounter for therapeutic drug level monitoring: Secondary | ICD-10-CM | POA: Diagnosis not present

## 2019-04-11 LAB — POCT INR: INR: 1.8 — AB (ref 2.0–3.0)

## 2019-04-11 NOTE — Patient Instructions (Signed)
Description   Today take 1 tablet then continue taking 1/2 tablet daily except 1 tablet on Sundays and Thursdays. Recheck INR in 4 weeks. Patient declined switching to DOAC at this time. Call with any questions [phone]

## 2019-04-12 ENCOUNTER — Telehealth: Payer: Self-pay | Admitting: Internal Medicine

## 2019-04-12 DIAGNOSIS — M545 Low back pain: Secondary | ICD-10-CM | POA: Diagnosis not present

## 2019-04-12 NOTE — Telephone Encounter (Signed)
New Message    Patient has threw his back out and Dr. Mayer Camel at [facility] would like to put patient on Prednisone. Patient's wife is calling in to see if it is ok for patient to take prednisone. Please give patient's wife a call back to advise.

## 2019-04-13 NOTE — Telephone Encounter (Signed)
Call returned to Pt's wife.  Advised ok to take, but let coumadin clinic know if he decides to go ahead.  Wife will let Pt know.  Wife did not indicate if Pt would take medication or not.

## 2019-04-13 NOTE — Telephone Encounter (Signed)
He can take prednisone but we'll need to check his INR sooner if he starts it due to a drug interaction with warfarin. If pt starts therapy, we'll reach out to move up his next INR check.

## 2019-04-13 NOTE — Telephone Encounter (Signed)
Follow up  Please return call to spouse regarding Prednisone

## 2019-04-19 DIAGNOSIS — M47816 Spondylosis without myelopathy or radiculopathy, lumbar region: Secondary | ICD-10-CM | POA: Diagnosis not present

## 2019-04-19 DIAGNOSIS — S39012D Strain of muscle, fascia and tendon of lower back, subsequent encounter: Secondary | ICD-10-CM | POA: Diagnosis not present

## 2019-04-20 DIAGNOSIS — M47816 Spondylosis without myelopathy or radiculopathy, lumbar region: Secondary | ICD-10-CM | POA: Diagnosis not present

## 2019-04-20 DIAGNOSIS — S39012D Strain of muscle, fascia and tendon of lower back, subsequent encounter: Secondary | ICD-10-CM | POA: Diagnosis not present

## 2019-04-27 DIAGNOSIS — S39012D Strain of muscle, fascia and tendon of lower back, subsequent encounter: Secondary | ICD-10-CM | POA: Diagnosis not present

## 2019-05-02 DIAGNOSIS — S39012D Strain of muscle, fascia and tendon of lower back, subsequent encounter: Secondary | ICD-10-CM | POA: Diagnosis not present

## 2019-05-02 DIAGNOSIS — M47816 Spondylosis without myelopathy or radiculopathy, lumbar region: Secondary | ICD-10-CM | POA: Diagnosis not present

## 2019-05-09 ENCOUNTER — Other Ambulatory Visit: Payer: Self-pay

## 2019-05-09 ENCOUNTER — Ambulatory Visit (INDEPENDENT_AMBULATORY_CARE_PROVIDER_SITE_OTHER): Payer: Medicare Other | Admitting: Pharmacist

## 2019-05-09 DIAGNOSIS — Z5181 Encounter for therapeutic drug level monitoring: Secondary | ICD-10-CM

## 2019-05-09 DIAGNOSIS — Z7901 Long term (current) use of anticoagulants: Secondary | ICD-10-CM | POA: Diagnosis not present

## 2019-05-09 DIAGNOSIS — I4891 Unspecified atrial fibrillation: Secondary | ICD-10-CM

## 2019-05-09 LAB — POCT INR: INR: 3.8 — AB (ref 2.0–3.0)

## 2019-05-09 NOTE — Patient Instructions (Signed)
Description   Hold today's dose and eat some greens then continue taking 1/2 tablet daily except 1 tablet on Sundays and Thursdays. Recheck INR in 4 weeks. Call with any questions [phone]

## 2019-05-19 ENCOUNTER — Ambulatory Visit (INDEPENDENT_AMBULATORY_CARE_PROVIDER_SITE_OTHER): Payer: Medicare Other | Admitting: *Deleted

## 2019-05-19 DIAGNOSIS — Z950 Presence of cardiac pacemaker: Secondary | ICD-10-CM

## 2019-05-20 LAB — CUP PACEART REMOTE DEVICE CHECK
Battery Impedance: 719 Ohm
Battery Remaining Longevity: 76 mo
Battery Voltage: 2.79 V
Brady Statistic AP VP Percent: 0 %
Brady Statistic AP VS Percent: 99 %
Brady Statistic AS VP Percent: 0 %
Brady Statistic AS VS Percent: 1 %
Date Time Interrogation Session: 20201224125944
Implantable Lead Implant Date: 20070112
Implantable Lead Implant Date: 20070112
Implantable Lead Location: 753859
Implantable Lead Location: 753860
Implantable Lead Model: 5076
Implantable Lead Model: 5076
Implantable Pulse Generator Implant Date: 20131018
Lead Channel Impedance Value: 484 Ohm
Lead Channel Impedance Value: 553 Ohm
Lead Channel Pacing Threshold Amplitude: 0.5 V
Lead Channel Pacing Threshold Amplitude: 1 V
Lead Channel Pacing Threshold Pulse Width: 0.4 ms
Lead Channel Pacing Threshold Pulse Width: 0.4 ms
Lead Channel Sensing Intrinsic Amplitude: 5.6 mV
Lead Channel Setting Pacing Amplitude: 2 V
Lead Channel Setting Pacing Amplitude: 2.5 V
Lead Channel Setting Pacing Pulse Width: 0.34 ms
Lead Channel Setting Sensing Sensitivity: 2.8 mV

## 2019-06-06 ENCOUNTER — Other Ambulatory Visit: Payer: Self-pay

## 2019-06-06 ENCOUNTER — Ambulatory Visit (INDEPENDENT_AMBULATORY_CARE_PROVIDER_SITE_OTHER): Payer: Medicare Other | Admitting: *Deleted

## 2019-06-06 ENCOUNTER — Encounter (INDEPENDENT_AMBULATORY_CARE_PROVIDER_SITE_OTHER): Payer: Self-pay

## 2019-06-06 DIAGNOSIS — Z7901 Long term (current) use of anticoagulants: Secondary | ICD-10-CM

## 2019-06-06 DIAGNOSIS — I4891 Unspecified atrial fibrillation: Secondary | ICD-10-CM | POA: Diagnosis not present

## 2019-06-06 DIAGNOSIS — Z5181 Encounter for therapeutic drug level monitoring: Secondary | ICD-10-CM | POA: Diagnosis not present

## 2019-06-06 LAB — POCT INR: INR: 2.2 (ref 2.0–3.0)

## 2019-06-06 NOTE — Patient Instructions (Addendum)
Description   Continue taking 1/2 tablet daily except 1 tablet on Sundays and Thursdays. Recheck INR in 3 weeks with MD appt. Call with any questions [phone]

## 2019-06-15 DIAGNOSIS — R0602 Shortness of breath: Secondary | ICD-10-CM | POA: Diagnosis not present

## 2019-06-15 DIAGNOSIS — Z9109 Other allergy status, other than to drugs and biological substances: Secondary | ICD-10-CM | POA: Diagnosis not present

## 2019-06-15 DIAGNOSIS — Z125 Encounter for screening for malignant neoplasm of prostate: Secondary | ICD-10-CM | POA: Diagnosis not present

## 2019-06-15 DIAGNOSIS — Z03818 Encounter for observation for suspected exposure to other biological agents ruled out: Secondary | ICD-10-CM | POA: Diagnosis not present

## 2019-06-15 DIAGNOSIS — Z131 Encounter for screening for diabetes mellitus: Secondary | ICD-10-CM | POA: Diagnosis not present

## 2019-06-15 DIAGNOSIS — R5383 Other fatigue: Secondary | ICD-10-CM | POA: Diagnosis not present

## 2019-06-15 DIAGNOSIS — E559 Vitamin D deficiency, unspecified: Secondary | ICD-10-CM | POA: Diagnosis not present

## 2019-06-15 DIAGNOSIS — Z0000 Encounter for general adult medical examination without abnormal findings: Secondary | ICD-10-CM | POA: Diagnosis not present

## 2019-06-15 DIAGNOSIS — R072 Precordial pain: Secondary | ICD-10-CM | POA: Diagnosis not present

## 2019-06-15 DIAGNOSIS — Z1159 Encounter for screening for other viral diseases: Secondary | ICD-10-CM | POA: Diagnosis not present

## 2019-06-15 DIAGNOSIS — R300 Dysuria: Secondary | ICD-10-CM | POA: Diagnosis not present

## 2019-06-30 ENCOUNTER — Encounter: Payer: Medicare Other | Admitting: Internal Medicine

## 2019-07-04 ENCOUNTER — Other Ambulatory Visit: Payer: Self-pay

## 2019-07-04 ENCOUNTER — Ambulatory Visit (INDEPENDENT_AMBULATORY_CARE_PROVIDER_SITE_OTHER): Payer: Medicare Other | Admitting: *Deleted

## 2019-07-04 ENCOUNTER — Telehealth: Payer: Self-pay | Admitting: Internal Medicine

## 2019-07-04 DIAGNOSIS — Z5181 Encounter for therapeutic drug level monitoring: Secondary | ICD-10-CM | POA: Diagnosis not present

## 2019-07-04 DIAGNOSIS — I4891 Unspecified atrial fibrillation: Secondary | ICD-10-CM

## 2019-07-04 DIAGNOSIS — Z7901 Long term (current) use of anticoagulants: Secondary | ICD-10-CM

## 2019-07-04 LAB — POCT INR: INR: 2 (ref 2.0–3.0)

## 2019-07-04 NOTE — Patient Instructions (Addendum)
Description   Continue taking 1/2 tablet daily except 1 tablet on Sundays and Thursdays. Recheck INR in 2 weeks. Call with any questions [phone]

## 2019-07-08 DIAGNOSIS — L570 Actinic keratosis: Secondary | ICD-10-CM | POA: Diagnosis not present

## 2019-07-08 DIAGNOSIS — D225 Melanocytic nevi of trunk: Secondary | ICD-10-CM | POA: Diagnosis not present

## 2019-07-08 DIAGNOSIS — X32XXXD Exposure to sunlight, subsequent encounter: Secondary | ICD-10-CM | POA: Diagnosis not present

## 2019-07-15 DIAGNOSIS — Z9109 Other allergy status, other than to drugs and biological substances: Secondary | ICD-10-CM | POA: Diagnosis not present

## 2019-07-18 ENCOUNTER — Ambulatory Visit (INDEPENDENT_AMBULATORY_CARE_PROVIDER_SITE_OTHER): Payer: Medicare Other | Admitting: *Deleted

## 2019-07-18 ENCOUNTER — Other Ambulatory Visit: Payer: Self-pay

## 2019-07-18 DIAGNOSIS — Z7901 Long term (current) use of anticoagulants: Secondary | ICD-10-CM

## 2019-07-18 DIAGNOSIS — Z5181 Encounter for therapeutic drug level monitoring: Secondary | ICD-10-CM

## 2019-07-18 DIAGNOSIS — I4891 Unspecified atrial fibrillation: Secondary | ICD-10-CM | POA: Diagnosis not present

## 2019-07-18 LAB — POCT INR: INR: 2 (ref 2.0–3.0)

## 2019-07-18 NOTE — Patient Instructions (Signed)
Description   Today take 1 tablet then continue taking 1/2 tablet daily except 1 tablet on Sundays and Thursdays. Recheck INR in 2 weeks with MD appt. Call with any questions [phone]

## 2019-07-20 DIAGNOSIS — J302 Other seasonal allergic rhinitis: Secondary | ICD-10-CM | POA: Diagnosis not present

## 2019-07-20 DIAGNOSIS — R769 Abnormal immunological finding in serum, unspecified: Secondary | ICD-10-CM | POA: Diagnosis not present

## 2019-08-02 ENCOUNTER — Other Ambulatory Visit: Payer: Self-pay | Admitting: Internal Medicine

## 2019-08-03 ENCOUNTER — Ambulatory Visit (INDEPENDENT_AMBULATORY_CARE_PROVIDER_SITE_OTHER): Payer: Medicare Other | Admitting: Internal Medicine

## 2019-08-03 ENCOUNTER — Ambulatory Visit (INDEPENDENT_AMBULATORY_CARE_PROVIDER_SITE_OTHER): Payer: Medicare Other | Admitting: *Deleted

## 2019-08-03 ENCOUNTER — Other Ambulatory Visit: Payer: Self-pay

## 2019-08-03 ENCOUNTER — Encounter: Payer: Self-pay | Admitting: Internal Medicine

## 2019-08-03 VITALS — BP 122/86 | HR 81 | Ht 67.0 in | Wt 177.1 lb

## 2019-08-03 DIAGNOSIS — I495 Sick sinus syndrome: Secondary | ICD-10-CM

## 2019-08-03 DIAGNOSIS — Z950 Presence of cardiac pacemaker: Secondary | ICD-10-CM

## 2019-08-03 DIAGNOSIS — I480 Paroxysmal atrial fibrillation: Secondary | ICD-10-CM

## 2019-08-03 DIAGNOSIS — Z5181 Encounter for therapeutic drug level monitoring: Secondary | ICD-10-CM | POA: Diagnosis not present

## 2019-08-03 DIAGNOSIS — J302 Other seasonal allergic rhinitis: Secondary | ICD-10-CM | POA: Diagnosis not present

## 2019-08-03 DIAGNOSIS — I4891 Unspecified atrial fibrillation: Secondary | ICD-10-CM

## 2019-08-03 DIAGNOSIS — Z7901 Long term (current) use of anticoagulants: Secondary | ICD-10-CM

## 2019-08-03 DIAGNOSIS — Z0182 Encounter for allergy testing: Secondary | ICD-10-CM | POA: Diagnosis not present

## 2019-08-03 LAB — POCT INR: INR: 3.2 — AB (ref 2.0–3.0)

## 2019-08-03 NOTE — Patient Instructions (Signed)
Description   Hold today's dose then continue taking 1/2 tablet daily except 1 tablet on Sundays and Thursdays. Recheck INR in 3 weeks. Call with any questions [phone]

## 2019-08-03 NOTE — Progress Notes (Signed)
HPI [name] returns today for followup. He has a h/o PAF, s/p cather ablation. He has lost weight and has very few palpitations. No chest pain or sob. No syncope. He has gone back to playing golf. No edema.  Allergies  Allergen Reactions  . Iohexol      Code: HIVES, Desc: POST MYELOGRAM.  PT ALSO SAID THIS HAPPENED A YEAR AGO WITH A MYELOGRAM AND WITH ESI'S., Onset Date: [id number]   . Meperidine Hcl     REACTION: unspecified  . Prednisone     Redness of skin & jittery     Current Outpatient Medications  Medication Sig Dispense Refill  . ALPRAZolam (XANAX) 0.5 MG tablet Take 1 tablet (0.5 mg total) by mouth 3 (three) times daily as needed for anxiety. 90 tablet 0  . doxylamine, Sleep, (UNISOM) 25 MG tablet Take 25 mg by mouth at bedtime as needed for sleep.    Marland Kitchen FLUoxetine (PROZAC) 40 MG capsule Take one tablet each day for depression. (40mg  ) 30 capsule 0  . fluticasone (FLONASE) 50 MCG/ACT nasal spray Place 2 sprays into both nostrils daily.    Marland Kitchen losartan (COZAAR) 50 MG tablet Take 1 tablet (50 mg total) by mouth daily. 90 tablet 0  . meloxicam (MOBIC) 15 MG tablet Take 15 mg by mouth as needed.     . potassium chloride SA (K-DUR,KLOR-CON) 20 MEQ tablet Take one tablet two times a day for low potassium.    . propranolol (INDERAL) 20 MG tablet TAKE 1 TABLET BY MOUTH THREE TIMES DAILY 270 tablet 0  . warfarin (COUMADIN) 5 MG tablet TAKE AS DIRECTED BY ANTICOAGULATION CLINIC 30 tablet 2   No current facility-administered medications for this visit.     Past Medical History:  Diagnosis Date  . Anemia    since post op- knee replacement   . Anxiety    takes xanax 2 times per day, taken mostly for ringing in ears    . Arthritis of knee, right    end-stage  . Bronchitis   . Bursitis   . Complication of anesthesia    irritation in throat postop, makes a-fib worse   . Depression   . DJD (degenerative joint disease)    OA- "everywhere"  . Family history of anesthesia  complication    sister with nausia and vomiting  . Gout   . Heart murmur   . HTN (hypertension)   . Hyperlipidemia   . Migraines   . Paroxysmal atrial fibrillation (HCC)   . PONV (postoperative nausea and vomiting)   . Tinnitus     ROS:   All systems reviewed and negative except as noted in the HPI.   Past Surgical History:  Procedure Laterality Date  . APPENDECTOMY    . Atrial fibrillation ablation  2009, 12/02/11   PVI by Dr [name] [name] x 2  . ATRIAL FIBRILLATION ABLATION N/A 12/02/2011   Procedure: ATRIAL FIBRILLATION ABLATION;  Surgeon: [name] Grayer, MD;  Location: [facility] CATH LAB;  Service: Cardiovascular;  Laterality: N/A;  . bilteral thumb joint surgrery    . CARDIAC CATHETERIZATION  2005    which revealed 40% stenosis of the mid left anterior descending artery., ablation- 2009  . CHOLECYSTECTOMY    . JOINT REPLACEMENT     L knee- 12/11,R hip- 1996  . left rotator cuff repair    . PACEMAKER INSERTION  06/06/2005   Medtronic EnRhythm dual-chamber. For sick sinus syndrome  . PERMANENT PACEMAKER GENERATOR CHANGE N/A  03/12/2012   Procedure: PERMANENT PACEMAKER GENERATOR CHANGE;  Surgeon: [name] Lance, MD;  Location: [facility] CATH LAB;  Service: Cardiovascular;  Laterality: N/A;  . RHINOPLASTY    . right rotator cuff surgery  2006  . right total hip arthroplasty    . TEE WITHOUT CARDIOVERSION  12/01/2011   Procedure: TRANSESOPHAGEAL ECHOCARDIOGRAM (TEE);  Surgeon: [name] Records, MD;  Location: [facility] ENDOSCOPY;  Service: Cardiovascular;  Laterality: N/A;  a-fib ablation following day  . TONSILLECTOMY     as a child  . TOTAL KNEE REVISION  08/08/2011   Procedure: TOTAL KNEE REVISION;  Surgeon: [name] Salen, MD;  Location: [facility];  Service: Orthopedics;  Laterality: Left;  DEPUY/SIGMA  . TRANSESOPHAGEAL ECHOCARDIOGRAM  04/2008  . TRANSTHORACIC ECHOCARDIOGRAM  2006,2009  . uvulectomy       Family History  Problem Relation Age of Onset  . Coronary artery disease Mother   . Cerebrovascular  Disease Mother   . Cancer Other   . Anesthesia problems Neg Hx   . Hypotension Neg Hx   . Malignant hyperthermia Neg Hx   . Pseudochol deficiency Neg Hx      Social History   Socioeconomic History  . Marital status: Married    Spouse name: Not on file  . Number of children: 2  . Years of education: Not on file  . Highest education level: Not on file  Occupational History  . Occupation: retired    Fish farm manager: RETIRED  Tobacco Use  . Smoking status: Never Smoker  . Smokeless tobacco: Never Used  Substance and Sexual Activity  . Alcohol use: Yes    Comment: rare use  . Drug use: No  . Sexual activity: Not on file  Other Topics Concern  . Not on file  Social History Narrative  . Not on file   Social Determinants of Health   Financial Resource Strain:   . Difficulty of Paying Living Expenses: Not on file  Food Insecurity:   . Worried About Charity fundraiser in the Last Year: Not on file  . Ran Out of Food in the Last Year: Not on file  Transportation Needs:   . Lack of Transportation (Medical): Not on file  . Lack of Transportation (Non-Medical): Not on file  Physical Activity:   . Days of Exercise per Week: Not on file  . Minutes of Exercise per Session: Not on file  Stress:   . Feeling of Stress : Not on file  Social Connections:   . Frequency of Communication with Friends and Family: Not on file  . Frequency of Social Gatherings with Friends and Family: Not on file  . Attends Religious Services: Not on file  . Active Member of Clubs or Organizations: Not on file  . Attends Archivist Meetings: Not on file  . Marital Status: Not on file  Intimate Partner Violence:   . Fear of Current or Ex-Partner: Not on file  . Emotionally Abused: Not on file  . Physically Abused: Not on file  . Sexually Abused: Not on file     BP 122/86   Pulse 81   Ht 5\' 7"  (1.702 m)   Wt 177 lb 1.9 oz (80.3 kg)   SpO2 98%   BMI 27.74 kg/m   Physical Exam:  Well  appearing NAD HEENT: Unremarkable Neck:  No JVD, no thyromegally Lymphatics:  No adenopathy Back:  No CVA tenderness Lungs:  Clear with no wheezes HEART:  Regular rate rhythm, no  murmurs, no rubs, no clicks Abd:  soft, positive bowel sounds, no organomegally, no rebound, no guarding Ext:  2 plus pulses, no edema, no cyanosis, no clubbing Skin:  No rashes no nodules Neuro:  CN II through XII intact, motor grossly intact  EKG - nsr with atrial pacing  DEVICE  Normal device function.  See PaceArt for details.   Assess/Plan: 1. Sinus node dysfunction - he is asymptomatic, s/p PPM insertion.  2. HTN - his bp has been well controlled. He has lost weight, over 15 lbs. His bp is well controlled. 3. PAF - he has maintained NSR 99% of the time. He will continue warfarin. 4. PPM - his medtronic DDD chamber is working normally except the atrial threshold up slightly.  Cristopher Peru, MD

## 2019-08-03 NOTE — Patient Instructions (Signed)
Medication Instructions:  Your physician recommends that you continue on your current medications as directed. Please refer to the Current Medication list given to you today.  Labwork: None ordered.  Testing/Procedures: None ordered.  Follow-Up: Your physician wants you to follow-up in: one year with Dr. Lovena Le.   You will receive a reminder letter in the mail two months in advance. If you don't receive a letter, please call our office to schedule the follow-up appointment.  Remote monitoring is used to monitor your Pacemaker from home. This monitoring reduces the number of office visits required to check your device to one time per year. It allows [place] to keep an eye on the functioning of your device to ensure it is working properly. You are scheduled for a device check from home on 08/18/2019. You may send your transmission at any time that day. If you have a wireless device, the transmission will be sent automatically. After your physician reviews your transmission, you will receive a postcard with your next transmission date.  Any Other Special Instructions Will Be Listed Below (If Applicable).  If you need a refill on your cardiac medications before your next appointment, please call your pharmacy.

## 2019-08-18 ENCOUNTER — Ambulatory Visit (INDEPENDENT_AMBULATORY_CARE_PROVIDER_SITE_OTHER): Payer: Medicare Other | Admitting: *Deleted

## 2019-08-18 DIAGNOSIS — Z950 Presence of cardiac pacemaker: Secondary | ICD-10-CM | POA: Diagnosis not present

## 2019-08-22 LAB — CUP PACEART REMOTE DEVICE CHECK
Battery Impedance: 820 Ohm
Battery Remaining Longevity: 71 mo
Battery Voltage: 2.79 V
Brady Statistic AP VP Percent: 0 %
Brady Statistic AP VS Percent: 99 %
Brady Statistic AS VP Percent: 0 %
Brady Statistic AS VS Percent: 1 %
Date Time Interrogation Session: 20210329060647
Implantable Lead Implant Date: 20070112
Implantable Lead Implant Date: 20070112
Implantable Lead Location: 753859
Implantable Lead Location: 753860
Implantable Lead Model: 5076
Implantable Lead Model: 5076
Implantable Pulse Generator Implant Date: 20131018
Lead Channel Impedance Value: 465 Ohm
Lead Channel Impedance Value: 575 Ohm
Lead Channel Pacing Threshold Amplitude: 0.5 V
Lead Channel Pacing Threshold Amplitude: 0.875 V
Lead Channel Pacing Threshold Pulse Width: 0.4 ms
Lead Channel Pacing Threshold Pulse Width: 0.4 ms
Lead Channel Setting Pacing Amplitude: 2 V
Lead Channel Setting Pacing Amplitude: 2.5 V
Lead Channel Setting Pacing Pulse Width: 0.34 ms
Lead Channel Setting Sensing Sensitivity: 2.8 mV

## 2019-08-22 NOTE — Progress Notes (Signed)
PPM Remote  

## 2019-08-29 DIAGNOSIS — I4891 Unspecified atrial fibrillation: Secondary | ICD-10-CM | POA: Diagnosis not present

## 2019-08-29 DIAGNOSIS — E559 Vitamin D deficiency, unspecified: Secondary | ICD-10-CM | POA: Diagnosis not present

## 2019-08-29 DIAGNOSIS — J302 Other seasonal allergic rhinitis: Secondary | ICD-10-CM | POA: Diagnosis not present

## 2019-09-06 ENCOUNTER — Ambulatory Visit (INDEPENDENT_AMBULATORY_CARE_PROVIDER_SITE_OTHER): Payer: Medicare Other | Admitting: *Deleted

## 2019-09-06 ENCOUNTER — Other Ambulatory Visit: Payer: Self-pay

## 2019-09-06 DIAGNOSIS — I4891 Unspecified atrial fibrillation: Secondary | ICD-10-CM | POA: Diagnosis not present

## 2019-09-06 DIAGNOSIS — Z7901 Long term (current) use of anticoagulants: Secondary | ICD-10-CM

## 2019-09-06 DIAGNOSIS — Z5181 Encounter for therapeutic drug level monitoring: Secondary | ICD-10-CM

## 2019-09-06 DIAGNOSIS — I480 Paroxysmal atrial fibrillation: Secondary | ICD-10-CM | POA: Diagnosis not present

## 2019-09-06 LAB — POCT INR: INR: 2.3 (ref 2.0–3.0)

## 2019-09-06 NOTE — Patient Instructions (Signed)
Description   Continue taking 1/2 tablet daily except 1 tablet on Sundays and Thursdays. Recheck INR in 5 weeks. Call with any questions [phone]

## 2019-10-11 ENCOUNTER — Other Ambulatory Visit: Payer: Self-pay

## 2019-10-11 ENCOUNTER — Ambulatory Visit (INDEPENDENT_AMBULATORY_CARE_PROVIDER_SITE_OTHER): Payer: Medicare Other | Admitting: *Deleted

## 2019-10-11 DIAGNOSIS — Z5181 Encounter for therapeutic drug level monitoring: Secondary | ICD-10-CM

## 2019-10-11 DIAGNOSIS — I480 Paroxysmal atrial fibrillation: Secondary | ICD-10-CM

## 2019-10-11 DIAGNOSIS — Z7901 Long term (current) use of anticoagulants: Secondary | ICD-10-CM

## 2019-10-11 DIAGNOSIS — I4891 Unspecified atrial fibrillation: Secondary | ICD-10-CM | POA: Diagnosis not present

## 2019-10-11 LAB — POCT INR: INR: 2.4 (ref 2.0–3.0)

## 2019-10-11 NOTE — Patient Instructions (Signed)
Description   Continue taking 1/2 tablet daily except 1 tablet on Sundays and Thursdays. Recheck INR in 6 weeks. Call with any questions [phone]

## 2019-10-25 ENCOUNTER — Other Ambulatory Visit: Payer: Self-pay | Admitting: Internal Medicine

## 2019-11-17 ENCOUNTER — Ambulatory Visit: Payer: Medicare Other

## 2019-11-18 ENCOUNTER — Other Ambulatory Visit: Payer: Self-pay | Admitting: Internal Medicine

## 2019-11-18 ENCOUNTER — Telehealth: Payer: Self-pay

## 2019-11-18 NOTE — Telephone Encounter (Signed)
Spoke with patient to remind of missed remote transmission 

## 2019-11-20 LAB — CUP PACEART REMOTE DEVICE CHECK
Battery Impedance: 896 Ohm
Battery Remaining Longevity: 68 mo
Battery Voltage: 2.79 V
Brady Statistic AP VP Percent: 0 %
Brady Statistic AP VS Percent: 97 %
Brady Statistic AS VP Percent: 0 %
Brady Statistic AS VS Percent: 3 %
Date Time Interrogation Session: 20210625194008
Implantable Lead Implant Date: 20070112
Implantable Lead Implant Date: 20070112
Implantable Lead Location: 753859
Implantable Lead Location: 753860
Implantable Lead Model: 5076
Implantable Lead Model: 5076
Implantable Pulse Generator Implant Date: 20131018
Lead Channel Impedance Value: 486 Ohm
Lead Channel Impedance Value: 516 Ohm
Lead Channel Pacing Threshold Amplitude: 0.5 V
Lead Channel Pacing Threshold Amplitude: 1 V
Lead Channel Pacing Threshold Pulse Width: 0.4 ms
Lead Channel Pacing Threshold Pulse Width: 0.4 ms
Lead Channel Setting Pacing Amplitude: 2 V
Lead Channel Setting Pacing Amplitude: 2.5 V
Lead Channel Setting Pacing Pulse Width: 0.34 ms
Lead Channel Setting Sensing Sensitivity: 2.8 mV

## 2019-11-21 ENCOUNTER — Telehealth: Payer: Self-pay

## 2019-11-21 NOTE — Telephone Encounter (Signed)
Manual transmission received, pt out of Afl.  It appears episode on Friday was apprx 2 hours long.    Spoke with pt. He denies any current cardiac symptoms and confirmed compliance with meds as ordered.    6/28 presenting

## 2019-11-21 NOTE — Telephone Encounter (Signed)
Scheduled remote transmission received- Ongoing Afl with vrates 90-140.  Pt has history of known PAF on Coumadin for [place].  Current meds include propranolol 20mg  TID.    Spoke with pt spouse (DPR on file), pt not avail at time of call.  Advised transmission on 6/25 showed ongoing Afl, she indicates he felt fine before doing transmission but then noted by time transmission complete he felt "funny"  Request she have him send another transmission this AM to determine if pt out of rhythm and duration.  Will f/u with pt after transmission received.    11/18/19- Presenting rhythm

## 2019-11-22 ENCOUNTER — Other Ambulatory Visit: Payer: Self-pay

## 2019-11-22 ENCOUNTER — Ambulatory Visit (INDEPENDENT_AMBULATORY_CARE_PROVIDER_SITE_OTHER): Payer: Medicare Other | Admitting: Pharmacist

## 2019-11-22 DIAGNOSIS — Z7901 Long term (current) use of anticoagulants: Secondary | ICD-10-CM | POA: Diagnosis not present

## 2019-11-22 DIAGNOSIS — Z5181 Encounter for therapeutic drug level monitoring: Secondary | ICD-10-CM

## 2019-11-22 DIAGNOSIS — I480 Paroxysmal atrial fibrillation: Secondary | ICD-10-CM

## 2019-11-22 DIAGNOSIS — I4891 Unspecified atrial fibrillation: Secondary | ICD-10-CM

## 2019-11-22 LAB — POCT INR: INR: 2.9 (ref 2.0–3.0)

## 2019-11-22 NOTE — Patient Instructions (Signed)
Continue taking 1/2 tablet daily except 1 tablet on Sundays and Thursdays. Recheck INR in 6 weeks. Call with any questions [phone]

## 2019-12-07 DIAGNOSIS — Z125 Encounter for screening for malignant neoplasm of prostate: Secondary | ICD-10-CM | POA: Diagnosis not present

## 2019-12-15 DIAGNOSIS — Z125 Encounter for screening for malignant neoplasm of prostate: Secondary | ICD-10-CM | POA: Diagnosis not present

## 2019-12-15 DIAGNOSIS — R3915 Urgency of urination: Secondary | ICD-10-CM | POA: Diagnosis not present

## 2019-12-15 DIAGNOSIS — N401 Enlarged prostate with lower urinary tract symptoms: Secondary | ICD-10-CM | POA: Diagnosis not present

## 2020-01-03 ENCOUNTER — Ambulatory Visit (INDEPENDENT_AMBULATORY_CARE_PROVIDER_SITE_OTHER): Payer: Medicare Other | Admitting: *Deleted

## 2020-01-03 ENCOUNTER — Other Ambulatory Visit: Payer: Self-pay

## 2020-01-03 DIAGNOSIS — I4891 Unspecified atrial fibrillation: Secondary | ICD-10-CM | POA: Diagnosis not present

## 2020-01-03 DIAGNOSIS — Z5181 Encounter for therapeutic drug level monitoring: Secondary | ICD-10-CM

## 2020-01-03 DIAGNOSIS — I480 Paroxysmal atrial fibrillation: Secondary | ICD-10-CM | POA: Diagnosis not present

## 2020-01-03 DIAGNOSIS — Z7901 Long term (current) use of anticoagulants: Secondary | ICD-10-CM

## 2020-01-03 LAB — POCT INR: INR: 2.7 (ref 2.0–3.0)

## 2020-01-03 NOTE — Patient Instructions (Signed)
Description   Continue taking 1/2 tablet daily except 1 tablet on Sundays and Thursdays. Recheck INR in 8 weeks. Call with any questions [phone]

## 2020-01-13 DIAGNOSIS — X32XXXD Exposure to sunlight, subsequent encounter: Secondary | ICD-10-CM | POA: Diagnosis not present

## 2020-01-13 DIAGNOSIS — L570 Actinic keratosis: Secondary | ICD-10-CM | POA: Diagnosis not present

## 2020-01-31 DIAGNOSIS — M25551 Pain in right hip: Secondary | ICD-10-CM | POA: Diagnosis not present

## 2020-01-31 DIAGNOSIS — Z96641 Presence of right artificial hip joint: Secondary | ICD-10-CM | POA: Diagnosis not present

## 2020-02-02 ENCOUNTER — Other Ambulatory Visit (HOSPITAL_COMMUNITY): Payer: Self-pay | Admitting: Orthopedic Surgery

## 2020-02-02 ENCOUNTER — Other Ambulatory Visit: Payer: Self-pay | Admitting: Orthopedic Surgery

## 2020-02-02 DIAGNOSIS — M25551 Pain in right hip: Secondary | ICD-10-CM

## 2020-02-02 DIAGNOSIS — Z96641 Presence of right artificial hip joint: Secondary | ICD-10-CM

## 2020-02-09 ENCOUNTER — Encounter (HOSPITAL_COMMUNITY)
Admission: RE | Admit: 2020-02-09 | Discharge: 2020-02-09 | Disposition: A | Discharge status: Home / Self Care | Payer: Medicare Other | Source: Ambulatory Visit | Attending: Orthopedic Surgery | Admitting: Orthopedic Surgery

## 2020-02-09 DIAGNOSIS — M25551 Pain in right hip: Secondary | ICD-10-CM | POA: Diagnosis not present

## 2020-02-09 DIAGNOSIS — Z96641 Presence of right artificial hip joint: Secondary | ICD-10-CM | POA: Insufficient documentation

## 2020-02-09 MED ORDER — TECHNETIUM TC 99M MEDRONATE IV KIT
20.0000 | PACK | Freq: Once | INTRAVENOUS | Status: AC | PRN
  Administered 2020-02-09: 20 via INTRAVENOUS

## 2020-02-13 DIAGNOSIS — M7061 Trochanteric bursitis, right hip: Secondary | ICD-10-CM | POA: Diagnosis not present

## 2020-02-14 DIAGNOSIS — M7061 Trochanteric bursitis, right hip: Secondary | ICD-10-CM | POA: Diagnosis not present

## 2020-02-16 ENCOUNTER — Ambulatory Visit (INDEPENDENT_AMBULATORY_CARE_PROVIDER_SITE_OTHER): Payer: Medicare Other | Admitting: Emergency Medicine

## 2020-02-16 DIAGNOSIS — I495 Sick sinus syndrome: Secondary | ICD-10-CM

## 2020-02-17 LAB — CUP PACEART REMOTE DEVICE CHECK
Battery Impedance: 947 Ohm
Battery Remaining Longevity: 66 mo
Battery Voltage: 2.79 V
Brady Statistic AP VP Percent: 0 %
Brady Statistic AP VS Percent: 98 %
Brady Statistic AS VP Percent: 0 %
Brady Statistic AS VS Percent: 2 %
Date Time Interrogation Session: 20210924133755
Implantable Lead Implant Date: 20070112
Implantable Lead Implant Date: 20070112
Implantable Lead Location: 753859
Implantable Lead Location: 753860
Implantable Lead Model: 5076
Implantable Lead Model: 5076
Implantable Pulse Generator Implant Date: 20131018
Lead Channel Impedance Value: 500 Ohm
Lead Channel Impedance Value: 546 Ohm
Lead Channel Pacing Threshold Amplitude: 0.5 V
Lead Channel Pacing Threshold Amplitude: 1.125 V
Lead Channel Pacing Threshold Pulse Width: 0.4 ms
Lead Channel Pacing Threshold Pulse Width: 0.4 ms
Lead Channel Setting Pacing Amplitude: 2 V
Lead Channel Setting Pacing Amplitude: 2.5 V
Lead Channel Setting Pacing Pulse Width: 0.34 ms
Lead Channel Setting Sensing Sensitivity: 2.8 mV

## 2020-02-21 DIAGNOSIS — M7061 Trochanteric bursitis, right hip: Secondary | ICD-10-CM | POA: Diagnosis not present

## 2020-02-21 NOTE — Progress Notes (Signed)
Remote pacemaker transmission.   

## 2020-02-23 DIAGNOSIS — M25561 Pain in right knee: Secondary | ICD-10-CM | POA: Diagnosis not present

## 2020-02-27 ENCOUNTER — Ambulatory Visit (INDEPENDENT_AMBULATORY_CARE_PROVIDER_SITE_OTHER): Payer: Medicare Other | Admitting: *Deleted

## 2020-02-27 ENCOUNTER — Other Ambulatory Visit: Payer: Self-pay

## 2020-02-27 DIAGNOSIS — Z5181 Encounter for therapeutic drug level monitoring: Secondary | ICD-10-CM | POA: Diagnosis not present

## 2020-02-27 DIAGNOSIS — Z7901 Long term (current) use of anticoagulants: Secondary | ICD-10-CM

## 2020-02-27 DIAGNOSIS — I4891 Unspecified atrial fibrillation: Secondary | ICD-10-CM

## 2020-02-27 DIAGNOSIS — M7061 Trochanteric bursitis, right hip: Secondary | ICD-10-CM | POA: Diagnosis not present

## 2020-02-27 LAB — POCT INR: INR: 4.4 — AB (ref 2.0–3.0)

## 2020-02-27 NOTE — Patient Instructions (Signed)
Description   Hold warfarin 10/5 and 10/6 then continue taking 1/2 tablet daily except 1 tablet on Sundays and Thursdays. Recheck INR in 2 weeks. Call with any questions [phone]

## 2020-02-28 DIAGNOSIS — I4891 Unspecified atrial fibrillation: Secondary | ICD-10-CM | POA: Diagnosis not present

## 2020-02-28 DIAGNOSIS — F329 Major depressive disorder, single episode, unspecified: Secondary | ICD-10-CM | POA: Diagnosis not present

## 2020-02-28 DIAGNOSIS — J302 Other seasonal allergic rhinitis: Secondary | ICD-10-CM | POA: Diagnosis not present

## 2020-02-28 DIAGNOSIS — E559 Vitamin D deficiency, unspecified: Secondary | ICD-10-CM | POA: Diagnosis not present

## 2020-02-29 DIAGNOSIS — M7061 Trochanteric bursitis, right hip: Secondary | ICD-10-CM | POA: Diagnosis not present

## 2020-03-05 DIAGNOSIS — Z23 Encounter for immunization: Secondary | ICD-10-CM | POA: Diagnosis not present

## 2020-03-06 DIAGNOSIS — M7061 Trochanteric bursitis, right hip: Secondary | ICD-10-CM | POA: Diagnosis not present

## 2020-03-08 DIAGNOSIS — M7061 Trochanteric bursitis, right hip: Secondary | ICD-10-CM | POA: Diagnosis not present

## 2020-03-12 ENCOUNTER — Ambulatory Visit (INDEPENDENT_AMBULATORY_CARE_PROVIDER_SITE_OTHER): Payer: Medicare Other | Admitting: *Deleted

## 2020-03-12 ENCOUNTER — Other Ambulatory Visit: Payer: Self-pay

## 2020-03-12 DIAGNOSIS — M7061 Trochanteric bursitis, right hip: Secondary | ICD-10-CM | POA: Diagnosis not present

## 2020-03-12 DIAGNOSIS — Z5181 Encounter for therapeutic drug level monitoring: Secondary | ICD-10-CM

## 2020-03-12 DIAGNOSIS — Z7901 Long term (current) use of anticoagulants: Secondary | ICD-10-CM

## 2020-03-12 DIAGNOSIS — I4891 Unspecified atrial fibrillation: Secondary | ICD-10-CM | POA: Diagnosis not present

## 2020-03-12 DIAGNOSIS — I480 Paroxysmal atrial fibrillation: Secondary | ICD-10-CM

## 2020-03-12 LAB — POCT INR: INR: 2.8 (ref 2.0–3.0)

## 2020-03-12 NOTE — Patient Instructions (Addendum)
Description   Continue taking Warfarin 1/2 tablet daily except 1 tablet on Sundays and Thursdays. Recheck INR in 4 weeks. Call with any questions [phone]

## 2020-03-14 DIAGNOSIS — M7061 Trochanteric bursitis, right hip: Secondary | ICD-10-CM | POA: Diagnosis not present

## 2020-03-19 ENCOUNTER — Other Ambulatory Visit: Payer: Self-pay | Admitting: Internal Medicine

## 2020-03-19 ENCOUNTER — Other Ambulatory Visit: Payer: Self-pay | Admitting: Orthopedic Surgery

## 2020-03-19 DIAGNOSIS — M5416 Radiculopathy, lumbar region: Secondary | ICD-10-CM

## 2020-03-19 DIAGNOSIS — M25561 Pain in right knee: Secondary | ICD-10-CM | POA: Diagnosis not present

## 2020-03-19 DIAGNOSIS — M545 Low back pain, unspecified: Secondary | ICD-10-CM | POA: Diagnosis not present

## 2020-03-19 DIAGNOSIS — M25562 Pain in left knee: Secondary | ICD-10-CM | POA: Diagnosis not present

## 2020-03-20 ENCOUNTER — Telehealth: Payer: Self-pay | Admitting: Nurse Practitioner

## 2020-03-20 NOTE — Telephone Encounter (Signed)
Phone call to patient to review instructions for 13 hr prep for a Myelogram CT w/ contrast on 03/28/20 at 1030. Prescription called into [facility]. Pt aware and verbalized understanding of instructions. Pt's allergies discussed with Dr. Nelson Chimes, and prescription called in according to his request. Prescription 2130- 50mg  Prednisone 0330- 50mg  Prednisone 0930- 50mg  Prednisone and 50mg  Benadryl

## 2020-03-20 NOTE — Telephone Encounter (Signed)
Phone call to patient to verify medication list and allergies for myelogram procedure. Pt instructed to hold prozac for 48hrs prior to myelogram appointment time. Pt verbalized understanding. Pre and post procedure instructions reviewed with pt.

## 2020-03-24 ENCOUNTER — Other Ambulatory Visit: Payer: Self-pay | Admitting: Internal Medicine

## 2020-03-26 DIAGNOSIS — M79604 Pain in right leg: Secondary | ICD-10-CM | POA: Diagnosis not present

## 2020-03-26 DIAGNOSIS — M2560 Stiffness of unspecified joint, not elsewhere classified: Secondary | ICD-10-CM | POA: Diagnosis not present

## 2020-03-26 DIAGNOSIS — M545 Low back pain, unspecified: Secondary | ICD-10-CM | POA: Diagnosis not present

## 2020-03-26 DIAGNOSIS — M25551 Pain in right hip: Secondary | ICD-10-CM | POA: Diagnosis not present

## 2020-03-28 ENCOUNTER — Ambulatory Visit
Admission: RE | Admit: 2020-03-28 | Discharge: 2020-03-28 | Disposition: A | Discharge status: Home / Self Care | Payer: Medicare Other | Source: Ambulatory Visit | Attending: Orthopedic Surgery | Admitting: Orthopedic Surgery

## 2020-03-28 DIAGNOSIS — M48061 Spinal stenosis, lumbar region without neurogenic claudication: Secondary | ICD-10-CM | POA: Diagnosis not present

## 2020-03-28 DIAGNOSIS — M47816 Spondylosis without myelopathy or radiculopathy, lumbar region: Secondary | ICD-10-CM | POA: Diagnosis not present

## 2020-03-28 DIAGNOSIS — M5416 Radiculopathy, lumbar region: Secondary | ICD-10-CM

## 2020-03-28 MED ORDER — IOPAMIDOL (ISOVUE-M 200) INJECTION 41%
20.0000 mL | Freq: Once | INTRAMUSCULAR | Status: AC
  Administered 2020-03-28: 20 mL via INTRATHECAL

## 2020-03-28 MED ORDER — DIAZEPAM 5 MG PO TABS
5.0000 mg | ORAL_TABLET | Freq: Once | ORAL | Status: AC
  Administered 2020-03-28: 5 mg via ORAL

## 2020-03-28 NOTE — Discharge Instructions (Signed)
Myelogram Discharge Instructions  1. Go home and rest quietly for the next 24 hours.  It is important to lie flat for the next 24 hours.  Get up only to go to the restroom.  You may lie in the bed or on a couch on your back, your stomach, your left side or your right side.  You may have one pillow under your head.  You may have pillows between your knees while you are on your side or under your knees while you are on your back.  2. DO NOT drive today.  Recline the seat as far back as it will go, while still wearing your seat belt, on the way home.  3. You may get up to go to the bathroom as needed.  You may sit up for 10 minutes to eat.  You may resume your normal diet and medications unless otherwise indicated.  Drink plenty of extra fluids today and tomorrow.  4. The incidence of a spinal headache with nausea and/or vomiting is about 5% (one in 20 patients).  If you develop a headache, lie flat and drink plenty of fluids until the headache goes away.  Caffeinated beverages may be helpful.  If you develop severe nausea and vomiting or a headache that does not go away with flat bed rest, call [phone].  5. You may resume normal activities after your 24 hours of bed rest is over; however, do not exert yourself strongly or do any heavy lifting tomorrow.  Call your physician for a follow-up appointment.   YOU MAY RESUME YOUR PROZAC TOMORROW 03/29/20 AT 1030 AM.

## 2020-03-28 NOTE — Progress Notes (Signed)
Patient states he has been off Prozac for at least the past two days and has taken his 13-hour prep as prescribed.

## 2020-03-30 DIAGNOSIS — M545 Low back pain, unspecified: Secondary | ICD-10-CM | POA: Diagnosis not present

## 2020-04-02 ENCOUNTER — Telehealth: Payer: Self-pay | Admitting: *Deleted

## 2020-04-02 NOTE — Telephone Encounter (Signed)
   [facility] Pre-operative Risk Assessment    HEARTCARE STAFF: - Please ensure there is not already an duplicate clearance open for this procedure. - Under Visit Info/Reason for Call, type in Other and utilize the format Clearance MM/DD/YY or Clearance TBD. Do not use dashes or single digits. - If request is for dental extraction, please clarify the # of teeth to be extracted.  Request for surgical clearance:  1. What type of surgery is being performed?  R L5-S1, TFESI   2. When is this surgery scheduled?  TBD   3. What type of clearance is required (medical clearance vs. Pharmacy clearance to hold med vs. Both)?  BOTH  4. Are there any medications that need to be held prior to surgery and how long? COUMADIN   5. Practice name and name of physician performing surgery?  [name] / DR. Thedore Mins   6. What is the office phone number?  [phone]   7.   What is the office fax number?  [phone]  8.   Anesthesia type (None, local, MAC, general) ?     Jeanann Lewandowsky 04/02/2020, 11:52 AM  _________________________________________________________________   (provider comments below)

## 2020-04-02 NOTE — Telephone Encounter (Signed)
Patient with diagnosis of afib on warfarin for anticoagulation.    Procedure:   R L5-S1, TFESI  Date of procedure: TBD    CHA2DS2-VASc Score = 3  This indicates a 3.2% annual risk of stroke. The patient's score is based upon: CHF History: 0 HTN History: 1 Diabetes History: 0 Stroke History: 0 Vascular Disease History: 0 Age Score: 2 Gender Score: 0     Per office protocol, patient can hold warfarin for 5 days prior to procedure.    Patient will NOT need bridging with Lovenox (enoxaparin) around procedure.

## 2020-04-03 NOTE — Telephone Encounter (Signed)
   Primary Cardiologist: Cristopher Peru, MD  Chart reviewed as part of pre-operative protocol coverage. Patient was contacted 04/03/2020 in reference to pre-operative risk assessment for pending surgery as outlined below.  [name] [name] was last seen on 08/03/19 by Dr. Lovena Le.  Since that day, [name] [name] has done well from a cardiac standpoint. He exercises daily and can complete 4 METs without anginal complaints.   Therefore, based on ACC/AHA guidelines, the patient would be at acceptable risk for the planned procedure without further cardiovascular testing.   The patient was advised that if he develops new symptoms prior to surgery to contact our office to arrange for a follow-up visit, and he verbalized understanding.  Per pharmacy recommendations, patient can hold coumadin 5 days prior to his upcoming spinal injection and should restart coumadin when cleared to do so by his surgeon.  I will route this recommendation to the requesting party via Epic fax function and remove from pre-op pool. Please call with questions.  Abigail Butts, PA-C 04/03/2020, 10:57 AM

## 2020-04-06 DIAGNOSIS — M545 Low back pain, unspecified: Secondary | ICD-10-CM | POA: Diagnosis not present

## 2020-04-06 DIAGNOSIS — M25551 Pain in right hip: Secondary | ICD-10-CM | POA: Diagnosis not present

## 2020-04-06 DIAGNOSIS — M79604 Pain in right leg: Secondary | ICD-10-CM | POA: Diagnosis not present

## 2020-04-06 DIAGNOSIS — M2560 Stiffness of unspecified joint, not elsewhere classified: Secondary | ICD-10-CM | POA: Diagnosis not present

## 2020-04-06 NOTE — Telephone Encounter (Signed)
Marcene Brawn is calling stating she spoke with Francee Piccolo and he advised them he has been cleared for surgery, but they have not received the fax and are requesting it be resent. Please advise.

## 2020-04-09 ENCOUNTER — Ambulatory Visit (INDEPENDENT_AMBULATORY_CARE_PROVIDER_SITE_OTHER): Payer: Medicare Other | Admitting: Pharmacy Technician

## 2020-04-09 ENCOUNTER — Other Ambulatory Visit: Payer: Self-pay

## 2020-04-09 DIAGNOSIS — I4891 Unspecified atrial fibrillation: Secondary | ICD-10-CM | POA: Diagnosis not present

## 2020-04-09 DIAGNOSIS — M2560 Stiffness of unspecified joint, not elsewhere classified: Secondary | ICD-10-CM | POA: Diagnosis not present

## 2020-04-09 DIAGNOSIS — M545 Low back pain, unspecified: Secondary | ICD-10-CM | POA: Diagnosis not present

## 2020-04-09 DIAGNOSIS — Z5181 Encounter for therapeutic drug level monitoring: Secondary | ICD-10-CM | POA: Diagnosis not present

## 2020-04-09 DIAGNOSIS — Z7901 Long term (current) use of anticoagulants: Secondary | ICD-10-CM

## 2020-04-09 DIAGNOSIS — I480 Paroxysmal atrial fibrillation: Secondary | ICD-10-CM | POA: Diagnosis not present

## 2020-04-09 DIAGNOSIS — M79604 Pain in right leg: Secondary | ICD-10-CM | POA: Diagnosis not present

## 2020-04-09 LAB — POCT INR: INR: 3.2 — AB (ref 2.0–3.0)

## 2020-04-09 NOTE — Patient Instructions (Signed)
Description   Take 1/2 tablet today, then continue taking Warfarin 1/2 tablet daily except 1 tablet on Sundays and Thursdays. Hold your warfarin prior to your procedure for 5 days then resume your warfarin the night of your procedure or as directed by your doctor. Recheck INR 1 week after resuming warfarin. Call with any questions [phone]

## 2020-04-13 DIAGNOSIS — M25551 Pain in right hip: Secondary | ICD-10-CM | POA: Diagnosis not present

## 2020-04-13 DIAGNOSIS — M545 Low back pain, unspecified: Secondary | ICD-10-CM | POA: Diagnosis not present

## 2020-04-13 DIAGNOSIS — M2560 Stiffness of unspecified joint, not elsewhere classified: Secondary | ICD-10-CM | POA: Diagnosis not present

## 2020-04-13 DIAGNOSIS — M79604 Pain in right leg: Secondary | ICD-10-CM | POA: Diagnosis not present

## 2020-04-16 DIAGNOSIS — M5416 Radiculopathy, lumbar region: Secondary | ICD-10-CM | POA: Diagnosis not present

## 2020-04-23 ENCOUNTER — Other Ambulatory Visit: Payer: Self-pay

## 2020-04-23 ENCOUNTER — Ambulatory Visit (INDEPENDENT_AMBULATORY_CARE_PROVIDER_SITE_OTHER): Payer: Medicare Other

## 2020-04-23 ENCOUNTER — Telehealth: Payer: Self-pay

## 2020-04-23 DIAGNOSIS — I4811 Longstanding persistent atrial fibrillation: Secondary | ICD-10-CM | POA: Diagnosis not present

## 2020-04-23 DIAGNOSIS — Z7901 Long term (current) use of anticoagulants: Secondary | ICD-10-CM

## 2020-04-23 DIAGNOSIS — Z5181 Encounter for therapeutic drug level monitoring: Secondary | ICD-10-CM | POA: Diagnosis not present

## 2020-04-23 DIAGNOSIS — Z950 Presence of cardiac pacemaker: Secondary | ICD-10-CM

## 2020-04-23 DIAGNOSIS — I4891 Unspecified atrial fibrillation: Secondary | ICD-10-CM

## 2020-04-23 DIAGNOSIS — Z23 Encounter for immunization: Secondary | ICD-10-CM | POA: Diagnosis not present

## 2020-04-23 LAB — POCT INR: INR: 3.1 — AB (ref 2.0–3.0)

## 2020-04-23 NOTE — Telephone Encounter (Signed)
Atrial fib is not new for this patient. GT

## 2020-04-23 NOTE — Telephone Encounter (Signed)
Pt was seen in Coumadin Clinic today.  Pt presents requesting to speak with Dr Tanna Furry nurse.  I advised pt I would try and help him get his issues/problems addressed. Pt is aware Dr Lovena Le is not in the office today, but per Sonia Baller, RN he will be in the office tomorrow and she will have him address this message.   1. Pt states his orthopedic MD Dr Doreatha Martin at [facility] [phone] is requesting Dr Tanna Furry OK to rx Meloxicam 15mg  one tablet Qam with meals for L knee pain.  Pt states he does not take daily, averages 2-3 tablets per week, but he is not taking any other pain medications and this helps to relieve his knee pain when it flares up.  Could someone please contact their office and approve so they can rx? 2. Pt also reports a dizzy/syncopal episode on Thanksgiving day 04/19/20 from 11:30am-approx 3:00pm where he was very dizzy almost to the point of passing out and he reports not remembering much of the lunch discussions that happened during that time.  Pt has not had any further episodes like this since that date.  Patient is concerned, please advise. Thanks Dissussed questions with Sonia Baller, RN while pt was in office, she will have Dr Lovena Le address when in office tomorrow and call back.  Pt is agreeable to await Dr Tanna Furry response tomorrow and thanked me for my help.

## 2020-04-23 NOTE — Telephone Encounter (Signed)
Spoke with patient, requested he send manual transmission.  At time mentioned, it appears pt was in AF from about 7:15am until around 1:30pm.

## 2020-04-23 NOTE — Patient Instructions (Signed)
Description   Start taking Warfarin 1/2 tablet daily except 1 tablet on Sundays.  Recheck INR 1 week after resuming warfarin. Call with any questions [phone]

## 2020-04-24 NOTE — Telephone Encounter (Signed)
Outreach made to Dr. Lenis Noon office.  Spoke with Dr. Lenis Noon nurse.  Advised per Dr. Lovena Le ok to refill Pt's meloxicam.  Nurse states she will send Dr. Thedore Mins a message.  Call placed to Pt.  Left detailed message per DPR.  1.  Advised contacted orthopedic doctor and approved meloxicam refill.  2.  Advised per Dr. Lovena Le- if Pt becomes dizzy he should lay down until dizziness resolves.  (Also advised Pt should check his blood pressure during any dizzy episodes)  Advised to call office if any further needs.

## 2020-04-26 ENCOUNTER — Encounter: Payer: Self-pay | Admitting: Internal Medicine

## 2020-04-26 NOTE — Telephone Encounter (Signed)
error 

## 2020-05-02 DIAGNOSIS — M25551 Pain in right hip: Secondary | ICD-10-CM | POA: Diagnosis not present

## 2020-05-09 DIAGNOSIS — F331 Major depressive disorder, recurrent, moderate: Secondary | ICD-10-CM | POA: Diagnosis not present

## 2020-05-14 ENCOUNTER — Other Ambulatory Visit: Payer: Self-pay

## 2020-05-14 ENCOUNTER — Ambulatory Visit (INDEPENDENT_AMBULATORY_CARE_PROVIDER_SITE_OTHER): Payer: Medicare Other

## 2020-05-14 DIAGNOSIS — Z5181 Encounter for therapeutic drug level monitoring: Secondary | ICD-10-CM | POA: Diagnosis not present

## 2020-05-14 DIAGNOSIS — Z7901 Long term (current) use of anticoagulants: Secondary | ICD-10-CM

## 2020-05-14 DIAGNOSIS — I4891 Unspecified atrial fibrillation: Secondary | ICD-10-CM

## 2020-05-14 LAB — POCT INR: INR: 1.7 — AB (ref 2.0–3.0)

## 2020-05-14 NOTE — Patient Instructions (Signed)
Description   Take 1 tablet today, then start taking  Warfarin 1/2 tablet daily except 1 tablet on Sundays and Thursdays.  Recheck in 4 weeks. Call with any questions [phone]

## 2020-05-17 ENCOUNTER — Ambulatory Visit (INDEPENDENT_AMBULATORY_CARE_PROVIDER_SITE_OTHER): Payer: Medicare Other

## 2020-05-17 DIAGNOSIS — I495 Sick sinus syndrome: Secondary | ICD-10-CM

## 2020-05-20 LAB — CUP PACEART REMOTE DEVICE CHECK
Battery Impedance: 999 Ohm
Battery Remaining Longevity: 63 mo
Battery Voltage: 2.79 V
Brady Statistic AP VP Percent: 0 %
Brady Statistic AP VS Percent: 98 %
Brady Statistic AS VP Percent: 0 %
Brady Statistic AS VS Percent: 2 %
Date Time Interrogation Session: 20211224082721
Implantable Lead Implant Date: 20070112
Implantable Lead Implant Date: 20070112
Implantable Lead Location: 753859
Implantable Lead Location: 753860
Implantable Lead Model: 5076
Implantable Lead Model: 5076
Implantable Pulse Generator Implant Date: 20131018
Lead Channel Impedance Value: 454 Ohm
Lead Channel Impedance Value: 520 Ohm
Lead Channel Pacing Threshold Amplitude: 0.375 V
Lead Channel Pacing Threshold Amplitude: 1 V
Lead Channel Pacing Threshold Pulse Width: 0.4 ms
Lead Channel Pacing Threshold Pulse Width: 0.4 ms
Lead Channel Setting Pacing Amplitude: 2 V
Lead Channel Setting Pacing Amplitude: 2.5 V
Lead Channel Setting Pacing Pulse Width: 0.34 ms
Lead Channel Setting Sensing Sensitivity: 2.8 mV

## 2020-05-31 NOTE — Progress Notes (Signed)
Remote pacemaker transmission.   

## 2020-06-18 ENCOUNTER — Telehealth: Payer: Self-pay | Admitting: Internal Medicine

## 2020-06-18 ENCOUNTER — Ambulatory Visit (INDEPENDENT_AMBULATORY_CARE_PROVIDER_SITE_OTHER): Payer: Medicare Other | Admitting: *Deleted

## 2020-06-18 ENCOUNTER — Other Ambulatory Visit: Payer: Self-pay

## 2020-06-18 DIAGNOSIS — Z7901 Long term (current) use of anticoagulants: Secondary | ICD-10-CM

## 2020-06-18 DIAGNOSIS — Z5181 Encounter for therapeutic drug level monitoring: Secondary | ICD-10-CM | POA: Diagnosis not present

## 2020-06-18 DIAGNOSIS — I4891 Unspecified atrial fibrillation: Secondary | ICD-10-CM

## 2020-06-18 LAB — POCT INR: INR: 1.9 — AB (ref 2.0–3.0)

## 2020-06-18 MED ORDER — LOSARTAN POTASSIUM 25 MG PO TABS: ORAL_TABLET | ORAL | 3 refills | Status: DC

## 2020-06-18 NOTE — Telephone Encounter (Signed)
Per review of coumadin appt note-Pt reports taking losartan 25 mg QOD PO instead of losartan 50 mg QD as prescribed.  Per Pt he has felt much better on this new routine.  Per Dr. Corliss Parish to continue PER patient PREFERENCE  Medication updated.  Pt has follow up scheduled.  No further action needed.

## 2020-06-18 NOTE — Patient Instructions (Addendum)
Description   Take 1 tablet today, then continue taking Warfarin 1/2 tablet daily except 1 tablet on Sundays and Thursdays. Please resume normal green intake. Recheck in 3 weeks. Call with any questions [phone]

## 2020-06-18 NOTE — Telephone Encounter (Signed)
Patient called and wanted to talk with doctor or nurse. Please call

## 2020-07-09 ENCOUNTER — Ambulatory Visit (INDEPENDENT_AMBULATORY_CARE_PROVIDER_SITE_OTHER): Payer: Medicare Other | Admitting: Pharmacist

## 2020-07-09 DIAGNOSIS — Z7901 Long term (current) use of anticoagulants: Secondary | ICD-10-CM | POA: Diagnosis not present

## 2020-07-09 DIAGNOSIS — I4891 Unspecified atrial fibrillation: Secondary | ICD-10-CM | POA: Diagnosis not present

## 2020-07-09 DIAGNOSIS — Z5181 Encounter for therapeutic drug level monitoring: Secondary | ICD-10-CM | POA: Diagnosis not present

## 2020-07-09 LAB — POCT INR: INR: 3.1 — AB (ref 2.0–3.0)

## 2020-07-09 NOTE — Patient Instructions (Signed)
Description   Eat a serving of greens today. Continue taking Warfarin 1/2 tablet daily except 1 tablet on Sundays and Thursdays. Please resume normal green intake. Recheck in 3 weeks. Call with any questions [phone]

## 2020-07-23 ENCOUNTER — Telehealth: Payer: Self-pay | Admitting: Internal Medicine

## 2020-07-23 NOTE — Telephone Encounter (Signed)
Pt c/o medication issue:  1. Name of Medication: warfarin (COUMADIN) 5 MG tablet  2. How are you currently taking this medication (dosage and times per day)? 1 tablet on thursdays and sundays, half one the other days  3. Are you having a reaction (difficulty breathing--STAT)? no  4. What is your medication issue? Patient would like to get off of warfarin and get on something else so he won't have to follow up with the coumadin clinic.

## 2020-07-23 NOTE — Telephone Encounter (Signed)
Spoke with patient- ok to switch to DOAC. He will need to keep coumadin clinic apt so we can check his INR and get his switched over. Pt is in agreement. He will keep his 3/7 apt. He was appreciative of the call.

## 2020-07-30 ENCOUNTER — Ambulatory Visit (INDEPENDENT_AMBULATORY_CARE_PROVIDER_SITE_OTHER): Payer: Medicare Other | Admitting: *Deleted

## 2020-07-30 ENCOUNTER — Other Ambulatory Visit: Payer: Self-pay

## 2020-07-30 DIAGNOSIS — Z5181 Encounter for therapeutic drug level monitoring: Secondary | ICD-10-CM | POA: Diagnosis not present

## 2020-07-30 DIAGNOSIS — I4891 Unspecified atrial fibrillation: Secondary | ICD-10-CM

## 2020-07-30 DIAGNOSIS — Z7901 Long term (current) use of anticoagulants: Secondary | ICD-10-CM | POA: Diagnosis not present

## 2020-07-30 LAB — POCT INR: INR: 1.6 — AB (ref 2.0–3.0)

## 2020-07-30 NOTE — Patient Instructions (Signed)
Description   Today take 1 tablet then continue taking Warfarin 1/2 tablet daily except 1 tablet on Sundays and Thursdays. Keep leafy green intake consistent. Recheck in 1 week. Call with any questions [phone]

## 2020-08-07 ENCOUNTER — Ambulatory Visit (INDEPENDENT_AMBULATORY_CARE_PROVIDER_SITE_OTHER): Payer: Medicare Other | Admitting: Internal Medicine

## 2020-08-07 ENCOUNTER — Other Ambulatory Visit: Payer: Self-pay

## 2020-08-07 ENCOUNTER — Encounter: Payer: Self-pay | Admitting: Internal Medicine

## 2020-08-07 ENCOUNTER — Ambulatory Visit (INDEPENDENT_AMBULATORY_CARE_PROVIDER_SITE_OTHER): Payer: Medicare Other | Admitting: *Deleted

## 2020-08-07 VITALS — BP 144/86 | HR 62 | Ht 67.0 in | Wt 177.4 lb

## 2020-08-07 DIAGNOSIS — Z5181 Encounter for therapeutic drug level monitoring: Secondary | ICD-10-CM

## 2020-08-07 DIAGNOSIS — I4891 Unspecified atrial fibrillation: Secondary | ICD-10-CM

## 2020-08-07 DIAGNOSIS — Z7901 Long term (current) use of anticoagulants: Secondary | ICD-10-CM | POA: Diagnosis not present

## 2020-08-07 DIAGNOSIS — Z950 Presence of cardiac pacemaker: Secondary | ICD-10-CM

## 2020-08-07 DIAGNOSIS — I480 Paroxysmal atrial fibrillation: Secondary | ICD-10-CM

## 2020-08-07 DIAGNOSIS — I495 Sick sinus syndrome: Secondary | ICD-10-CM

## 2020-08-07 LAB — POCT INR: INR: 3 (ref 2.0–3.0)

## 2020-08-07 NOTE — Patient Instructions (Signed)
Description   Continue taking Warfarin 1/2 tablet daily except 1 tablet on Sundays and Thursdays. Keep leafy green intake consistent. Recheck in 2 weeks. Call with any questions [phone]

## 2020-08-07 NOTE — Progress Notes (Signed)
HPI [name] [name] returns today for followup of his PAF. He is a pleasant 80 yo man with a h/o PAF, s/p catheter ablation, and sinus node dysfunction s/p PPM insertion. He has lost weight. He has done well in the interim and his atrial fib burden has improved. He denies chest pain or sob.  Allergies  Allergen Reactions  . Iohexol Hives    POST MYELOGRAM.  PT ALSO SAID THIS HAPPENED A YEAR AGO WITH A MYELOGRAM AND WITH ESI'S., Onset Date: [id number]   . Meperidine Hcl Other (See Comments)    hallucinations  . Prednisone Other (See Comments)    Redness of skin & jittery     Current Outpatient Medications  Medication Sig Dispense Refill  . acetaminophen (TYLENOL) 500 MG tablet Take 1,500 mg by mouth daily.    Marland Kitchen ALPRAZolam (XANAX) 0.5 MG tablet Take 1 tablet (0.5 mg total) by mouth 3 (three) times daily as needed for anxiety. 90 tablet 0  . Fexofenadine-Pseudoephedrine (ALLEGRA-D 24 HOUR PO) Take 180 mg by mouth.    Marland Kitchen FLUoxetine (PROZAC) 40 MG capsule Take one tablet each day for depression. (40mg  ) 30 capsule 0  . fluticasone (FLOVENT DISKUS) 50 MCG/BLIST diskus inhaler Inhale 2 puffs into the lungs daily.    Marland Kitchen gabapentin (NEURONTIN) 100 MG capsule Take 100 mg by mouth as needed.    Marland Kitchen losartan (COZAAR) 25 MG tablet One tablet by mouth every other day 45 tablet 3  . meloxicam (MOBIC) 15 MG tablet Take 15 mg by mouth as needed.    . potassium chloride SA (K-DUR,KLOR-CON) 20 MEQ tablet Take one tablet two times a day for low potassium.    . propranolol (INDERAL) 20 MG tablet TAKE 1 TABLET BY MOUTH THREE TIMES DAILY 270 tablet 2  . warfarin (COUMADIN) 5 MG tablet TAKE AS DIRECTED 30 tablet 3   No current facility-administered medications for this visit.     Past Medical History:  Diagnosis Date  . Anemia    since post op- knee replacement   . Anxiety    takes xanax 2 times per day, taken mostly for ringing in ears    . Arthritis of knee, right    end-stage  . Bronchitis   .  Bursitis   . Complication of anesthesia    irritation in throat postop, makes a-fib worse   . Depression   . DJD (degenerative joint disease)    OA- "everywhere"  . Family history of anesthesia complication    sister with nausia and vomiting  . Gout   . Heart murmur   . HTN (hypertension)   . Hyperlipidemia   . Migraines   . Paroxysmal atrial fibrillation (HCC)   . PONV (postoperative nausea and vomiting)   . Tinnitus     ROS:   All systems reviewed and negative except as noted in the HPI.   Past Surgical History:  Procedure Laterality Date  . APPENDECTOMY    . Atrial fibrillation ablation  2009, 12/02/11   PVI by Dr Rayann Heman x 2  . ATRIAL FIBRILLATION ABLATION N/A 12/02/2011   Procedure: ATRIAL FIBRILLATION ABLATION;  Surgeon: Thompson Grayer, MD;  Location: [facility] CATH LAB;  Service: Cardiovascular;  Laterality: N/A;  . bilteral thumb joint surgrery    . CARDIAC CATHETERIZATION  2005    which revealed 40% stenosis of the mid left anterior descending artery., ablation- 2009  . CHOLECYSTECTOMY    . JOINT REPLACEMENT     L knee-  12/11,R hip- 1996  . left rotator cuff repair    . PACEMAKER INSERTION  06/06/2005   Medtronic EnRhythm dual-chamber. For sick sinus syndrome  . PERMANENT PACEMAKER GENERATOR CHANGE N/A 03/12/2012   Procedure: PERMANENT PACEMAKER GENERATOR CHANGE;  Surgeon: Evans Lance, MD;  Location: [facility] CATH LAB;  Service: Cardiovascular;  Laterality: N/A;  . RHINOPLASTY    . right rotator cuff surgery  2006  . right total hip arthroplasty    . TEE WITHOUT CARDIOVERSION  12/01/2011   Procedure: TRANSESOPHAGEAL ECHOCARDIOGRAM (TEE);  Surgeon: Fay Records, MD;  Location: [facility] ENDOSCOPY;  Service: Cardiovascular;  Laterality: N/A;  a-fib ablation following day  . TONSILLECTOMY     as a child  . TOTAL KNEE REVISION  08/08/2011   Procedure: TOTAL KNEE REVISION;  Surgeon: Kerin Salen, MD;  Location: [place];  Service: Orthopedics;  Laterality: Left;  DEPUY/SIGMA  .  TRANSESOPHAGEAL ECHOCARDIOGRAM  04/2008  . TRANSTHORACIC ECHOCARDIOGRAM  2006,2009  . uvulectomy       Family History  Problem Relation Age of Onset  . Coronary artery disease Mother   . Cerebrovascular Disease Mother   . Cancer Other   . Anesthesia problems Neg Hx   . Hypotension Neg Hx   . Malignant hyperthermia Neg Hx   . Pseudochol deficiency Neg Hx      Social History   Socioeconomic History  . Marital status: Married    Spouse name: Not on file  . Number of children: 2  . Years of education: Not on file  . Highest education level: Not on file  Occupational History  . Occupation: retired    Fish farm manager: RETIRED  Tobacco Use  . Smoking status: Never Smoker  . Smokeless tobacco: Never Used  Vaping Use  . Vaping Use: Never used  Substance and Sexual Activity  . Alcohol use: Yes    Comment: rare use  . Drug use: No  . Sexual activity: Not on file  Other Topics Concern  . Not on file  Social History Narrative  . Not on file   Social Determinants of Health   Financial Resource Strain: Not on file  Food Insecurity: Not on file  Transportation Needs: Not on file  Physical Activity: Not on file  Stress: Not on file  Social Connections: Not on file  Intimate Partner Violence: Not on file     BP (!) 144/86   Pulse 62   Ht 5\' 7"  (1.702 m)   Wt 177 lb 6.4 oz (80.5 kg)   SpO2 95%   BMI 27.78 kg/m   Physical Exam:  Well appearing NAD HEENT: Unremarkable Neck:  No JVD, no thyromegally Lymphatics:  No adenopathy Back:  No CVA tenderness Lungs:  Clear with no wheezes HEART:  Regular rate rhythm, no murmurs, no rubs, no clicks Abd:  soft, positive bowel sounds, no organomegally, no rebound, no guarding Ext:  2 plus pulses, no edema, no cyanosis, no clubbing Skin:  No rashes no nodules Neuro:  CN II through XII intact, motor grossly intact   EKG - nsr with atrial pacing  DEVICE  Normal device function.  See PaceArt for details.   Assess/Plan: 1. Sinus  node dysfunction - he is asymptomatic, s/p PPM insertion. 2. PAF - he is maintaining NSR about 97% of the time.  3. HTN - his bp is up a bit but is usually better at home. 4. PPM - his medtronic DDD PM is working [employer] and has about 5 years  of battery longevity.  Carleene Overlie Taylor,MD

## 2020-08-07 NOTE — Patient Instructions (Signed)
Medication Instructions:  Your physician recommends that you continue on your current medications as directed. Please refer to the Current Medication list given to you today.  Labwork: None ordered.  Testing/Procedures: None ordered.  Follow-Up: Your physician wants you to follow-up in: one year with Cristopher Peru, MD or one of the following Advanced Practice Providers on your designated Care Team:    Chanetta Marshall, NP  Tommye Standard, PA-C  Legrand Como "[name]" [place], [place]  Remote monitoring is used to monitor your Pacemaker from home. This monitoring reduces the number of office visits required to check your device to one time per year. It allows [place] to keep an eye on the functioning of your device to ensure it is working properly. You are scheduled for a device check from home on 08/16/2020. You may send your transmission at any time that day. If you have a wireless device, the transmission will be sent automatically. After your physician reviews your transmission, you will receive a postcard with your next transmission date.  Any Other Special Instructions Will Be Listed Below (If Applicable).  If you need a refill on your cardiac medications before your next appointment, please call your pharmacy.

## 2020-08-16 ENCOUNTER — Ambulatory Visit (INDEPENDENT_AMBULATORY_CARE_PROVIDER_SITE_OTHER): Payer: Medicare Other

## 2020-08-16 DIAGNOSIS — I495 Sick sinus syndrome: Secondary | ICD-10-CM | POA: Diagnosis not present

## 2020-08-17 LAB — CUP PACEART REMOTE DEVICE CHECK
Battery Impedance: 1132 Ohm
Battery Remaining Longevity: 58 mo
Battery Voltage: 2.78 V
Brady Statistic AP VP Percent: 0 %
Brady Statistic AP VS Percent: 98 %
Brady Statistic AS VP Percent: 0 %
Brady Statistic AS VS Percent: 2 %
Date Time Interrogation Session: 20220325085318
Implantable Lead Implant Date: 20070112
Implantable Lead Implant Date: 20070112
Implantable Lead Location: 753859
Implantable Lead Location: 753860
Implantable Lead Model: 5076
Implantable Lead Model: 5076
Implantable Pulse Generator Implant Date: 20131018
Lead Channel Impedance Value: 486 Ohm
Lead Channel Impedance Value: 590 Ohm
Lead Channel Pacing Threshold Amplitude: 0.5 V
Lead Channel Pacing Threshold Amplitude: 1 V
Lead Channel Pacing Threshold Pulse Width: 0.4 ms
Lead Channel Pacing Threshold Pulse Width: 0.4 ms
Lead Channel Setting Pacing Amplitude: 2 V
Lead Channel Setting Pacing Amplitude: 2.5 V
Lead Channel Setting Pacing Pulse Width: 0.34 ms
Lead Channel Setting Sensing Sensitivity: 2.8 mV

## 2020-08-20 ENCOUNTER — Ambulatory Visit (INDEPENDENT_AMBULATORY_CARE_PROVIDER_SITE_OTHER): Payer: Medicare Other

## 2020-08-20 ENCOUNTER — Other Ambulatory Visit: Payer: Self-pay

## 2020-08-20 DIAGNOSIS — I4891 Unspecified atrial fibrillation: Secondary | ICD-10-CM | POA: Diagnosis not present

## 2020-08-20 DIAGNOSIS — Z7901 Long term (current) use of anticoagulants: Secondary | ICD-10-CM

## 2020-08-20 DIAGNOSIS — Z5181 Encounter for therapeutic drug level monitoring: Secondary | ICD-10-CM

## 2020-08-20 LAB — POCT INR: INR: 3.2 — AB (ref 2.0–3.0)

## 2020-08-20 NOTE — Patient Instructions (Signed)
Description   Skip today's dosage of Warfarin, then resume same dosage 1/2 tablet daily except 1 tablet on Sundays and Thursdays. Keep leafy green intake consistent. Recheck in 3 weeks. Call with any questions [phone]

## 2020-08-28 NOTE — Progress Notes (Signed)
Remote pacemaker transmission.   

## 2020-09-16 ENCOUNTER — Other Ambulatory Visit: Payer: Self-pay | Admitting: Internal Medicine

## 2020-09-24 ENCOUNTER — Ambulatory Visit (INDEPENDENT_AMBULATORY_CARE_PROVIDER_SITE_OTHER): Payer: Medicare Other | Admitting: *Deleted

## 2020-09-24 ENCOUNTER — Other Ambulatory Visit: Payer: Self-pay

## 2020-09-24 DIAGNOSIS — I4891 Unspecified atrial fibrillation: Secondary | ICD-10-CM

## 2020-09-24 DIAGNOSIS — Z5181 Encounter for therapeutic drug level monitoring: Secondary | ICD-10-CM | POA: Diagnosis not present

## 2020-09-24 DIAGNOSIS — Z7901 Long term (current) use of anticoagulants: Secondary | ICD-10-CM | POA: Diagnosis not present

## 2020-09-24 DIAGNOSIS — I480 Paroxysmal atrial fibrillation: Secondary | ICD-10-CM

## 2020-09-24 LAB — POCT INR: INR: 2.4 (ref 2.0–3.0)

## 2020-09-24 NOTE — Patient Instructions (Signed)
Description   Continue taking Warfarin 1/2 tablet daily except 1 tablet on Sundays and Thursdays. Keep leafy green intake consistent. Recheck in 4 weeks. Call with any questions [phone]

## 2020-10-26 ENCOUNTER — Telehealth: Payer: Self-pay | Admitting: *Deleted

## 2020-10-26 NOTE — Telephone Encounter (Signed)
   [place] Pre-operative Risk Assessment    Patient Name: [name] [name]  DOB: [birth year]  MRN: [id number]   HEARTCARE STAFF: - Please ensure there is not already an duplicate clearance open for this procedure. - Under Visit Info/Reason for Call, type in Other and utilize the format Clearance MM/DD/YY or Clearance TBD. Do not use dashes or single digits. - If request is for dental extraction, please clarify the # of teeth to be extracted. - If the patient is currently at the dentist's office, call Pre-Op APP to address. If the patient is not currently in the dentist office, please route to the Pre-Op pool  Request for surgical clearance:  1. What type of surgery is being performed? RIGHT TOTAL KNEE REPLACEMENT   2. When is this surgery scheduled? TBD   3. What type of clearance is required (medical clearance vs. Pharmacy clearance to hold med vs. Both)? BOTH  4. Are there any medications that need to be held prior to surgery and how long? WARFARIN    5. Practice name and name of physician performing surgery? [name]; DR. [name]   6. What is the office phone number? [name]   7.   What is the office fax number? [place].   Anesthesia type (None, local, MAC, general) ? CHOICE   Julaine Hua 10/26/2020, 1:33 PM  _________________________________________________________________   (provider comments below)

## 2020-10-26 NOTE — Telephone Encounter (Signed)
Patient with diagnosis of afib on warfarin for anticoagulation.    Procedure: RIGHT TOTAL KNEE REPLACEMENT  Date of procedure: TBD  CHA2DS2-VASc Score = 3  This indicates a 3.2% annual risk of stroke. The patient's score is based upon: CHF History: No HTN History: Yes Diabetes History: No Stroke History: No Vascular Disease History: No Age Score: 2 Gender Score: 0     Per office protocol, patient can hold warfarin for 5 days prior to procedure.   Patient will NOT need bridging with Lovenox (enoxaparin) around procedure.

## 2020-10-26 NOTE — Telephone Encounter (Signed)
    JARREN PARA DOB:  12-12-1940  MRN:  [id number]   Primary Cardiologist: Cristopher Peru, MD  Chart reviewed as part of pre-operative protocol coverage.   Left VM requesting callback.  Pending absence of cardiac symptoms and exercise tolerance of >4 METS anticipate providing clearance.  Loel Dubonnet, NP 10/26/2020, 4:54 PM

## 2020-10-27 ENCOUNTER — Other Ambulatory Visit: Payer: Self-pay | Admitting: Internal Medicine

## 2020-10-29 ENCOUNTER — Other Ambulatory Visit: Payer: Self-pay

## 2020-10-29 ENCOUNTER — Ambulatory Visit (INDEPENDENT_AMBULATORY_CARE_PROVIDER_SITE_OTHER): Payer: Medicare Other | Admitting: *Deleted

## 2020-10-29 DIAGNOSIS — Z7901 Long term (current) use of anticoagulants: Secondary | ICD-10-CM | POA: Diagnosis not present

## 2020-10-29 DIAGNOSIS — I4891 Unspecified atrial fibrillation: Secondary | ICD-10-CM

## 2020-10-29 DIAGNOSIS — I480 Paroxysmal atrial fibrillation: Secondary | ICD-10-CM

## 2020-10-29 DIAGNOSIS — Z5181 Encounter for therapeutic drug level monitoring: Secondary | ICD-10-CM | POA: Diagnosis not present

## 2020-10-29 LAB — POCT INR: INR: 3.4 — AB (ref 2.0–3.0)

## 2020-10-29 MED ORDER — WARFARIN SODIUM 5 MG PO TABS
ORAL_TABLET | ORAL | 0 refills | Status: DC
Start: 2020-10-29 — End: 2020-12-04

## 2020-10-29 NOTE — Telephone Encounter (Signed)
    [name] [name] DOB:  [birth year]  MRN:  [id number]   Primary Cardiologist: Cristopher Peru, MD  Chart reviewed as part of pre-operative protocol coverage. Given past medical history and time since last visit, based on ACC/AHA guidelines, [name] [name] would be at acceptable risk for the planned procedure without further cardiovascular testing. [name] [name] can perform greater than 4 METS without anginal or SOB symptoms.   Per pharmacy:  Procedure: RIGHT TOTAL KNEE REPLACEMENT Date of procedure: TBD  CHA2DS2-VASc Score = 3  This indicates a 3.2% annual risk of stroke. The patient's score is based upon: CHF History: No HTN History: Yes Diabetes History: No Stroke History: No Vascular Disease History: No Age Score: 2 Gender Score: 0    Per office protocol, patient can hold warfarin for 5 days prior to procedure.  Patient will NOT need bridging with Lovenox (enoxaparin) around procedure.  The patient was advised that if he develops new symptoms prior to surgery to contact our office to arrange for a follow-up visit, and he verbalized understanding.  I will route this recommendation to the requesting party via Epic fax function and remove from pre-op pool.  Please call with questions.  Kathyrn Drown, NP 10/29/2020, 10:22 AM

## 2020-10-29 NOTE — Patient Instructions (Signed)
Description   Hold warfarin today, then continue taking Warfarin 1/2 tablet daily except 1 tablet on Sundays and Thursdays. Keep leafy green intake consistent. Recheck in 3 weeks. Call with any questions [phone]

## 2020-10-29 NOTE — Telephone Encounter (Signed)
Prescription refill request received for warfarin Lov: [name] [name], 08/07/2020 Next INR check: 6/2 Warfarin tablet strength: 5mg    Appointment scheduled for 6/10

## 2020-11-14 ENCOUNTER — Other Ambulatory Visit: Payer: Self-pay | Admitting: Orthopedic Surgery

## 2020-11-14 NOTE — Patient Instructions (Addendum)
DUE TO COVID-19 ONLY ONE VISITOR IS ALLOWED TO COME WITH YOU AND STAY IN THE WAITING ROOM ONLY DURING PRE OP AND PROCEDURE DAY OF SURGERY. THE 1 VISITOR  MAY VISIT WITH YOU AFTER SURGERY IN YOUR PRIVATE ROOM DURING VISITING HOURS ONLY!  YOU NEED TO HAVE A COVID 19 TEST ON: 11/23/20 @ 10:00 AM. THIS TEST MUST BE DONE BEFORE SURGERY,  COVID TESTING SITE [address] [place] [address], IT IS ON THE RIGHT GOING OUT [address] APPROXIMATELY  2 MINUTES PAST [employer] ON THE RIGHT. ONCE YOUR COVID TEST IS COMPLETED,  PLEASE BEGIN THE QUARANTINE INSTRUCTIONS AS OUTLINED IN YOUR HANDOUT.                [name] [name]   Your procedure is scheduled on: 11/27/20   Report to [facility] Main  Entrance   Report to admitting at : 10:00 AM     Call this number if you have problems the morning of surgery [phone]    Remember: Do not eat solid food :After Midnight. Clear liquids until: 9:30 am.  CLEAR LIQUID DIET  Foods Allowed                                                                     Foods Excluded  Coffee and tea, regular and decaf                             liquids that you cannot  Plain Jell-O any favor except red or purple                                           see through such as: Fruit ices (not with fruit pulp)                                     milk, soups, orange juice  Iced Popsicles                                    All solid food Carbonated beverages, regular and diet                                    Cranberry, grape and apple juices Sports drinks like Gatorade Lightly seasoned clear broth or consume(fat free) Sugar, honey syrup  Sample Menu Breakfast                                Lunch                                     Supper Cranberry juice  Beef broth                            Chicken broth Jell-O                                     Grape juice                           Apple juice Coffee or tea                         Jell-O                                      Popsicle                                                Coffee or tea                        Coffee or tea  _____________________________________________________________________   BRUSH YOUR TEETH MORNING OF SURGERY AND RINSE YOUR MOUTH OUT, NO CHEWING GUM CANDY OR MINTS.    Take these medicines the morning of surgery with A SIP OF WATER: gabapentin,fluoxetine,propanolol,flomax.Alprazolam as needed.                               You may not have any metal on your body including hair pins and              piercings  Do not wear jewelry, lotions, powders or perfumes, deodorant             Men may shave face and neck.   Do not bring valuables to the hospital. [name].  Contacts, dentures or bridgework may not be worn into surgery.  Leave suitcase in the car. After surgery it may be brought to your room.     Patients discharged the day of surgery will not be allowed to drive home. IF YOU ARE HAVING SURGERY AND GOING HOME THE SAME DAY, YOU MUST HAVE AN ADULT TO DRIVE YOU HOME AND BE WITH YOU FOR 24 HOURS. YOU MAY GO HOME BY TAXI OR UBER OR ORTHERWISE, BUT AN ADULT MUST ACCOMPANY YOU HOME AND STAY WITH YOU FOR 24 HOURS.  Name and phone number of your driver:  Special Instructions: N/A              Please read over the following fact sheets you were given: _____________________________________________________________________           [facility] - Preparing for Surgery Before surgery, you can play an important role.  Because skin is not sterile, your skin needs to be as free of germs as possible.  You can reduce the number of germs on your skin by washing with CHG (chlorahexidine gluconate) soap before surgery.  CHG is an antiseptic cleaner which kills germs and bonds with the skin to  continue killing germs even after washing. Please DO NOT use if you have an allergy to CHG or  antibacterial soaps.  If your skin becomes reddened/irritated stop using the CHG and inform your nurse when you arrive at Short Stay. Do not shave (including legs and underarms) for at least 48 hours prior to the first CHG shower.  You may shave your face/neck. Please follow these instructions carefully:  1.  Shower with CHG Soap the night before surgery and the  morning of Surgery.  2.  If you choose to wash your hair, wash your hair first as usual with your  normal  shampoo.  3.  After you shampoo, rinse your hair and body thoroughly to remove the  shampoo.                           4.  Use CHG as you would any other liquid soap.  You can apply chg directly  to the skin and wash                       Gently with a scrungie or clean washcloth.  5.  Apply the CHG Soap to your body ONLY FROM THE NECK DOWN.   Do not use on face/ open                           Wound or open sores. Avoid contact with eyes, ears mouth and genitals (private parts).                       Wash face,  Genitals (private parts) with your normal soap.             6.  Wash thoroughly, paying special attention to the area where your surgery  will be performed.  7.  Thoroughly rinse your body with warm water from the neck down.  8.  DO NOT shower/wash with your normal soap after using and rinsing off  the CHG Soap.                9.  Pat yourself dry with a clean towel.            10.  Wear clean pajamas.            11.  Place clean sheets on your bed the night of your first shower and do not  sleep with pets. Day of Surgery : Do not apply any lotions/deodorants the morning of surgery.  Please wear clean clothes to the hospital/surgery center.  FAILURE TO FOLLOW THESE INSTRUCTIONS MAY RESULT IN THE CANCELLATION OF YOUR SURGERY PATIENT SIGNATURE_________________________________  NURSE SIGNATURE__________________________________  ________________________________________________________________________

## 2020-11-14 NOTE — Progress Notes (Signed)
Pt. Needs orders for upcoming surgery.PAT and labs on 11/15/20.

## 2020-11-15 ENCOUNTER — Encounter (HOSPITAL_COMMUNITY): Payer: Self-pay

## 2020-11-15 ENCOUNTER — Encounter: Payer: Self-pay | Admitting: Internal Medicine

## 2020-11-15 ENCOUNTER — Encounter (HOSPITAL_COMMUNITY)
Admission: RE | Admit: 2020-11-15 | Discharge: 2020-11-15 | Disposition: A | Discharge status: Home / Self Care | Payer: Medicare Other | Source: Ambulatory Visit | Attending: Orthopedic Surgery | Admitting: Orthopedic Surgery

## 2020-11-15 ENCOUNTER — Other Ambulatory Visit: Payer: Self-pay

## 2020-11-15 DIAGNOSIS — Z01812 Encounter for preprocedural laboratory examination: Secondary | ICD-10-CM | POA: Diagnosis not present

## 2020-11-15 HISTORY — DX: Presence of cardiac pacemaker: Z95.0

## 2020-11-15 HISTORY — DX: Cardiac arrhythmia, unspecified: I49.9

## 2020-11-15 LAB — CBC
HCT: 41.2 % (ref 39.0–52.0)
Hemoglobin: 13.5 g/dL (ref 13.0–17.0)
MCH: 31.4 pg (ref 26.0–34.0)
MCHC: 32.8 g/dL (ref 30.0–36.0)
MCV: 95.8 fL (ref 80.0–100.0)
Platelets: 218 10*3/uL (ref 150–400)
RBC: 4.3 MIL/uL (ref 4.22–5.81)
RDW: 14.8 % (ref 11.5–15.5)
WBC: 5.8 10*3/uL (ref 4.0–10.5)
nRBC: 0 % (ref 0.0–0.2)

## 2020-11-15 LAB — BASIC METABOLIC PANEL
Anion gap: 3 — ABNORMAL LOW (ref 5–15)
BUN: 16 mg/dL (ref 8–23)
CO2: 30 mmol/L (ref 22–32)
Calcium: 8.9 mg/dL (ref 8.9–10.3)
Chloride: 103 mmol/L (ref 98–111)
Creatinine, Ser: 0.9 mg/dL (ref 0.61–1.24)
GFR, Estimated: >60 mL/min (ref >60)
Glucose, Bld: 102 mg/dL — ABNORMAL HIGH (ref 70–99)
Potassium: 4.7 mmol/L (ref 3.5–5.1)
Sodium: 136 mmol/L (ref 135–145)

## 2020-11-15 LAB — SURGICAL PCR SCREEN
MRSA, PCR: NEGATIVE
Staphylococcus aureus: NEGATIVE

## 2020-11-15 NOTE — Progress Notes (Signed)
[place] DEVICE PROGRAMMING  Patient Information: Name:  [name] [name]  DOB:  [birth year]  MRN:  [id number]    Joline Maxcy, RN  P Cv Div Heartcare Device Planned Procedure: Right total knee arthroplasty.  Surgeon:  Dr. Edmonia Lynch  Date of Procedure:  11/27/20  Cautery will be used.  Position during surgery:     Please send documentation back to:  [facility] (Fax # [phone])   Device Information:  Clinic EP Physician:  Cristopher Peru, MD   Device Type:  Pacemaker Manufacturer and Phone #:  Medtronic: [id number] Pacemaker Dependent?:  Yes.   Date of Last Device Check:  08/17/20 Normal Device Function?:  Yes.    Electrophysiologist's Recommendations:  Have magnet available. Provide continuous ECG monitoring when magnet is used or reprogramming is to be performed.  Procedure may interfere with device function.  Magnet should be placed over device during procedure.  Per Device Clinic Standing Orders, York Ram, RN  3:31 PM 11/15/2020

## 2020-11-15 NOTE — Progress Notes (Signed)
COVID Vaccine Completed: Yes Date COVID Vaccine completed: 07/2019 COVID vaccine manufacturer: Pfizer      PCP - Dr. Kellie Shropshire Cardiologist - Dr. Cristopher Peru. Clearance: Kathyrn Drown: 10/29/20: EPIC  Chest x-ray -  EKG - 08/07/20: EPIC Stress Test -  ECHO -  Cardiac Cath -  Pacemaker/ICD device last checked: 08/17/20  Sleep Study -  CPAP -   Fasting Blood Sugar -  Checks Blood Sugar _____ times a day  Blood Thinner Instructions: Coumadin will be held 5 days prior surgery as per cardiologist instructions. Aspirin Instructions: Last Dose:  Anesthesia review: Hx: HTN,Heart murmur,Afib,pacemaker.  Patient denies shortness of breath, fever, cough and chest pain at PAT appointment   Patient verbalized understanding of instructions that were given to them at the PAT appointment. Patient was also instructed that they will need to review over the PAT instructions again at home before surgery.

## 2020-11-19 ENCOUNTER — Other Ambulatory Visit: Payer: Self-pay

## 2020-11-19 ENCOUNTER — Ambulatory Visit (INDEPENDENT_AMBULATORY_CARE_PROVIDER_SITE_OTHER): Payer: Medicare Other | Admitting: Pharmacist

## 2020-11-19 DIAGNOSIS — Z5181 Encounter for therapeutic drug level monitoring: Secondary | ICD-10-CM

## 2020-11-19 DIAGNOSIS — I480 Paroxysmal atrial fibrillation: Secondary | ICD-10-CM

## 2020-11-19 DIAGNOSIS — I4891 Unspecified atrial fibrillation: Secondary | ICD-10-CM | POA: Diagnosis not present

## 2020-11-19 DIAGNOSIS — Z7901 Long term (current) use of anticoagulants: Secondary | ICD-10-CM

## 2020-11-19 LAB — POCT INR: INR: 1.9 — AB (ref 2.0–3.0)

## 2020-11-19 NOTE — Progress Notes (Signed)
Anesthesia Chart Review   Case: [id number] Date/Time: 11/27/20 1216   Procedure: TOTAL KNEE ARTHROPLASTY (Right: Knee)   Anesthesia type: Spinal   Pre-op diagnosis: OA RIGHT KNEE   Location: [facility] ROOM 08 / [facility]   Surgeons: Renette Butters, MD       DISCUSSION:80 y.o. never smoker with h/o PONV, HTN, PAF (on Coumadin), pacemaker in place due to sinus node dysfunction (device orders in 11/18/2020 progress note), right knee OA scheduled for above procedure 11/27/2020 with Dr. Edmonia Lynch.   Per cardiology note 10/29/2020, "Chart reviewed as part of pre-operative protocol coverage. Given past medical history and time since last visit, based on ACC/AHA guidelines, WISAM SIEFRING would be at acceptable risk for the planned procedure without further cardiovascular testing. Mr. Woolford can perform greater than 4 METS without anginal or SOB symptoms.   Per pharmacy:   Procedure: RIGHT TOTAL KNEE REPLACEMENT  Date of procedure: TBD   CHA2DS2-VASc Score = 3  This indicates a 3.2% annual risk of stroke. The patient's score is based upon: CHF History: No HTN History: Yes Diabetes History: No Stroke History: No Vascular Disease History: No Age Score: 2 Gender Score: 0     Per office protocol, patient can hold warfarin for 5 days prior to procedure.  Patient will NOT need bridging with Lovenox (enoxaparin) around procedure."  Anticipate pt can proceed with planned procedure barring acute status change.   VS: BP 136/74   Pulse 78   Temp 36.4 C (Oral)   Ht 5\' 6"  (1.676 m)   Wt 75.3 kg   SpO2 100%   BMI 26.79 kg/m   PROVIDERS: Sherald Hess., MD  Cristopher Peru, MD is EP LABS: Labs reviewed: Acceptable for surgery. (all labs ordered are listed, but only abnormal results are displayed)  Labs Reviewed  BASIC METABOLIC PANEL - Abnormal; Notable for the following components:      Result Value   Glucose, Bld 102 (*)    Anion gap 3 (*)    All other components within normal  limits  SURGICAL PCR SCREEN  CBC     IMAGES:   EKG: 08/07/2020 Rate 62 bpm  NSR  CV:  Past Medical History:  Diagnosis Date   Anemia    since post op- knee replacement    Anxiety    takes xanax 2 times per day, taken mostly for ringing in ears     Arthritis of knee, right    end-stage   Bronchitis    Bursitis    Complication of anesthesia    irritation in throat postop, makes a-fib worse    Depression    DJD (degenerative joint disease)    OA- "everywhere"   Dysrhythmia    Family history of anesthesia complication    sister with nausia and vomiting   Gout    Heart murmur    HTN (hypertension)    Hyperlipidemia    Migraines    Paroxysmal atrial fibrillation (HCC)    PONV (postoperative nausea and vomiting)    Presence of permanent cardiac pacemaker    Tinnitus     Past Surgical History:  Procedure Laterality Date   APPENDECTOMY     Atrial fibrillation ablation  2009, 12/02/11   PVI by Dr Rayann Heman x 2   ATRIAL FIBRILLATION ABLATION N/A 12/02/2011   Procedure: ATRIAL FIBRILLATION ABLATION;  Surgeon: Thompson Grayer, MD;  Location: [facility] CATH LAB;  Service: Cardiovascular;  Laterality: N/A;   bilteral thumb joint surgrery  CARDIAC CATHETERIZATION  2005    which revealed 40% stenosis of the mid left anterior descending artery., ablation- 2009   CHOLECYSTECTOMY     JOINT REPLACEMENT     L knee- 12/11,R hip- 1996   left rotator cuff repair     PACEMAKER INSERTION  06/06/2005   Medtronic EnRhythm dual-chamber. For sick sinus syndrome   PERMANENT PACEMAKER GENERATOR CHANGE N/A 03/12/2012   Procedure: PERMANENT PACEMAKER GENERATOR CHANGE;  Surgeon: Evans Lance, MD;  Location: [facility] CATH LAB;  Service: Cardiovascular;  Laterality: N/A;   RHINOPLASTY     right rotator cuff surgery  2006   right total hip arthroplasty     TEE WITHOUT CARDIOVERSION  12/01/2011   Procedure: TRANSESOPHAGEAL ECHOCARDIOGRAM (TEE);  Surgeon: Fay Records, MD;  Location: [place];  Service:  Cardiovascular;  Laterality: N/A;  a-fib ablation following day   TONSILLECTOMY     as a child   TOTAL KNEE REVISION  08/08/2011   Procedure: TOTAL KNEE REVISION;  Surgeon: Kerin Salen, MD;  Location: [place];  Service: Orthopedics;  Laterality: Left;  DEPUY/SIGMA   TRANSESOPHAGEAL ECHOCARDIOGRAM  04/2008   TRANSTHORACIC ECHOCARDIOGRAM  2006,2009   uvulectomy      MEDICATIONS:  acetaminophen (TYLENOL) 500 MG tablet   ALPRAZolam (XANAX) 0.5 MG tablet   Carboxymethylcellulose Sodium (THERATEARS OP)   Cholecalciferol (DIALYVITE VITAMIN D 5000) 125 MCG (5000 UT) capsule   diclofenac Sodium (VOLTAREN) 1 % GEL   ferrous gluconate (IRON 27) 240 (27 FE) MG tablet   FLUoxetine (PROZAC) 40 MG capsule   gabapentin (NEURONTIN) 100 MG capsule   losartan (COZAAR) 25 MG tablet   Potassium 99 MG TABS   potassium chloride SA (K-DUR,KLOR-CON) 20 MEQ tablet   propranolol (INDERAL) 20 MG tablet   tamsulosin (FLOMAX) 0.4 MG CAPS capsule   vitamin B-12 (CYANOCOBALAMIN) 1000 MCG tablet   warfarin (COUMADIN) 5 MG tablet   No current facility-administered medications for this encounter.     Konrad Felix, PA-C [facility] [phone]

## 2020-11-19 NOTE — Patient Instructions (Signed)
Take 1 tablet today then continue taking Warfarin 1/2 tablet daily except 1 tablet on Sundays and Thursdays. Hold warfarin starting 6/30. Resume warfarin when ok by MD. Take an extra 1/2 tablet for 2 days when you resume. Keep leafy green intake consistent 2x per week. Recheck in 2 weeks. Call with any questions [phone]

## 2020-11-22 NOTE — H&P (Signed)
KNEE ARTHROPLASTY ADMISSION H&P  Patient ID: [name] [name] MRN: [id number] DOB/AGE: [birth year] 80 y.o.  Chief Complaint: right knee pain.  Planned Procedure Date: 11/27/20 Medical Clearance by Dr. Royce Macadamia   Cardiac Clearance by Dr. Lovena Le   HPI: [name] [name] is a 80 y.o. male who presents for evaluation of OA RIGHT KNEE. The patient has a history of pain and functional disability in the right knee due to arthritis and has failed non-surgical conservative treatments for greater than 12 weeks to include NSAID's and/or analgesics, corticosteriod injections, viscosupplementation injections, supervised PT with diminished ADL's post treatment, and activity modification.  Onset of symptoms was gradual, starting 6 years ago with gradually worsening course since that time. The patient noted no past surgery on the right knee.  Patient currently rates pain at 8 out of 10 with activity. Patient has night pain, worsening of pain with activity and weight bearing, and pain that interferes with activities of daily living.  Patient has evidence of subchondral sclerosis and joint space narrowing by imaging studies.  There is no active infection.  Past Medical History:  Diagnosis Date   Anemia    since post op- knee replacement    Anxiety    takes xanax 2 times per day, taken mostly for ringing in ears     Arthritis of knee, right    end-stage   Bronchitis    Bursitis    Complication of anesthesia    irritation in throat postop, makes a-fib worse    Depression    DJD (degenerative joint disease)    OA- "everywhere"   Dysrhythmia    Family history of anesthesia complication    sister with nausia and vomiting   Gout    Heart murmur    HTN (hypertension)    Hyperlipidemia    Migraines    Paroxysmal atrial fibrillation (HCC)    PONV (postoperative nausea and vomiting)    Presence of permanent cardiac pacemaker    Tinnitus    Past Surgical History:  Procedure Laterality Date   APPENDECTOMY      Atrial fibrillation ablation  2009, 12/02/11   PVI by Dr Rayann Heman x 2   ATRIAL FIBRILLATION ABLATION N/A 12/02/2011   Procedure: ATRIAL FIBRILLATION ABLATION;  Surgeon: Thompson Grayer, MD;  Location: [facility] CATH LAB;  Service: Cardiovascular;  Laterality: N/A;   bilteral thumb joint surgrery     CARDIAC CATHETERIZATION  2005    which revealed 40% stenosis of the mid left anterior descending artery., ablation- 2009   CHOLECYSTECTOMY     JOINT REPLACEMENT     L knee- 12/11,R hip- 1996   left rotator cuff repair     PACEMAKER INSERTION  06/06/2005   Medtronic EnRhythm dual-chamber. For sick sinus syndrome   PERMANENT PACEMAKER GENERATOR CHANGE N/A 03/12/2012   Procedure: PERMANENT PACEMAKER GENERATOR CHANGE;  Surgeon: Evans Lance, MD;  Location: [facility] CATH LAB;  Service: Cardiovascular;  Laterality: N/A;   RHINOPLASTY     right rotator cuff surgery  2006   right total hip arthroplasty     TEE WITHOUT CARDIOVERSION  12/01/2011   Procedure: TRANSESOPHAGEAL ECHOCARDIOGRAM (TEE);  Surgeon: Fay Records, MD;  Location: [place];  Service: Cardiovascular;  Laterality: N/A;  a-fib ablation following day   TONSILLECTOMY     as a child   TOTAL KNEE REVISION  08/08/2011   Procedure: TOTAL KNEE REVISION;  Surgeon: Kerin Salen, MD;  Location: [place];  Service: Orthopedics;  Laterality: Left;  DEPUY/SIGMA  TRANSESOPHAGEAL ECHOCARDIOGRAM  04/2008   TRANSTHORACIC ECHOCARDIOGRAM  9735,3299   uvulectomy     Allergies  Allergen Reactions   Iohexol Hives    POST MYELOGRAM.  PT ALSO SAID THIS HAPPENED A YEAR AGO WITH A MYELOGRAM AND WITH ESI'S., Onset Date: [id number]    Meperidine Hcl Other (See Comments)    hallucinations   Demerol Hcl [Meperidine] Other (See Comments)    Hallucinations   Prednisone Other (See Comments)    Redness of skin & jittery   Prior to Admission medications   Medication Sig Start Date End Date Taking? Authorizing Provider  acetaminophen (TYLENOL) 500 MG tablet Take 500 mg by mouth  3 (three) times daily.   Yes [provider]  ALPRAZolam (XANAX) 0.5 MG tablet Take 1 tablet (0.5 mg total) by mouth 3 (three) times daily as needed for anxiety. 03/28/13  Yes Mashburn, Marlane Hatcher, PA-C  Carboxymethylcellulose Sodium (THERATEARS OP) Place 1 drop into both eyes daily as needed (burning).   Yes [provider]  Cholecalciferol (DIALYVITE VITAMIN D 5000) 125 MCG (5000 UT) capsule Take 5,000 Units by mouth 3 (three) times a week.   Yes [provider]  diclofenac Sodium (VOLTAREN) 1 % GEL Apply 1 application topically daily.   Yes [provider]  ferrous gluconate (IRON 27) 240 (27 FE) MG tablet Take 240 mg by mouth daily.   Yes [provider]  FLUoxetine (PROZAC) 40 MG capsule Take one tablet each day for depression. (40mg  ) Patient taking differently: Take 40 mg by mouth daily. 03/28/13  Yes Mashburn, Milta Deiters T, PA-C  gabapentin (NEURONTIN) 100 MG capsule Take 200 mg by mouth 3 (three) times daily.   Yes [provider]  losartan (COZAAR) 25 MG tablet One tablet by mouth every other day Patient taking differently: Take 25 mg by mouth every other day. 06/18/20  Yes Evans Lance, MD  Potassium 99 MG TABS Take 99 mg by mouth daily.   Yes [provider]  propranolol (INDERAL) 20 MG tablet TAKE 1 TABLET BY MOUTH THREE TIMES DAILY Patient taking differently: Take 20 mg by mouth 3 (three) times daily. 09/17/20  Yes Evans Lance, MD  tamsulosin (FLOMAX) 0.4 MG CAPS capsule Take 0.4 mg by mouth daily. 08/10/20  Yes [provider]  vitamin B-12 (CYANOCOBALAMIN) 1000 MCG tablet Take 1,000 mcg by mouth daily.   Yes [provider]  warfarin (COUMADIN) 5 MG tablet Take as directed by the anticoagulation clinic Patient taking differently: Take 2.5-5 mg by mouth See admin instructions. Take 2.5 mg on Mon, Tues, Wed, Fri, and Sat. 5 mg on Thurs and Sun 10/29/20  Yes Evans Lance, MD  potassium chloride SA  (K-DUR,KLOR-CON) 20 MEQ tablet Take one tablet two times a day for low potassium. Patient not taking: Reported on 11/08/2020 03/28/13   Ruben Im, PA-C   Social History   Socioeconomic History   Marital status: Married    Spouse name: Not on file   Number of children: 2   Years of education: Not on file   Highest education level: Not on file  Occupational History   Occupation: retired    Fish farm manager: RETIRED  Tobacco Use   Smoking status: Never   Smokeless tobacco: Never  Vaping Use   Vaping Use: Never used  Substance and Sexual Activity   Alcohol use: Not Currently    Comment: rare use   Drug use: No   Sexual activity: Not on file  Other  Topics Concern   Not on file  Social History Narrative   Not on file   Social Determinants of Health   Financial Resource Strain: Not on file  Food Insecurity: Not on file  Transportation Needs: Not on file  Physical Activity: Not on file  Stress: Not on file  Social Connections: Not on file   Family History  Problem Relation Age of Onset   Coronary artery disease Mother    Cerebrovascular Disease Mother    Cancer Other    Anesthesia problems Neg Hx    Hypotension Neg Hx    Malignant hyperthermia Neg Hx    Pseudochol deficiency Neg Hx     ROS: Currently denies lightheadedness, dizziness, Fever, chills, CP, SOB.   No personal history of DVT, PE, MI, or CVA. Partial dentures. All other systems have been reviewed and were otherwise currently negative with the exception of those mentioned in the HPI and as above.  Objective: Vitals: Ht: 5'6" Wt: 166 lbs Temp: 96.9 BP: 100/66 Pulse: 91 O2 99% on room air.   Physical Exam: General: Alert, NAD.  Antalgic Gait  HEENT: EOMI, Good Neck Extension  Pulm: No increased work of breathing.  Clear B/L A/P w/o crackle or wheeze.  CV: Irregularly irregular rhythm (A Fib), No m/g/r appreciated  GI: soft, NT, ND. BS x 4 quadrants.  Neuro: CN II-XII grossly intact without focal deficit.   Sensation intact distally Skin: No lesions in the area of chief complaint. Evidence of easy bruising all over body. MSK/Surgical Site: pain with right knee ROM. + medial JLT. ROM 0-110 degrees.  5/5 strength in extension and flexion.  +EHL/FHL.  NVI.  Instability with varus and valgus stress.    Imaging Review Plain radiographs demonstrate severe degenerative joint disease of the right knee.   The overall alignment isneutral. The bone quality appears to be fair for age and reported activity level.  Preoperative templating of the joint replacement has been completed, documented, and submitted to the Operating Room personnel in order to optimize intra-operative equipment management.  Assessment: OA RIGHT KNEE Active Problems:   * No active hospital problems. *   Plan: Plan for Procedure(s): TOTAL KNEE ARTHROPLASTY  The patient history, physical exam, clinical judgement of the provider and imaging are consistent with end stage degenerative joint disease and total joint arthroplasty is deemed medically necessary. The treatment options including medical management, injection therapy, and arthroplasty were discussed at length. The risks and benefits of Procedure(s): TOTAL KNEE ARTHROPLASTY were presented and reviewed.  The risks of nonoperative treatment, versus surgical intervention including but not limited to continued pain, aseptic loosening, stiffness, dislocation/subluxation, infection, bleeding, nerve injury, blood clots, cardiopulmonary complications, morbidity, mortality, among others were discussed. The patient verbalizes understanding and wishes to proceed with the plan.  Patient is being admitted for inpatient treatment for surgery, pain control, PT, prophylactic antibiotics, VTE prophylaxis, progressive ambulation, ADL's and discharge planning. He will spend the night in observation.  Dental prophylaxis discussed and recommended for 2 years postoperatively.  The patient does meet  the criteria for TXA which will be used perioperatively.   Warfarin will be used postoperatively for DVT prophylaxis in addition to SCDs, and early ambulation. Plan for Tylenol, Gabapentin, oxycodone for pain.   Robaxin for muscle spasms.   Zofran for nausea and vomiting. Omeprazole for gastric protection. Pharmacy- [facility] on [address] The patient is planning to be discharged home with HHPT and into the care of his wife Pamala Hurry who can be  reached at [phone]. Follow up appt 12/12/20 4:15pm    Alisa Graff Office [phone] 11/22/2020 5:42 PM

## 2020-11-23 ENCOUNTER — Other Ambulatory Visit (HOSPITAL_COMMUNITY)
Admission: RE | Admit: 2020-11-23 | Discharge: 2020-11-23 | Disposition: A | Discharge status: Home / Self Care | Payer: Medicare Other | Source: Ambulatory Visit | Attending: Orthopedic Surgery | Admitting: Orthopedic Surgery

## 2020-11-23 ENCOUNTER — Telehealth: Payer: Self-pay

## 2020-11-23 DIAGNOSIS — Z01812 Encounter for preprocedural laboratory examination: Secondary | ICD-10-CM | POA: Insufficient documentation

## 2020-11-23 DIAGNOSIS — Z20822 Contact with and (suspected) exposure to covid-19: Secondary | ICD-10-CM | POA: Insufficient documentation

## 2020-11-23 LAB — SARS CORONAVIRUS 2 (TAT 6-24 HRS): SARS Coronavirus 2: NEGATIVE

## 2020-11-23 IMAGING — CR DG CHEST 2V
2 series · 2 of 2 positions shown · non-contrast
Comparison: Chest x-ray of 10/07/2011

CLINICAL DATA: Syncope, atrial fibrillation, dizziness and
shortness of breath

EXAM:
CHEST - 2 VIEW

[w chest pa]
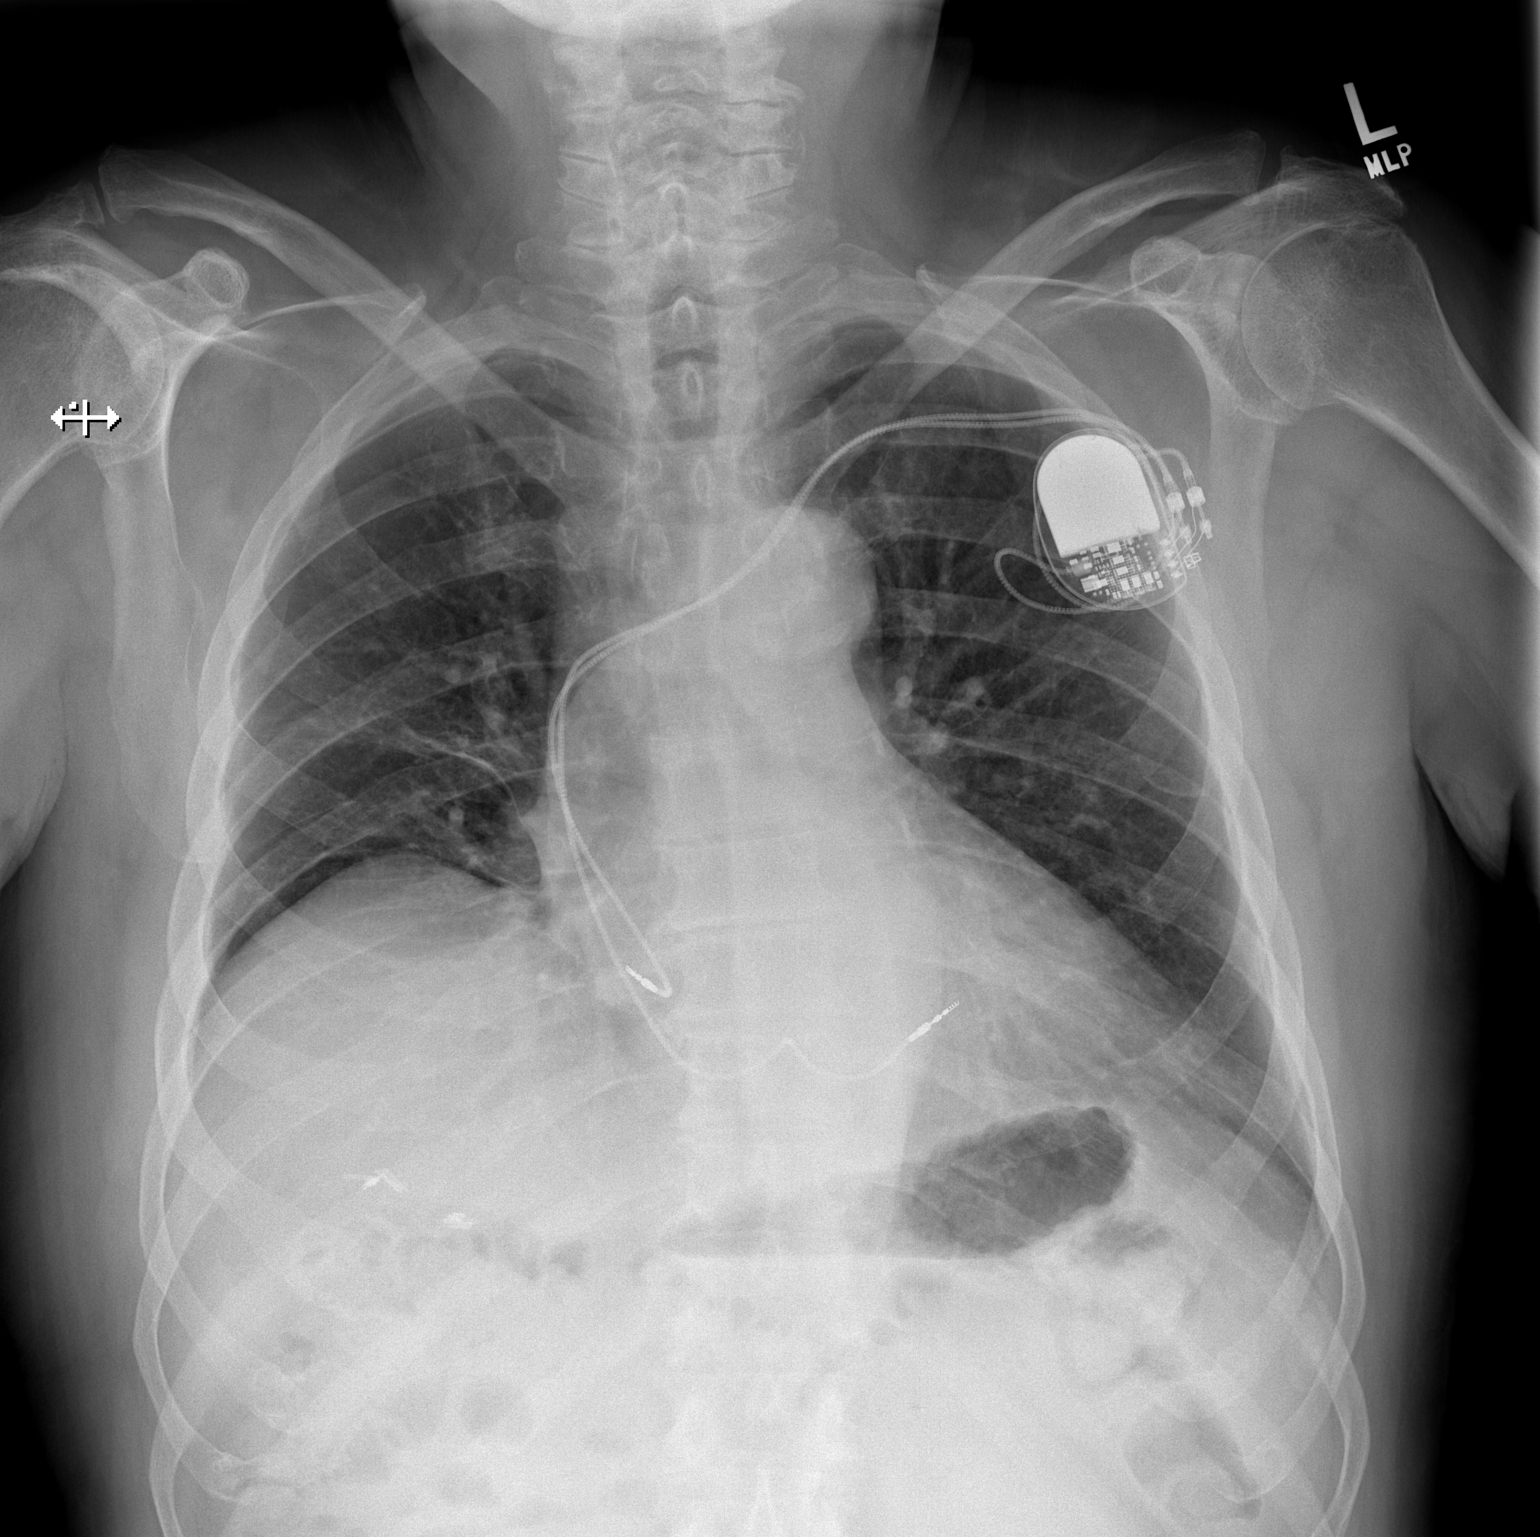

[w chest lat]
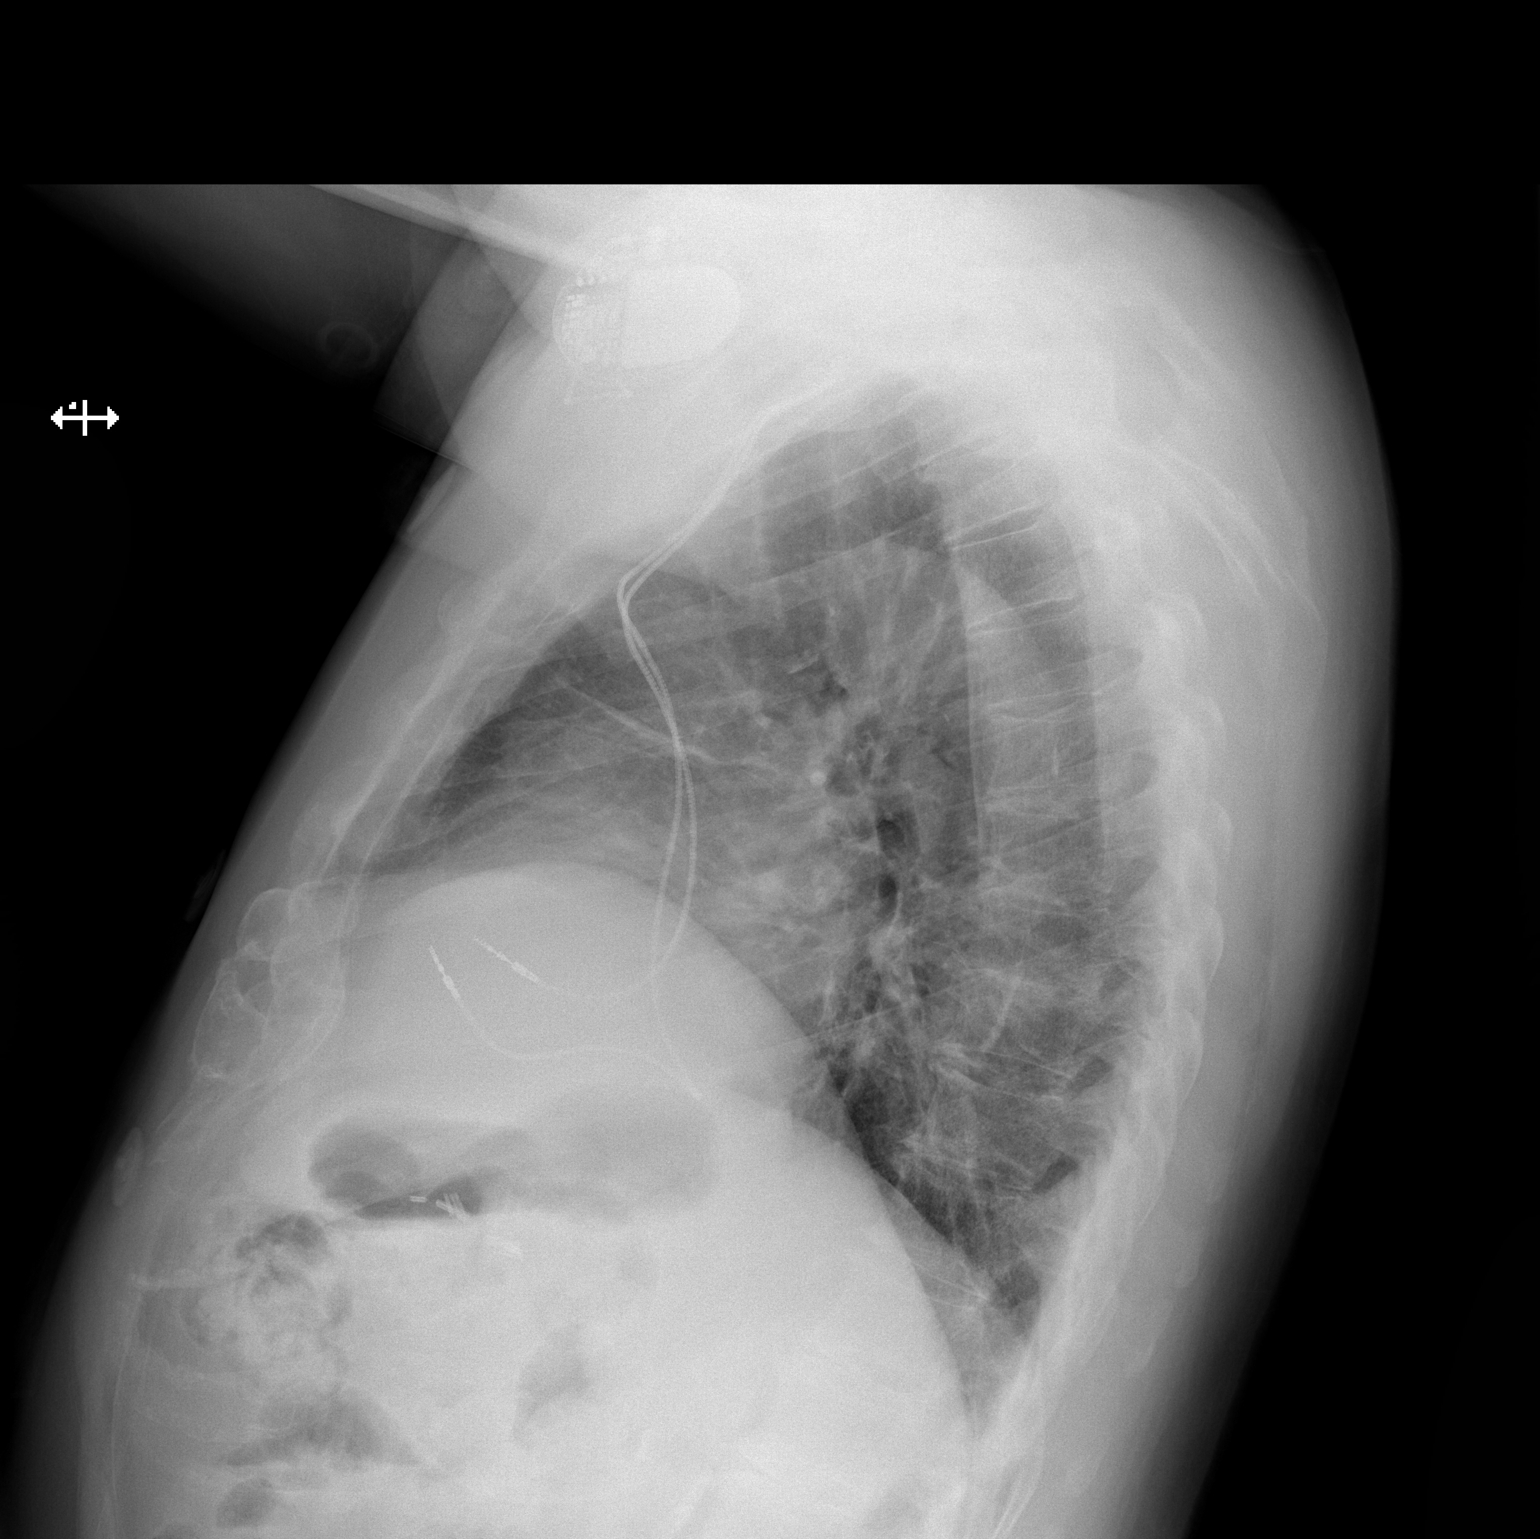

[2 of 2 positions shown; findings below may reference images not displayed]

FINDINGS: Mild linear scarring remains in the right mid lung probably
involving the anterior inferior aspect of the right upper lobe.
Slight elevation of the right hemidiaphragm is stable. No pneumonia
or pleural effusion is seen. Permanent pacemaker remains.
Cardiomegaly is stable. No acute bony abnormality is seen.
IMPRESSION: 1. No pneumonia or effusion. Chronic elevation of the right
hemidiaphragm.
2. Permanent pacemaker remains.

## 2020-11-23 IMAGING — CT CT HEAD W/O CM
3 series · 15 of 47 positions shown, 18 images · non-contrast
Comparison: Head CT 03/27/2013 and earlier.

CLINICAL DATA: 76-year-old male status post syncope upon waking
this morning with fall. Headache. Atrial fibrillation on warfarin.

EXAM:
CT HEAD WITHOUT CONTRAST
TECHNIQUE: Contiguous axial images were obtained from the base of the skull
through the vertex without intravenous contrast.

[Series 2: head wo · axial · 0.43mm/px · z∈[+1510,+1635]mm · 9 of 31 slices shown, 12 images]
[im 3/31  brain]
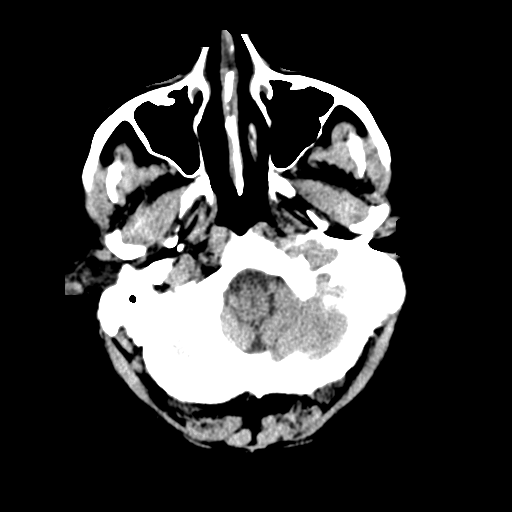
[im 3/31  bone]
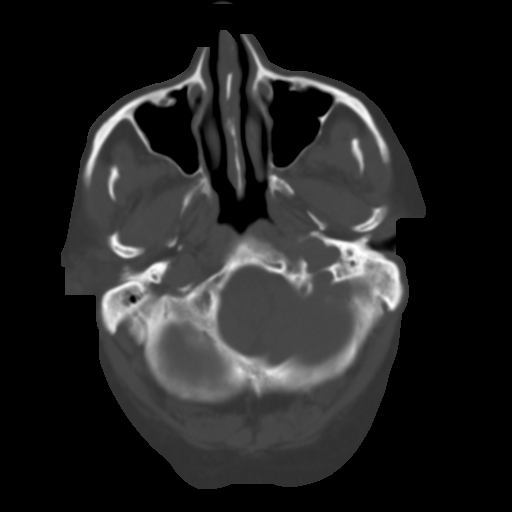
[im 6/31  brain]
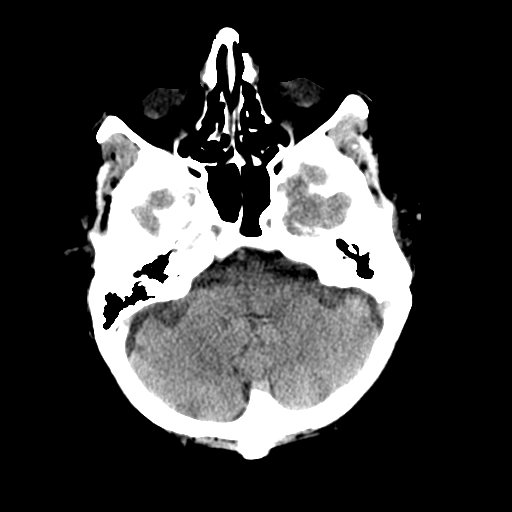
[im 9/31  brain]
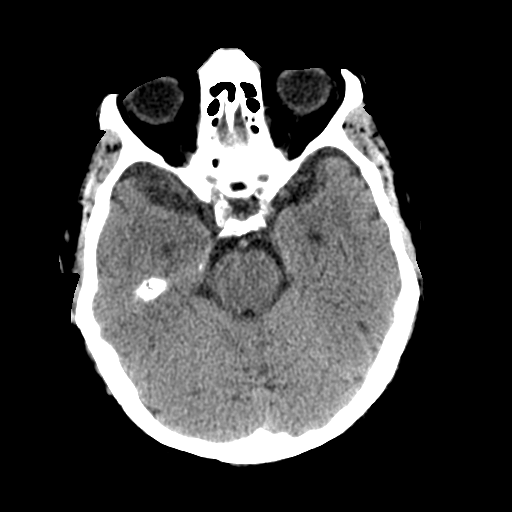
[im 12/31  brain]
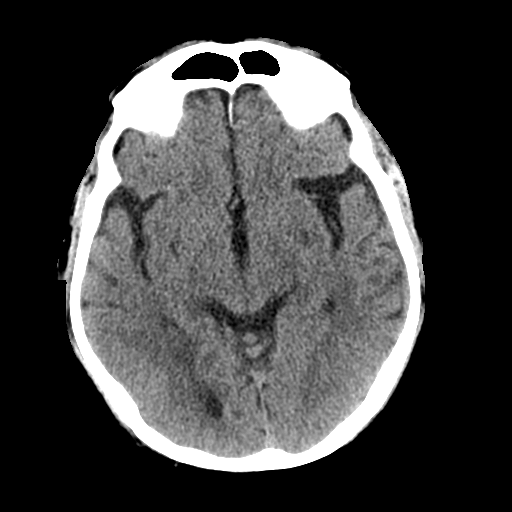
[im 16/31  brain]
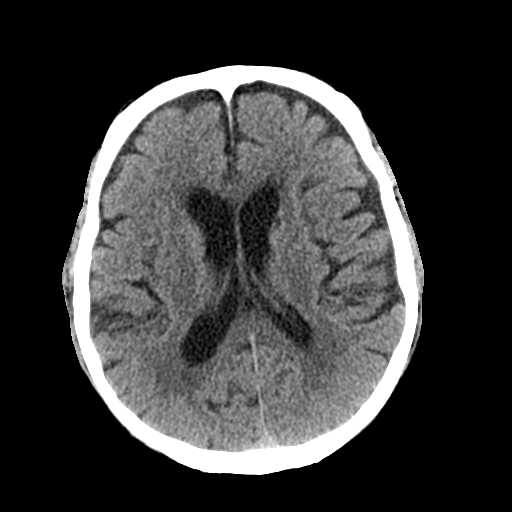
[im 16/31  bone]
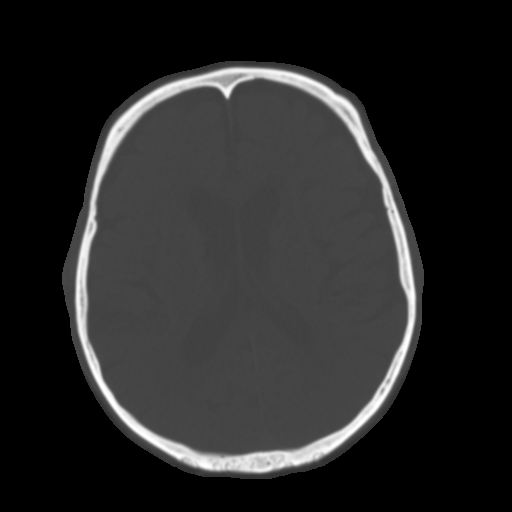
[im 19/31  brain]
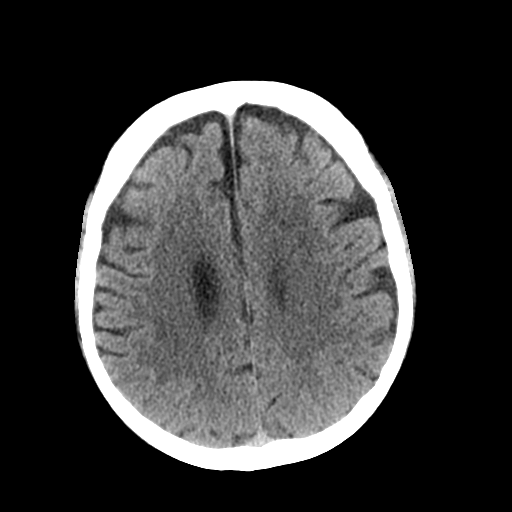
[im 22/31  brain]
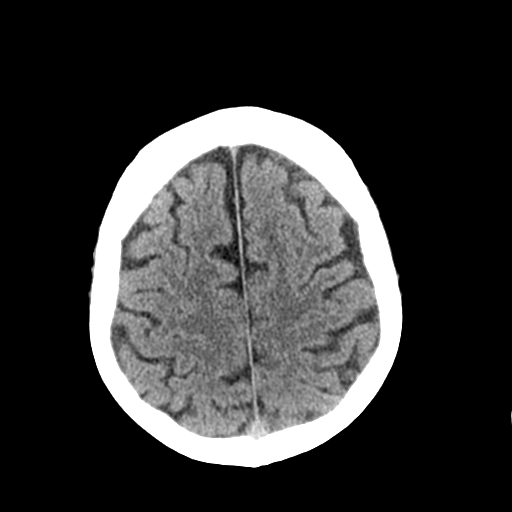
[im 25/31  brain]
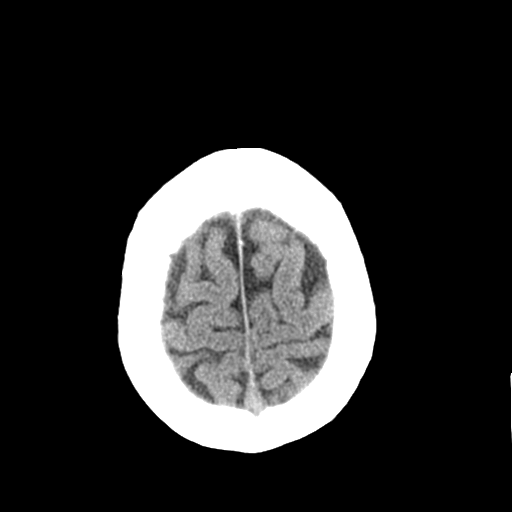
[im 28/31  brain]
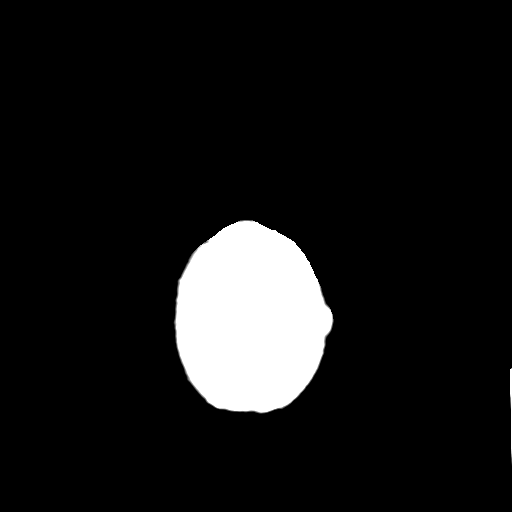
[im 28/31  bone]
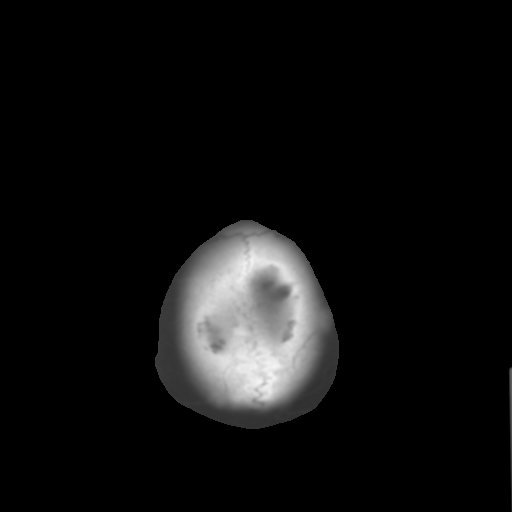

[Series 5: coronal soft tissue · coronal · 0.30mm/px · 3 of 62 slices shown]
[im 21/62  brain]
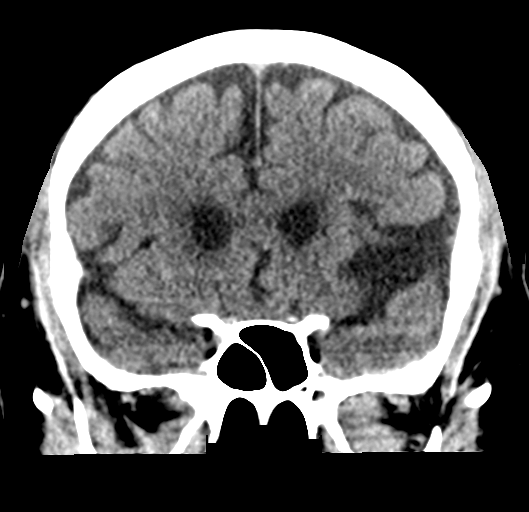
[im 28/62  brain]
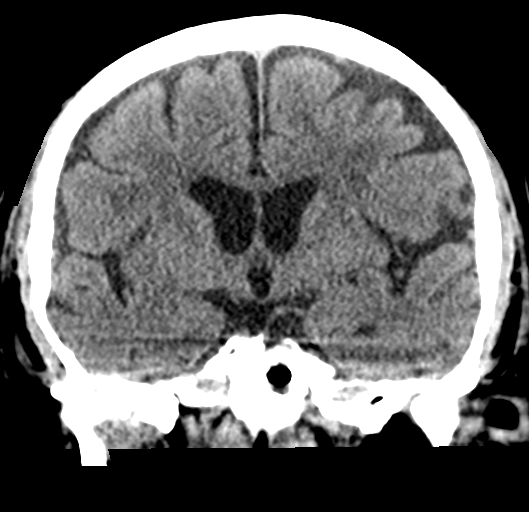
[im 34/62  brain]
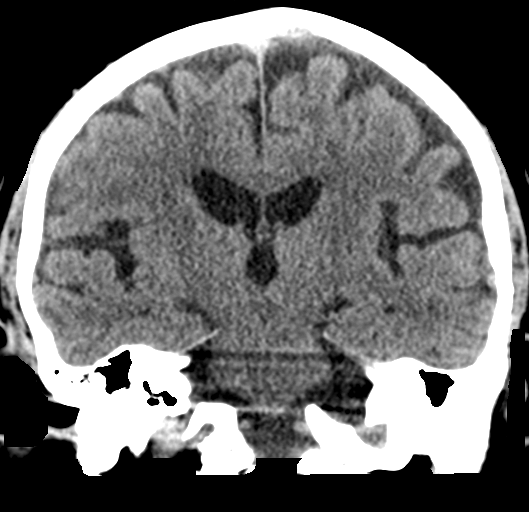

[Series 6: sagittal soft tissue · sagittal · 0.30mm/px · 3 of 57 slices shown]
[im 19/57  brain]
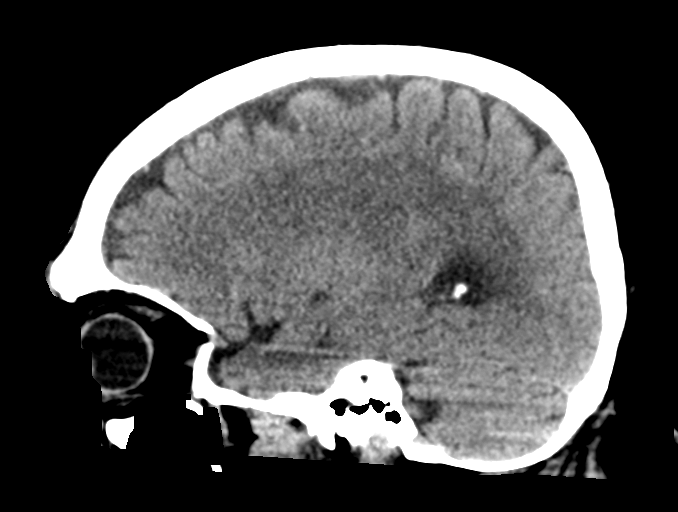
[im 29/57  brain]
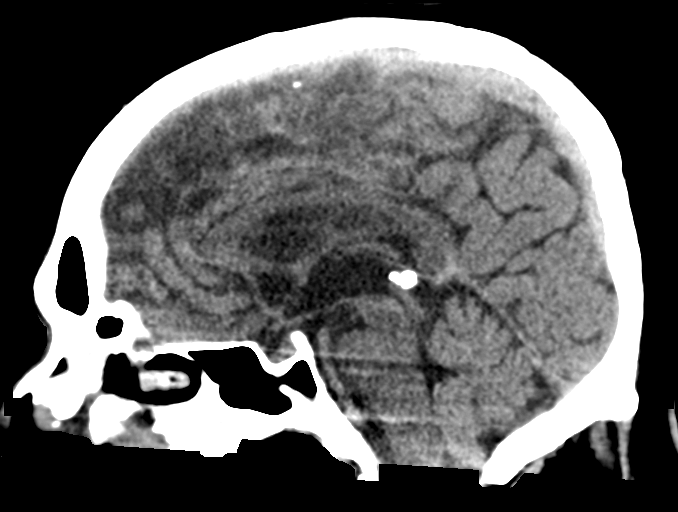
[im 38/57  brain]
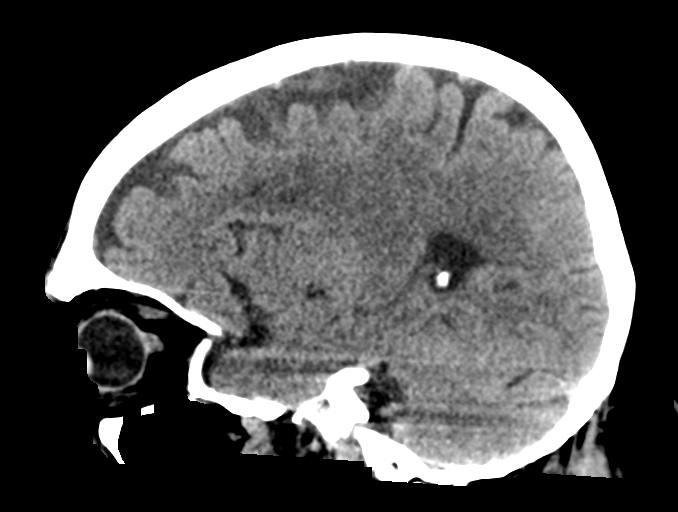

[15 of 47 positions shown; findings below may reference images not displayed]

FINDINGS: Brain: Well-defined hypodensity in the left basal ganglia is new
since 0820 on series 2, image 14. This has a chronic appearance on
coronal image 27. Elsewhere gray-white matter differentiation is
stable and normal for age.

No midline shift, ventriculomegaly, mass effect, evidence of mass
lesion, intracranial hemorrhage or evidence of cortically based
acute infarction. Gray-white matter differentiation is within normal
limits throughout the brain.

Vascular: Calcified atherosclerosis at the skull base. No suspicious
intracranial vascular hyperdensity.

Skull: Stable, negative.

Sinuses/Orbits: Mild new frontal and ethmoid sinus mucosal
thickening plus trace bubbly opacity in the left sphenoid. Chronic
mild maxillary mucosal thickening. Tympanic cavities and mastoids
remain clear.

Other: Visualized scalp soft tissues are within normal limits.
Visualized orbit soft tissues are within normal limits.
IMPRESSION: 1. Age indeterminate but probably chronic lacunar infarct in the
left basal ganglia, new since [DATE]. Otherwise stable and normal for age noncontrast CT appearance of
the brain.
3. Mild paranasal sinus inflammation.

## 2020-11-23 NOTE — Telephone Encounter (Signed)
The patient called to get help reactivating his handheld with the app. I called [employer] tech support to get additional help. Transmission received 11-23-2020.

## 2020-11-23 NOTE — Progress Notes (Signed)
Pt aware to arrive at [facility] admitting at 0730 on 11/27/2020. No food after midnight; clear liquids from midnight till 0700 day of surgery then nothing by mouth.

## 2020-11-26 MED ORDER — BUPIVACAINE LIPOSOME 1.3 % IJ SUSP
20.0000 mL | Freq: Once | INTRAMUSCULAR | Status: DC
  Filled 2020-11-26: qty 20

## 2020-11-27 ENCOUNTER — Ambulatory Visit (HOSPITAL_COMMUNITY): Payer: Medicare Other | Admitting: Physician Assistant

## 2020-11-27 ENCOUNTER — Observation Stay (HOSPITAL_COMMUNITY): Payer: Medicare Other

## 2020-11-27 ENCOUNTER — Observation Stay (HOSPITAL_COMMUNITY)
Admission: RE | Admit: 2020-11-27 | Discharge: 2020-11-28 | Disposition: A | Discharge status: Home / Self Care | Payer: Medicare Other | Source: Ambulatory Visit | Attending: Orthopedic Surgery | Admitting: Orthopedic Surgery

## 2020-11-27 ENCOUNTER — Encounter (HOSPITAL_COMMUNITY)
Admission: RE | Disposition: A | Discharge status: Home / Self Care | Payer: Self-pay | Source: Ambulatory Visit | Attending: Orthopedic Surgery

## 2020-11-27 ENCOUNTER — Ambulatory Visit (INDEPENDENT_AMBULATORY_CARE_PROVIDER_SITE_OTHER): Payer: Medicare Other

## 2020-11-27 ENCOUNTER — Encounter (HOSPITAL_COMMUNITY): Payer: Self-pay | Admitting: Orthopedic Surgery

## 2020-11-27 ENCOUNTER — Ambulatory Visit (HOSPITAL_COMMUNITY): Payer: Medicare Other | Admitting: Anesthesiology

## 2020-11-27 ENCOUNTER — Other Ambulatory Visit: Payer: Self-pay

## 2020-11-27 DIAGNOSIS — Z7901 Long term (current) use of anticoagulants: Secondary | ICD-10-CM | POA: Insufficient documentation

## 2020-11-27 DIAGNOSIS — Z96651 Presence of right artificial knee joint: Secondary | ICD-10-CM

## 2020-11-27 DIAGNOSIS — I495 Sick sinus syndrome: Secondary | ICD-10-CM

## 2020-11-27 DIAGNOSIS — I10 Essential (primary) hypertension: Secondary | ICD-10-CM | POA: Insufficient documentation

## 2020-11-27 DIAGNOSIS — M1711 Unilateral primary osteoarthritis, right knee: Secondary | ICD-10-CM | POA: Insufficient documentation

## 2020-11-27 DIAGNOSIS — I4819 Other persistent atrial fibrillation: Secondary | ICD-10-CM | POA: Diagnosis not present

## 2020-11-27 DIAGNOSIS — I4891 Unspecified atrial fibrillation: Secondary | ICD-10-CM | POA: Diagnosis not present

## 2020-11-27 DIAGNOSIS — I480 Paroxysmal atrial fibrillation: Secondary | ICD-10-CM | POA: Insufficient documentation

## 2020-11-27 DIAGNOSIS — Z96652 Presence of left artificial knee joint: Secondary | ICD-10-CM | POA: Insufficient documentation

## 2020-11-27 DIAGNOSIS — Z950 Presence of cardiac pacemaker: Secondary | ICD-10-CM | POA: Insufficient documentation

## 2020-11-27 DIAGNOSIS — Z79899 Other long term (current) drug therapy: Secondary | ICD-10-CM | POA: Insufficient documentation

## 2020-11-27 HISTORY — PX: TOTAL KNEE ARTHROPLASTY: SHX125

## 2020-11-27 SURGERY — ARTHROPLASTY, KNEE, TOTAL
Anesthesia: Spinal | Site: Knee | Laterality: Right

## 2020-11-27 MED ORDER — TRANEXAMIC ACID-NACL 1000-0.7 MG/100ML-% IV SOLN
1000.0000 mg | Freq: Once | INTRAVENOUS | Status: AC
  Administered 2020-11-27: 1000 mg via INTRAVENOUS
  Filled 2020-11-27: qty 100

## 2020-11-27 MED ORDER — CEFAZOLIN SODIUM-DEXTROSE 2-4 GM/100ML-% IV SOLN
2.0000 g | INTRAVENOUS | Status: AC
  Administered 2020-11-27: 2 g via INTRAVENOUS
  Filled 2020-11-27: qty 100

## 2020-11-27 MED ORDER — METHOCARBAMOL 500 MG PO TABS
500.0000 mg | ORAL_TABLET | Freq: Four times a day (QID) | ORAL | Status: DC | PRN
  Administered 2020-11-27 – 2020-11-28 (×2): 500 mg via ORAL
  Filled 2020-11-27 (×2): qty 1

## 2020-11-27 MED ORDER — PHENYLEPHRINE HCL-NACL 10-0.9 MG/250ML-% IV SOLN
INTRAVENOUS | Status: DC | PRN
  Administered 2020-11-27: 40 ug/min via INTRAVENOUS

## 2020-11-27 MED ORDER — PHENYLEPHRINE HCL (PRESSORS) 10 MG/ML IV SOLN
INTRAVENOUS | Status: DC | PRN
  Administered 2020-11-27 (×4): 120 ug via INTRAVENOUS

## 2020-11-27 MED ORDER — HYDROMORPHONE HCL 1 MG/ML IJ SOLN
0.5000 mg | INTRAMUSCULAR | Status: DC | PRN
Start: 2020-11-27 — End: 2020-11-27

## 2020-11-27 MED ORDER — OXYCODONE HCL 5 MG PO TABS
10.0000 mg | ORAL_TABLET | ORAL | Status: DC | PRN
Start: 2020-11-27 — End: 2020-11-28
  Administered 2020-11-28: 10 mg via ORAL
  Filled 2020-11-27: qty 2

## 2020-11-27 MED ORDER — MENTHOL 3 MG MT LOZG: 1.0000 | LOZENGE | OROMUCOSAL | Status: DC | PRN

## 2020-11-27 MED ORDER — OXYCODONE HCL 5 MG PO TABS
5.0000 mg | ORAL_TABLET | ORAL | Status: DC | PRN
Start: 2020-11-27 — End: 2020-11-28
  Administered 2020-11-27 – 2020-11-28 (×2): 10 mg via ORAL
  Filled 2020-11-27 (×2): qty 2

## 2020-11-27 MED ORDER — CEFAZOLIN SODIUM-DEXTROSE 1-4 GM/50ML-% IV SOLN
1.0000 g | Freq: Four times a day (QID) | INTRAVENOUS | Status: AC
Start: 2020-11-27 — End: 2020-11-27
  Administered 2020-11-27 (×2): 1 g via INTRAVENOUS
  Filled 2020-11-27 (×2): qty 50

## 2020-11-27 MED ORDER — ALUM & MAG HYDROXIDE-SIMETH 200-200-20 MG/5ML PO SUSP: 30.0000 mL | ORAL | Status: DC | PRN

## 2020-11-27 MED ORDER — ACETAMINOPHEN 325 MG PO TABS: 325.0000 mg | ORAL_TABLET | Freq: Four times a day (QID) | ORAL | Status: DC | PRN

## 2020-11-27 MED ORDER — BUPIVACAINE LIPOSOME 1.3 % IJ SUSP
INTRAMUSCULAR | Status: DC | PRN
  Administered 2020-11-27: 20 mL

## 2020-11-27 MED ORDER — ORAL CARE MOUTH RINSE: 15.0000 mL | Freq: Once | OROMUCOSAL | Status: AC

## 2020-11-27 MED ORDER — WARFARIN SODIUM 2.5 MG PO TABS: 2.5000 mg | ORAL_TABLET | ORAL | Status: DC

## 2020-11-27 MED ORDER — POVIDONE-IODINE 10 % EX SWAB: 2.0000 "application " | Freq: Once | CUTANEOUS | Status: DC

## 2020-11-27 MED ORDER — WARFARIN - PHARMACIST DOSING INPATIENT: Freq: Every day | Status: DC

## 2020-11-27 MED ORDER — POLYETHYLENE GLYCOL 3350 17 G PO PACK: 17.0000 g | PACK | Freq: Every day | ORAL | Status: DC | PRN

## 2020-11-27 MED ORDER — WARFARIN SODIUM 7.5 MG PO TABS
7.5000 mg | ORAL_TABLET | Freq: Once | ORAL | Status: AC
  Administered 2020-11-27: 7.5 mg via ORAL
  Filled 2020-11-27: qty 1

## 2020-11-27 MED ORDER — METOCLOPRAMIDE HCL 5 MG PO TABS
5.0000 mg | ORAL_TABLET | Freq: Three times a day (TID) | ORAL | Status: DC | PRN
Start: 2020-11-27 — End: 2020-11-28

## 2020-11-27 MED ORDER — METHOCARBAMOL 500 MG IVPB - SIMPLE MED
INTRAVENOUS | Status: AC
  Filled 2020-11-27: qty 50

## 2020-11-27 MED ORDER — MAGNESIUM CITRATE PO SOLN: 1.0000 | Freq: Once | ORAL | Status: DC | PRN

## 2020-11-27 MED ORDER — ACETAMINOPHEN 500 MG PO TABS
ORAL_TABLET | ORAL | Status: AC
  Filled 2020-11-27: qty 2

## 2020-11-27 MED ORDER — FENTANYL CITRATE (PF) 100 MCG/2ML IJ SOLN
50.0000 ug | INTRAMUSCULAR | Status: AC
  Administered 2020-11-27: 50 ug via INTRAVENOUS
  Filled 2020-11-27: qty 2

## 2020-11-27 MED ORDER — HYDROMORPHONE HCL 1 MG/ML IJ SOLN
0.2500 mg | INTRAMUSCULAR | Status: DC | PRN
  Administered 2020-11-27: 0.5 mg via INTRAVENOUS

## 2020-11-27 MED ORDER — ONDANSETRON HCL 4 MG/2ML IJ SOLN
INTRAMUSCULAR | Status: DC | PRN
  Administered 2020-11-27: 4 mg via INTRAVENOUS

## 2020-11-27 MED ORDER — TRANEXAMIC ACID-NACL 1000-0.7 MG/100ML-% IV SOLN
1000.0000 mg | INTRAVENOUS | Status: AC
  Administered 2020-11-27: 1000 mg via INTRAVENOUS
  Filled 2020-11-27: qty 100

## 2020-11-27 MED ORDER — POTASSIUM CHLORIDE IN NACL 20-0.9 MEQ/L-% IV SOLN
INTRAVENOUS | Status: DC
  Filled 2020-11-27 (×3): qty 1000

## 2020-11-27 MED ORDER — TRAMADOL HCL 50 MG PO TABS
50.0000 mg | ORAL_TABLET | Freq: Four times a day (QID) | ORAL | Status: DC
  Administered 2020-11-27 – 2020-11-28 (×4): 50 mg via ORAL
  Filled 2020-11-27 (×4): qty 1

## 2020-11-27 MED ORDER — ONDANSETRON HCL 4 MG/2ML IJ SOLN: 4.0000 mg | Freq: Once | INTRAMUSCULAR | Status: DC | PRN

## 2020-11-27 MED ORDER — PHENOL 1.4 % MT LIQD: 1.0000 | OROMUCOSAL | Status: DC | PRN

## 2020-11-27 MED ORDER — FERROUS GLUCONATE 324 (38 FE) MG PO TABS
324.0000 mg | ORAL_TABLET | Freq: Every day | ORAL | Status: DC
  Administered 2020-11-28: 324 mg via ORAL
  Filled 2020-11-27: qty 1

## 2020-11-27 MED ORDER — DEXAMETHASONE SODIUM PHOSPHATE 4 MG/ML IJ SOLN
INTRAMUSCULAR | Status: DC | PRN
  Administered 2020-11-27: 5 mg via INTRAVENOUS

## 2020-11-27 MED ORDER — ALPRAZOLAM 0.5 MG PO TABS
0.5000 mg | ORAL_TABLET | Freq: Three times a day (TID) | ORAL | Status: DC | PRN
  Administered 2020-11-27: 0.5 mg via ORAL
  Filled 2020-11-27: qty 1

## 2020-11-27 MED ORDER — ACETAMINOPHEN 500 MG PO TABS
1000.0000 mg | ORAL_TABLET | Freq: Four times a day (QID) | ORAL | Status: AC
  Administered 2020-11-27 – 2020-11-28 (×4): 1000 mg via ORAL
  Filled 2020-11-27 (×3): qty 2

## 2020-11-27 MED ORDER — LACTATED RINGERS IV SOLN: INTRAVENOUS | Status: DC

## 2020-11-27 MED ORDER — ACETAMINOPHEN 500 MG PO TABS
1000.0000 mg | ORAL_TABLET | Freq: Once | ORAL | Status: AC
  Administered 2020-11-27: 1000 mg via ORAL
  Filled 2020-11-27: qty 2

## 2020-11-27 MED ORDER — PROPRANOLOL HCL 20 MG PO TABS
20.0000 mg | ORAL_TABLET | Freq: Three times a day (TID) | ORAL | Status: DC
  Administered 2020-11-27 – 2020-11-28 (×4): 20 mg via ORAL
  Filled 2020-11-27 (×4): qty 1

## 2020-11-27 MED ORDER — PHENYLEPHRINE HCL-NACL 10-0.9 MG/250ML-% IV SOLN
0.0000 ug/min | INTRAVENOUS | Status: DC
Start: 2020-11-27 — End: 2020-11-27

## 2020-11-27 MED ORDER — SODIUM CHLORIDE 0.9 % IR SOLN
Status: DC | PRN
  Administered 2020-11-27: 1000 mL

## 2020-11-27 MED ORDER — LOSARTAN POTASSIUM 25 MG PO TABS
25.0000 mg | ORAL_TABLET | ORAL | Status: DC
  Administered 2020-11-28: 25 mg via ORAL
  Filled 2020-11-27 (×2): qty 1

## 2020-11-27 MED ORDER — 0.9 % SODIUM CHLORIDE (POUR BTL) OPTIME
TOPICAL | Status: DC | PRN
  Administered 2020-11-27: 1000 mL

## 2020-11-27 MED ORDER — ONDANSETRON HCL 4 MG/2ML IJ SOLN: 4.0000 mg | Freq: Four times a day (QID) | INTRAMUSCULAR | Status: DC | PRN

## 2020-11-27 MED ORDER — FENTANYL CITRATE (PF) 100 MCG/2ML IJ SOLN
INTRAMUSCULAR | Status: AC
  Filled 2020-11-27: qty 2

## 2020-11-27 MED ORDER — TAMSULOSIN HCL 0.4 MG PO CAPS
0.4000 mg | ORAL_CAPSULE | Freq: Every day | ORAL | Status: DC
  Administered 2020-11-28: 0.4 mg via ORAL
  Filled 2020-11-27: qty 1

## 2020-11-27 MED ORDER — DIPHENHYDRAMINE HCL 12.5 MG/5ML PO ELIX: 12.5000 mg | ORAL_SOLUTION | ORAL | Status: DC | PRN

## 2020-11-27 MED ORDER — DEXAMETHASONE SODIUM PHOSPHATE 10 MG/ML IJ SOLN
10.0000 mg | Freq: Once | INTRAMUSCULAR | Status: DC
  Filled 2020-11-27: qty 1

## 2020-11-27 MED ORDER — GABAPENTIN 100 MG PO CAPS
200.0000 mg | ORAL_CAPSULE | Freq: Three times a day (TID) | ORAL | Status: DC
  Administered 2020-11-27 – 2020-11-28 (×4): 200 mg via ORAL
  Filled 2020-11-27 (×4): qty 2

## 2020-11-27 MED ORDER — FENTANYL CITRATE (PF) 100 MCG/2ML IJ SOLN
INTRAMUSCULAR | Status: DC | PRN
  Administered 2020-11-27 (×2): 50 ug via INTRAVENOUS

## 2020-11-27 MED ORDER — PANTOPRAZOLE SODIUM 40 MG PO TBEC
40.0000 mg | DELAYED_RELEASE_TABLET | Freq: Every day | ORAL | Status: DC
  Administered 2020-11-27 – 2020-11-28 (×2): 40 mg via ORAL
  Filled 2020-11-27 (×2): qty 1

## 2020-11-27 MED ORDER — PHENYLEPHRINE HCL-NACL 10-0.9 MG/250ML-% IV SOLN
INTRAVENOUS | Status: AC
  Filled 2020-11-27: qty 250

## 2020-11-27 MED ORDER — ONDANSETRON HCL 4 MG PO TABS: 4.0000 mg | ORAL_TABLET | Freq: Four times a day (QID) | ORAL | Status: DC | PRN

## 2020-11-27 MED ORDER — POVIDONE-IODINE 10 % EX SWAB
2.0000 "application " | Freq: Once | CUTANEOUS | Status: AC
  Administered 2020-11-27: 2 via TOPICAL

## 2020-11-27 MED ORDER — METHOCARBAMOL 500 MG IVPB - SIMPLE MED
500.0000 mg | Freq: Four times a day (QID) | INTRAVENOUS | Status: DC | PRN
  Administered 2020-11-27: 500 mg via INTRAVENOUS
  Filled 2020-11-27: qty 50

## 2020-11-27 MED ORDER — CHLORHEXIDINE GLUCONATE 0.12 % MT SOLN
15.0000 mL | Freq: Once | OROMUCOSAL | Status: AC
  Administered 2020-11-27: 15 mL via OROMUCOSAL

## 2020-11-27 MED ORDER — MIDAZOLAM HCL 2 MG/2ML IJ SOLN
1.0000 mg | INTRAMUSCULAR | Status: DC
  Filled 2020-11-27: qty 2

## 2020-11-27 MED ORDER — METOCLOPRAMIDE HCL 5 MG/ML IJ SOLN: 5.0000 mg | Freq: Three times a day (TID) | INTRAMUSCULAR | Status: DC | PRN

## 2020-11-27 MED ORDER — BISACODYL 10 MG RE SUPP: 10.0000 mg | Freq: Every day | RECTAL | Status: DC | PRN

## 2020-11-27 MED ORDER — HYDROMORPHONE HCL 1 MG/ML IJ SOLN
INTRAMUSCULAR | Status: AC
  Administered 2020-11-27: 0.5 mg via INTRAVENOUS
  Filled 2020-11-27: qty 1

## 2020-11-27 MED ORDER — PROPOFOL 500 MG/50ML IV EMUL
INTRAVENOUS | Status: DC | PRN
  Administered 2020-11-27: 75 ug/kg/min via INTRAVENOUS

## 2020-11-27 MED ORDER — DOCUSATE SODIUM 100 MG PO CAPS
100.0000 mg | ORAL_CAPSULE | Freq: Two times a day (BID) | ORAL | Status: DC
  Administered 2020-11-27 – 2020-11-28 (×2): 100 mg via ORAL
  Filled 2020-11-27 (×2): qty 1

## 2020-11-27 MED ORDER — BUPIVACAINE HCL (PF) 0.5 % IJ SOLN
INTRAMUSCULAR | Status: DC | PRN
  Administered 2020-11-27: 20 mL via PERINEURAL

## 2020-11-27 MED ORDER — SODIUM CHLORIDE 0.9% FLUSH
INTRAVENOUS | Status: DC | PRN
  Administered 2020-11-27: 50 mL

## 2020-11-27 MED ORDER — HYDROMORPHONE HCL 1 MG/ML IJ SOLN
0.5000 mg | INTRAMUSCULAR | Status: DC | PRN
  Administered 2020-11-27: 1 mg via INTRAVENOUS
  Filled 2020-11-27: qty 1

## 2020-11-27 MED ORDER — FLUOXETINE HCL 20 MG PO CAPS
40.0000 mg | ORAL_CAPSULE | Freq: Every day | ORAL | Status: DC
  Administered 2020-11-28: 40 mg via ORAL
  Filled 2020-11-27: qty 2

## 2020-11-27 SURGICAL SUPPLY — 53 items
BAG COUNTER SPONGE SURGICOUNT (BAG) IMPLANT
BAG SPNG CNTER NS LX DISP (BAG)
BLADE HEX COATED 2.75 (ELECTRODE) ×2 IMPLANT
BLADE SAG 18X100X1.27 (BLADE) ×2 IMPLANT
BLADE SAGITTAL 25.0X1.37X90 (BLADE) ×2 IMPLANT
BLADE SURG 15 STRL LF DISP TIS (BLADE) ×1 IMPLANT
BLADE SURG 15 STRL SS (BLADE) ×2
BLADE SURG SZ10 CARB STEEL (BLADE) ×4 IMPLANT
BNDG CMPR MED 10X6 ELC LF (GAUZE/BANDAGES/DRESSINGS) ×1
BNDG ELASTIC 6X10 VLCR STRL LF (GAUZE/BANDAGES/DRESSINGS) ×2 IMPLANT
BOWL SMART MIX CTS (DISPOSABLE) IMPLANT
BSPLAT TIB 5 KN TRITANIUM (Knees) ×1 IMPLANT
CLSR STERI-STRIP ANTIMIC 1/2X4 (GAUZE/BANDAGES/DRESSINGS) ×2 IMPLANT
COVER SURGICAL LIGHT HANDLE (MISCELLANEOUS) ×2 IMPLANT
CUFF TOURN SGL QUICK 34 (TOURNIQUET CUFF) ×2
CUFF TRNQT CYL 34X4.125X (TOURNIQUET CUFF) ×1 IMPLANT
DECANTER SPIKE VIAL GLASS SM (MISCELLANEOUS) ×2 IMPLANT
DRAPE U-SHAPE 47X51 STRL (DRAPES) ×2 IMPLANT
DRSG MEPILEX BORDER 4X12 (GAUZE/BANDAGES/DRESSINGS) ×2 IMPLANT
DURAPREP 26ML APPLICATOR (WOUND CARE) ×4 IMPLANT
FEMORAL POSTERIOR SZ5 RT (Femur) IMPLANT
GLOVE SRG 8 PF TXTR STRL LF DI (GLOVE) ×1 IMPLANT
GLOVE SURG ENC MOIS LTX SZ7.5 (GLOVE) ×2 IMPLANT
GLOVE SURG POLYISO LF SZ7.5 (GLOVE) ×2 IMPLANT
GLOVE SURG UNDER POLY LF SZ7.5 (GLOVE) ×2 IMPLANT
GLOVE SURG UNDER POLY LF SZ8 (GLOVE) ×2
GOWN STRL REUS W/TWL LRG LVL3 (GOWN DISPOSABLE) ×2 IMPLANT
GOWN STRL REUS W/TWL XL LVL3 (GOWN DISPOSABLE) ×2 IMPLANT
HANDPIECE INTERPULSE COAX TIP (DISPOSABLE) ×2
HOLDER FOLEY CATH W/STRAP (MISCELLANEOUS) ×1 IMPLANT
IMMOBILIZER KNEE 22 UNIV (SOFTGOODS) ×2 IMPLANT
INSERT TIB BEARING PS 5 9 (Miscellaneous) ×1 IMPLANT
KIT TURNOVER KIT A (KITS) ×2 IMPLANT
KNEE PATELLA ASYMMETRIC 10X35 (Knees) ×1 IMPLANT
KNEE TIBIAL COMPONENT SZ5 (Knees) ×1 IMPLANT
MANIFOLD NEPTUNE II (INSTRUMENTS) ×2 IMPLANT
NS IRRIG 1000ML POUR BTL (IV SOLUTION) ×2 IMPLANT
PACK ICE MAXI GEL EZY WRAP (MISCELLANEOUS) ×2 IMPLANT
PACK TOTAL KNEE CUSTOM (KITS) ×2 IMPLANT
PENCIL SMOKE EVACUATOR (MISCELLANEOUS) IMPLANT
PIN FLUTED HEDLESS FIX 3.5X1/8 (PIN) ×1 IMPLANT
POSTERIOR FEMORAL SZ5 RT (Femur) ×2 IMPLANT
PROTECTOR NERVE ULNAR (MISCELLANEOUS) ×2 IMPLANT
SET HNDPC FAN SPRY TIP SCT (DISPOSABLE) ×1 IMPLANT
SUT MNCRL AB 3-0 PS2 18 (SUTURE) ×2 IMPLANT
SUT VIC AB 0 CT1 36 (SUTURE) ×2 IMPLANT
SUT VIC AB 1 CT1 36 (SUTURE) ×4 IMPLANT
SUT VIC AB 2-0 CT1 27 (SUTURE) ×2
SUT VIC AB 2-0 CT1 TAPERPNT 27 (SUTURE) ×1 IMPLANT
TRAY FOLEY MTR SLVR 14FR STAT (SET/KITS/TRAYS/PACK) IMPLANT
TRAY FOLEY MTR SLVR 16FR STAT (SET/KITS/TRAYS/PACK) ×1 IMPLANT
TUBE SUCTION HIGH CAP CLEAR NV (SUCTIONS) ×2 IMPLANT
WRAP KNEE MAXI GEL POST OP (GAUZE/BANDAGES/DRESSINGS) ×1 IMPLANT

## 2020-11-27 NOTE — Anesthesia Postprocedure Evaluation (Signed)
Anesthesia Post Note  Patient: [name] [name]  Procedure(s) Performed: TOTAL KNEE ARTHROPLASTY (Right: Knee)     Patient location during evaluation: PACU Anesthesia Type: Spinal Level of consciousness: oriented and awake and alert Pain management: pain level controlled Vital Signs Assessment: post-procedure vital signs reviewed and stable Respiratory status: spontaneous breathing, respiratory function stable and patient connected to nasal cannula oxygen Cardiovascular status: blood pressure returned to baseline and stable Postop Assessment: no headache, no backache and no apparent nausea or vomiting Anesthetic complications: no   No notable events documented.  Last Vitals:  Vitals:   11/27/20 1345 11/27/20 1400  BP:  (!) 146/92  Pulse: 60 60  Resp: 14 14  Temp:    SpO2: 95% 94%    Last Pain:  Vitals:   11/27/20 1400  TempSrc:   PainSc: 6                  Teran Daughenbaugh S

## 2020-11-27 NOTE — Anesthesia Procedure Notes (Signed)
Anesthesia Regional Block: Adductor canal block   Pre-Anesthetic Checklist: , timeout performed,  Correct Patient, Correct Site, Correct Laterality,  Correct Procedure, Correct Position, site marked,  Risks and benefits discussed,  Surgical consent,  Pre-op evaluation,  At surgeon's request and post-op pain management  Laterality: Right  Prep: chloraprep       Needles:  Injection technique: Single-shot  Needle Type: Echogenic Needle     Needle Length: 9cm      Additional Needles:   Procedures:,,,, ultrasound used (permanent image in chart),,    Narrative:  Start time: 11/27/2020 9:24 AM End time: 11/27/2020 9:30 AM Injection made incrementally with aspirations every 5 mL.  Performed by: Personally  Anesthesiologist: Myrtie Soman, MD  Additional Notes: Patient tolerated the procedure well without complications

## 2020-11-27 NOTE — Op Note (Signed)
DATE OF SURGERY:  11/27/2020 TIME: 11:42 AM  PATIENT NAME:  [name] [name]   AGE: 80 y.o.    PRE-OPERATIVE DIAGNOSIS:  OA RIGHT KNEE  POST-OPERATIVE DIAGNOSIS:  Same  PROCEDURE:  Procedure(s): TOTAL KNEE ARTHROPLASTY   SURGEON:  Renette Butters, MD   ASSISTANT:  Aggie Moats, PA-C, he was present and scrubbed throughout the case, critical for completion in a timely fashion, and for retraction, instrumentation, and closure.    OPERATIVE IMPLANTS: [employer] Triathlon PS. Press fit knee  Femur size 5, Tibia size 5, Patella size 35 3-peg oval button, with a 9 mm polyethylene insert.   PREOPERATIVE INDICATIONS:  [name] [name] is a 80 y.o. year old male with end stage bone on bone degenerative arthritis of the knee who failed conservative treatment, including injections, antiinflammatories, activity modification, and assistive devices, and had significant impairment of their activities of daily living, and elected for Total Knee Arthroplasty.   The risks, benefits, and alternatives were discussed at length including but not limited to the risks of infection, bleeding, nerve injury, stiffness, blood clots, the need for revision surgery, cardiopulmonary complications, among others, and they were willing to proceed.   OPERATIVE DESCRIPTION:  The patient was brought to the operative room and placed in a supine position.  General anesthesia was administered.  IV antibiotics were given.  The lower extremity was prepped and draped in the usual sterile fashion.  Time out was performed.  The leg was elevated and exsanguinated and the tourniquet was inflated.  Anterior approach was performed.  The patella was everted and osteophytes were removed.  The anterior horn of the medial and lateral meniscus was removed.   The distal femur was opened with the drill and the intramedullary distal femoral cutting jig was utilized, set at 5 degrees resecting 8 mm off the distal femur.  Care was taken to  protect the collateral ligaments.  The distal femoral sizing jig was applied, taking care to avoid notching.  Then the 4-in-1 cutting jig was applied and the anterior and posterior femur was cut, along with the chamfer cuts.  All posterior osteophytes were removed.  The flexion gap was then measured and was symmetric with the extension gap.  Then the extramedullary tibial cutting jig was utilized making the appropriate cut using the anterior tibial crest as a reference building in appropriate posterior slope.  Care was taken during the cut to protect the medial and collateral ligaments.  The proximal tibia was removed along with the posterior horns of the menisci.  The PCL was sacrificed.    The extensor gap was measured and was approximately 44mm.    I completed the distal femoral preparation using the appropriate jig to prepare the box.  The patella was then measured, and cut with the saw.    The proximal tibia sized and prepared accordingly with the reamer and the punch, and then all components were trialed with the above sized poly insert.  The knee was found to have excellent balance and full motion.    The above named components were then impacted into place and Poly tibial piece and patella were inserted.  I was very happy with his stability and ROM  I performed a periarticular injection with marcaine and toradol  The knee was easily taken through a range of motion and the patella tracked well and the knee irrigated copiously and the parapatellar and subcutaneous tissue closed with vicryl, and monocryl with steri strips for the skin.  The  incision was dressed with sterile gauze and the tourniquet released and the patient was awakened and returned to the PACU in stable and satisfactory condition.  There were no complications.  Total tourniquet time was roughly 60 minutes.   POSTOPERATIVE PLAN: post op Abx, DVT px: SCD's, TED's, Early ambulation and chemical px

## 2020-11-27 NOTE — Anesthesia Preprocedure Evaluation (Addendum)
Anesthesia Evaluation  Patient identified by MRN, date of birth, ID band Patient awake    Reviewed: Allergy & Precautions, NPO status , Patient's Chart, lab work & pertinent test results  History of Anesthesia Complications (+) PONV and DIFFICULT AIRWAY  Airway Mallampati: II  TM Distance: >3 FB Neck ROM: Full    Dental no notable dental hx.    Pulmonary neg pulmonary ROS,    Pulmonary exam normal breath sounds clear to auscultation       Cardiovascular hypertension, Pt. on medications Normal cardiovascular exam+ dysrhythmias Atrial Fibrillation + pacemaker  Rhythm:Regular Rate:Normal     Neuro/Psych negative neurological ROS  negative psych ROS   GI/Hepatic negative GI ROS, Neg liver ROS,   Endo/Other  negative endocrine ROS  Renal/GU negative Renal ROS  negative genitourinary   Musculoskeletal negative musculoskeletal ROS (+)   Abdominal   Peds negative pediatric ROS (+)  Hematology Anticoagulated on coumadin Last dose 6/28   Anesthesia Other Findings   Reproductive/Obstetrics negative OB ROS                            Anesthesia Physical Anesthesia Plan  ASA: 3  Anesthesia Plan: Spinal   Post-op Pain Management:  Regional for Post-op pain   Induction: Intravenous  PONV Risk Score and Plan: 2 and Ondansetron, Dexamethasone and Treatment may vary due to age or medical condition  Airway Management Planned: Simple Face Mask  Additional Equipment:   Intra-op Plan:   Post-operative Plan:   Informed Consent: I have reviewed the patients History and Physical, chart, labs and discussed the procedure including the risks, benefits and alternatives for the proposed anesthesia with the patient or authorized representative who has indicated his/her understanding and acceptance.     Dental advisory given  Plan Discussed with: CRNA and Surgeon  Anesthesia Plan Comments:          Anesthesia Quick Evaluation

## 2020-11-27 NOTE — Progress Notes (Signed)
Orthopedic Tech Progress Note Patient Details:  [name] [name] Aug 14, 1940 [id number]  Patient ID: [name], male   DOB: 11/09/1940, 80 y.o.   MRN: [id number]  Kennis Carina 11/27/2020, 7:15 PM Over head frame and trapeze appliued per RN verbal. Patient advised not to use arm with prior rotator cuff surgery.

## 2020-11-27 NOTE — Transfer of Care (Signed)
Immediate Anesthesia Transfer of Care Note  Patient: [name] [name]  Procedure(s) Performed: TOTAL KNEE ARTHROPLASTY (Right: Knee)  Patient Location: PACU  Anesthesia Type:Spinal  Level of Consciousness: sedated, patient cooperative and responds to stimulation  Airway & Oxygen Therapy: Patient Spontanous Breathing and Patient connected to face mask oxygen  Post-op Assessment: Report given to RN and Post -op Vital signs reviewed and stable  Post vital signs: Reviewed and stable  Last Vitals:  Vitals Value Taken Time  BP 120/81 11/27/20 1226  Temp    Pulse 61 11/27/20 1229  Resp 12 11/27/20 1229  SpO2 100 % 11/27/20 1229  Vitals shown include unvalidated device data.  Last Pain:  Vitals:   11/27/20 0930  TempSrc:   PainSc: 0-No pain      Patients Stated Pain Goal: 2 (01/56/15 3794)  Complications: No notable events documented.

## 2020-11-27 NOTE — Interval H&P Note (Signed)
History and Physical Interval Note:  11/27/2020 9:01 AM  [name] [name]  has presented today for surgery, with the diagnosis of OA RIGHT KNEE.  The various methods of treatment have been discussed with the patient and family. After consideration of risks, benefits and other options for treatment, the patient has consented to  Procedure(s): TOTAL KNEE ARTHROPLASTY (Right) as a surgical intervention.  The patient's history has been reviewed, patient examined, no change in status, stable for surgery.  I have reviewed the patient's chart and labs.  Questions were answered to the patient's satisfaction.     Renette Butters

## 2020-11-27 NOTE — Progress Notes (Signed)
ANTICOAGULATION CONSULT NOTE - Initial Consult  Pharmacy Consult for Warfarin Indication: atrial fibrillation and VTE prophylaxis  Allergies  Allergen Reactions   Iohexol Hives    POST MYELOGRAM.  PT ALSO SAID THIS HAPPENED A YEAR AGO WITH A MYELOGRAM AND WITH ESI'S., Onset Date: [id number]    Meperidine Hcl Other (See Comments)    hallucinations   Demerol Hcl [Meperidine] Other (See Comments)    Hallucinations   Prednisone Other (See Comments)    Redness of skin & jittery    Patient Measurements: Height: 5\' 6"  (167.6 cm) Weight: 75.3 kg (166 lb 0.1 oz) IBW/kg (Calculated) : 63.8  Vital Signs: Temp: 97.5 F (36.4 C) (07/05 1230) Temp Source: Oral (07/05 0814) BP: 108/72 (07/05 1245) Pulse Rate: 60 (07/05 1245)  Labs: No results for input(s): HGB, HCT, PLT, APTT, LABPROT, INR, HEPARINUNFRC, HEPRLOWMOCWT, CREATININE, CKTOTAL, CKMB, TROPONINIHS in the last 72 hours.  Estimated Creatinine Clearance: 58.1 mL/min (by C-G formula based on SCr of 0.9 mg/dL).   Medical History: Past Medical History:  Diagnosis Date   Anemia    since post op- knee replacement    Anxiety    takes xanax 2 times per day, taken mostly for ringing in ears     Arthritis of knee, right    end-stage   Bronchitis    Bursitis    Complication of anesthesia    irritation in throat postop, makes a-fib worse    Depression    DJD (degenerative joint disease)    OA- "everywhere"   Dysrhythmia    Family history of anesthesia complication    sister with nausia and vomiting   Gout    Heart murmur    HTN (hypertension)    Hyperlipidemia    Migraines    Paroxysmal atrial fibrillation (HCC)    PONV (postoperative nausea and vomiting)    Presence of permanent cardiac pacemaker    Tinnitus     Medications:  Scheduled:   bupivacaine liposome  20 mL Other Once   midazolam  1-2 mg Intravenous UD   Infusions:   lactated ringers 10 mL/hr at 11/27/20 1010   phenylephrine (NEO-SYNEPHRINE) Adult infusion      PRN: 0.9 % irrigation (POUR BTL), bupivacaine liposome, HYDROmorphone (DILAUDID) injection, ondansetron (ZOFRAN) IV, sodium chloride flush, sodium chloride irrigation  Assessment: 80 yo male on chronic warfarin for hx afib - last dose taken 6/29.  He is s/p R TKA today and Pharmacy is consulted to resume warfarin dosing post-op.  Per review of outpatient clinical notes, home dose is 2.5mg  on Mon, Tues, Wed, Fri, and Sat; 5mg  on Thurs and Sun. INR almost therapeutic (1.9) on 11/19/20.  Goal of Therapy:  INR 2-3 Monitor platelets by anticoagulation protocol: Yes   Plan:  Warfairn 7.5mg  PO x 1 today Daily PT/INR  Peggyann Juba, PharmD, BCPS Pharmacy: [phone] 11/27/2020,12:51 PM

## 2020-11-27 NOTE — Progress Notes (Signed)
Assisted Dr. Rose with right, ultrasound guided, adductor canal block. Side rails up, monitors on throughout procedure. See vital signs in flow sheet. Tolerated Procedure well.  

## 2020-11-27 NOTE — Anesthesia Procedure Notes (Signed)
Anesthesia Procedure Image    

## 2020-11-28 ENCOUNTER — Encounter (HOSPITAL_COMMUNITY): Payer: Self-pay | Admitting: Orthopedic Surgery

## 2020-11-28 LAB — PROTIME-INR
INR: 1.1 (ref 0.8–1.2)
Prothrombin Time: 14.4 seconds (ref 11.4–15.2)

## 2020-11-28 LAB — CBC
HCT: 37.3 % — ABNORMAL LOW (ref 39.0–52.0)
Hemoglobin: 12.4 g/dL — ABNORMAL LOW (ref 13.0–17.0)
MCH: 32 pg (ref 26.0–34.0)
MCHC: 33.2 g/dL (ref 30.0–36.0)
MCV: 96.1 fL (ref 80.0–100.0)
Platelets: 192 10*3/uL (ref 150–400)
RBC: 3.88 MIL/uL — ABNORMAL LOW (ref 4.22–5.81)
RDW: 14.6 % (ref 11.5–15.5)
WBC: 10 10*3/uL (ref 4.0–10.5)
nRBC: 0 % (ref 0.0–0.2)

## 2020-11-28 MED ORDER — ONDANSETRON 4 MG PO TBDP: 4.0000 mg | ORAL_TABLET | Freq: Two times a day (BID) | ORAL | 0 refills | Status: DC | PRN

## 2020-11-28 MED ORDER — WARFARIN SODIUM 5 MG PO TABS
7.5000 mg | ORAL_TABLET | Freq: Once | ORAL | Status: AC
  Administered 2020-11-28: 7.5 mg via ORAL
  Filled 2020-11-28: qty 1

## 2020-11-28 MED ORDER — GABAPENTIN 300 MG PO CAPS: 300.0000 mg | ORAL_CAPSULE | Freq: Two times a day (BID) | ORAL | 0 refills | Status: AC

## 2020-11-28 MED ORDER — OMEPRAZOLE MAGNESIUM 20 MG PO TBEC: 20.0000 mg | DELAYED_RELEASE_TABLET | Freq: Every day | ORAL | 0 refills | Status: DC

## 2020-11-28 MED ORDER — METHOCARBAMOL 750 MG PO TABS: 750.0000 mg | ORAL_TABLET | Freq: Three times a day (TID) | ORAL | 0 refills | Status: DC | PRN

## 2020-11-28 MED ORDER — OXYCODONE HCL 5 MG PO TABS: 5.0000 mg | ORAL_TABLET | Freq: Three times a day (TID) | ORAL | 0 refills | Status: AC | PRN

## 2020-11-28 MED ORDER — ACETAMINOPHEN 500 MG PO TABS: 1000.0000 mg | ORAL_TABLET | Freq: Four times a day (QID) | ORAL | 0 refills | Status: DC | PRN

## 2020-11-28 NOTE — TOC Transition Note (Signed)
Transition of Care [facility]) - CM/SW Discharge Note   Patient Details  Name: CUSTER PIMENTA MRN: [id number] Date of Birth: [birth year]  [facility] CM/SW Contact:  Ross Ludwig, LCSW Phone Number: 11/28/2020, 3:56 PM   Clinical Narrative:     Patient will be going home with home health through [place].  CSW signing off please reconsult with any other social work needs, home health agency has been notified of planned discharge.   Final next level of care: [place] Barriers to Discharge: Barriers Resolved   Patient Goals and CMS Choice Patient states their goals for this hospitalization and ongoing recovery are:: To return back home with home health. CMS Medicare.gov Compare Post Acute Care list provided to:: Patient Choice offered to / list presented to : Patient  Discharge Placement                       Discharge Plan and Services                DME Arranged: Walker rolling   Date DME Agency Contacted: 11/28/20 Time DME Agency Contacted: [phone]   [place]: [place] Date HH Agency Contacted: 11/28/20 Time HH Agency Contacted: [phone]    Social Determinants of Health (SDOH) Interventions     Readmission Risk Interventions No flowsheet data found.

## 2020-11-28 NOTE — Progress Notes (Signed)
    Subjective: Patient reports pain as mild to moderate. A little episode of nausea this morning. Tolerating diet.  Urinating.   No CP, SOB.  Has worked with PT/OT on mobilization OOB. Eager to go home.   Objective:   VITALS:   Vitals:   11/28/20 0137 11/28/20 0525 11/28/20 0912 11/28/20 1323  BP: (!) 142/98 131/77 (!) 155/78 136/75  Pulse: 67 60 67 60  Resp:   18 19  Temp: 98.6 F (37 C) 97.6 F (36.4 C) 98.6 F (37 C) 98.7 F (37.1 C)  TempSrc:  Oral  Oral  SpO2: 100% 97% 92% 93%  Weight:      Height:       CBC Latest Ref Rng & Units 11/28/2020 11/15/2020 04/29/2018  WBC 4.0 - 10.5 K/uL 10.0 5.8 5.6  Hemoglobin 13.0 - 17.0 g/dL 12.4(L) 13.5 13.3  Hematocrit 39.0 - 52.0 % 37.3(L) 41.2 40.6  Platelets 150 - 400 K/uL 192 218 174   BMP Latest Ref Rng & Units 11/15/2020 04/29/2018 10/28/2013  Glucose 70 - 99 mg/dL 102(H) 98 74  BUN 8 - 23 mg/dL 16 14 12   Creatinine 0.61 - 1.24 mg/dL 0.90 0.98 0.9  Sodium 135 - 145 mmol/L 136 141 138  Potassium 3.5 - 5.1 mmol/L 4.7 4.6 4.7  Chloride 98 - 111 mmol/L 103 104 103  CO2 22 - 32 mmol/L 30 30 30   Calcium 8.9 - 10.3 mg/dL 8.9 8.8(L) 9.3   Intake/Output      07/05 0701 07/06 0700 07/06 0701 07/07 0700   P.O. 720 240   I.V. (mL/kg) 2915.5 (38.7)    IV Piggyback 250    Total Intake(mL/kg) 3885.5 (51.6) 240 (3.2)   Urine (mL/kg/hr) 1650 150 (0.3)   Blood 25    Total Output 1675 150   Net +2210.5 +90           Physical Exam: General: NAD.  Laying in bed, calm Resp: No increased wob Cardio: regular rate and rhythm ABD soft Neurologically intact MSK Neurovascularly intact Sensation intact distally Intact pulses distally Dorsiflexion/Plantar flexion intact Incision: dressing C/D/I KI and ice to the right knee  Assessment: 1 Day Post-Op  S/P Procedure(s) (LRB): TOTAL KNEE ARTHROPLASTY (Right) by Dr. Ernesta Amble. Murphy on 11/27/20  Active Problems:   S/P total knee arthroplasty, right   Plan:  Advance diet Up with  therapy Incentive Spirometry Elevate and Apply ice  Weightbearing: WBAT RLE Insicional and dressing care: Dressings left intact until follow-up and Reinforce dressings as needed Orthopedic device(s):  KI which can be d/c once nerve block wears off Showering: Keep dressing dry VTE prophylaxis:  Warfarin  , SCDs, ambulation Pain control: Continue current regimen Follow - up plan: 2 weeks Contact information:  Edmonia Lynch MD, Aggie Moats PA-C  Dispo: Home today with HHPT already scheduled.    Britt Bottom, PA-C Office [phone] 11/28/2020, 1:39 PM

## 2020-11-28 NOTE — Evaluation (Signed)
Occupational Therapy Evaluation Patient Details Name: [name] [name] MRN: [id number] DOB: [birth year] Today's Date: 11/28/2020    History of Present Illness patient is a 80 year old male who presented on 11/27/20 for end stage osteoarthritis of right knee. patient underwent right total knee replacement on same day. patient had tried conservative measures with no success. patients past medical history is significant for L TKA, right rotator cuff surgery, depression, DJD.   Clinical Impression   Patient is a 80 year old male with above admitting diagnosis. Patient was noted to have increased pain in right knee and recommendations for KI following surgery impacting patients ability to participate in ADLS and functional mobility at prior level. Patient was independent in ADLs and IADLs at prior level. Patient was educated on using AE for LB dressing. Patient verbalized understanding reporting that he had all AE at home from previous surgeries.patient was educated on leg lifter. Patient reported he would look into getting one.  Patient was min A for supine to sit on edge of bed with education on using pseudo leg lifter to get leg to edge of bed. Patient completed oral hygiene sitting on edge of bed with good sitting balance and no BUE support.  Patient was min A for sit to stand with education on proper hand and foot placement. Patient was able to complete transfer with min guard with rolling walker to recliner in room with education on weight shifting for pain in right knee. Patient tolerated session well.     Follow Up Recommendations  Follow surgeon's recommendation for DC plan and follow-up therapies    Equipment Recommendations  Other (comment) (leg lifter, reacher, sock aid, dressing stick)           Precautions / Restrictions Precautions Precautions: Fall;Knee Restrictions Weight Bearing Restrictions: No RLE Weight Bearing: Weight bearing as tolerated      Mobility Bed Mobility Overal  bed mobility: Needs Assistance Bed Mobility: Supine to Sit     Supine to sit: Min assist     General bed mobility comments: patient was educated on using pseudo leg lifter for supine to sit on edge of bed with SUP to complete movemnet of RLE    Transfers Overall transfer level: Needs assistance Equipment used: Rolling walker (2 wheeled) Transfers: Risk manager;Sit to/from Stand Sit to Stand: Min assist Stand pivot transfers: Min guard            Balance Overall balance assessment: Needs assistance Sitting-balance support: Single extremity supported Sitting balance-Leahy Scale: Normal       Standing balance-Leahy Scale: Fair                             ADL either performed or assessed with clinical judgement   ADL Overall ADL's : Needs assistance/impaired Eating/Feeding: Modified independent;Sitting   Grooming: Oral care;Sitting;Modified independent   Upper Body Bathing: Modified independent   Lower Body Bathing: Moderate assistance Lower Body Bathing Details (indicate cue type and reason): Ki in place Upper Body Dressing : Modified independent   Lower Body Dressing: Maximal assistance Lower Body Dressing Details (indicate cue type and reason): KI in place with max A to adjust socks. patient reported having wife support at home and sock aid from last knee surgery on L side. Toilet Transfer: Minimal assistance;Stand-pivot;RW           Functional mobility during ADLs: Minimal assistance;Rolling walker General ADL Comments: with increased time for movement.  Vision   Vision Assessment?: No apparent visual deficits                Pertinent Vitals/Pain Pain Assessment: 0-10 Pain Score: 7  Pain Location: R knee Pain Intervention(s): Monitored during session     Hand Dominance Right   Extremity/Trunk Assessment Upper Extremity Assessment Upper Extremity Assessment: Overall WFL for tasks assessed   Lower Extremity  Assessment Lower Extremity Assessment: Defer to PT evaluation       Communication Communication Communication: No difficulties   Cognition Arousal/Alertness: Awake/alert Behavior During Therapy: WFL for tasks assessed/performed Overall Cognitive Status: Within Functional Limits for tasks assessed                                                      Home Living Family/patient expects to be discharged to:: Private residence Living Arrangements: Spouse/significant other Available Help at Discharge: Family;Available 24 hours/day Type of Home: House Home Access: Stairs to enter [employer] of Steps: 3 Entrance Stairs-Rails: Left (going up) Home Layout: Able to live on main level with bedroom/bathroom;Two level     Bathroom Shower/Tub: Tub/shower unit;Walk-in shower   Bathroom Toilet: Handicapped height Bathroom Accessibility: Yes   Home Equipment: Shower seat;Adaptive equipment;Walker - 2 wheels;Cane - single point;Cane - quad;Wheelchair - manual Adaptive Equipment: Reacher;Sock aid;Long-handled shoe horn;Long-handled sponge        Prior Functioning/Environment Level of Independence: Independent                 OT Problem List: Decreased strength;Impaired balance (sitting and/or standing);Pain;Decreased activity tolerance      OT Treatment/Interventions: Self-care/ADL training;DME and/or AE instruction;Therapeutic activities;Balance training;Patient/family education    OT Goals(Current goals can be found in the care plan section) Acute Rehab OT Goals Patient Stated Goal: patient wants to go back home today with wife. OT Goal Formulation: With patient Time For Goal Achievement: 12/12/20 Potential to Achieve Goals: Good  OT Frequency: Min 2X/week                             AM-PAC OT "6 Clicks" Daily Activity     Outcome Measure Help from another person eating meals?: None Help from another person taking care of  personal grooming?: None Help from another person toileting, which includes using toliet, bedpan, or urinal?: A Little Help from another person bathing (including washing, rinsing, drying)?: A Little Help from another person to put on and taking off regular upper body clothing?: A Little Help from another person to put on and taking off regular lower body clothing?: A Lot 6 Click Score: 19   End of Session Equipment Utilized During Treatment: Gait belt;Rolling walker Nurse Communication:  (nurse cleared patient to participate in session.)  Activity Tolerance: Patient tolerated treatment well Patient left: in chair;with call bell/phone within reach;with chair alarm set  OT Visit Diagnosis: Pain;Muscle weakness (generalized) (M62.81) Pain - Right/Left: Right Pain - part of body: Knee                Time: 6195-0932 OT Time Calculation (min): 38 min Charges:  OT General Charges $OT Visit: 1 Visit OT Evaluation $OT Eval Low Complexity: 1 Low OT Treatments $Self Care/Home Management : 23-37 mins  Jackelyn Poling OTR/L, MS Acute Rehabilitation Department Office# [phone] Pager# [phone]   [facility]  [place] 11/28/2020, 10:13 AM

## 2020-11-28 NOTE — Discharge Summary (Signed)
Physician Discharge Summary  Patient ID: [name] [name] MRN: [id number] DOB/AGE: 1940/10/31 80 y.o.  Admit date: 11/27/2020 Discharge date: 11/28/2020  Admission Diagnoses: right knee osteoarthritis  Discharge Diagnoses:  Active Problems:   S/P total knee arthroplasty, right   Discharged Condition: fair  Hospital Course: Patient underwent a Right TKA by Dr. Percell Miller on 11/27/20 that was without complications. He spend the night in observation for pain control and mobilization. He has passed his PT/OT evals and is ready to go home with HHPT already scheduled.   Consults: None  Significant Diagnostic Studies: none  Treatments: IV hydration, antibiotics: Ancef, analgesia: acetaminophen, Vicodin, Dilaudid, Morphine, and Oxycodone, anticoagulation: warfarin, and surgery: right TKA  Discharge Exam: Blood pressure 136/75, pulse 60, temperature 98.7 F (37.1 C), temperature source Oral, resp. rate 19, height 5\' 6"  (1.676 m), weight 75.3 kg, SpO2 93 %. General appearance: alert, cooperative, and no distress Head: Normocephalic, without obvious abnormality, atraumatic Eyes: conjunctivae/corneas clear. PERRL, EOM's intact. Fundi benign. Resp: clear to auscultation bilaterally Cardio: irregularly irregular rhythm, no click, and no rub GI: soft, non-tender; bowel sounds normal; no masses,  no organomegaly Extremities: extremities normal, atraumatic, no cyanosis or edema and signs of easy ecchymosis Pulses:  L brachial 2+ R brachial 2+  L radial 2+ R radial 2+  L inguinal 2+ R inguinal 2+  L popliteal 2+ R popliteal 2+  L posterior tibial 2+ R posterior tibial 2+  L dorsalis pedis 2+ R dorsalis pedis 2+   Neurologic: Alert and oriented X 3, normal strength and tone. Normal symmetric reflexes. Normal coordination and gait Incision/Wound: c/d/i  Disposition: Discharge disposition: 01-Home or Self Care       Discharge Instructions     Call MD / Call 911   Complete by: As directed     If you experience chest pain or shortness of breath, CALL 911 and be transported to the hospital emergency room.  If you develope a fever above 101 F, pus (white drainage) or increased drainage or redness at the wound, or calf pain, call your surgeon's office.   Diet - low sodium heart healthy   Complete by: As directed    Discharge instructions   Complete by: As directed    You may bear weight as tolerated. Keep your dressing on and dry until follow up. Take medicine to prevent blood clots as directed. Take pain medicine as needed with the goal of transitioning to over the counter medicines.    INSTRUCTIONS AFTER JOINT REPLACEMENT   Remove items at home which could result in a fall. This includes throw rugs or furniture in walking pathways ICE to the affected joint every three hours while awake for 30 minutes at a time, for at least the first 3-5 days, and then as needed for pain and swelling.  Continue to use ice for pain and swelling. You may notice swelling that will progress down to the foot and ankle.  This is normal after surgery.  Elevate your leg when you are not up walking on it.   Continue to use the breathing machine you got in the hospital (incentive spirometer) which will help keep your temperature down.  It is common for your temperature to cycle up and down following surgery, especially at night when you are not up moving around and exerting yourself.  The breathing machine keeps your lungs expanded and your temperature down.   DIET:  As you were doing prior to hospitalization, we recommend a well-balanced diet.  DRESSING /  WOUND CARE / SHOWERING  You may shower 3 days after surgery, but keep the wounds dry during showering.  You may use an occlusive plastic wrap (Press'n Seal for example) with blue painter's tape at edges, NO SOAKING/SUBMERGING IN THE BATHTUB.  If the bandage gets wet, change with a clean dry gauze.  If the incision gets wet, pat the wound dry with a clean  towel.  ACTIVITY  Increase activity slowly as tolerated, but follow the weight bearing instructions below.   No driving for 6 weeks or until further direction given by your physician.  You cannot drive while taking narcotics.  No lifting or carrying greater than 10 lbs. until further directed by your surgeon. Avoid periods of inactivity such as sitting longer than an hour when not asleep. This helps prevent blood clots.  You may return to work once you are authorized by your doctor.    WEIGHT BEARING   Weight bearing as tolerated with assist device (walker, cane, etc) as directed, use it as long as suggested by your surgeon or therapist, typically at least 4-6 weeks.   EXERCISES  Results after joint replacement surgery are often greatly improved when you follow the exercise, range of motion and muscle strengthening exercises prescribed by your doctor. Safety measures are also important to protect the joint from further injury. Any time any of these exercises cause you to have increased pain or swelling, decrease what you are doing until you are comfortable again and then slowly increase them. If you have problems or questions, call your caregiver or physical therapist for advice.   Rehabilitation is important following a joint replacement. After just a few days of immobilization, the muscles of the leg can become weakened and shrink (atrophy).  These exercises are designed to build up the tone and strength of the thigh and leg muscles and to improve motion. Often times heat used for twenty to thirty minutes before working out will loosen up your tissues and help with improving the range of motion but do not use heat for the first two weeks following surgery (sometimes heat can increase post-operative swelling).   These exercises can be done on a training (exercise) mat, on the floor, on a table or on a bed. Use whatever works the best and is most comfortable for you.    Use music or television  while you are exercising so that the exercises are a pleasant break in your day. This will make your life better with the exercises acting as a break in your routine that you can look forward to.   Perform all exercises about fifteen times, three times per day or as directed.  You should exercise both the operative leg and the other leg as well.  Exercises include:   Quad Sets - Tighten up the muscle on the front of the thigh (Quad) and hold for 5-10 seconds.   Straight Leg Raises - With your knee straight (if you were given a brace, keep it on), lift the leg to 60 degrees, hold for 3 seconds, and slowly lower the leg.  Perform this exercise against resistance later as your leg gets stronger.  Leg Slides: Lying on your back, slowly slide your foot toward your buttocks, bending your knee up off the floor (only go as far as is comfortable). Then slowly slide your foot back down until your leg is flat on the floor again.  Angel Wings: Lying on your back spread your legs to the side as far  apart as you can without causing discomfort.  Hamstring Strength:  Lying on your back, push your heel against the floor with your leg straight by tightening up the muscles of your buttocks.  Repeat, but this time bend your knee to a comfortable angle, and push your heel against the floor.  You may put a pillow under the heel to make it more comfortable if necessary.   A rehabilitation program following joint replacement surgery can speed recovery and prevent re-injury in the future due to weakened muscles. Contact your doctor or a physical therapist for more information on knee rehabilitation.    CONSTIPATION  Constipation is defined medically as fewer than three stools per week and severe constipation as less than one stool per week.  Even if you have a regular bowel pattern at home, your normal regimen is likely to be disrupted due to multiple reasons following surgery.  Combination of anesthesia, postoperative  narcotics, change in appetite and fluid intake all can affect your bowels.   YOU MUST use at least one of the following options; they are listed in order of increasing strength to get the job done.  They are all available over the counter, and you may need to use some, POSSIBLY even all of these options:    Drink plenty of fluids (prune juice may be helpful) and high fiber foods Colace 100 mg by mouth twice a day  Senokot for constipation as directed and as needed Dulcolax (bisacodyl), take with full glass of water  Miralax (polyethylene glycol) once or twice a day as needed.  If you have tried all these things and are unable to have a bowel movement in the first 3-4 days after surgery call either your surgeon or your primary doctor.    If you experience loose stools or diarrhea, hold the medications until you stool forms back up.  If your symptoms do not get better within 1 week or if they get worse, check with your doctor.  If you experience "the worst abdominal pain ever" or develop nausea or vomiting, please contact the office immediately for further recommendations for treatment.   ITCHING:  If you experience itching with your medications, try taking only a single pain pill, or even half a pain pill at a time.  You can also use Benadryl over the counter for itching or also to help with sleep.   TED HOSE STOCKINGS:  Use stockings on both legs until for at least 2 weeks or as directed by physician office. They may be removed at night for sleeping.  MEDICATIONS:  See your medication summary on the "After Visit Summary" that nursing will review with you.  You may have some home medications which will be placed on hold until you complete the course of blood thinner medication.  It is important for you to complete the blood thinner medication as prescribed.  Take medicines as prescribed.   You have several different medicines that work in different ways. - Tylenol is for mild to moderate pain.  Try to take this medicine before turning to your narcotic medicines.  - Gabapentin is to reduce pain / inflammation - Robaxin is for muscle spasms - Oxycodone is a narcotic pain medicine.  Take this for severe pain. This medicine can be dehydrating / constipating. - Zofran is for nausea and vomiting. - Omeprazole is for gastric protection. - Warfarin is to prevent blood clots after surgery.   PRECAUTIONS:  If you experience chest pain or shortness of breath -  call 911 immediately for transfer to the hospital emergency department.   If you develop a fever greater that 101 F, purulent drainage from wound, increased redness or drainage from wound, foul odor from the wound/dressing, or calf pain - CONTACT YOUR SURGEON.                                                   FOLLOW-UP APPOINTMENTS:  If you do not already have a post-op appointment, please call the office [phone] for an appointment to be seen by Dr. Percell Miller in 2 weeks.   OTHER INSTRUCTIONS:   MAKE SURE YOU:  Understand these instructions.  Get help right away if you are not doing well or get worse.    Thank you for letting us be a part of your medical care team.  It is a privilege we respect greatly.  We hope these instructions will help you stay on track for a fast and full recovery!   Do not put a pillow under the knee. Place it under the heel.   Complete by: As directed    Post-operative opioid taper instructions:   Complete by: As directed    POST-OPERATIVE OPIOID TAPER INSTRUCTIONS: It is important to wean off of your opioid medication as soon as possible. If you do not need pain medication after your surgery it is ok to stop day one. Opioids include: Codeine, Hydrocodone(Norco, Vicodin), Oxycodone(Percocet, oxycontin) and hydromorphone amongst others.  Long term and even short term use of opiods can cause: Increased pain response Dependence Constipation Depression Respiratory depression And more.  Withdrawal  symptoms can include Flu like symptoms Nausea, vomiting And more Techniques to manage these symptoms Hydrate well Eat regular healthy meals Stay active Use relaxation techniques(deep breathing, meditating, yoga) Do Not substitute Alcohol to help with tapering If you have been on opioids for less than two weeks and do not have pain than it is ok to stop all together.  Plan to wean off of opioids This plan should start within one week post op of your joint replacement. Maintain the same interval or time between taking each dose and first decrease the dose.  Cut the total daily intake of opioids by one tablet each day Next start to increase the time between doses. The last dose that should be eliminated is the evening dose.      Weight bearing as tolerated   Complete by: As directed       Allergies as of 11/28/2020       Reactions   Iohexol Hives   POST MYELOGRAM.  PT ALSO SAID THIS HAPPENED A YEAR AGO WITH A MYELOGRAM AND WITH ESI'S., Onset Date: [id number]   Meperidine Hcl Other (See Comments)   hallucinations   Demerol Hcl [meperidine] Other (See Comments)   Hallucinations   Prednisone Other (See Comments)   Redness of skin & jittery        Medication List     TAKE these medications    acetaminophen 500 MG tablet Commonly known as: TYLENOL Take 2 tablets (1,000 mg total) by mouth every 6 (six) hours as needed for mild pain or moderate pain. What changed:  how much to take when to take this reasons to take this   ALPRAZolam 0.5 MG tablet Commonly known as: XANAX Take 1 tablet (0.5 mg total) by mouth 3 (three)  times daily as needed for anxiety.   Dialyvite Vitamin D 5000 125 MCG (5000 UT) capsule Generic drug: Cholecalciferol Take 5,000 Units by mouth 3 (three) times a week.   diclofenac Sodium 1 % Gel Commonly known as: VOLTAREN Apply 1 application topically daily.   ferrous gluconate 240 (27 FE) MG tablet Commonly known as: FERGON Take 240 mg by mouth  daily.   gabapentin 300 MG capsule Commonly known as: Neurontin Take 1 capsule (300 mg total) by mouth 2 (two) times daily for 14 days. For 2 weeks post op for pain. What changed:  medication strength how much to take when to take this additional instructions   methocarbamol 750 MG tablet Commonly known as: Robaxin-750 Take 1 tablet (750 mg total) by mouth every 8 (eight) hours as needed for muscle spasms.   omeprazole 20 MG tablet Commonly known as: PriLOSEC OTC Take 1 tablet (20 mg total) by mouth daily. For gastric protection   ondansetron 4 MG disintegrating tablet Commonly known as: Zofran ODT Take 1 tablet (4 mg total) by mouth 2 (two) times daily as needed for nausea or vomiting.   oxyCODONE 5 MG immediate release tablet Commonly known as: Roxicodone Take 1 tablet (5 mg total) by mouth every 8 (eight) hours as needed.   Potassium 99 MG Tabs Take 99 mg by mouth daily.   tamsulosin 0.4 MG Caps capsule Commonly known as: FLOMAX Take 0.4 mg by mouth daily.   THERATEARS OP Place 1 drop into both eyes daily as needed (burning).   vitamin B-12 1000 MCG tablet Commonly known as: CYANOCOBALAMIN Take 1,000 mcg by mouth daily.       ASK your doctor about these medications    FLUoxetine 40 MG capsule Commonly known as: PROZAC Take one tablet each day for depression. (40mg  )   losartan 25 MG tablet Commonly known as: COZAAR One tablet by mouth every other day   potassium chloride SA 20 MEQ tablet Commonly known as: KLOR-CON Take one tablet two times a day for low potassium.   propranolol 20 MG tablet Commonly known as: INDERAL TAKE 1 TABLET BY MOUTH THREE TIMES DAILY   warfarin 5 MG tablet Commonly known as: COUMADIN Take as directed. If you are unsure how to take this medication, talk to your nurse or doctor. Original instructions: Take as directed by the anticoagulation clinic               Discharge Care Instructions  (From admission, onward)            Start     Ordered   11/28/20 0000  Weight bearing as tolerated        11/28/20 1357            Follow-up Information     Renette Butters, MD Follow up in 2 week(s).   Specialty: Orthopedic Surgery Contact information: [address] [place] [address] [phone]                 Signed: Alisa Graff 11/28/2020, 2:08 PM

## 2020-11-28 NOTE — Plan of Care (Signed)
  Problem: Education: Goal: Knowledge of General Education information will improve Description: Including pain rating scale, medication(s)/side effects and non-pharmacologic comfort measures Outcome: Progressing   Problem: Health Behavior/Discharge Planning: Goal: Ability to manage health-related needs will improve Outcome: Progressing   Problem: Clinical Measurements: Goal: Ability to maintain clinical measurements within normal limits will improve Outcome: Progressing Goal: Will remain free from infection Outcome: Progressing Goal: Diagnostic test results will improve Outcome: Progressing Goal: Respiratory complications will improve Outcome: Progressing Goal: Cardiovascular complication will be avoided Outcome: Progressing   Problem: Activity: Goal: Risk for activity intolerance will decrease Outcome: Progressing   Problem: Nutrition: Goal: Adequate nutrition will be maintained Outcome: Progressing   Problem: Coping: Goal: Level of anxiety will decrease Outcome: Progressing   Problem: Elimination: Goal: Will not experience complications related to bowel motility Outcome: Progressing Goal: Will not experience complications related to urinary retention Outcome: Progressing   Problem: Pain Managment: Goal: General experience of comfort will improve Outcome: Progressing   Problem: Safety: Goal: Ability to remain free from injury will improve Outcome: Progressing   Problem: Skin Integrity: Goal: Risk for impaired skin integrity will decrease Outcome: Progressing   Problem: Education: Goal: Knowledge of the prescribed therapeutic regimen will improve Outcome: Progressing   Problem: Activity: Goal: Ability to avoid complications of mobility impairment will improve Outcome: Progressing Goal: Range of joint motion will improve Outcome: Progressing   Problem: Clinical Measurements: Goal: Postoperative complications will be avoided or minimized Outcome:  Progressing   Problem: Pain Management: Goal: Pain level will decrease with appropriate interventions Outcome: Progressing   Problem: Skin Integrity: Goal: Will show signs of wound healing Outcome: Progressing

## 2020-11-28 NOTE — Evaluation (Signed)
Physical Therapy One Time Evaluation Patient Details Name: [name] [name] MRN: [id number] DOB: [birth year] Today's Date: 11/28/2020   History of Present Illness  Pt is an 80 year old male s/p right total knee replacement on 11/27/20.  Past medical history significant for L TKA, right rotator cuff surgery, depression, DJD, tinnitus, paroxysmal afib.  Clinical Impression  Patient evaluated by Physical Therapy with no further acute PT needs identified. All education has been completed and the patient has no further questions. See below for any follow-up Physical Therapy or equipment needs. PT is signing off. Thank you for this referral.  Pt in recliner and eagerly awaiting PT.  Pt assisted with ambulating in hallway and practiced safe stair technique.  Pt also performed a few LE exercises.  Pt reports plan is to return home with spouse and f/u with HHPT.  Pt feels ready for d/c home today and had no further questions.      Follow Up Recommendations Follow surgeon's recommendation for DC plan and follow-up therapies    Equipment Recommendations  None recommended by PT    Recommendations for Other Services       Precautions / Restrictions Precautions Precautions: Fall;Knee Restrictions Weight Bearing Restrictions: No RLE Weight Bearing: Weight bearing as tolerated      Mobility  Bed Mobility Overal bed mobility: Needs Assistance Bed Mobility: Supine to Sit     Supine to sit: Min assist     General bed mobility comments: pt in recliner on arrival    Transfers Overall transfer level: Needs assistance Equipment used: Rolling walker (2 wheeled) Transfers: Sit to/from Stand Sit to Stand: Min guard Stand pivot transfers: Min guard       General transfer comment: verbal cues for UE and LE positioning  Ambulation/Gait Ambulation/Gait assistance: Min guard Gait Distance (Feet): 120 Feet Assistive device: Rolling walker (2 wheeled) Gait Pattern/deviations: Step-to  pattern;Decreased stance time - right;Antalgic Gait velocity: decr   General Gait Details: verbal cues for sequence, RW positioning, step length, posture  Stairs Stairs: Yes Stairs assistance: Min guard Stair Management: Forwards;Step to pattern;One rail Left;With cane Number of Stairs: 3 General stair comments: verbal cues for safety and technique; pt performed twice, required correction with verbal teach back so provided stair handout (pt reports likely from pain meds)  Wheelchair Mobility    Modified Rankin (Stroke Patients Only)       Balance Overall balance assessment: Needs assistance Sitting-balance support: Single extremity supported Sitting balance-Leahy Scale: Normal       Standing balance-Leahy Scale: Fair                               Pertinent Vitals/Pain Pain Assessment: 0-10 Pain Score: 5  Pain Location: R knee Pain Descriptors / Indicators: Aching;Sore Pain Intervention(s): Repositioned;Monitored during session;Ice applied    Home Living Family/patient expects to be discharged to:: Private residence Living Arrangements: Spouse/significant other Available Help at Discharge: Family;Available 24 hours/day Type of Home: House Home Access: Stairs to enter Entrance Stairs-Rails: Left (going up) Entrance Stairs-Number of Steps: 3 Home Layout: Able to live on main level with bedroom/bathroom;Two level Home Equipment: Shower seat;Adaptive equipment;Walker - 2 wheels;Cane - single point;Cane - quad;Wheelchair - manual      Prior Function Level of Independence: Independent               Hand Dominance   Dominant Hand: Right    Extremity/Trunk Assessment   Upper Extremity Assessment  Upper Extremity Assessment: Overall WFL for tasks assessed    Lower Extremity Assessment Lower Extremity Assessment: RLE deficits/detail RLE Deficits / Details: able to perform SLR, approx lacking 10* extension to 35* flexion with AAROM right knee        Communication   Communication: No difficulties  Cognition Arousal/Alertness: Awake/alert Behavior During Therapy: WFL for tasks assessed/performed Overall Cognitive Status: Within Functional Limits for tasks assessed                                        General Comments      Exercises Total Joint Exercises Ankle Circles/Pumps: AROM;Both;10 reps Quad Sets: AROM;10 reps;Right Heel Slides: AROM;Seated;Right;10 reps   Assessment/Plan    PT Assessment All further PT needs can be met in the next venue of care  PT Problem List Decreased strength;Decreased range of motion;Decreased balance;Decreased knowledge of use of DME;Pain;Decreased mobility       PT Treatment Interventions      PT Goals (Current goals can be found in the Care Plan section)  Acute Rehab PT Goals Patient Stated Goal: patient wants to go back home today with wife. PT Goal Formulation: All assessment and education complete, DC therapy    Frequency     Barriers to discharge        Co-evaluation               AM-PAC PT "6 Clicks" Mobility  Outcome Measure Help needed turning from your back to your side while in a flat bed without using bedrails?: A Little Help needed moving from lying on your back to sitting on the side of a flat bed without using bedrails?: A Little Help needed moving to and from a bed to a chair (including a wheelchair)?: A Little Help needed standing up from a chair using your arms (e.g., wheelchair or bedside chair)?: A Little Help needed to walk in hospital room?: A Little Help needed climbing 3-5 steps with a railing? : A Little 6 Click Score: 18    End of Session Equipment Utilized During Treatment: Gait belt Activity Tolerance: Patient tolerated treatment well Patient left: in chair;with call bell/phone within reach;with chair alarm set Nurse Communication: Mobility status PT Visit Diagnosis: Other abnormalities of gait and mobility (R26.89)     Time: 1157-2620 PT Time Calculation (min) (ACUTE ONLY): 25 min   Charges:   PT Evaluation $PT Eval Low Complexity: 1 Low PT Treatments $Gait Training: 8-22 mins       Jannette Spanner PT, DPT Acute Rehabilitation Services Pager: [phone] Office: [place] 11/28/2020, 12:08 PM

## 2020-11-28 NOTE — Progress Notes (Signed)
ANTICOAGULATION CONSULT NOTE - Follow Up Consult  Pharmacy Consult for Warfarin Indication: atrial fibrillation and VTE prophylaxis  Allergies  Allergen Reactions   Iohexol Hives    POST MYELOGRAM.  PT ALSO SAID THIS HAPPENED A YEAR AGO WITH A MYELOGRAM AND WITH ESI'S., Onset Date: [id number]    Meperidine Hcl Other (See Comments)    hallucinations   Demerol Hcl [Meperidine] Other (See Comments)    Hallucinations   Prednisone Other (See Comments)    Redness of skin & jittery    Patient Measurements: Height: 5\' 6"  (167.6 cm) Weight: 75.3 kg (166 lb 0.1 oz) IBW/kg (Calculated) : 63.8 Heparin Dosing Weight:   Vital Signs: Temp: 98.6 F (37 C) (07/06 0912) Temp Source: Oral (07/06 0525) BP: 155/78 (07/06 0912) Pulse Rate: 67 (07/06 0912)  Labs: Recent Labs    11/28/20 0411  LABPROT 14.4  INR 1.1    Estimated Creatinine Clearance: 58.1 mL/min (by C-G formula based on SCr of 0.9 mg/dL).   Medications:  Scheduled:   acetaminophen  1,000 mg Oral Q6H   dexamethasone (DECADRON) injection  10 mg Intravenous Once   docusate sodium  100 mg Oral BID   ferrous gluconate  324 mg Oral Daily   FLUoxetine  40 mg Oral Daily   gabapentin  200 mg Oral TID   losartan  25 mg Oral QODAY   pantoprazole  40 mg Oral Daily   propranolol  20 mg Oral TID   tamsulosin  0.4 mg Oral Daily   traMADol  50 mg Oral Q6H   Warfarin - Pharmacist Dosing Inpatient   Does not apply q1600   Infusions:   0.9 % NaCl with KCl 20 mEq / L 100 mL/hr at 11/28/20 0552   methocarbamol (ROBAXIN) IV 500 mg (11/27/20 1344)    Assessment: 80 yo male on chronic warfarin for hx afib - last dose taken 6/29.  He is s/p R TKA today and Pharmacy is consulted to resume warfarin dosing post-op.  Per review of outpatient clinical notes, home dose is 2.5mg  on Mon, Tues, Wed, Fri, and Sat; 5mg  on Thurs and Sun. INR almost therapeutic (1.9) on 11/19/20.  Today, 11/28/2020: INR 1.1, subtherapeutic as expected after only one  dose. CBC: pending No bleeding or complications reported.     Goal of Therapy:  INR 2-3 Monitor platelets by anticoagulation protocol: Yes   Plan:  Warfarin 7.5 mg PO x 1. Daily PT/INR. Monitor for signs and symptoms of bleeding.   Gretta Arab PharmD, BCPS Clinical Pharmacist [facility] main pharmacy [phone] 11/28/2020 10:00 AM

## 2020-11-28 NOTE — Discharge Instructions (Signed)

## 2020-11-29 LAB — CUP PACEART REMOTE DEVICE CHECK
Battery Impedance: 1238 Ohm
Battery Remaining Longevity: 55 mo
Battery Voltage: 2.78 V
Brady Statistic AP VP Percent: 0 %
Brady Statistic AP VS Percent: 97 %
Brady Statistic AS VP Percent: 0 %
Brady Statistic AS VS Percent: 3 %
Date Time Interrogation Session: 20220701164347
Implantable Lead Implant Date: 20070112
Implantable Lead Implant Date: 20070112
Implantable Lead Location: 753859
Implantable Lead Location: 753860
Implantable Lead Model: 5076
Implantable Lead Model: 5076
Implantable Pulse Generator Implant Date: 20131018
Lead Channel Impedance Value: 479 Ohm
Lead Channel Impedance Value: 527 Ohm
Lead Channel Pacing Threshold Amplitude: 0.5 V
Lead Channel Pacing Threshold Amplitude: 0.875 V
Lead Channel Pacing Threshold Pulse Width: 0.4 ms
Lead Channel Pacing Threshold Pulse Width: 0.4 ms
Lead Channel Setting Pacing Amplitude: 2 V
Lead Channel Setting Pacing Amplitude: 2.5 V
Lead Channel Setting Pacing Pulse Width: 0.34 ms
Lead Channel Setting Sensing Sensitivity: 2.8 mV

## 2020-11-30 ENCOUNTER — Inpatient Hospital Stay (HOSPITAL_COMMUNITY)
Admission: EM | Admit: 2020-11-30 | Discharge: 2020-12-04 | DRG: 982 | Disposition: A | Discharge status: Home Health | Payer: Medicare Other | Attending: Student in an Organized Health Care Education/Training Program | Admitting: Student in an Organized Health Care Education/Training Program

## 2020-11-30 ENCOUNTER — Emergency Department (HOSPITAL_COMMUNITY): Payer: Medicare Other

## 2020-11-30 ENCOUNTER — Other Ambulatory Visit: Payer: Self-pay

## 2020-11-30 ENCOUNTER — Encounter (HOSPITAL_COMMUNITY): Payer: Self-pay

## 2020-11-30 DIAGNOSIS — I10 Essential (primary) hypertension: Secondary | ICD-10-CM | POA: Diagnosis not present

## 2020-11-30 DIAGNOSIS — Z20822 Contact with and (suspected) exposure to covid-19: Secondary | ICD-10-CM | POA: Diagnosis not present

## 2020-11-30 DIAGNOSIS — D6489 Other specified anemias: Secondary | ICD-10-CM | POA: Diagnosis present

## 2020-11-30 DIAGNOSIS — I4819 Other persistent atrial fibrillation: Principal | ICD-10-CM | POA: Diagnosis present

## 2020-11-30 DIAGNOSIS — F13239 Sedative, hypnotic or anxiolytic dependence with withdrawal, unspecified: Secondary | ICD-10-CM | POA: Diagnosis present

## 2020-11-30 DIAGNOSIS — Z96651 Presence of right artificial knee joint: Secondary | ICD-10-CM

## 2020-11-30 DIAGNOSIS — I495 Sick sinus syndrome: Secondary | ICD-10-CM | POA: Diagnosis not present

## 2020-11-30 DIAGNOSIS — I4891 Unspecified atrial fibrillation: Secondary | ICD-10-CM | POA: Diagnosis present

## 2020-11-30 DIAGNOSIS — Z7901 Long term (current) use of anticoagulants: Secondary | ICD-10-CM | POA: Diagnosis not present

## 2020-11-30 DIAGNOSIS — E785 Hyperlipidemia, unspecified: Secondary | ICD-10-CM | POA: Diagnosis not present

## 2020-11-30 DIAGNOSIS — F419 Anxiety disorder, unspecified: Secondary | ICD-10-CM | POA: Diagnosis not present

## 2020-11-30 DIAGNOSIS — Z79899 Other long term (current) drug therapy: Secondary | ICD-10-CM

## 2020-11-30 DIAGNOSIS — E871 Hypo-osmolality and hyponatremia: Secondary | ICD-10-CM | POA: Diagnosis not present

## 2020-11-30 DIAGNOSIS — Z950 Presence of cardiac pacemaker: Secondary | ICD-10-CM | POA: Diagnosis not present

## 2020-11-30 DIAGNOSIS — E861 Hypovolemia: Secondary | ICD-10-CM | POA: Diagnosis present

## 2020-11-30 DIAGNOSIS — K5900 Constipation, unspecified: Secondary | ICD-10-CM | POA: Diagnosis not present

## 2020-11-30 DIAGNOSIS — M1711 Unilateral primary osteoarthritis, right knee: Secondary | ICD-10-CM | POA: Diagnosis present

## 2020-11-30 DIAGNOSIS — M7989 Other specified soft tissue disorders: Secondary | ICD-10-CM | POA: Diagnosis not present

## 2020-11-30 DIAGNOSIS — M79606 Pain in leg, unspecified: Secondary | ICD-10-CM | POA: Diagnosis not present

## 2020-11-30 LAB — BRAIN NATRIURETIC PEPTIDE: B Natriuretic Peptide: 205.6 pg/mL — ABNORMAL HIGH (ref 0.0–100.0)

## 2020-11-30 LAB — D-DIMER, QUANTITATIVE: D-Dimer, Quant: 1.65 ug/mL-FEU — ABNORMAL HIGH (ref 0.00–0.50)

## 2020-11-30 LAB — CBC WITH DIFFERENTIAL/PLATELET
Abs Immature Granulocytes: 0.11 10*3/uL — ABNORMAL HIGH (ref 0.00–0.07)
Basophils Absolute: 0 10*3/uL (ref 0.0–0.1)
Basophils Relative: 0 %
Eosinophils Absolute: 0.1 10*3/uL (ref 0.0–0.5)
Eosinophils Relative: 1 %
HCT: 32.9 % — ABNORMAL LOW (ref 39.0–52.0)
Hemoglobin: 11.5 g/dL — ABNORMAL LOW (ref 13.0–17.0)
Immature Granulocytes: 1 %
Lymphocytes Relative: 6 %
Lymphs Abs: 0.6 10*3/uL — ABNORMAL LOW (ref 0.7–4.0)
MCH: 32.3 pg (ref 26.0–34.0)
MCHC: 35 g/dL (ref 30.0–36.0)
MCV: 92.4 fL (ref 80.0–100.0)
Monocytes Absolute: 0.7 10*3/uL (ref 0.1–1.0)
Monocytes Relative: 7 %
Neutro Abs: 8.3 10*3/uL — ABNORMAL HIGH (ref 1.7–7.7)
Neutrophils Relative %: 85 %
Platelets: 184 10*3/uL (ref 150–400)
RBC: 3.56 MIL/uL — ABNORMAL LOW (ref 4.22–5.81)
RDW: 14.4 % (ref 11.5–15.5)
WBC: 9.8 10*3/uL (ref 4.0–10.5)
nRBC: 0 % (ref 0.0–0.2)

## 2020-11-30 LAB — I-STAT CHEM 8, ED
BUN: 16 mg/dL (ref 8–23)
Calcium, Ion: 1.04 mmol/L — ABNORMAL LOW (ref 1.15–1.40)
Chloride: 91 mmol/L — ABNORMAL LOW (ref 98–111)
Creatinine, Ser: 0.7 mg/dL (ref 0.61–1.24)
Glucose, Bld: 103 mg/dL — ABNORMAL HIGH (ref 70–99)
HCT: 30 % — ABNORMAL LOW (ref 39.0–52.0)
Hemoglobin: 10.2 g/dL — ABNORMAL LOW (ref 13.0–17.0)
Potassium: 4.1 mmol/L (ref 3.5–5.1)
Sodium: 125 mmol/L — ABNORMAL LOW (ref 135–145)
TCO2: 27 mmol/L (ref 22–32)

## 2020-11-30 LAB — COMPREHENSIVE METABOLIC PANEL
ALT: 17 U/L (ref 0–44)
AST: 31 U/L (ref 15–41)
Albumin: 3 g/dL — ABNORMAL LOW (ref 3.5–5.0)
Alkaline Phosphatase: 50 U/L (ref 38–126)
Anion gap: 8 (ref 5–15)
BUN: 13 mg/dL (ref 8–23)
CO2: 26 mmol/L (ref 22–32)
Calcium: 8.5 mg/dL — ABNORMAL LOW (ref 8.9–10.3)
Chloride: 91 mmol/L — ABNORMAL LOW (ref 98–111)
Creatinine, Ser: 0.78 mg/dL (ref 0.61–1.24)
GFR, Estimated: >60 mL/min (ref >60)
Glucose, Bld: 107 mg/dL — ABNORMAL HIGH (ref 70–99)
Potassium: 3.8 mmol/L (ref 3.5–5.1)
Sodium: 125 mmol/L — ABNORMAL LOW (ref 135–145)
Total Bilirubin: 1.3 mg/dL — ABNORMAL HIGH (ref 0.3–1.2)
Total Protein: 5.8 g/dL — ABNORMAL LOW (ref 6.5–8.1)

## 2020-11-30 LAB — SODIUM, URINE, RANDOM: Sodium, Ur: <10 mmol/L

## 2020-11-30 LAB — OSMOLALITY: Osmolality: 275 mOsm/kg (ref 275–295)

## 2020-11-30 LAB — LACTIC ACID, PLASMA
Lactic Acid, Venous: 1.6 mmol/L (ref 0.5–1.9)
Lactic Acid, Venous: 1 mmol/L (ref 0.5–1.9)

## 2020-11-30 LAB — RESP PANEL BY RT-PCR (FLU A&B, COVID) ARPGX2
Influenza A by PCR: NEGATIVE
Influenza B by PCR: NEGATIVE
SARS Coronavirus 2 by RT PCR: NEGATIVE

## 2020-11-30 LAB — TROPONIN I (HIGH SENSITIVITY)
Troponin I (High Sensitivity): 16 ng/L (ref <18)
Troponin I (High Sensitivity): 13 ng/L (ref <18)

## 2020-11-30 LAB — PROTIME-INR
INR: 2.6 — ABNORMAL HIGH (ref 0.8–1.2)
Prothrombin Time: 28.1 seconds — ABNORMAL HIGH (ref 11.4–15.2)

## 2020-11-30 LAB — OSMOLALITY, URINE: Osmolality, Ur: 91 mOsm/kg — ABNORMAL LOW (ref 300–900)

## 2020-11-30 MED ORDER — ACETAMINOPHEN 500 MG PO TABS
1000.0000 mg | ORAL_TABLET | Freq: Four times a day (QID) | ORAL | Status: DC | PRN
  Administered 2020-11-30: 1000 mg via ORAL
  Filled 2020-11-30: qty 2

## 2020-11-30 MED ORDER — DILTIAZEM HCL 25 MG/5ML IV SOLN
10.0000 mg | Freq: Once | INTRAVENOUS | Status: AC
  Administered 2020-11-30: 10 mg via INTRAVENOUS
  Filled 2020-11-30: qty 5

## 2020-11-30 MED ORDER — POLYETHYLENE GLYCOL 3350 17 G PO PACK
17.0000 g | PACK | Freq: Two times a day (BID) | ORAL | Status: DC
  Administered 2020-11-30 – 2020-12-04 (×4): 17 g via ORAL
  Filled 2020-11-30 (×7): qty 1

## 2020-11-30 MED ORDER — SODIUM CHLORIDE 0.9 % IV BOLUS
500.0000 mL | Freq: Once | INTRAVENOUS | Status: AC
  Administered 2020-11-30: 500 mL via INTRAVENOUS

## 2020-11-30 MED ORDER — ORAL CARE MOUTH RINSE
15.0000 mL | Freq: Two times a day (BID) | OROMUCOSAL | Status: DC
  Administered 2020-12-01 – 2020-12-04 (×7): 15 mL via OROMUCOSAL

## 2020-11-30 MED ORDER — FLUOXETINE HCL 20 MG PO CAPS
40.0000 mg | ORAL_CAPSULE | Freq: Every day | ORAL | Status: DC
  Administered 2020-12-01 – 2020-12-04 (×4): 40 mg via ORAL
  Filled 2020-11-30 (×4): qty 2

## 2020-11-30 MED ORDER — OXYCODONE-ACETAMINOPHEN 5-325 MG PO TABS
1.0000 | ORAL_TABLET | Freq: Once | ORAL | Status: AC
  Administered 2020-11-30: 1 via ORAL
  Filled 2020-11-30: qty 1

## 2020-11-30 MED ORDER — HYDROCORTISONE NA SUCCINATE PF 250 MG IJ SOLR
200.0000 mg | Freq: Once | INTRAMUSCULAR | Status: AC
  Administered 2020-11-30: 200 mg via INTRAVENOUS
  Filled 2020-11-30: qty 200

## 2020-11-30 MED ORDER — DIPHENHYDRAMINE HCL 50 MG/ML IJ SOLN
50.0000 mg | Freq: Once | INTRAMUSCULAR | Status: AC
  Filled 2020-11-30: qty 1

## 2020-11-30 MED ORDER — ONDANSETRON HCL 4 MG/5ML PO SOLN
4.0000 mg | Freq: Three times a day (TID) | ORAL | Status: DC | PRN
  Filled 2020-11-30: qty 5

## 2020-11-30 MED ORDER — HEPARIN SODIUM (PORCINE) 5000 UNIT/ML IJ SOLN: 5000.0000 [IU] | Freq: Three times a day (TID) | INTRAMUSCULAR | Status: DC

## 2020-11-30 MED ORDER — ENSURE ENLIVE PO LIQD
237.0000 mL | Freq: Two times a day (BID) | ORAL | Status: DC
  Administered 2020-12-02 – 2020-12-03 (×3): 237 mL via ORAL

## 2020-11-30 MED ORDER — IOHEXOL 350 MG/ML SOLN
75.0000 mL | Freq: Once | INTRAVENOUS | Status: AC | PRN
  Administered 2020-11-30: 75 mL via INTRAVENOUS

## 2020-11-30 MED ORDER — DILTIAZEM HCL-DEXTROSE 125-5 MG/125ML-% IV SOLN (PREMIX)
5.0000 mg/h | INTRAVENOUS | Status: DC
  Administered 2020-11-30: 5 mg/h via INTRAVENOUS
  Administered 2020-12-01: 9.5 mg/h via INTRAVENOUS
  Administered 2020-12-01: 10 mg/h via INTRAVENOUS
  Administered 2020-12-02: 12.5 mg/h via INTRAVENOUS
  Administered 2020-12-03 – 2020-12-04 (×2): 5 mg/h via INTRAVENOUS
  Filled 2020-11-30 (×6): qty 125

## 2020-11-30 MED ORDER — DIPHENHYDRAMINE HCL 25 MG PO CAPS
50.0000 mg | ORAL_CAPSULE | Freq: Once | ORAL | Status: AC
  Administered 2020-11-30: 50 mg via ORAL
  Filled 2020-11-30: qty 2

## 2020-11-30 NOTE — ED Provider Notes (Signed)
[facility] EMERGENCY DEPARTMENT Provider Note   CSN: [id number] Arrival date & time: 11/30/20  1249     History Chief Complaint  Patient presents with   Weakness    Weak and tired, A Fib    [name] [name] is a 80 y.o. male past medical history of paroxysmal A. fib (on Coumadin), hypertension, hyperlipidemia with recent total knee replacement done on 11/27/2020 brought in by EMS for evaluation of generalized weakness, fatigue, tachycardia.  Patient reports that he had a recent total knee replacement done on 11/27/2020.  He was discharged home and noted that he has been tired, fatigued since then.  He was getting PT done today and they noted that his heart rate was high between 120-140, prompting EMS call.  Patient does report that he had a 5-day hold of his Coumadin.  He resumed it on 11/28/2020 and states he has taken it since then.  His last dose was earlier today.  He has not had any chest pain, difficulty breathing.  He has had some pain to his right knee but no drainage, erythema.  He denies any nausea, vomiting, abdominal pain.  The history is provided by the patient.      Past Medical History:  Diagnosis Date   Anemia    since post op- knee replacement    Anxiety    takes xanax 2 times per day, taken mostly for ringing in ears     Arthritis of knee, right    end-stage   Bronchitis    Bursitis    Complication of anesthesia    irritation in throat postop, makes a-fib worse    Depression    DJD (degenerative joint disease)    OA- "everywhere"   Dysrhythmia    Family history of anesthesia complication    sister with nausia and vomiting   Gout    Heart murmur    HTN (hypertension)    Hyperlipidemia    Migraines    Paroxysmal atrial fibrillation (HCC)    PONV (postoperative nausea and vomiting)    Presence of permanent cardiac pacemaker    Tinnitus     Patient Active Problem List   Diagnosis Date Noted   S/P total knee arthroplasty, right 11/27/2020    Sick sinus syndrome ([place]) 08/03/2019   Major depressive disorder, recurrent episode, severe ([place]) 03/26/2013   Suicidal ideation 03/26/2013   Pain due to total left knee replacement ([place]) 08/09/2011   Long term current use of anticoagulant 07/15/2010   PPM-Medtronic 06/05/2009   HEADACHE 04/11/2009   HYPERTENSION, BENIGN 04/19/2008   ATRIAL FIBRILLATION 04/19/2008    Past Surgical History:  Procedure Laterality Date   APPENDECTOMY     Atrial fibrillation ablation  2009, 12/02/11   PVI by Dr Rayann Heman x 2   ATRIAL FIBRILLATION ABLATION N/A 12/02/2011   Procedure: ATRIAL FIBRILLATION ABLATION;  Surgeon: Thompson Grayer, MD;  Location: [facility] CATH LAB;  Service: Cardiovascular;  Laterality: N/A;   bilteral thumb joint surgrery     CARDIAC CATHETERIZATION  2005    which revealed 40% stenosis of the mid left anterior descending artery., ablation- 2009   CHOLECYSTECTOMY     JOINT REPLACEMENT     L knee- 12/11,R hip- 1996   left rotator cuff repair     PACEMAKER INSERTION  06/06/2005   Medtronic EnRhythm dual-chamber. For sick sinus syndrome   PERMANENT PACEMAKER GENERATOR CHANGE N/A 03/12/2012   Procedure: PERMANENT PACEMAKER GENERATOR CHANGE;  Surgeon: Evans Lance, MD;  Location: [facility] CATH LAB;  Service: Cardiovascular;  Laterality: N/A;   RHINOPLASTY     right rotator cuff surgery  2006   right total hip arthroplasty     TEE WITHOUT CARDIOVERSION  12/01/2011   Procedure: TRANSESOPHAGEAL ECHOCARDIOGRAM (TEE);  Surgeon: Fay Records, MD;  Location: [facility];  Service: Cardiovascular;  Laterality: N/A;  a-fib ablation following day   TONSILLECTOMY     as a child   TOTAL KNEE ARTHROPLASTY Right 11/27/2020   Procedure: TOTAL KNEE ARTHROPLASTY;  Surgeon: Renette Butters, MD;  Location: [facility];  Service: Orthopedics;  Laterality: Right;   TOTAL KNEE REVISION  08/08/2011   Procedure: TOTAL KNEE REVISION;  Surgeon: Kerin Salen, MD;  Location: [facility];  Service: Orthopedics;  Laterality: Left;   DEPUY/SIGMA   TRANSESOPHAGEAL ECHOCARDIOGRAM  04/2008   TRANSTHORACIC ECHOCARDIOGRAM  2006,2009   uvulectomy         Family History  Problem Relation Age of Onset   Coronary artery disease Mother    Cerebrovascular Disease Mother    Cancer Other    Anesthesia problems Neg Hx    Hypotension Neg Hx    Malignant hyperthermia Neg Hx    Pseudochol deficiency Neg Hx     Social History   Tobacco Use   Smoking status: Never   Smokeless tobacco: Never  Vaping Use   Vaping Use: Never used  Substance Use Topics   Alcohol use: Not Currently    Comment: rare use   Drug use: No    Home Medications Prior to Admission medications   Medication Sig Start Date End Date Taking? Authorizing Provider  acetaminophen (TYLENOL) 500 MG tablet Take 2 tablets (1,000 mg total) by mouth every 6 (six) hours as needed for mild pain or moderate pain. 11/28/20  Yes Gawne, Meghan M, PA-C  ALPRAZolam Duanne Moron) 0.5 MG tablet Take 1 tablet (0.5 mg total) by mouth 3 (three) times daily as needed for anxiety. Patient taking differently: Take 0.5 mg by mouth 3 (three) times daily. 03/28/13  Yes Mashburn, Marlane Hatcher, PA-C  Carboxymethylcellulose Sodium (THERATEARS OP) Place 1 drop into both eyes at bedtime.   Yes [provider]  docusate sodium (COLACE) 100 MG capsule Take 100 mg by mouth 2 (two) times daily.   Yes [provider]  FLUoxetine (PROZAC) 40 MG capsule Take one tablet each day for depression. (40mg  ) Patient taking differently: Take 40 mg by mouth daily. 03/28/13  Yes Mashburn, Milta Deiters T, PA-C  gabapentin (NEURONTIN) 300 MG capsule Take 1 capsule (300 mg total) by mouth 2 (two) times daily for 14 days. For 2 weeks post op for pain. 11/28/20 12/12/20 Yes Gawne, Meghan M, PA-C  losartan (COZAAR) 25 MG tablet One tablet by mouth every other day Patient taking differently: Take 25 mg by mouth every other day. 06/18/20  Yes Evans Lance, MD  methocarbamol (ROBAXIN-750) 750 MG tablet Take 1 tablet  (750 mg total) by mouth every 8 (eight) hours as needed for muscle spasms. 11/28/20  Yes Gawne, Meghan M, PA-C  ondansetron (ZOFRAN ODT) 4 MG disintegrating tablet Take 1 tablet (4 mg total) by mouth 2 (two) times daily as needed for nausea or vomiting. 11/28/20  Yes Gawne, Meghan M, PA-C  oxyCODONE (ROXICODONE) 5 MG immediate release tablet Take 1 tablet (5 mg total) by mouth every 8 (eight) hours as needed. Patient taking differently: Take 5 mg by mouth every 8 (eight) hours as needed (pain). 11/28/20 11/28/21 Yes Britt Bottom,  PA-C  propranolol (INDERAL) 20 MG tablet TAKE 1 TABLET BY MOUTH THREE TIMES DAILY Patient taking differently: Take 20 mg by mouth 3 (three) times daily. 09/17/20  Yes Evans Lance, MD  tamsulosin (FLOMAX) 0.4 MG CAPS capsule Take 0.4 mg by mouth daily. 08/10/20  Yes [provider]  vitamin B-12 (CYANOCOBALAMIN) 1000 MCG tablet Take 1,000 mcg by mouth daily.   Yes [provider]  warfarin (COUMADIN) 5 MG tablet Take as directed by the anticoagulation clinic Patient taking differently: Take 2.5-5 mg by mouth See admin instructions. Take one tablet (5 mg) by mouth on Thursday and Sunday mornings, take 1/2 tablet (2.5 mg) on all other mornings of the week. 10/29/20  Yes Evans Lance, MD  Cholecalciferol (DIALYVITE VITAMIN D 5000) 125 MCG (5000 UT) capsule Take 5,000 Units by mouth 3 (three) times a week.    [provider]  ferrous gluconate (FERGON) 240 (27 FE) MG tablet Take 240 mg by mouth daily.    [provider]  omeprazole (PRILOSEC OTC) 20 MG tablet Take 1 tablet (20 mg total) by mouth daily. For gastric protection 11/28/20 12/28/20  Britt Bottom, PA-C  Potassium 99 MG TABS Take 99 mg by mouth daily.    [provider]  potassium chloride SA (K-DUR,KLOR-CON) 20 MEQ tablet Take one tablet two times a day for low potassium. Patient not taking: No sig reported 03/28/13   Nena Polio T, PA-C    Allergies    Iohexol, Meperidine  hcl, Demerol hcl [meperidine], and Prednisone  Review of Systems   Review of Systems  Constitutional:  Positive for fatigue. Negative for fever.  Respiratory:  Negative for cough and shortness of breath.   Cardiovascular:  Positive for palpitations. Negative for chest pain.  Gastrointestinal:  Negative for abdominal pain, nausea and vomiting.  Genitourinary:  Negative for dysuria and hematuria.  Neurological:  Positive for weakness (generalized). Negative for headaches.  All other systems reviewed and are negative.  Physical Exam Updated Vital Signs BP 109/79   Pulse (!) 135   Temp 98.3 F (36.8 C) (Oral)   Resp (!) 7   Ht 5\' 6"  (1.676 m)   Wt 75.3 kg   SpO2 99%   BMI 26.79 kg/m   Physical Exam Vitals and nursing note reviewed.  Constitutional:      Appearance: Normal appearance. He is well-developed.  HENT:     Head: Normocephalic and atraumatic.  Eyes:     General: Lids are normal.     Conjunctiva/sclera: Conjunctivae normal.     Pupils: Pupils are equal, round, and reactive to light.  Cardiovascular:     Rate and Rhythm: Tachycardia present. Rhythm irregularly irregular.     Pulses: Normal pulses.     Heart sounds: Normal heart sounds. No murmur heard.   No friction rub. No gallop.  Pulmonary:     Effort: Pulmonary effort is normal.     Breath sounds: Normal breath sounds.     Comments: Lungs clear to auscultation bilaterally.  Symmetric chest rise.  No wheezing, rales, rhonchi. Abdominal:     Palpations: Abdomen is soft. Abdomen is not rigid.     Tenderness: There is no abdominal tenderness. There is no guarding.  Musculoskeletal:        General: Normal range of motion.     Cervical back: Full passive range of motion without pain.     Comments: Right knee with surgical incision noted to the anterior aspect.  Stitches in  place.  No surrounding warmth, erythema.  No active drainage.  It is not warm to touch.  Skin:    General: Skin is warm and dry.     Capillary  Refill: Capillary refill takes less than 2 seconds.  Neurological:     Mental Status: He is alert and oriented to person, place, and time.  Psychiatric:        Speech: Speech normal.    ED Results / Procedures / Treatments   Labs (all labs ordered are listed, but only abnormal results are displayed) Labs Reviewed  COMPREHENSIVE METABOLIC PANEL - Abnormal; Notable for the following components:      Result Value   Sodium 125 (*)    Chloride 91 (*)    Glucose, Bld 107 (*)    Calcium 8.5 (*)    Total Protein 5.8 (*)    Albumin 3.0 (*)    Total Bilirubin 1.3 (*)    All other components within normal limits  CBC WITH DIFFERENTIAL/PLATELET - Abnormal; Notable for the following components:   RBC 3.56 (*)    Hemoglobin 11.5 (*)    HCT 32.9 (*)    Neutro Abs 8.3 (*)    Lymphs Abs 0.6 (*)    Abs Immature Granulocytes 0.11 (*)    All other components within normal limits  PROTIME-INR - Abnormal; Notable for the following components:   Prothrombin Time 28.1 (*)    INR 2.6 (*)    All other components within normal limits  BRAIN NATRIURETIC PEPTIDE - Abnormal; Notable for the following components:   B Natriuretic Peptide 205.6 (*)    All other components within normal limits  I-STAT CHEM 8, ED - Abnormal; Notable for the following components:   Sodium 125 (*)    Chloride 91 (*)    Glucose, Bld 103 (*)    Calcium, Ion 1.04 (*)    Hemoglobin 10.2 (*)    HCT 30.0 (*)    All other components within normal limits  RESP PANEL BY RT-PCR (FLU A&B, COVID) ARPGX2  LACTIC ACID, PLASMA  LACTIC ACID, PLASMA  D-DIMER, QUANTITATIVE  TROPONIN I (HIGH SENSITIVITY)  TROPONIN I (HIGH SENSITIVITY)    EKG EKG Interpretation  Date/Time:  Friday November 30 2020 14:34:18 EDT Ventricular Rate:  80 PR Interval:    QRS Duration: 96 QT Interval:  371 QTC Calculation: 428 R Axis:   33 Text Interpretation: Atrial fibrillation Borderline T wave abnormalities Confirmed by Dene Gentry [phone]) on  11/30/2020 2:35:59 PM  Radiology DG Chest Portable 1 View  Result Date: 11/30/2020 CLINICAL DATA:  Palpitations and weakness EXAM: PORTABLE CHEST 1 VIEW COMPARISON:  04/29/2018 FINDINGS: Pacer with leads at right atrium and right ventricle. No lead discontinuity. Midline trachea. Reverse apical lordotic positioning. Normal heart size for level of inspiration. Atherosclerosis in the transverse aorta. Moderate right hemidiaphragm elevation No pleural effusion or pneumothorax. Low lung volumes, accentuating pulmonary interstitium. Right midlung and bibasilar scarring. No congestive failure. No lobar consolidation. Right upper quadrant surgical clips. IMPRESSION: No acute cardiopulmonary disease. Similar appearance of cardiomegaly with bibasilar scarring and right hemidiaphragm elevation. Aortic Atherosclerosis (ICD10-I70.0). Electronically Signed   By: Abigail Miyamoto M.D.   On: 11/30/2020 14:08    Procedures .Critical Care  Date/Time: 11/30/2020 5:44 PM Performed by: Volanda Napoleon, PA-C Authorized by: Volanda Napoleon, PA-C   Critical care provider statement:    Critical care time (minutes):  45   Critical care was necessary to treat or prevent imminent or life-threatening  deterioration of the following conditions:  Metabolic crisis (Afib with RVR)   Critical care was time spent personally by me on the following activities:  Discussions with consultants, evaluation of patient's response to treatment, examination of patient, ordering and performing treatments and interventions, ordering and review of laboratory studies, ordering and review of radiographic studies, pulse oximetry, re-evaluation of patient's condition, obtaining history from patient or surrogate and review of old charts   I assumed direction of critical care for this patient from another provider in my specialty: yes     Care discussed with: admitting provider     Medications Ordered in ED Medications  diltiazem (CARDIZEM) 125 mg in  dextrose 5% 125 mL (1 mg/mL) infusion (5 mg/hr Intravenous New Bag/Given 11/30/20 1619)  hydrocortisone sodium succinate (SOLU-CORTEF) injection 200 mg (has no administration in time range)  diphenhydrAMINE (BENADRYL) capsule 50 mg (has no administration in time range)    Or  diphenhydrAMINE (BENADRYL) injection 50 mg (has no administration in time range)  diltiazem (CARDIZEM) injection 10 mg (10 mg Intravenous Given 11/30/20 1424)  sodium chloride 0.9 % bolus 500 mL (0 mLs Intravenous Stopped 11/30/20 1500)  oxyCODONE-acetaminophen (PERCOCET/ROXICET) 5-325 MG per tablet 1 tablet (1 tablet Oral Given 11/30/20 1615)  iohexol (OMNIPAQUE) 350 MG/ML injection 75 mL (75 mLs Intravenous Contrast Given 11/30/20 1717)    ED Course  I have reviewed the triage vital signs and the nursing notes.  Pertinent labs & imaging results that were available during my care of the patient were reviewed by me and considered in my medical decision making (see chart for details).    MDM Rules/Calculators/A&P                          80 year old male with past medical history of paroxysmal A. fib (on Coumadin), recent total right knee replacement who presents for evaluation of palpitations.  Noted today at PT to have a heart rate between 120-140.  Was brought in for further evaluation.  He does report he had a 5-day hold on his Coumadin and he resumed it on 11/28/2020.  He does state that he has not really moved around much and has been sitting still for quite long periods of time.  No chest pain, difficulty breathing.  On initially arrival, he is afebrile but is tachycardic, tachypneic.  O2 sats are fluctuating between 89-91% on room air.  On exam, he is in A. fib with heart rate between 120s-140s.  Right knee with surgical incision site noted with stitches in place.  No surrounding warmth, erythema that would be concerning for infectious process.  Given his recent surgery, Coumadin hold as well as tachycardia, A. fib, concern  clinically for PE.  We will plan to check labs, imaging.  CMP shows sodium 125, BUN and creatinine within normal limits.  Lance Bosch is normal.  BNP is 205.6.  INR is 2.6.  Lactic is normal.  CBC shows no leukocytosis.  Hemoglobin stable at 9.5.  Chest x-ray negative for any acute infectious etiology.  There is appearance of cardiomegaly.  Patient was given adult bolus of 10 mg.  Initially had some success and went down to a heart rate of 80.  Clinically, given concerns of A. fib with RVR and tachycardia, still clinical concern for PE.  Patient has history of allergy to iodine contrast as well as prednisone. His INR is therapeutic at this time.   Reevaluation.  Patient tachycardic again with rate between 120-140.  We will start him on diltiazem drip.  At this time, patient is hypoxic, requiring diltiazem drip for rate control.  We will plan for admission.  I did discuss with both patient and wife regarding concerns for PE.  He has tolerated some low doses of prednisone in the past.  At this time, his INR is therapeutic.  We will hold on on heparinization.  He will require admission for hypoxia, A. fib with RVR.  Will discuss plan with admitting team.  I discussed with internal medicine.  They will plan to accept patient for admission.  They would like to proceed with CTA with premedication.  Portions of this note were generated with Lobbyist. Dictation errors may occur despite best attempts at proofreading.   Final Clinical Impression(s) / ED Diagnoses Final diagnoses:  Atrial fibrillation with rapid ventricular response ([place])  Hyponatremia    Rx / DC Orders ED Discharge Orders     None        Desma Mcgregor 11/30/20 1745    Valarie Merino, MD 12/02/20 2056

## 2020-11-30 NOTE — ED Triage Notes (Signed)
Pt to ED from home for evaluation of  general weakness and tiredness. Patient had R knee surgery this past week and was off Coumadin until yesterday. Patient arrives in A Fib with rate of 100-140.

## 2020-11-30 NOTE — H&P (Signed)
Date: 11/30/2020               Patient Name:  [name] [name] MRN: [id number]  DOB: Dec 24, 1940 Age / Sex: 80 y.o., male   PCP: Sherald Hess., MD         Medical Service: Internal Medicine Teaching Service         Attending Physician: Dr. Evette Doffing, Mallie Mussel, *    First Contact: Dr. Scarlett Presto Pager: [phone]  Second Contact: Dr. Maudie Mercury Pager: [phone]       After Hours (After 5p/  First Contact Pager: [phone]  weekends / holidays): Second Contact Pager: [phone]   Chief Complaint: Weakness   History of Present Illness: Mr [name] [name] is a 80 year old man who is very active and independent at baseline. Had a knee replacement four days ago for which his home warfarin was held and restarted the next day. He says that other than the warfarin and his heart stuff he is a pretty healthy guy at baseline but just hasn't been feeling himself. He has had his other knee replaced so he is familiar with the recovery but his wife said that he seems to be doing worse after this surgery than the last one. He was able to do stairs before he was discharged from surgery but when he went home he felt increasingly weak. He was unable to even stand well on his good leg and had to lean up against the wall for support. He had trouble getting in and out of the car. He endorses spending most of the time since surgery in bed not moving much because of the pain and weakness. He was seen by physical therapy today and was told that his heart rate was elevated in a funny rhythm and told that he needed to see a doctor. Denies any shortness of breath, chest pain, pain with breathing, diarrhea, fever, or chills. Endorses constipation since surgery as well as nausea and a headache. His leg is very painful and seems more swollen to him. At baseline he said that his cardiologist told him he is only in afib about 3% of the time.  Meds:  Current Meds  Medication Sig   acetaminophen (TYLENOL) 500 MG tablet Take  2 tablets (1,000 mg total) by mouth every 6 (six) hours as needed for mild pain or moderate pain.   ALPRAZolam (XANAX) 0.5 MG tablet Take 1 tablet (0.5 mg total) by mouth 3 (three) times daily as needed for anxiety. (Patient taking differently: Take 0.5 mg by mouth 3 (three) times daily.)   Carboxymethylcellulose Sodium (THERATEARS OP) Place 1 drop into both eyes at bedtime.   docusate sodium (COLACE) 100 MG capsule Take 100 mg by mouth 2 (two) times daily.   FLUoxetine (PROZAC) 40 MG capsule Take one tablet each day for depression. (40mg  ) (Patient taking differently: Take 40 mg by mouth daily.)   gabapentin (NEURONTIN) 300 MG capsule Take 1 capsule (300 mg total) by mouth 2 (two) times daily for 14 days. For 2 weeks post op for pain.   losartan (COZAAR) 25 MG tablet One tablet by mouth every other day (Patient taking differently: Take 25 mg by mouth every other day.)   methocarbamol (ROBAXIN-750) 750 MG tablet Take 1 tablet (750 mg total) by mouth every 8 (eight) hours as needed for muscle spasms.   ondansetron (ZOFRAN ODT) 4 MG disintegrating tablet Take 1 tablet (4 mg total) by mouth 2 (two) times daily as  needed for nausea or vomiting.   oxyCODONE (ROXICODONE) 5 MG immediate release tablet Take 1 tablet (5 mg total) by mouth every 8 (eight) hours as needed. (Patient taking differently: Take 5 mg by mouth every 8 (eight) hours as needed (pain).)   propranolol (INDERAL) 20 MG tablet TAKE 1 TABLET BY MOUTH THREE TIMES DAILY (Patient taking differently: Take 20 mg by mouth 3 (three) times daily.)   tamsulosin (FLOMAX) 0.4 MG CAPS capsule Take 0.4 mg by mouth daily.   vitamin B-12 (CYANOCOBALAMIN) 1000 MCG tablet Take 1,000 mcg by mouth daily.   warfarin (COUMADIN) 5 MG tablet Take as directed by the anticoagulation clinic (Patient taking differently: Take 2.5-5 mg by mouth See admin instructions. Take one tablet (5 mg) by mouth on Thursday and Sunday mornings, take 1/2 tablet (2.5 mg) on all other  mornings of the week.)     Allergies: Allergies as of 11/30/2020 - Review Complete 11/30/2020  Allergen Reaction Noted   Iohexol Hives 06/02/2007   Meperidine hcl Other (See Comments) 05/20/2005   Demerol hcl [meperidine] Other (See Comments) 11/15/2020   Prednisone Other (See Comments) 08/04/2011   Past Medical History:  Diagnosis Date   Anemia    since post op- knee replacement    Anxiety    takes xanax 2 times per day, taken mostly for ringing in ears     Arthritis of knee, right    end-stage   Bronchitis    Bursitis    Complication of anesthesia    irritation in throat postop, makes a-fib worse    Depression    DJD (degenerative joint disease)    OA- "everywhere"   Dysrhythmia    Family history of anesthesia complication    sister with nausia and vomiting   Gout    Heart murmur    HTN (hypertension)    Hyperlipidemia    Migraines    Paroxysmal atrial fibrillation (HCC)    PONV (postoperative nausea and vomiting)    Presence of permanent cardiac pacemaker    Tinnitus     Family History: Negative for clotting disorders, positive for heart attack and stroke in his mother  Social History: Lives at home with his wife, enjoys playing golf, independent in his ADLs, does not use alcohol, tobacco, or recreational drugs  Review of Systems: A complete ROS was negative except as per HPI.   Physical Exam: Blood pressure 109/79, pulse (!) 135, temperature 98.3 F (36.8 C), temperature source Oral, resp. rate (!) 7, height 5\' 6"  (1.676 m), weight 75.3 kg, SpO2 99 %. Gen: tired appearing man resting uncomfortably in bed in NAD HEENT: normocephalic atraumatic, neck supple CV: irregularly irregular rhythm, tachycardic Resp: CTAB, normal WOB GI: nontender nondistended normal bowel sounds MSK: pain with dorsiflexion of right foot Skin: incision c/d/I on the right knee, non-pitting edema present up to right mid thigh  Neuro: alert and answering questions  appriopriately  EKG: personally reviewed my interpretation is atrial fibrillation rate in the 110-120s  CXR:  Impression: cardiomegaly with bibasilar scarring and right hemidiaphragm elevation  Assessment & Plan by Problem: Active Problems:   ATRIAL FIBRILLATION  Tomi Paddock is an 80 year old male with a PMH of paroxysmal a. Fib on coumadin, hypertension, hyperlipidemia, anxiety, presenting with weakness after a total knee replacement done on 11/27/20.  Concern for pulmonary embolism/DVT Patient complaining of new onset weakness with persistent tachycardia in the 110s-120s in the setting of recent surgery and immobilization. Patient found to be in persistent atrial  fibrillation, which is atypical given his baseline is typically sinus rhythm. Patient had a prior history of a contrast reaction but denies anaphylaxis or trouble breathing. Picture further complicated by documented allergy to prednisone, though patient says that he recently took a prednisone taper and did ok on it. Patient also complaining on pain on dorsiflexion raises further concern for DVT. -CT Angio Chest PE W or Wo contrast ordered; premedicating with prednisone - Vascular ultrasound lower extremity ordered - Elevated D-dimer (1.65) -heparin 5000 Units Q8  -troponin negative, lactic acid WNL -BNP elevated to 205.6  Atrial Fibrillation Patient reportedly only in afib 3% of the time at baseline. Concern for exacerbation in setting of possible PE. Rate running 115-136 -holding warfarin and propranolol -heparin 5000 units Q8 - diltiazem drip 5-15 mg/hr  Prior History of Contrast Reaction - Premedicating with prednisone and benadryl 50 mg  Hyponatremia Sodium 125 on BMP - f/u serum osms, urine osms, urine sodium - daily BMP  Recent Total Knee Replacement Surgery on 7.5, knee appears swollen and painful on exam - tylenol 1g Q6 PRN for pain -F/U LE ultrasound  Nausea -zofran 4mg  Q8 PRN  Anemia Hemoglobin  mildly low at 11.5 in the setting of recent surgery -transfuse <7  Hypertension -holding home losartan 25 mg every other day  Anxiety Holding home xanax 0.5 -continue home fluoxetine 40 mg daily  Constipation Has not had a bowel movement since surgery -added miralax 17g BID  Dispo: Admit patient to Inpatient with expected length of stay greater than 2 midnights.  Signed: Scarlett Presto, MD 11/30/2020, 5:58 PM  Pager: [phone] After 5pm on weekdays and 1pm on weekends: On Call pager: [phone]

## 2020-11-30 NOTE — Progress Notes (Signed)
ANTICOAGULATION CONSULT NOTE - Initial Consult  Pharmacy Consult for heparin Indication: atrial fibrillation  Allergies  Allergen Reactions   Iohexol Hives    POST MYELOGRAM.  PT ALSO SAID THIS HAPPENED A YEAR AGO WITH A MYELOGRAM AND WITH ESI'S., Onset Date: [id number]    Meperidine Hcl Other (See Comments)    hallucinations   Demerol Hcl [Meperidine] Other (See Comments)    Hallucinations   Prednisone Other (See Comments)    Redness of skin & jittery    Patient Measurements: Height: 5\' 6"  (167.6 cm) Weight: 75.3 kg (166 lb) IBW/kg (Calculated) : 63.8 Heparin Dosing Weight: TBW  Vital Signs: Temp: 98.3 F (36.8 C) (07/08 1302) Temp Source: Oral (07/08 1302) BP: 109/79 (07/08 1615) Pulse Rate: 135 (07/08 1615)  Labs: Recent Labs    11/28/20 0411 11/28/20 1020 11/28/20 1020 11/30/20 1407 11/30/20 1416 11/30/20 1524  HGB  --  12.4*   < > 11.5* 10.2*  --   HCT  --  37.3*  --  32.9* 30.0*  --   PLT  --  192  --  184  --   --   LABPROT 14.4  --   --  28.1*  --   --   INR 1.1  --   --  2.6*  --   --   CREATININE  --   --   --  0.78 0.70  --   TROPONINIHS  --   --   --  16  --  13   < > = values in this interval not displayed.    Estimated Creatinine Clearance: 65.4 mL/min (by C-G formula based on SCr of 0.7 mg/dL).   Medical History: Past Medical History:  Diagnosis Date   Anemia    since post op- knee replacement    Anxiety    takes xanax 2 times per day, taken mostly for ringing in ears     Arthritis of knee, right    end-stage   Bronchitis    Bursitis    Complication of anesthesia    irritation in throat postop, makes a-fib worse    Depression    DJD (degenerative joint disease)    OA- "everywhere"   Dysrhythmia    Family history of anesthesia complication    sister with nausia and vomiting   Gout    Heart murmur    HTN (hypertension)    Hyperlipidemia    Migraines    Paroxysmal atrial fibrillation (HCC)    PONV (postoperative nausea and  vomiting)    Presence of permanent cardiac pacemaker    Tinnitus     Assessment: 82 YOM presenting with weakness and tachycardia, hx of afib on warfarin PTA, INR therapeutic at 2.6 on admission, took dose today  PTA dosing: 2.5mg  daily except 5mg  on Thurs/Sun  Goal of Therapy:  Heparin level 0.3-0.7 units/ml Monitor platelets by anticoagulation protocol: Yes   Plan:  Start heparin gtt when INR <2 Daily INR, CBC, s/s bleeding F/u ability to restart PO warfarin  Bertis Ruddy, PharmD Clinical Pharmacist ED Pharmacist Phone # [phone] 11/30/2020 7:31 PM

## 2020-12-01 ENCOUNTER — Inpatient Hospital Stay (HOSPITAL_COMMUNITY): Payer: Medicare Other

## 2020-12-01 DIAGNOSIS — M79606 Pain in leg, unspecified: Secondary | ICD-10-CM | POA: Diagnosis not present

## 2020-12-01 DIAGNOSIS — E871 Hypo-osmolality and hyponatremia: Secondary | ICD-10-CM | POA: Diagnosis present

## 2020-12-01 DIAGNOSIS — I4891 Unspecified atrial fibrillation: Secondary | ICD-10-CM | POA: Diagnosis not present

## 2020-12-01 DIAGNOSIS — M7989 Other specified soft tissue disorders: Secondary | ICD-10-CM | POA: Diagnosis not present

## 2020-12-01 LAB — ECHOCARDIOGRAM COMPLETE
AR max vel: 3.12 cm2
AV Area VTI: 2.9 cm2
AV Area mean vel: 3.28 cm2
AV Mean grad: 4 mmHg
AV Peak grad: 9.1 mmHg
Ao pk vel: 1.51 m/s
Height: 66 in
S' Lateral: 2.9 cm
Weight: 2874.8 oz

## 2020-12-01 LAB — CBC
HCT: 29.3 % — ABNORMAL LOW (ref 39.0–52.0)
Hemoglobin: 10.3 g/dL — ABNORMAL LOW (ref 13.0–17.0)
MCH: 32.4 pg (ref 26.0–34.0)
MCHC: 35.2 g/dL (ref 30.0–36.0)
MCV: 92.1 fL (ref 80.0–100.0)
Platelets: 190 10*3/uL (ref 150–400)
RBC: 3.18 MIL/uL — ABNORMAL LOW (ref 4.22–5.81)
RDW: 14.2 % (ref 11.5–15.5)
WBC: 6.6 10*3/uL (ref 4.0–10.5)
nRBC: 0 % (ref 0.0–0.2)

## 2020-12-01 LAB — BASIC METABOLIC PANEL
Anion gap: 4 — ABNORMAL LOW (ref 5–15)
BUN: 9 mg/dL (ref 8–23)
CO2: 28 mmol/L (ref 22–32)
Calcium: 8.4 mg/dL — ABNORMAL LOW (ref 8.9–10.3)
Chloride: 97 mmol/L — ABNORMAL LOW (ref 98–111)
Creatinine, Ser: 0.8 mg/dL (ref 0.61–1.24)
GFR, Estimated: >60 mL/min (ref >60)
Glucose, Bld: 157 mg/dL — ABNORMAL HIGH (ref 70–99)
Potassium: 4 mmol/L (ref 3.5–5.1)
Sodium: 129 mmol/L — ABNORMAL LOW (ref 135–145)

## 2020-12-01 LAB — PROTIME-INR
INR: 3.6 — ABNORMAL HIGH (ref 0.8–1.2)
Prothrombin Time: 35.6 seconds — ABNORMAL HIGH (ref 11.4–15.2)

## 2020-12-01 MED ORDER — OXYCODONE HCL 5 MG PO TABS
5.0000 mg | ORAL_TABLET | Freq: Three times a day (TID) | ORAL | Status: DC | PRN
  Administered 2020-12-01: 5 mg via ORAL
  Filled 2020-12-01: qty 1

## 2020-12-01 MED ORDER — TIZANIDINE HCL 2 MG PO TABS
2.0000 mg | ORAL_TABLET | Freq: Three times a day (TID) | ORAL | Status: DC | PRN
  Administered 2020-12-01 – 2020-12-03 (×2): 2 mg via ORAL
  Filled 2020-12-01 (×3): qty 1

## 2020-12-01 MED ORDER — OXYCODONE HCL 5 MG PO TABS
5.0000 mg | ORAL_TABLET | Freq: Once | ORAL | Status: AC
  Administered 2020-12-01: 5 mg via ORAL
  Filled 2020-12-01: qty 1

## 2020-12-01 MED ORDER — WARFARIN - PHARMACIST DOSING INPATIENT: Freq: Every day | Status: DC

## 2020-12-01 MED ORDER — ACETAMINOPHEN 500 MG PO TABS
1000.0000 mg | ORAL_TABLET | Freq: Three times a day (TID) | ORAL | Status: DC
  Administered 2020-12-01 – 2020-12-02 (×3): 1000 mg via ORAL
  Filled 2020-12-01 (×4): qty 2

## 2020-12-01 MED ORDER — LACTATED RINGERS IV SOLN: INTRAVENOUS | Status: AC

## 2020-12-01 MED ORDER — OXYCODONE HCL 5 MG PO TABS
5.0000 mg | ORAL_TABLET | ORAL | Status: DC | PRN
  Administered 2020-12-01 – 2020-12-02 (×2): 5 mg via ORAL
  Filled 2020-12-01 (×2): qty 1

## 2020-12-01 MED ORDER — SENNOSIDES-DOCUSATE SODIUM 8.6-50 MG PO TABS
2.0000 | ORAL_TABLET | Freq: Two times a day (BID) | ORAL | Status: DC
  Administered 2020-12-01 – 2020-12-04 (×3): 2 via ORAL
  Filled 2020-12-01 (×6): qty 2

## 2020-12-01 MED ORDER — ADULT MULTIVITAMIN W/MINERALS CH
1.0000 | ORAL_TABLET | Freq: Every day | ORAL | Status: DC
  Administered 2020-12-01 – 2020-12-04 (×4): 1 via ORAL
  Filled 2020-12-01 (×3): qty 1

## 2020-12-01 MED ORDER — METHOCARBAMOL 500 MG PO TABS
750.0000 mg | ORAL_TABLET | Freq: Three times a day (TID) | ORAL | Status: DC | PRN
  Administered 2020-12-01: 750 mg via ORAL
  Filled 2020-12-01: qty 2

## 2020-12-01 MED ORDER — ALPRAZOLAM 0.5 MG PO TABS
0.5000 mg | ORAL_TABLET | Freq: Three times a day (TID) | ORAL | Status: DC | PRN
  Administered 2020-12-01 – 2020-12-04 (×9): 0.5 mg via ORAL
  Filled 2020-12-01 (×9): qty 1

## 2020-12-01 NOTE — Progress Notes (Signed)
PT Cancellation Note  Patient Details Name: [name] [name] MRN: [id number] DOB: 1941-05-16   Cancelled Treatment:    Reason Eval/Treat Not Completed: Patient at procedure or test/unavailable - at LE vascular US, PT to check back.   Stacie Glaze, PT DPT Acute Rehabilitation Services Pager [phone]  Office [phone]    [place] 12/01/2020, 11:10 AM

## 2020-12-01 NOTE — Progress Notes (Signed)
Orthopedic Tech Progress Note Patient Details:  [name] [name] [birth year] [id number]  CPM Right Knee Right Knee Flexion (Degrees): 0 Right Knee Extension (Degrees): 90 Additional Comments: added fresh ice  Post Interventions Patient Tolerated: Well Instructions Provided: Care of device Ortho Devices Type of Ortho Device: Bone foam zero knee Ortho Device/Splint Interventions: Ordered   Post Interventions Patient Tolerated: Well Instructions Provided: Care of [name] 12/01/2020, 9:24 AM

## 2020-12-01 NOTE — Progress Notes (Signed)
Initial Nutrition Assessment  DOCUMENTATION CODES:   Not applicable  INTERVENTION:   Add MVI with minerals daily Continue Ensure Enlive po BID, each supplement provides 350 kcal and 20 grams of protein  NUTRITION DIAGNOSIS:   Increased nutrient needs related to post-op healing as evidenced by estimated needs.  GOAL:   Patient will meet greater than or equal to 90% of their needs  MONITOR:   PO intake, Supplement acceptance, Labs  REASON FOR ASSESSMENT:   Malnutrition Screening Tool    ASSESSMENT:   80 yo male admitted with A fib with RVR, weakness S/P recent TKR. PMH includes osteoarthritis, A fib, recent R TKA 11/27/20, HTN, HLD, pacemaker.  RD working remotely. No answer when room number called. Unable to obtain nutrition history from patient.    On admission MST, patient reported weight loss, but no decreased appetite or intake. Weight history reviewed. Patient lost from 80.5 kg to 75.3 kg (6.5% weight loss) in 3 months from March to June 2022. Current weight 81.5 kg. Weight changes over the past 4 months have not been severe.   Currently on a heart healthy diet. Meal intakes not documented. Patient is being offered Ensure Enlive/Plus BID between meals.  Labs reviewed. Na 129  Medications reviewed and include Miralax, Senokot-S. IVF: LR at 100 ml/h x 8 hours  Patient is at increased nutrition risk due to recent surgery and post-op weakness.   NUTRITION - FOCUSED PHYSICAL EXAM:  Unable to complete  Diet Order:   Diet Order             Diet Heart Room service appropriate? Yes; Fluid consistency: Thin  Diet effective now                   EDUCATION NEEDS:   Not appropriate for education at this time  Skin:  Skin Assessment: Skin Integrity Issues: Skin Integrity Issues:: Incisions Incisions: R knee  Last BM:  7/4  Height:   Ht Readings from Last 1 Encounters:  11/30/20 5\' 6"  (1.676 m)    Weight:   Wt Readings from Last 1 Encounters:   11/30/20 81.5 kg     BMI:  Body mass index is 29 kg/m.  Estimated Nutritional Needs:   Kcal:  2000-2200  Protein:  100-120 gm  Fluid:  >/= 2 L    Lucas Mallow, RD, LDN, CNSC Please refer to Amion for contact information.

## 2020-12-01 NOTE — Progress Notes (Addendum)
[place] for heparin Indication: atrial fibrillation  Allergies  Allergen Reactions   Iohexol Hives    POST MYELOGRAM.  PT ALSO SAID THIS HAPPENED A YEAR AGO WITH A MYELOGRAM AND WITH ESI'S., Onset Date: [id number]    Meperidine Hcl Other (See Comments)    hallucinations   Demerol Hcl [Meperidine] Other (See Comments)    Hallucinations   Prednisone Other (See Comments)    Redness of skin & jittery    Patient Measurements: Height: 5\' 6"  (167.6 cm) Weight: 81.5 kg (179 lb 10.8 oz) IBW/kg (Calculated) : 63.8 Heparin Dosing Weight: TBW  Vital Signs: Temp: 98.3 F (36.8 C) (07/09 0011) Temp Source: Oral (07/09 0011) BP: 119/68 (07/09 0011) Pulse Rate: 100 (07/09 0011)  Labs: Recent Labs    11/30/20 1407 11/30/20 1416 11/30/20 1524 12/01/20 0118  HGB 11.5* 10.2*  --  10.3*  HCT 32.9* 30.0*  --  29.3*  PLT 184  --   --  190  LABPROT 28.1*  --   --  35.6*  INR 2.6*  --   --  3.6*  CREATININE 0.78 0.70  --  0.80  TROPONINIHS 16  --  13  --      Estimated Creatinine Clearance: 72.6 mL/min (by C-G formula based on SCr of 0.8 mg/dL).   Medical History: Past Medical History:  Diagnosis Date   Anemia    since post op- knee replacement    Anxiety    takes xanax 2 times per day, taken mostly for ringing in ears     Arthritis of knee, right    end-stage   Bronchitis    Bursitis    Complication of anesthesia    irritation in throat postop, makes a-fib worse    Depression    DJD (degenerative joint disease)    OA- "everywhere"   Dysrhythmia    Family history of anesthesia complication    sister with nausia and vomiting   Gout    Heart murmur    HTN (hypertension)    Hyperlipidemia    Migraines    Paroxysmal atrial fibrillation (HCC)    PONV (postoperative nausea and vomiting)    Presence of permanent cardiac pacemaker    Tinnitus     Assessment: 3 YOM presenting with weakness and tachycardia, hx of afib anticoagulated  with warfarin PTA, last dose 11/30/20.  Warfarin PTA dosing: 2.5mg  daily except 5mg  on Thurs/Sun  INR today therapeutic at 3.6, holding PTA warfarin. Heparin not started.    Goal of Therapy:  Heparin level 0.3-0.7 units/ml Monitor platelets by anticoagulation protocol: Yes   Plan:  Start heparin gtt when INR <2 Daily INR, CBC, s/s bleeding F/u ability to restart PO warfarin  Thank you for involving pharmacy in this patient's care.  Elita Quick, PharmD PGY1 Ambulatory Care Pharmacy Resident 12/01/2020 11:27 AM  **Pharmacist phone directory can be found on [email or web address] listed under [place]**  Addendum: Consulted to resume PO warfarin. INR supratherapeutic at 3.6 today. Will hold warfarin today. Plan for INR daily, monitor for s/sx of bleeding.   Thank you for involving pharmacy in this patient's care.  Elita Quick, PharmD PGY1 Ambulatory Care Pharmacy Resident 12/01/2020 11:33 AM

## 2020-12-01 NOTE — Progress Notes (Addendum)
HD#1 Subjective:   Patient feeling a lot better this morning, says that he is less tired than yesterday and that his nausea has improved. He was able to work with the orthopedic team to work on bending him knee and he think the swelling has decreased. Able to eat more this morning than he was previously.   Objective:  Vital signs in last 24 hours: Vitals:   11/30/20 2015 11/30/20 2158 11/30/20 2308 12/01/20 0011  BP: (!) 135/98 132/89  119/68  Pulse: 80 99 98 100  Resp: 17 12 14 14   Temp:  98.5 F (36.9 C)  98.3 F (36.8 C)  TempSrc:  Oral  Oral  SpO2: 94% 91% 98% 98%  Weight:  81.5 kg    Height:  5\' 6"  (1.676 m)     Supplemental O2: Room Air SpO2: 98 %   Physical Exam:  Constitutional: elderly man lying in bed with knee elevated on exercise machine,  in no acute distress Cardiovascular: irregularly irregular rhythm, no m/r/g Pulmonary/Chest: normal work of breathing on room air MSK: normal bulk and tone, bending right knee with machine to 60 degrees Neurological: alert and answering questions appropriately Skin: incision c/d/I, swelling decreased from last night   Filed Weights   11/30/20 1303 11/30/20 2158  Weight: 75.3 kg 81.5 kg     Intake/Output Summary (Last 24 hours) at 12/01/2020 1057 Last data filed at 12/01/2020 0725 Gross per 24 hour  Intake --  Output 1550 ml  Net -1550 ml   Net IO Since Admission: -1,550 mL [12/01/20 1057]  Pertinent Labs: CBC Latest Ref Rng & Units 12/01/2020 11/30/2020 11/30/2020  WBC 4.0 - 10.5 K/uL 6.6 - 9.8  Hemoglobin 13.0 - 17.0 g/dL 10.3(L) 10.2(L) 11.5(L)  Hematocrit 39.0 - 52.0 % 29.3(L) 30.0(L) 32.9(L)  Platelets 150 - 400 K/uL 190 - 184    CMP Latest Ref Rng & Units 12/01/2020 11/30/2020 11/30/2020  Glucose 70 - 99 mg/dL 157(H) 103(H) 107(H)  BUN 8 - 23 mg/dL 9 16 13   Creatinine 0.61 - 1.24 mg/dL 0.80 0.70 0.78  Sodium 135 - 145 mmol/L 129(L) 125(L) 125(L)  Potassium 3.5 - 5.1 mmol/L 4.0 4.1 3.8  Chloride 98 - 111 mmol/L  97(L) 91(L) 91(L)  CO2 22 - 32 mmol/L 28 - 26  Calcium 8.9 - 10.3 mg/dL 8.4(L) - 8.5(L)  Total Protein 6.5 - 8.1 g/dL - - 5.8(L)  Total Bilirubin 0.3 - 1.2 mg/dL - - 1.3(H)  Alkaline Phos 38 - 126 U/L - - 50  AST 15 - 41 U/L - - 31  ALT 0 - 44 U/L - - 17     Assessment/Plan:   Principal Problem:   Atrial fibrillation with RVR (HCC) Active Problems:   Long term current use of anticoagulant   S/P total knee arthroplasty, right   Hyponatremia   Patient Summary: [name] [name] is a 80 y.o. with a PMH of paroxysmal a. Fib on coumadin, hypertension, hyperlipidemia, anxiety, presenting with weakness after a total knee replacement done on 11/27/20  Paroxysmal  Atrial Fibrillation with RVR Rate improving on the diltiazem drip. Ruled out PE, favor exacerbation in the setting of recent surgical stress. Sees [facility] as an outpatient and patient requested a consult. - restart warfarin per pharamcy, INR supratherapeutic today - continue diltiazem drip, may consider restarting outpatient BB  Hypovolemic Hyponatremia; improving Sodium improved to 129 in the context of poor PO intake following surgery - 165mL/hr LR for 8 hours - daily BMP  Recent Total Knee Replacement Surgery on 7/5, knee appears swollen and painful on exam; swelling improved 7/9. - continue to work with PT - tylenol 1g Q6 PRN, oxycodone 5 mg Q8 PRN, methocarbamol 750 mg Q8 PRN for pain -F/U LE ultrasound  Hypertension -holding both home losartan (25 mg every other day) and propranolol 20 mg TID   Prior History of Contrast Reaction - Premedicating with prednisone and benadryl 50 mg   Nausea -zofran 4mg  Q8 PRN   Anemia Hemoglobin mildly low at in the setting of recent surgery - hemoglobin 10.3 as of 7/9 -transfuse <7   Anxiety Holding home xanax 0.5 -continue home fluoxetine 40 mg daily   Constipation -miralax 17g BID, senna 2x daily   Prior to Admission Living Arrangement: home Anticipated  Discharge Location: home Barriers to Discharge: medical stabilization of atrial fibrillation Dispo: Anticipated discharge in 2-3 days pending medical stabilization  Scarlett Presto, MD Internal Medicine Resident PGY-1 Pager [phone] Please contact the on call pager after 5 pm and on weekends at [phone].

## 2020-12-01 NOTE — Progress Notes (Addendum)
OVERNIGHT ROUNDING NOTE:  Paged to bedside for persistent tachycardia while on the diltiazem drip.   [name] [name] denies chest pain, dizziness, lightheadedness. He endorses difficulty sleeping and feeling "antsy". He continues to have pain in the right knee and extending inferiorly into the foot.   On exam, blood pressure is 97/71; HR 110s-140s on diltaizem gtt @9 .5mg /hr, breathing comfortably and in no distress. He has compression socks on the bilateral lower extremities. His foot is warm and sensation is intact and equal to the left. He has a strong right posterior tibial pulse. I had difficulty palpating a dorsalis pedis on the right but was good on doppler.   Assessment: Afib RVR--suspect his tachycardia is being exacerbated by multiple things including benzo withdrawal and pain. Fortunately, CTA was negative for PE.  Benzodiazepine withdrawal--I reviewed his home medication list which includes xanax 0.5mg  TID and prozac. Last doses were taken yesterday, 7/8, AM. Suspect that he may be experiencing some withdrawal from the xanax which he reports taking three times daily, including before bed.  Right lower extremity pain is also a likely contributor. LE duplex ordered on admission to r/o VTE. Heparin gtt on hold with INR 3.5.  I also considered arterial occlusion however he has good pedal pulses, making this less likely.  Hypotonic hyponatremia possibly from volume depletion, also question component of SIADH in the setting of pain. Sodium is up to 129 this morning. I am not able to find a history of heart failure however BNP was ~200 so will defer decision regarding fluids to the day team.  Plan -resume home xanax, prozac will be due later this morning -f/u RLE duplex -consider restarting warfarin if cleared by ortho  Mitzi Hansen, MD Internal Medicine Resident PGY-2 [facility] Pager: [phone] 12/01/2020 5:11 AM

## 2020-12-01 NOTE — Consult Note (Addendum)
Cardiology Consultation:   Patient ID: [name] [name] MRN: [id number]; DOB: 15-Jul-1940  Admit date: 11/30/2020 Date of Consult: 12/01/2020  PCP:  Sherald Hess., MD   [facility] Providers Cardiologist:  Cristopher Peru, MD        Patient Profile:   [name] [name] is a 80 y.o. male with a hx of PAF, with catheter ablation, sinus node dysfunction with placement of PPM who is being seen 12/01/2020 for the evaluation of atrial fib at the request of Dr. Evette Doffing.  History of Present Illness:   [name] [name] with above hx and last pacer interrogation of Medtronic device with 07/2020 and he was maintaining NSR about 97% of the time.  Battery with 5 years left.    Last echo 2013 EF 50% with septal dyssynergy mild LVH, trivial AR mild MR.  Pacer transmission 11/29/20 with 100 AHR episodes a fib burden 1.9% similar to prior   Remote cardiac cath in 2005 with mild disease of 40% in LAD. EF 55-60%   Pt had R total knee surgery 11/27/20 and discharged on the 6th.  Presented to ER yesterday with general weakness, fatigue.(Which has been ongoing since surgery) but PT noticed rapid HR so ortho instructed to come to ER.    He had been off coumadin for surgery and resumed on the 8th. He was found to be in A fib with RVR in ER.   Neg CT angio for PE. Placed on dilt drip - had hyponatremia and given gentle hydration.  Pt remained with HR 110-140s.  No chest pain.    Na 128, K+ 4.0 glucose 157 BUN 9 Cr 0.80 Ca 8.4  Hs troponin 16 and 13 Hgb 11.5, WBC 9.8 plts 184  INR 3.6 He took coumadin yesterday AM  Dopplers of lower ext done venous with no DVT, Rt foot with tingling-especially the toes, some swelling do not palpate pulse but feet are equally warm.  No chest pain no SOB, lying flat in bed and no awareness of rapid HR.  His wife notes that when he has a lot of pain more likely to have atrial fib.  Past Medical History:  Diagnosis Date   Anemia    since post op- knee replacement    Anxiety    takes  xanax 2 times per day, taken mostly for ringing in ears     Arthritis of knee, right    end-stage   Bronchitis    Bursitis    Complication of anesthesia    irritation in throat postop, makes a-fib worse    Depression    DJD (degenerative joint disease)    OA- "everywhere"   Dysrhythmia    Family history of anesthesia complication    sister with nausia and vomiting   Gout    Heart murmur    HTN (hypertension)    Hyperlipidemia    Migraines    Paroxysmal atrial fibrillation (HCC)    PONV (postoperative nausea and vomiting)    Presence of permanent cardiac pacemaker    Tinnitus     Past Surgical History:  Procedure Laterality Date   APPENDECTOMY     Atrial fibrillation ablation  2009, 12/02/11   PVI by Dr Rayann Heman x 2   ATRIAL FIBRILLATION ABLATION N/A 12/02/2011   Procedure: ATRIAL FIBRILLATION ABLATION;  Surgeon: Thompson Grayer, MD;  Location: [facility] CATH LAB;  Service: Cardiovascular;  Laterality: N/A;   bilteral thumb joint surgrery     CARDIAC CATHETERIZATION  2005  which revealed 40% stenosis of the mid left anterior descending artery., ablation- 2009   CHOLECYSTECTOMY     JOINT REPLACEMENT     L knee- 12/11,R hip- 1996   left rotator cuff repair     PACEMAKER INSERTION  06/06/2005   Medtronic EnRhythm dual-chamber. For sick sinus syndrome   PERMANENT PACEMAKER GENERATOR CHANGE N/A 03/12/2012   Procedure: PERMANENT PACEMAKER GENERATOR CHANGE;  Surgeon: Evans Lance, MD;  Location: [facility] CATH LAB;  Service: Cardiovascular;  Laterality: N/A;   RHINOPLASTY     right rotator cuff surgery  2006   right total hip arthroplasty     TEE WITHOUT CARDIOVERSION  12/01/2011   Procedure: TRANSESOPHAGEAL ECHOCARDIOGRAM (TEE);  Surgeon: Fay Records, MD;  Location: [place];  Service: Cardiovascular;  Laterality: N/A;  a-fib ablation following day   TONSILLECTOMY     as a child   TOTAL KNEE ARTHROPLASTY Right 11/27/2020   Procedure: TOTAL KNEE ARTHROPLASTY;  Surgeon: Renette Butters, MD;   Location: [facility];  Service: Orthopedics;  Laterality: Right;   TOTAL KNEE REVISION  08/08/2011   Procedure: TOTAL KNEE REVISION;  Surgeon: Kerin Salen, MD;  Location: [place];  Service: Orthopedics;  Laterality: Left;  DEPUY/SIGMA   TRANSESOPHAGEAL ECHOCARDIOGRAM  04/2008   TRANSTHORACIC ECHOCARDIOGRAM  [id number]   uvulectomy       Home Medications:  Prior to Admission medications   Medication Sig Start Date End Date Taking? Authorizing Provider  acetaminophen (TYLENOL) 500 MG tablet Take 2 tablets (1,000 mg total) by mouth every 6 (six) hours as needed for mild pain or moderate pain. 11/28/20  Yes Gawne, Meghan M, PA-C  ALPRAZolam Duanne Moron) 0.5 MG tablet Take 1 tablet (0.5 mg total) by mouth 3 (three) times daily as needed for anxiety. Patient taking differently: Take 0.5 mg by mouth 3 (three) times daily. 03/28/13  Yes Mashburn, Marlane Hatcher, PA-C  Carboxymethylcellulose Sodium (THERATEARS OP) Place 1 drop into both eyes at bedtime.   Yes [provider]  docusate sodium (COLACE) 100 MG capsule Take 100 mg by mouth 2 (two) times daily.   Yes [provider]  FLUoxetine (PROZAC) 40 MG capsule Take one tablet each day for depression. (40mg  ) Patient taking differently: Take 40 mg by mouth daily. 03/28/13  Yes Mashburn, Milta Deiters T, PA-C  gabapentin (NEURONTIN) 300 MG capsule Take 1 capsule (300 mg total) by mouth 2 (two) times daily for 14 days. For 2 weeks post op for pain. 11/28/20 12/12/20 Yes Gawne, Meghan M, PA-C  losartan (COZAAR) 25 MG tablet One tablet by mouth every other day Patient taking differently: Take 25 mg by mouth every other day. 06/18/20  Yes Evans Lance, MD  methocarbamol (ROBAXIN-750) 750 MG tablet Take 1 tablet (750 mg total) by mouth every 8 (eight) hours as needed for muscle spasms. 11/28/20  Yes Gawne, Meghan M, PA-C  ondansetron (ZOFRAN ODT) 4 MG disintegrating tablet Take 1 tablet (4 mg total) by mouth 2 (two) times daily as needed for nausea or vomiting. 11/28/20  Yes  Gawne, Meghan M, PA-C  oxyCODONE (ROXICODONE) 5 MG immediate release tablet Take 1 tablet (5 mg total) by mouth every 8 (eight) hours as needed. Patient taking differently: Take 5 mg by mouth every 8 (eight) hours as needed (pain). 11/28/20 11/28/21 Yes Gawne, Meghan M, PA-C  propranolol (INDERAL) 20 MG tablet TAKE 1 TABLET BY MOUTH THREE TIMES DAILY Patient taking differently: Take 20 mg by mouth 3 (three) times daily. 09/17/20  Yes  Evans Lance, MD  tamsulosin (FLOMAX) 0.4 MG CAPS capsule Take 0.4 mg by mouth daily. 08/10/20  Yes [provider]  vitamin B-12 (CYANOCOBALAMIN) 1000 MCG tablet Take 1,000 mcg by mouth daily.   Yes [provider]  warfarin (COUMADIN) 5 MG tablet Take as directed by the anticoagulation clinic Patient taking differently: Take 2.5-5 mg by mouth See admin instructions. Take one tablet (5 mg) by mouth on Thursday and Sunday mornings, take 1/2 tablet (2.5 mg) on all other mornings of the week. 10/29/20  Yes Evans Lance, MD  Cholecalciferol (DIALYVITE VITAMIN D 5000) 125 MCG (5000 UT) capsule Take 5,000 Units by mouth 3 (three) times a week.    [provider]  ferrous gluconate (FERGON) 240 (27 FE) MG tablet Take 240 mg by mouth daily.    [provider]  omeprazole (PRILOSEC OTC) 20 MG tablet Take 1 tablet (20 mg total) by mouth daily. For gastric protection 11/28/20 12/28/20  Britt Bottom, PA-C  Potassium 99 MG TABS Take 99 mg by mouth daily.    [provider]  potassium chloride SA (K-DUR,KLOR-CON) 20 MEQ tablet Take one tablet two times a day for low potassium. Patient not taking: No sig reported 03/28/13   Ruben Im, PA-C    Inpatient Medications: Scheduled Meds:  feeding supplement  237 mL Oral BID BM   FLUoxetine  40 mg Oral Daily   mouth rinse  15 mL Mouth Rinse BID   polyethylene glycol  17 g Oral BID   senna-docusate  2 tablet Oral BID   Continuous Infusions:  diltiazem (CARDIZEM) infusion 9.5 mg/hr  (12/01/20 1030)   PRN Meds: acetaminophen, ALPRAZolam, methocarbamol, ondansetron, oxyCODONE  Allergies:    Allergies  Allergen Reactions   Iohexol Hives    POST MYELOGRAM.  PT ALSO SAID THIS HAPPENED A YEAR AGO WITH A MYELOGRAM AND WITH ESI'S., Onset Date: [id number]    Meperidine Hcl Other (See Comments)    hallucinations   Demerol Hcl [Meperidine] Other (See Comments)    Hallucinations   Prednisone Other (See Comments)    Redness of skin & jittery    Social History:   Social History   Socioeconomic History   Marital status: Married    Spouse name: Not on file   Number of children: 2   Years of education: Not on file   Highest education level: Not on file  Occupational History   Occupation: retired    Fish farm manager: RETIRED  Tobacco Use   Smoking status: Never   Smokeless tobacco: Never  Vaping Use   Vaping Use: Never used  Substance and Sexual Activity   Alcohol use: Not Currently    Comment: rare use   Drug use: No   Sexual activity: Not on file  Other Topics Concern   Not on file  Social History Narrative   Not on file   Social Determinants of Health   Financial Resource Strain: Not on file  Food Insecurity: Not on file  Transportation Needs: Not on file  Physical Activity: Not on file  Stress: Not on file  Social Connections: Not on file  Intimate Partner Violence: Not on file    Family History:    Family History  Problem Relation Age of Onset   Coronary artery disease Mother    Cerebrovascular Disease Mother    Cancer Other    Anesthesia problems Neg Hx    Hypotension Neg Hx    Malignant hyperthermia Neg Hx  Pseudochol deficiency Neg Hx      ROS:  Please see the history of present illness.  General:no colds or fevers, no weight changes Skin:no rashes or ulcers HEENT:no blurred vision, no congestion CV:see HPI PUL:see HPI GI:no diarrhea constipation or melena, no indigestion GU:no hematuria, no dysuria MS:no joint pain, no  claudication Neuro:no syncope, no lightheadedness Endo:no diabetes, no thyroid disease  All other ROS reviewed and negative.     Physical Exam/Data:   Vitals:   11/30/20 2015 11/30/20 2158 11/30/20 2308 12/01/20 0011  BP: (!) 135/98 132/89  119/68  Pulse: 80 99 98 100  Resp: 17 12 14 14   Temp:  98.5 F (36.9 C)  98.3 F (36.8 C)  TempSrc:  Oral  Oral  SpO2: 94% 91% 98% 98%  Weight:  81.5 kg    Height:  5\' 6"  (1.676 m)      Intake/Output Summary (Last 24 hours) at 12/01/2020 1106 Last data filed at 12/01/2020 0725 Gross per 24 hour  Intake --  Output 1550 ml  Net -1550 ml   Last 3 Weights 11/30/2020 11/30/2020 11/27/2020  Weight (lbs) 179 lb 10.8 oz 166 lb 166 lb 0.1 oz  Weight (kg) 81.5 kg 75.297 kg 75.3 kg  Some encounter information is confidential and restricted. Go to Review Flowsheets activity to see all data.     Body mass index is 29 kg/m.  General:  Well nourished, well developed, in no acute distress though has a lot of knee pain HEENT: normal Lymph: no adenopathy Neck: no JVD Endocrine:  No thryomegaly Vascular: No carotid bruits; FA pulses 2+ bilaterally without bruits  Cardiac:  irreg irreg; no murmur gallup rub or click Lungs:  clear to auscultation bilaterally, ant. no wheezing, rhonchi or rales  Abd: soft, nontender, no hepatomegaly  Ext: stockings in place feet equally warm , Rt knee not swolledn Musculoskeletal:  No deformities, BUE and BLE strength normal and equal Skin: warm and dry  Neuro:  alert and oriented X 3 MAE follows commands, no focal abnormalities noted Psych:  Normal affect   EKG:  The EKG was personally reviewed and demonstrates:  atrial fib rates vary on EKGs from 80 to 113.  No acute ST changes. Telemetry:  Telemetry was personally reviewed and demonstrates:  atrial fib rates from 140 to 109   Relevant CV Studies: Echo 10/2011  Study Conclusions   - Left ventricle: Septal dyssynergy. (IVCD). The cavity size    was normal. Wall  thickness was increased in a pattern of    mild LVH. The estimated ejection fraction was 50%.  - Aortic valve: Trivial regurgitation.  - Mitral valve: Mild regurgitation.  - Left atrium: The atrium was mildly dilated.  - Right ventricle: The cavity size was mildly dilated.  - Impressions: On the prior study of 2009, the septum moved    normally (QRS was narrow).  Impressions:   - On the prior study of 2009, the septum moved normally (QRS    was narrow).   ------------------------------------------------------------  Labs, prior tests, procedures, and surgery:  Permanent pacemaker system implantation.   Transthoracic echocardiography.  M-mode, complete 2D,  spectral Doppler, and color Doppler.  Height:  Height:  172.7cm. Height: 68in.  Weight:  Weight: 85.7kg. Weight:  188.6lb.  Body mass index:  BMI: 28.7kg/m^2.  Body surface  area:    BSA: 20m^2.  Blood pressure:     118/78.  Patient  status:  Outpatient.  Location:  [facility]   ------------------------------------------------------------   ------------------------------------------------------------  Left ventricle:  Septal dyssynergy. (IVCD). The cavity size  was normal. Wall thickness was increased in a pattern of  mild LVH. The estimated ejection fraction was 50%.   ------------------------------------------------------------  Aortic valve:   The valve appears to be grossly normal.  Doppler:   Trivial regurgitation.   ------------------------------------------------------------  Aorta:  Aortic root: The aortic root was normal in size.   ------------------------------------------------------------  Mitral valve:   The valve appears to be grossly normal.  Doppler:   Mild regurgitation.    Peak gradient: 106mm Hg (D).     ------------------------------------------------------------  Left atrium:  The atrium was mildly dilated.   ------------------------------------------------------------  Right ventricle:  The  cavity size was mildly dilated. Pacer  wire or catheter noted in right ventricle. Systolic function  was normal.   ------------------------------------------------------------  Pulmonic valve:    The valve appears to be grossly normal.   Doppler:   No significant regurgitation.   ------------------------------------------------------------  Tricuspid valve:   The valve appears to be grossly normal.   Doppler:   Mild regurgitation.   ------------------------------------------------------------  Right atrium:  The atrium was at the upper limits of normal  in size.   ------------------------------------------------------------  Pericardium:  There was no pericardial effusion.   ------------------------------------------------------------  Laboratory Data:  High Sensitivity Troponin:   Recent Labs  Lab 11/30/20 1407 11/30/20 1524  TROPONINIHS 16 13     Chemistry Recent Labs  Lab 11/30/20 1407 11/30/20 1416 12/01/20 0118  NA 125* 125* 129*  K 3.8 4.1 4.0  CL 91* 91* 97*  CO2 26  --  28  GLUCOSE 107* 103* 157*  BUN 13 16 9   CREATININE 0.78 0.70 0.80  CALCIUM 8.5*  --  8.4*  GFRNONAA >60  --  >60  ANIONGAP 8  --  4*    Recent Labs  Lab 11/30/20 1407  PROT 5.8*  ALBUMIN 3.0*  AST 31  ALT 17  ALKPHOS 50  BILITOT 1.3*   Hematology Recent Labs  Lab 11/28/20 1020 11/30/20 1407 11/30/20 1416 12/01/20 0118  WBC 10.0 9.8  --  6.6  RBC 3.88* 3.56*  --  3.18*  HGB 12.4* 11.5* 10.2* 10.3*  HCT 37.3* 32.9* 30.0* 29.3*  MCV 96.1 92.4  --  92.1  MCH 32.0 32.3  --  32.4  MCHC 33.2 35.0  --  35.2  RDW 14.6 14.4  --  14.2  PLT 192 184  --  190   BNP Recent Labs  Lab 11/30/20 1407  BNP 205.6*    DDimer  Recent Labs  Lab 11/30/20 1643  DDIMER 1.65*     Radiology/Studies:  CT Angio Chest PE W and/or Wo Contrast  Result Date: 11/30/2020 CLINICAL DATA:  Shortness of breath. Right knee pain surgery this past week. Off Coumadin dental yesterday. EXAM: CT  ANGIOGRAPHY CHEST WITH CONTRAST TECHNIQUE: Multidetector CT imaging of the chest was performed using the standard protocol during bolus administration of intravenous contrast. Multiplanar CT image reconstructions and MIPs were obtained to evaluate the vascular anatomy. CONTRAST:  1mL OMNIPAQUE IOHEXOL 350 MG/ML SOLN COMPARISON:  CT chest 05/03/2005 FINDINGS: Cardiovascular: Left chest wall 2 lead cardiac pacemaker. Normal heart size. No significant pericardial effusion. The thoracic aorta is normal in caliber. No atherosclerotic plaque of the thoracic aorta. No coronary artery calcifications. The main pulmonary artery is normal in caliber. No central pulmonary embolus. Mediastinum/Nodes: No enlarged mediastinal, hilar, or axillary lymph nodes. Thyroid gland, trachea, and esophagus demonstrate no significant findings. Lungs/Pleura: Expiratory phase of  respiration. Elevated right hemidiaphragm. Linear atelectasis within the right middle lobe. No pulmonary nodule. No pulmonary mass. Peribronchovascular ground-glass airspace opacity within the right lung consistent with atelectasis. Trace left pleural effusion. No right pleural effusion. No pneumothorax. Upper Abdomen: Status post cholecystectomy.  No acute abnormality. Musculoskeletal: No chest wall abnormality. No suspicious lytic or blastic osseous lesions. No acute displaced fracture. Multilevel degenerative changes of the spine. Review of the MIP images confirms the above findings. IMPRESSION: 1. No pulmonary embolus. 2. Trace left pleural effusion. 3. Aortic Atherosclerosis (ICD10-I70.0). Electronically Signed   By: Iven Finn M.D.   On: 11/30/2020 17:44   DG Chest Portable 1 View  Result Date: 11/30/2020 CLINICAL DATA:  Palpitations and weakness EXAM: PORTABLE CHEST 1 VIEW COMPARISON:  04/29/2018 FINDINGS: Pacer with leads at right atrium and right ventricle. No lead discontinuity. Midline trachea. Reverse apical lordotic positioning. Normal heart size  for level of inspiration. Atherosclerosis in the transverse aorta. Moderate right hemidiaphragm elevation No pleural effusion or pneumothorax. Low lung volumes, accentuating pulmonary interstitium. Right midlung and bibasilar scarring. No congestive failure. No lobar consolidation. Right upper quadrant surgical clips. IMPRESSION: No acute cardiopulmonary disease. Similar appearance of cardiomegaly with bibasilar scarring and right hemidiaphragm elevation. Aortic Atherosclerosis (ICD10-I70.0). Electronically Signed   By: Abigail Miyamoto M.D.   On: 11/30/2020 14:08   DG Knee Right Port  Result Date: 11/27/2020 CLINICAL DATA:  Status post RIGHT knee replacement, images obtained in the PACU in an 80 year old male. EXAM: PORTABLE RIGHT KNEE - 1-2 VIEW COMPARISON:  None aside from LEFT knee images from 2013. FINDINGS: Post RIGHT knee arthroplasty. Small amounts of gas and fluid in the joint as expected in the immediate postoperative setting. No acute findings with unremarkable appearance of arthroplasty on AP and lateral views. IMPRESSION: Expected appearance of total RIGHT knee arthroplasty. No acute complication seen. Electronically Signed   By: Zetta Bills M.D.   On: 11/27/2020 13:14     Assessment and Plan:   Atrial fib RVR on dilt drip, a fib seems to be aggravated by knee pain.  (Hx of PAF) Continue dilt drip BP from 117/65 to 132/81 - pt has been on inderal 20 mg TID, ? Resume inderal and wean dilt defer to Dr. Acie Fredrickson.  If DCCV needed he will need TEE as he was off anticoagulation for 6 days.  Echo pending last one in 2013 Anticoagulation pt's coumadin held for surgery and resumed 11/28/20 INR was 1.1 on 11/28/20.  Coumadin was held 5 days prior to procedure. No crossover needed.   INR today 3.6 Pharmacy following. SSS with hx PAF and catheter ablation and sinus node dysfunction with PPM MDT.  HTN controlled Last cath 2005 with only 40% LAD lesion. No angina.    Risk Assessment/Risk Scores:           CHA2DS2-VASc Score = 3  This indicates a 3.2% annual risk of stroke. The patient's score is based upon: CHF History: No HTN History: Yes Diabetes History: No Stroke History: No Vascular Disease History: No Age Score: 2 Gender Score: 0         For questions or updates, please contact [place] Please consult [email or web address] for contact info under    Signed, Cecilie Kicks, NP  12/01/2020 11:06 AM    Attending Note:   The patient was seen and examined.  Agree with assessment and plan as noted above.  Changes made to the above note as needed.  Patient seen and independently examined with Cecilie Kicks,  NP.   We discussed all aspects of the encounter. I agree with the assessment and plan as stated above.     Atrial fib:   The patient has known paroxysmal atrial fibrillation.  Typically his A. fib is very well controlled and he has a low A. fib burden.  With distress and pain of his knee surgery he has had more atrial fibrillation.  He essentially is asymptomatic from an A. fib standpoint.  His wife does state that he is very weak and has difficulty sitting up in the bed or getting up from a chair.  These are not symptoms that would be caused by atrial fibrillation.  We discussed the fact that atrial fibrillation may cause him to be a little winded when he is out walking but it does not reduce his muscular strength or core strength causing him to not be able to get out of bed.  He has resumed his warfarin.  Pharmacy is managing that for [place].  His INR is already therapeutic which is surprising. I suspect that his A. fib burden will gradually decrease as he gets further out from his surgery.  We will continue with the same medications.  CT scan of the chest was negative for pulmonary embolus.     I have spent a total of 40 minutes with patient reviewing hospital  notes , telemetry, EKGs, labs and examining patient as well as establishing an assessment and plan that was discussed  with the patient.  > 50% of time was spent in direct patient care.    Thayer Headings, Brooke Bonito., MD, [facility] 12/01/2020, 2:04 PM [address]  [place] Pager [phone]

## 2020-12-01 NOTE — Evaluation (Signed)
Physical Therapy Evaluation Patient Details Name: [name] [name] MRN: [id number] DOB: 28-Apr-1941 Today's Date: 12/01/2020   History of Present Illness  80 yo male presents to [facility] on 7/8 with generalized weakness, fatigue, tachycardia in afib rhythm. Pt with recent R TKR on 7/5, d/c from acute setting 7/6. PMH includes L TKA, right rotator cuff surgery, depression, DJD, tinnitus, paroxysmal afib, HTN, anxiety, pacemaker.  Clinical Impression   Pt presents with weakness, RLE post-operative pain, impaired R knee ROM, min difficulty performing mobility tasks, and decreased activity tolerance. Pt to benefit from acute PT to address deficits. Pt ambulated short hallway distance, limited mostly by IV leaking (RN notified). Pt in afib rhythm throughout session, with HR 100-144 bpm erratically during session. PT recommending continue with [place] services at d/c. PT to progress mobility as tolerated, and will continue to follow acutely.      Follow Up Recommendations Home health PT;Supervision for mobility/OOB    Equipment Recommendations  None recommended by PT    Recommendations for Other Services       Precautions / Restrictions Precautions Precautions: Fall Restrictions Weight Bearing Restrictions: No RLE Weight Bearing: Weight bearing as tolerated      Mobility  Bed Mobility Overal bed mobility: Needs Assistance Bed Mobility: Supine to Sit;Sit to Supine     Supine to sit: Min assist Sit to supine: Min assist   General bed mobility comments: min assist for LE lifting and translation into/out of bed, trunk elevation. Increased time.    Transfers Overall transfer level: Needs assistance Equipment used: Rolling walker (2 wheeled) Transfers: Sit to/from Stand Sit to Stand: Min assist         General transfer comment: Min assist for rise, steady. AP rocking utilized for [employer].  Ambulation/Gait Ambulation/Gait assistance: Min guard Gait Distance (Feet): 60  Feet Assistive device: Rolling walker (2 wheeled) Gait Pattern/deviations: Antalgic;Step-through pattern;Decreased stride length;Trunk flexed Gait velocity: decr   General Gait Details: min guard for safety, verbal cuing for upright posture, placement in RW.  Stairs            Wheelchair Mobility    Modified Rankin (Stroke Patients Only)       Balance Overall balance assessment: Needs assistance Sitting-balance support: No upper extremity supported;Feet supported Sitting balance-Leahy Scale: Good     Standing balance support: Bilateral upper extremity supported;During functional activity Standing balance-Leahy Scale: Poor Standing balance comment: reliant on external support                             Pertinent Vitals/Pain Pain Assessment: 0-10 Pain Score: 8  Pain Location: R knee Pain Descriptors / Indicators: Aching;Sore Pain Intervention(s): Limited activity within patient's tolerance;Monitored during session;Repositioned    Home Living Family/patient expects to be discharged to:: Private residence Living Arrangements: Spouse/significant other Available Help at Discharge: Family;Available 24 hours/day Type of Home: House Home Access: Stairs to enter Entrance Stairs-Rails: Left Entrance Stairs-Number of Steps: 3 Home Layout: Able to live on main level with bedroom/bathroom;Two level Home Equipment: Shower seat;Adaptive equipment;Walker - 2 wheels;Cane - single point;Cane - quad;Wheelchair - manual      Prior Function Level of Independence: Needs assistance   Gait / Transfers Assistance Needed: pt reports having significant difficulty transferring/gait after knee replacement and once d/c on 7/6.  ADL's / Homemaking Assistance Needed: Pt reports still able to perform ADLs        Hand Dominance   Dominant Hand: Right  Extremity/Trunk Assessment   Upper Extremity Assessment Upper Extremity Assessment: Overall WFL for tasks assessed     Lower Extremity Assessment Lower Extremity Assessment: Generalized weakness;RLE deficits/detail RLE Deficits / Details: post-operative weakness; able to perform quad set, heel slide to 45* with min overpressure, lacking ~10 degrees terminal knee extension    Cervical / Trunk Assessment Cervical / Trunk Assessment: Normal  Communication   Communication: No difficulties  Cognition Arousal/Alertness: Awake/alert Behavior During Therapy: WFL for tasks assessed/performed Overall Cognitive Status: Within Functional Limits for tasks assessed                                        General Comments General comments (skin integrity, edema, etc.): HR 97-144 bpm in afib rhythm, x1 where HR jumped to 158 bpm but not sustained. R forearm IV lightly leaking sanguine-tinted fluid, RN called and states she will address.    Exercises Total Joint Exercises Heel Slides: AAROM;Right;10 reps;Supine Long Arc Quad: AAROM;Right;10 reps;Seated   Assessment/Plan    PT Assessment Patient needs continued PT services  PT Problem List Decreased strength;Decreased range of motion;Decreased balance;Decreased knowledge of use of DME;Pain;Decreased mobility;Decreased activity tolerance       PT Treatment Interventions DME instruction;Therapeutic activities;Gait training;Therapeutic exercise;Patient/family education;Balance training;Functional mobility training;Neuromuscular re-education;Stair training    PT Goals (Current goals can be found in the Care Plan section)  Acute Rehab PT Goals Patient Stated Goal: go home, get stronger PT Goal Formulation: With patient Time For Goal Achievement: 12/15/20 Potential to Achieve Goals: Good    Frequency Min 5X/week   Barriers to discharge        Co-evaluation               AM-PAC PT "6 Clicks" Mobility  Outcome Measure Help needed turning from your back to your side while in a flat bed without using bedrails?: A Little Help needed  moving from lying on your back to sitting on the side of a flat bed without using bedrails?: A Little Help needed moving to and from a bed to a chair (including a wheelchair)?: A Little Help needed standing up from a chair using your arms (e.g., wheelchair or bedside chair)?: A Little Help needed to walk in hospital room?: A Little Help needed climbing 3-5 steps with a railing? : A Little 6 Click Score: 18    End of Session Equipment Utilized During Treatment: Gait belt Activity Tolerance: Patient tolerated treatment well Patient left: in chair;with call bell/phone within reach;with chair alarm set Nurse Communication: Mobility status;Other (comment) (IV leaking) PT Visit Diagnosis: Other abnormalities of gait and mobility (R26.89);Pain Pain - Right/Left: Right Pain - part of body: Knee    Time: 6599-3570 PT Time Calculation (min) (ACUTE ONLY): 39 min   Charges:   PT Evaluation $PT Eval Low Complexity: 1 Low PT Treatments $Gait Training: 8-22 mins      Stacie Glaze, PT DPT Acute Rehabilitation Services Pager [phone]  Office [phone]   [place] E Ruffin Pyo 12/01/2020, 3:13 PM

## 2020-12-01 NOTE — Progress Notes (Signed)
  Echocardiogram 2D Echocardiogram has been performed.  Merrie Roof F 12/01/2020, 1:41 PM

## 2020-12-01 NOTE — Progress Notes (Signed)
Patient ID: [name] [name], male   DOB: November 23, 1940, 80 y.o.   MRN: [id number]    ORTHOPAEDIC PROGRESS NOTE  s/p right total knee on 11/27/2000  SUBJECTIVE: Patient admitted yesterday with afib and rapid ventricular response.  Heart rate improved.  Will need to get up with physical therapy and will need CPM 0-90 degrees  Reports mild to moderate pain about operative site. No chest pain. No SOB. No nausea/vomiting. No other complaints.  OBJECTIVE: Right knee wound well approximated and dressed with mepelex AAROM -5-90 PE:  Vitals:   11/30/20 2308 12/01/20 0011  BP:  119/68  Pulse: 98 100  Resp: 14 14  Temp:  98.3 F (36.8 C)  SpO2: 98% 98%     ASSESSMENT: [name] [name] is a 80 y.o. male doing well postoperatively.  PLAN: Weightbearing: WBAT RLE Insicional and dressing care: Dressings left intact until follow-up Orthopedic device(s): None Showering: not yet VTE prophylaxis:  warfarin   indefinitely due to afib Pain control: pain under acceptable control.  Patient had difficulty tolerating meds due to over sedation Follow - up plan:  will order CPM 0-90 degrees for 2 hours TID while in the hospital.  Will need zero degree knee foam for when not in CPM.  Up to chair lots and daily physical therapy since he is so acutely post op Contact information:   .k

## 2020-12-01 NOTE — Progress Notes (Signed)
Lower extremity venous has been completed.   Preliminary results in CV Proc.   Abram Sander 12/01/2020 11:21 AM

## 2020-12-02 LAB — CBC
HCT: 26.1 % — ABNORMAL LOW (ref 39.0–52.0)
Hemoglobin: 8.9 g/dL — ABNORMAL LOW (ref 13.0–17.0)
MCH: 32 pg (ref 26.0–34.0)
MCHC: 34.1 g/dL (ref 30.0–36.0)
MCV: 93.9 fL (ref 80.0–100.0)
Platelets: 183 10*3/uL (ref 150–400)
RBC: 2.78 MIL/uL — ABNORMAL LOW (ref 4.22–5.81)
RDW: 14.3 % (ref 11.5–15.5)
WBC: 6.2 10*3/uL (ref 4.0–10.5)
nRBC: 0 % (ref 0.0–0.2)

## 2020-12-02 LAB — BASIC METABOLIC PANEL
Anion gap: 5 (ref 5–15)
BUN: 9 mg/dL (ref 8–23)
CO2: 27 mmol/L (ref 22–32)
Calcium: 8 mg/dL — ABNORMAL LOW (ref 8.9–10.3)
Chloride: 98 mmol/L (ref 98–111)
Creatinine, Ser: 0.68 mg/dL (ref 0.61–1.24)
GFR, Estimated: >60 mL/min (ref >60)
Glucose, Bld: 115 mg/dL — ABNORMAL HIGH (ref 70–99)
Potassium: 3.5 mmol/L (ref 3.5–5.1)
Sodium: 130 mmol/L — ABNORMAL LOW (ref 135–145)

## 2020-12-02 LAB — PROTIME-INR
INR: 3.8 — ABNORMAL HIGH (ref 0.8–1.2)
Prothrombin Time: 37.3 seconds — ABNORMAL HIGH (ref 11.4–15.2)

## 2020-12-02 LAB — PREPARE RBC (CROSSMATCH)

## 2020-12-02 MED ORDER — OXYCODONE-ACETAMINOPHEN 5-325 MG PO TABS
1.0000 | ORAL_TABLET | Freq: Four times a day (QID) | ORAL | Status: DC | PRN
  Administered 2020-12-03 – 2020-12-04 (×3): 1 via ORAL
  Filled 2020-12-02 (×4): qty 1

## 2020-12-02 MED ORDER — AMIODARONE HCL IN DEXTROSE 360-4.14 MG/200ML-% IV SOLN: 30.0000 mg/h | INTRAVENOUS | Status: DC

## 2020-12-02 MED ORDER — ACETAMINOPHEN 325 MG PO TABS: 325.0000 mg | ORAL_TABLET | Freq: Four times a day (QID) | ORAL | Status: DC | PRN

## 2020-12-02 MED ORDER — METOPROLOL TARTRATE 25 MG PO TABS
25.0000 mg | ORAL_TABLET | Freq: Two times a day (BID) | ORAL | Status: DC
  Administered 2020-12-02 (×2): 25 mg via ORAL
  Filled 2020-12-02 (×2): qty 1

## 2020-12-02 MED ORDER — AMIODARONE LOAD VIA INFUSION: 150.0000 mg | Freq: Once | INTRAVENOUS | Status: DC

## 2020-12-02 MED ORDER — AMIODARONE HCL IN DEXTROSE 360-4.14 MG/200ML-% IV SOLN: 60.0000 mg/h | INTRAVENOUS | Status: DC

## 2020-12-02 MED ORDER — SODIUM CHLORIDE 0.9% IV SOLUTION: Freq: Once | INTRAVENOUS | Status: AC

## 2020-12-02 NOTE — Progress Notes (Signed)
Patient ID: ARVIL UTZ, male   DOB: 11/01/1940, 80 y.o.   MRN: [id number]    ORTHOPAEDIC PROGRESS NOTE  s/p right total knee on 11/27/2000  SUBJECTIVE: Patient admitted Friday with afib and rapid ventricular response.  Feels better today. Still not much of an appetite. Worked with therapy today.    OBJECTIVE: PE: General: NAD, sitting up in recliner RLE: Dressing CDI, AAROM -10-90, intact EHL/TA/GSC, endorses distal sensation, warm well perfused foot   Vitals:   12/01/20 2006 12/02/20 0412  BP: 93/68 122/89  Pulse: 68 89  Resp: 18 16  Temp: 98.2 F (36.8 C) (!) 97.5 F (36.4 C)  SpO2: 94% 94%     ASSESSMENT: ERICSON NAFZIGER is a 80 y.o. male  - s/p: Right total knee arthroplasty on 11/27/20, POD#5  PLAN: Weightbearing: WBAT RLE Insicional and dressing care: Dressings left intact until follow-up Orthopedic device(s): None Showering: not yet VTE prophylaxis:  warfarin   indefinitely due to afib Pain control: pain under acceptable control.  Patient had difficulty tolerating meds due to over sedation Follow - up plan:  will order CPM 0-90 degrees for 2 hours TID while in the hospital.  Will need zero degree knee foam for when not in CPM.  Up to chair lots and daily physical therapy since he is so acutely post op Contact information:   After hours and holidays please check [email or web address] for group call information for [facility]     Noemi Chapel, PA-C 12/02/2020

## 2020-12-02 NOTE — Progress Notes (Signed)
HD#2 Subjective:  O/N Events: No acute events overnight.   Patient seen at bedside, accompanied by his wife, this AM. Patient states that he is doing well. He does state that he is tolerating exercising his RLE. He did mention that his line was leaking yesterday and there was a prolonged interval before the issue was addressed. He states that he does not have much of an appetite, but will start drinking Ensures with snacks.   Objective:  Vital signs in last 24 hours: Vitals:   12/01/20 0900 12/01/20 1406 12/01/20 2006 12/02/20 0412  BP:  (!) 111/57 93/68 122/89  Pulse: (!) 103 100 68 89  Resp:  15 18 16   Temp:  98.4 F (36.9 C) 98.2 F (36.8 C) (!) 97.5 F (36.4 C)  TempSrc:  Oral Oral Oral  SpO2:  97% 94% 94%  Weight:      Height:       Supplemental O2: Room Air SpO2: 94 %   Physical Exam:  Physical Exam Constitutional:      General: He is not in acute distress.    Appearance: Normal appearance. He is not ill-appearing or diaphoretic.     Comments: NAD, answers questions appropriately  Cardiovascular:     Rate and Rhythm: Tachycardia present. Rhythm irregular.     Heart sounds: No murmur heard.   No gallop.     Comments: Irregularly irregular Pulmonary:     Effort: Pulmonary effort is normal.     Breath sounds: Normal breath sounds. No wheezing, rhonchi or rales.  Abdominal:     General: Bowel sounds are normal.     Palpations: Abdomen is soft.     Tenderness: There is no abdominal tenderness.  Musculoskeletal:     Right lower leg: No edema.     Left lower leg: No edema.  Neurological:     Mental Status: He is alert.      Filed Weights   11/30/20 1303 11/30/20 2158  Weight: 75.3 kg 81.5 kg     Intake/Output Summary (Last 24 hours) at 12/02/2020 1962 Last data filed at 12/02/2020 2297 Gross per 24 hour  Intake --  Output 700 ml  Net -700 ml   Net IO Since Admission: -2,050 mL [12/02/20 0628]  Pertinent Labs: CBC Latest Ref Rng & Units 12/02/2020  12/01/2020 11/30/2020  WBC 4.0 - 10.5 K/uL 6.2 6.6 -  Hemoglobin 13.0 - 17.0 g/dL 8.9(L) 10.3(L) 10.2(L)  Hematocrit 39.0 - 52.0 % 26.1(L) 29.3(L) 30.0(L)  Platelets 150 - 400 K/uL 183 190 -    CMP Latest Ref Rng & Units 12/02/2020 12/01/2020 11/30/2020  Glucose 70 - 99 mg/dL 115(H) 157(H) 103(H)  BUN 8 - 23 mg/dL 9 9 16   Creatinine 0.61 - 1.24 mg/dL 0.68 0.80 0.70  Sodium 135 - 145 mmol/L 130(L) 129(L) 125(L)  Potassium 3.5 - 5.1 mmol/L 3.5 4.0 4.1  Chloride 98 - 111 mmol/L 98 97(L) 91(L)  CO2 22 - 32 mmol/L 27 28 -  Calcium 8.9 - 10.3 mg/dL 8.0(L) 8.4(L) -  Total Protein 6.5 - 8.1 g/dL - - -  Total Bilirubin 0.3 - 1.2 mg/dL - - -  Alkaline Phos 38 - 126 U/L - - -  AST 15 - 41 U/L - - -  ALT 0 - 44 U/L - - -     Assessment/Plan:   Principal Problem:   Atrial fibrillation with RVR (HCC) Active Problems:   Long term current use of anticoagulant   S/P total knee  arthroplasty, right   Hyponatremia   Patient Summary: [name] [name] is a 80 y.o. with a PMH of paroxysmal a. Fib on coumadin, hypertension, hyperlipidemia, anxiety, presenting with weakness after a total knee replacement done on 11/27/20  Paroxysmal  Atrial Fibrillation with RVR Patient continues to Dilt drip O/N, appreciate cardiology's recommendations. Restarted warfarin 7/9.  - Warfarin per pharmacy - continue diltiazem drip, we appreciate cardiologies recommendations.   Hypovolemic Hyponatremia; improving Sodium improved to 129 in the context of poor PO intake following surgery. Continues to have a lack of appetite. Discussed the importance of eating and following his diet. He will start drinking ensures at meals.  - daily BMP   Recent Total Knee Replacement Surgery on 7/5, knee appears swollen and painful on exam; swelling improved 7/9.  - Continue PT - tylenol 1g Q6 PRN, oxycodone 5 mg Q8 PRN, tizanidine 2 mg  Every 8 hours as needed  Hypertension -holding both home losartan (25 mg every other day) and  propranolol 20 mg TID   Prior History of Contrast Reaction - Premedicating with prednisone and benadryl 50 mg   Nausea -zofran 4mg  Q8 PRN   Anemia Con - Hgb 8.9 -transfuse <7   Anxiety -xanax 0.5 mg TID  -continue home fluoxetine 40 mg daily   Constipation -miralax 17g BID, senna 2x daily   Prior to Admission Living Arrangement: home Anticipated Discharge Location: home Barriers to Discharge: medical stabilization of atrial fibrillation Dispo: Anticipated discharge in 2-3 days pending medical stabilization  Maudie Mercury, MD IMTS, PGY-3 Pager: [phone] 12/02/2020,9:46 AM  Please contact the on call pager after 5 pm and on weekends at [phone].

## 2020-12-02 NOTE — Progress Notes (Signed)
Physical Therapy Treatment Patient Details Name: [name] [name] MRN: [id number] DOB: [birth year] Today's Date: 12/02/2020    History of Present Illness 80 yo male presents to [facility] on 7/8 with generalized weakness, fatigue, tachycardia in afib rhythm. Pt with recent R TKR on 7/5, d/c from acute setting 7/6. PMH includes L TKA, right rotator cuff surgery, depression, DJD, tinnitus, paroxysmal afib, HTN, anxiety, pacemaker.    PT Comments    Pt attempting to get OOB on arrival for toileting struggling with management of trunk during transfer. Pt educated for sequence, hand placement to aid transfers. Pt with Afib maintained throughout session with rate fluctuating from 115-172 with rate 120-150 most consistent. Pt OOB to [facility] for BM with limited gait only to chair due to HR. Pt educated for and performed TKA HEP with education for not resting in flexion and pillow roll placed under ankle at rest. Pt educated for RN to call ortho tech for CPM use with return to bed.   HR at rest end of session 125    Follow Up Recommendations  Home health PT;Supervision for mobility/OOB     Equipment Recommendations  None recommended by PT    Recommendations for Other Services       Precautions / Restrictions Precautions Precautions: Knee;Fall;Other (comment) Precaution Comments: watch HR Restrictions RLE Weight Bearing: Weight bearing as tolerated    Mobility  Bed Mobility Overal bed mobility: Needs Assistance Bed Mobility: Rolling;Sidelying to Sit Rolling: Min assist Sidelying to sit: Min assist       General bed mobility comments: cues for sequence with assist to rotate pelvis and trunk as well as elevation of trunk from surface    Transfers Overall transfer level: Needs assistance   Transfers: Sit to/from Stand Sit to Stand: Min assist Stand pivot transfers: Min assist       General transfer comment: cues for hand placement with assist to rise from bed and BSC with pivot Bed to BSC  using RW  Ambulation/Gait Ambulation/Gait assistance: Min guard Gait Distance (Feet): 10 Feet Assistive device: Rolling walker (2 wheeled) Gait Pattern/deviations: Step-to pattern;Decreased stride length;Trunk flexed   Gait velocity interpretation: <1.8 ft/sec, indicate of risk for recurrent falls General Gait Details: cues for posture, safety and direction. Gait limited by Afib with HR 130-150 with a brief spike to 172 and HR 128 seated   Stairs             Wheelchair Mobility    Modified Rankin (Stroke Patients Only)       Balance Overall balance assessment: Needs assistance   Sitting balance-Leahy Scale: Good     Standing balance support: Bilateral upper extremity supported;During functional activity Standing balance-Leahy Scale: Poor Standing balance comment: reliant on external support                            Cognition Arousal/Alertness: Awake/alert Behavior During Therapy: WFL for tasks assessed/performed Overall Cognitive Status: Within Functional Limits for tasks assessed                                        Exercises Total Joint Exercises Long Arc Quad: Right;Seated;20 reps;AROM Goniometric ROM: visually grossly 10-90 Marching in Standing: AAROM;Right;Seated;20 reps    General Comments        Pertinent Vitals/Pain Pain Score: 3  Pain Location: Rt knee with extension Pain Descriptors /  Indicators: Aching;Sore Pain Intervention(s): Limited activity within patient's tolerance;Monitored during session;Repositioned    Home Living                      Prior Function            PT Goals (current goals can now be found in the care plan section) Progress towards PT goals: Progressing toward goals    Frequency    Min 5X/week      PT Plan Current plan remains appropriate    Co-evaluation              AM-PAC PT "6 Clicks" Mobility   Outcome Measure  Help needed turning from your back to  your side while in a flat bed without using bedrails?: A Little Help needed moving from lying on your back to sitting on the side of a flat bed without using bedrails?: A Little Help needed moving to and from a bed to a chair (including a wheelchair)?: A Little Help needed standing up from a chair using your arms (e.g., wheelchair or bedside chair)?: A Little Help needed to walk in hospital room?: A Little Help needed climbing 3-5 steps with a railing? : A Lot 6 Click Score: 17    End of Session   Activity Tolerance: Patient tolerated treatment well Patient left: in chair;with call bell/phone within reach;with family/visitor present Nurse Communication: Mobility status PT Visit Diagnosis: Other abnormalities of gait and mobility (R26.89);Difficulty in walking, not elsewhere classified (R26.2)     Time: 6578-4696 PT Time Calculation (min) (ACUTE ONLY): 35 min  Charges:  $Therapeutic Exercise: 8-22 mins $Therapeutic Activity: 8-22 mins                     Hartleigh Edmonston P, PT Acute Rehabilitation Services Pager: [phone] Office: [phone]    Kemberly Taves B Annika Selke 12/02/2020, 10:51 AM

## 2020-12-02 NOTE — Progress Notes (Signed)
Progress Note  Patient Name: [name] [name] Date of Encounter: 12/02/2020  [facility] Cardiologist: Cristopher Peru, MD    Subjective   80 year old gentleman with a history of paroxysmal atrial fibrillation with catheter ablation, sinus node dysfunction and status post plate placement of pacemaker.  We are asked to see him yesterday for further evaluation of atrial fibrillation.  He has a pacemaker and typically has a very low atrial fibrillation burden.  He recently had knee surgery and I suspect that the stress due to the surgery, anesthesia caused him to go into persistent atrial fibrillation.  His main complaint upon presentation to the hospital with generalized weakness and inability to sit up in bed.  He had a CT of the chest which was negative for pulmonary embolus.  He remains uncomfortable lying in bed.  His heart rate remains elevated.  We will add metoprolol today.  Inpatient Medications    Scheduled Meds:  acetaminophen  1,000 mg Oral Q8H   feeding supplement  237 mL Oral BID BM   FLUoxetine  40 mg Oral Daily   mouth rinse  15 mL Mouth Rinse BID   multivitamin with minerals  1 tablet Oral Daily   polyethylene glycol  17 g Oral BID   senna-docusate  2 tablet Oral BID   Warfarin - Pharmacist Dosing Inpatient   Does not apply q1600   Continuous Infusions:  diltiazem (CARDIZEM) infusion 12.5 mg/hr (12/02/20 0957)   PRN Meds: acetaminophen, ALPRAZolam, ondansetron, oxyCODONE, tiZANidine   Vital Signs    Vitals:   12/02/20 0800 12/02/20 0900 12/02/20 1000 12/02/20 1046  BP: 132/85 (!) 142/104 114/69   Pulse: 94 (!) 163 (!) 161 (!) 155  Resp: 18 19 17    Temp:  (!) 97.4 F (36.3 C)    TempSrc:  Oral    SpO2: 90% 94% 92%   Weight:      Height:        Intake/Output Summary (Last 24 hours) at 12/02/2020 1134 Last data filed at 12/02/2020 0512 Gross per 24 hour  Intake --  Output 500 ml  Net -500 ml   Last 3 Weights 11/30/2020 11/30/2020 11/27/2020  Weight (lbs)  179 lb 10.8 oz 166 lb 166 lb 0.1 oz  Weight (kg) 81.5 kg 75.297 kg 75.3 kg  Some encounter information is confidential and restricted. Go to Review Flowsheets activity to see all data.      Telemetry    Atrial fibrillation with a heart rate in the 120s to 130s- Personally Reviewed  ECG     - Personally Reviewed  Physical Exam   GEN: Elderly gentleman.  He appears to be somewhat weak.  Mildly to moderately obese. Neck: No JVD Cardiac: Irregularly irregular. Respiratory: Clear to auscultation bilaterally. GI: Soft, nontender, non-distended  MS: No edema; status post knee surgery Neuro:  Nonfocal  Psych: Normal affect   Labs    High Sensitivity Troponin:   Recent Labs  Lab 11/30/20 1407 11/30/20 1524  TROPONINIHS 16 13      Chemistry Recent Labs  Lab 11/30/20 1407 11/30/20 1416 12/01/20 0118 12/02/20 0110  NA 125* 125* 129* 130*  K 3.8 4.1 4.0 3.5  CL 91* 91* 97* 98  CO2 26  --  28 27  GLUCOSE 107* 103* 157* 115*  BUN 13 16 9 9   CREATININE 0.78 0.70 0.80 0.68  CALCIUM 8.5*  --  8.4* 8.0*  PROT 5.8*  --   --   --   ALBUMIN 3.0*  --   --   --  AST 31  --   --   --   ALT 17  --   --   --   ALKPHOS 50  --   --   --   BILITOT 1.3*  --   --   --   GFRNONAA >60  --  >60 >60  ANIONGAP 8  --  4* 5     Hematology Recent Labs  Lab 11/30/20 1407 11/30/20 1416 12/01/20 0118 12/02/20 0110  WBC 9.8  --  6.6 6.2  RBC 3.56*  --  3.18* 2.78*  HGB 11.5* 10.2* 10.3* 8.9*  HCT 32.9* 30.0* 29.3* 26.1*  MCV 92.4  --  92.1 93.9  MCH 32.3  --  32.4 32.0  MCHC 35.0  --  35.2 34.1  RDW 14.4  --  14.2 14.3  PLT 184  --  190 183    BNP Recent Labs  Lab 11/30/20 1407  BNP 205.6*     DDimer  Recent Labs  Lab 11/30/20 1643  DDIMER 1.65*     Radiology    CT Angio Chest PE W and/or Wo Contrast  Result Date: 11/30/2020 CLINICAL DATA:  Shortness of breath. Right knee pain surgery this past week. Off Coumadin dental yesterday. EXAM: CT ANGIOGRAPHY CHEST WITH  CONTRAST TECHNIQUE: Multidetector CT imaging of the chest was performed using the standard protocol during bolus administration of intravenous contrast. Multiplanar CT image reconstructions and MIPs were obtained to evaluate the vascular anatomy. CONTRAST:  64mL OMNIPAQUE IOHEXOL 350 MG/ML SOLN COMPARISON:  CT chest 05/03/2005 FINDINGS: Cardiovascular: Left chest wall 2 lead cardiac pacemaker. Normal heart size. No significant pericardial effusion. The thoracic aorta is normal in caliber. No atherosclerotic plaque of the thoracic aorta. No coronary artery calcifications. The main pulmonary artery is normal in caliber. No central pulmonary embolus. Mediastinum/Nodes: No enlarged mediastinal, hilar, or axillary lymph nodes. Thyroid gland, trachea, and esophagus demonstrate no significant findings. Lungs/Pleura: Expiratory phase of respiration. Elevated right hemidiaphragm. Linear atelectasis within the right middle lobe. No pulmonary nodule. No pulmonary mass. Peribronchovascular ground-glass airspace opacity within the right lung consistent with atelectasis. Trace left pleural effusion. No right pleural effusion. No pneumothorax. Upper Abdomen: Status post cholecystectomy.  No acute abnormality. Musculoskeletal: No chest wall abnormality. No suspicious lytic or blastic osseous lesions. No acute displaced fracture. Multilevel degenerative changes of the spine. Review of the MIP images confirms the above findings. IMPRESSION: 1. No pulmonary embolus. 2. Trace left pleural effusion. 3. Aortic Atherosclerosis (ICD10-I70.0). Electronically Signed   By: Iven Finn M.D.   On: 11/30/2020 17:44   DG Chest Portable 1 View  Result Date: 11/30/2020 CLINICAL DATA:  Palpitations and weakness EXAM: PORTABLE CHEST 1 VIEW COMPARISON:  04/29/2018 FINDINGS: Pacer with leads at right atrium and right ventricle. No lead discontinuity. Midline trachea. Reverse apical lordotic positioning. Normal heart size for level of  inspiration. Atherosclerosis in the transverse aorta. Moderate right hemidiaphragm elevation No pleural effusion or pneumothorax. Low lung volumes, accentuating pulmonary interstitium. Right midlung and bibasilar scarring. No congestive failure. No lobar consolidation. Right upper quadrant surgical clips. IMPRESSION: No acute cardiopulmonary disease. Similar appearance of cardiomegaly with bibasilar scarring and right hemidiaphragm elevation. Aortic Atherosclerosis (ICD10-I70.0). Electronically Signed   By: Abigail Miyamoto M.D.   On: 11/30/2020 14:08   ECHOCARDIOGRAM COMPLETE  Result Date: 12/01/2020    ECHOCARDIOGRAM REPORT   Patient Name:   [name] [name] Date of Exam: 12/01/2020 Medical Rec #:  [id number]       Height:  66.0 in Accession #:    [id number]      Weight:       179.7 lb Date of Birth:  [birth year]      BSA:          1.911 m Patient Age:    61 years        BP:           122/72 mmHg Patient Gender: M               HR:           92 bpm. Exam Location:  Inpatient Procedure: 2D Echo, Cardiac Doppler and Color Doppler Indications:    Atrial fibrillation I48.91  History:        Patient has prior history of Echocardiogram examinations, most                 recent 12/01/2011. Ablation. Went into atrial fibrillation post                 knee surgery. General weakness. Low sodium.  Sonographer:    Merrie Roof RDCS Referring Phys: [id number] [place]  1. Left ventricular ejection fraction, by estimation, is 65 to 70%. The left ventricle has normal function. The left ventricle has no regional wall motion abnormalities. There is mild concentric left ventricular hypertrophy. Left ventricular diastolic parameters are indeterminate.  2. Right ventricular systolic function is normal. The right ventricular size is normal. There is normal pulmonary artery systolic pressure.  3. Left atrial size was moderately dilated.  4. The mitral valve is normal in structure. Trivial mitral valve regurgitation.  No evidence of mitral stenosis.  5. The aortic valve is tricuspid. There is mild thickening of the aortic valve. Aortic valve regurgitation is not visualized. Comparison(s): A prior study was performed on 12/01/2011. No significant change from prior study. Prior images reviewed side by side. FINDINGS  Left Ventricle: Left ventricular ejection fraction, by estimation, is 65 to 70%. The left ventricle has normal function. The left ventricle has no regional wall motion abnormalities. The left ventricular internal cavity size was normal in size. There is  mild concentric left ventricular hypertrophy. Left ventricular diastolic parameters are indeterminate. Right Ventricle: The right ventricular size is normal. No increase in right ventricular wall thickness. Right ventricular systolic function is normal. There is normal pulmonary artery systolic pressure. The tricuspid regurgitant velocity is 2.74 m/s, and  with an assumed right atrial pressure of 3 mmHg, the estimated right ventricular systolic pressure is 10.2 mmHg. Left Atrium: Left atrial size was moderately dilated. Right Atrium: Right atrial size was normal in size. Pericardium: There is no evidence of pericardial effusion. Mitral Valve: The mitral valve is normal in structure. Trivial mitral valve regurgitation. No evidence of mitral valve stenosis. Tricuspid Valve: The tricuspid valve is normal in structure. Tricuspid valve regurgitation is trivial. Aortic Valve: The aortic valve is tricuspid. There is mild thickening of the aortic valve. There is mild aortic valve annular calcification. Aortic valve regurgitation is not visualized. Aortic valve mean gradient measures 4.0 mmHg. Aortic valve peak gradient measures 9.1 mmHg. Aortic valve area, by VTI measures 2.90 cm. Pulmonic Valve: The pulmonic valve was not well visualized. Pulmonic valve regurgitation is not visualized. No evidence of pulmonic stenosis. Aorta: The aortic root is normal in size and structure.  IAS/Shunts: The atrial septum is grossly normal. Additional Comments: A device lead is visualized.  LEFT VENTRICLE PLAX 2D LVIDd:  4.00 cm LVIDs:         2.90 cm LV PW:         1.30 cm LV IVS:        1.10 cm LVOT diam:     2.10 cm LV SV:         63 LV SV Index:   33 LVOT Area:     3.46 cm  RIGHT VENTRICLE RV Basal diam:  3.10 cm LEFT ATRIUM             Index       RIGHT ATRIUM           Index LA diam:        4.10 cm 2.15 cm/m  RA Area:     16.80 cm LA Vol (A2C):   78.2 ml 40.93 ml/m RA Volume:   44.30 ml  23.18 ml/m LA Vol (A4C):   78.4 ml 41.03 ml/m LA Biplane Vol: 84.4 ml 44.17 ml/m  AORTIC VALVE AV Area (Vmax):    3.12 cm AV Area (Vmean):   3.28 cm AV Area (VTI):     2.90 cm AV Vmax:           151.00 cm/s AV Vmean:          94.100 cm/s AV VTI:            0.216 m AV Peak Grad:      9.1 mmHg AV Mean Grad:      4.0 mmHg LVOT Vmax:         136.00 cm/s LVOT Vmean:        89.200 cm/s LVOT VTI:          0.181 m LVOT/AV VTI ratio: 0.84  AORTA Ao Root diam: 3.10 cm Ao Asc diam:  3.70 cm TRICUSPID VALVE TR Peak grad:   30.0 mmHg TR Vmax:        274.00 cm/s  SHUNTS Systemic VTI:  0.18 m Systemic Diam: 2.10 cm Rudean Haskell MD Electronically signed by Rudean Haskell MD Signature Date/Time: 12/01/2020/2:02:44 PM    Final    VAS [place] LOWER EXTREMITY VENOUS (DVT)  Result Date: 12/01/2020  Lower Venous DVT Study Patient Name:  [name] [name]  Date of Exam:   12/01/2020 Medical Rec #: [id number]        Accession #:    [id number] Date of Birth: Dec 10, 1940       Patient Gender: M Patient Age:   79Y Exam Location:  [facility] Procedure:      VAS [place] LOWER EXTREMITY VENOUS (DVT) Referring Phys: [id number] [place] --------------------------------------------------------------------------------  Indications: Pain, and Swelling.  Comparison Study: no prior Performing Technologist: Archie Patten RVS  Examination Guidelines: A complete evaluation includes B-mode imaging, spectral Doppler,  color Doppler, and power Doppler as needed of all accessible portions of each vessel. Bilateral testing is considered an integral part of a complete examination. Limited examinations for reoccurring indications may be performed as noted. The reflux portion of the exam is performed with the patient in reverse Trendelenburg.  +---------+---------------+---------+-----------+----------+--------------+ RIGHT    CompressibilityPhasicitySpontaneityPropertiesThrombus Aging +---------+---------------+---------+-----------+----------+--------------+ CFV      Full           Yes      Yes                                 +---------+---------------+---------+-----------+----------+--------------+ SFJ      Full                                                        +---------+---------------+---------+-----------+----------+--------------+  FV Prox  Full                                                        +---------+---------------+---------+-----------+----------+--------------+ FV Mid   Full                                                        +---------+---------------+---------+-----------+----------+--------------+ FV DistalFull                                                        +---------+---------------+---------+-----------+----------+--------------+ PFV      Full                                                        +---------+---------------+---------+-----------+----------+--------------+ POP      Full           Yes      Yes                                 +---------+---------------+---------+-----------+----------+--------------+ PTV      Full                                                        +---------+---------------+---------+-----------+----------+--------------+ PERO     Full                                                        +---------+---------------+---------+-----------+----------+--------------+    +---------+---------------+---------+-----------+----------+--------------+ LEFT     CompressibilityPhasicitySpontaneityPropertiesThrombus Aging +---------+---------------+---------+-----------+----------+--------------+ CFV      Full           Yes      Yes                                 +---------+---------------+---------+-----------+----------+--------------+ SFJ      Full                                                        +---------+---------------+---------+-----------+----------+--------------+ FV Prox  Full                                                        +---------+---------------+---------+-----------+----------+--------------+  FV Mid   Full                                                        +---------+---------------+---------+-----------+----------+--------------+ FV DistalFull                                                        +---------+---------------+---------+-----------+----------+--------------+ PFV      Full                                                        +---------+---------------+---------+-----------+----------+--------------+ POP      Full           Yes      Yes                                 +---------+---------------+---------+-----------+----------+--------------+ PTV      Full                                                        +---------+---------------+---------+-----------+----------+--------------+ PERO     Full                                                        +---------+---------------+---------+-----------+----------+--------------+     Summary: BILATERAL: - No evidence of deep vein thrombosis seen in the lower extremities, bilaterally. -No evidence of popliteal cyst, bilaterally.   *See table(s) above for measurements and observations. Electronically signed by Jamelle Haring on 12/01/2020 at 4:40:50 PM.    Final     Cardiac Studies     Patient Profile       Assessment &  Plan    1.  Atrial fibrillation with rapid ventricular response.  He remains in rapid atrial fibrillation.  He typically has a relatively low A. fib burden.  We will add metoprolol 25 mg twice a day.  Continue warfarin.  It is quite possible that he will need a transesophageal echo/cardioversion later this week.  I briefly discussed this procedure with him and his wife.  We could also consider adding amiodarone to help with his atrial fibrillation but my hope is that he will get back in sinus rhythm and continue to have a relatively low A. fib burden after he is out further from his surgical procedure.  2.  Generalized weakness: I doubt that his generalized weakness is from his atrial fibrillation.  Further input per the medical team.      For questions or updates, please contact [facility] Please consult [email or web address] for contact info under        Signed, Mertie Moores, MD  12/02/2020, 11:34 AM

## 2020-12-02 NOTE — Progress Notes (Signed)
Pt HR is now in the 80s still atrial fib but rate much improved with BB.  His dilt is at 5, if HR below 60 will stop dilt.

## 2020-12-02 NOTE — Progress Notes (Signed)
ANTICOAGULATION CONSULT NOTE - Follow Up Consult  Pharmacy Consult for warfarin Indication: atrial fibrillation  Allergies  Allergen Reactions   Iohexol Hives    POST MYELOGRAM.  PT ALSO SAID THIS HAPPENED A YEAR AGO WITH A MYELOGRAM AND WITH ESI'S., Onset Date: [id number]    Meperidine Hcl Other (See Comments)    hallucinations   Demerol Hcl [Meperidine] Other (See Comments)    Hallucinations   Prednisone Other (See Comments)    Redness of skin & jittery    Patient Measurements: Height: 5\' 6"  (167.6 cm) Weight: 81.5 kg (179 lb 10.8 oz) IBW/kg (Calculated) : 63.8  Vital Signs: Temp: 97.9 F (36.6 C) (07/10 1512) Temp Source: Oral (07/10 1512) BP: 113/67 (07/10 1512) Pulse Rate: 78 (07/10 1512)  Labs: Recent Labs    11/30/20 1407 11/30/20 1416 11/30/20 1524 12/01/20 0118 12/02/20 0110  HGB 11.5* 10.2*  --  10.3* 8.9*  HCT 32.9* 30.0*  --  29.3* 26.1*  PLT 184  --   --  190 183  LABPROT 28.1*  --   --  35.6* 37.3*  INR 2.6*  --   --  3.6* 3.8*  CREATININE 0.78 0.70  --  0.80 0.68  TROPONINIHS 16  --  13  --   --     Estimated Creatinine Clearance: 72.6 mL/min (by C-G formula based on SCr of 0.68 mg/dL).   Assessment: [name] [name] is an 27 YOM presenting with weakness and tachycardia with a PMH significant for Afib on chronic anticoagulation with warfarin. Recently had total knee replacement 7/5. He is currently in Afib receiving diltiazem 9.25 mg gtt and metoprolol 25 mg BID. Plans for TEE/echo/cardioversion this week if he does not convert to NSR.   INR today is 3.8 and considered supratherapeutic. Received 2 doses of warfarin 7.5 mg and had decreased PO intake PTA. Still decreased PO intake. Hgb/HCT decreased from 10.3/29.3 yesterday to 8.9/26.1 today. No signs/symptoms of bleeding per RN.   Goal of Therapy:  INR 2-3 Monitor platelets by anticoagulation protocol: Yes   Plan:  Hold warfarin today Daily INR, CBC Monitor Hgb/HCT, PLT Monitor closely for s/sx  of bleeding  Thank you for involving pharmacy in this patient's care.  Elita Quick, PharmD PGY1 Ambulatory Care Pharmacy Resident 12/02/2020 4:01 PM  **Pharmacist phone directory can be found on [email or web address] listed under [place]**

## 2020-12-03 DIAGNOSIS — I4891 Unspecified atrial fibrillation: Secondary | ICD-10-CM | POA: Diagnosis not present

## 2020-12-03 LAB — BASIC METABOLIC PANEL
Anion gap: 6 (ref 5–15)
BUN: 8 mg/dL (ref 8–23)
CO2: 29 mmol/L (ref 22–32)
Calcium: 8.3 mg/dL — ABNORMAL LOW (ref 8.9–10.3)
Chloride: 96 mmol/L — ABNORMAL LOW (ref 98–111)
Creatinine, Ser: 0.65 mg/dL (ref 0.61–1.24)
GFR, Estimated: >60 mL/min (ref >60)
Glucose, Bld: 109 mg/dL — ABNORMAL HIGH (ref 70–99)
Potassium: 3.7 mmol/L (ref 3.5–5.1)
Sodium: 131 mmol/L — ABNORMAL LOW (ref 135–145)

## 2020-12-03 LAB — CBC
HCT: 33.1 % — ABNORMAL LOW (ref 39.0–52.0)
Hemoglobin: 11.5 g/dL — ABNORMAL LOW (ref 13.0–17.0)
MCH: 32.1 pg (ref 26.0–34.0)
MCHC: 34.7 g/dL (ref 30.0–36.0)
MCV: 92.5 fL (ref 80.0–100.0)
Platelets: 210 10*3/uL (ref 150–400)
RBC: 3.58 MIL/uL — ABNORMAL LOW (ref 4.22–5.81)
RDW: 14.3 % (ref 11.5–15.5)
WBC: 6.5 10*3/uL (ref 4.0–10.5)
nRBC: 0 % (ref 0.0–0.2)

## 2020-12-03 LAB — MAGNESIUM: Magnesium: 1.9 mg/dL (ref 1.7–2.4)

## 2020-12-03 LAB — TYPE AND SCREEN
ABO/RH(D): O POS
Antibody Screen: NEGATIVE
Unit division: 0

## 2020-12-03 LAB — BPAM RBC
Blood Product Expiration Date: 202208092359
ISSUE DATE / TIME: 202207101434
Unit Type and Rh: 5100

## 2020-12-03 LAB — PROTIME-INR
INR: 2.6 — ABNORMAL HIGH (ref 0.8–1.2)
Prothrombin Time: 27.5 seconds — ABNORMAL HIGH (ref 11.4–15.2)

## 2020-12-03 MED ORDER — ONDANSETRON 4 MG PO TBDP
4.0000 mg | ORAL_TABLET | Freq: Three times a day (TID) | ORAL | Status: DC | PRN
  Administered 2020-12-03: 4 mg via ORAL
  Filled 2020-12-03 (×2): qty 1

## 2020-12-03 MED ORDER — METOPROLOL TARTRATE 25 MG PO TABS
25.0000 mg | ORAL_TABLET | Freq: Four times a day (QID) | ORAL | Status: DC
  Administered 2020-12-03 – 2020-12-04 (×5): 25 mg via ORAL
  Filled 2020-12-03 (×5): qty 1

## 2020-12-03 MED ORDER — WARFARIN SODIUM 5 MG PO TABS
5.0000 mg | ORAL_TABLET | Freq: Once | ORAL | Status: AC
  Administered 2020-12-03: 5 mg via ORAL
  Filled 2020-12-03: qty 1

## 2020-12-03 MED ORDER — LACTATED RINGERS IV SOLN: INTRAVENOUS | Status: AC

## 2020-12-03 MED ORDER — METOPROLOL TARTRATE 25 MG PO TABS
25.0000 mg | ORAL_TABLET | Freq: Three times a day (TID) | ORAL | Status: DC
  Administered 2020-12-03: 25 mg via ORAL
  Filled 2020-12-03: qty 1

## 2020-12-03 NOTE — Care Management (Signed)
12-03-20 1443 The patient is s/p total knee replacement on 11/27/20 and is currently active with [facility] for Physical Therapy-office is aware that the patient is hospitalized. Patient will need HH PT  resumption orders prior to transitioning home. Patient has a CPM machine, Rolling [place] and a riser on the toilet. No DME needed at this time. No further needs identified at this time.

## 2020-12-03 NOTE — Progress Notes (Signed)
Orthopedic Tech Progress Note Patient Details:  [name] [name] Aug 01, 1940 [id number] Applied CPM to right knee with ice. Maintained 83 degrees flexion from CPM session from earlier in the day.  CPM Right Knee CPM Right Knee: On Right Knee Flexion (Degrees): 83 Right Knee Extension (Degrees): 0 Additional Comments: Maintained 83 degrees from CPM session earlier in the day.  Post Interventions Patient Tolerated: Well Instructions Provided: Care of device  Petra Kuba 12/03/2020, 7:21 PM

## 2020-12-03 NOTE — Progress Notes (Signed)
ANTICOAGULATION CONSULT NOTE - Follow Up Consult  Pharmacy Consult for warfarin Indication: atrial fibrillation  Allergies  Allergen Reactions   Iohexol Hives    POST MYELOGRAM.  PT ALSO SAID THIS HAPPENED A YEAR AGO WITH A MYELOGRAM AND WITH ESI'S., Onset Date: [id number]    Meperidine Hcl Other (See Comments)    hallucinations   Demerol Hcl [Meperidine] Other (See Comments)    Hallucinations   Prednisone Other (See Comments)    Redness of skin & jittery    Patient Measurements: Height: 5\' 6"  (167.6 cm) Weight: 81.5 kg (179 lb 10.8 oz) IBW/kg (Calculated) : 63.8  Vital Signs: Temp: 97.1 F (36.2 C) (07/11 1121) Temp Source: Oral (07/11 1121) BP: 133/76 (07/11 1121) Pulse Rate: 109 (07/11 1121)  Labs: Recent Labs    11/30/20 1407 11/30/20 1416 11/30/20 1524 12/01/20 0118 12/02/20 0110 12/03/20 0229  HGB 11.5*   < >  --  10.3* 8.9* 11.5*  HCT 32.9*   < >  --  29.3* 26.1* 33.1*  PLT 184  --   --  190 183 210  LABPROT 28.1*  --   --  35.6* 37.3* 27.5*  INR 2.6*  --   --  3.6* 3.8* 2.6*  CREATININE 0.78   < >  --  0.80 0.68 0.65  TROPONINIHS 16  --  13  --   --   --    < > = values in this interval not displayed.     Estimated Creatinine Clearance: 72.6 mL/min (by C-G formula based on SCr of 0.65 mg/dL).   Assessment: [name] [name] is an 68 YOM presenting with weakness and tachycardia with a PMH significant for Afib on chronic anticoagulation with warfarin. Recently had total knee replacement 7/5 and DC  Readmitted for Afib Afib RVR on diltiazem drip and metoprolol BI > inc q6h. Plans for TEE/echo/cardioversion this week if he does not convert to NSR.   INR today is 2.6 - at goal - after warfarin held for 2 days.  7/5 &6 post op pt received warfarin 7.5mg  Still decreased PO intake. Hgb/HCT decreased  yesterday but now improved post PRBC    Warfarin dose prior to surgery 2.5mg  daily except 5mg  Sun,Thur  Goal of Therapy:  INR 2-3 Monitor platelets by  anticoagulation protocol: Yes   Plan:  Warfarin 5mg  x1 today  - INR may still be decreasing after hold and may need another boost tomorrow to keep from falling too low if needed DCCV Daily INR, CBC Monitor Hgb/HCT, PLT Monitor closely for s/sx of bleeding   Bonnita Nasuti Pharm.D. CPP, BCPS Clinical Pharmacist [phone] 12/03/2020 1:03 PM    **Pharmacist phone directory can be found on [email or web address] listed under [place]**

## 2020-12-03 NOTE — Progress Notes (Signed)
Physical Therapy Treatment Patient Details Name: [name] [name] MRN: [id number] DOB: [birth year] Today's Date: 12/03/2020    History of Present Illness 80 yo male presents to [facility] on 7/8 with generalized weakness, fatigue, tachycardia in afib rhythm. Pt with recent R TKR on 7/5, d/c from acute setting 7/6. PMH includes L TKA, right rotator cuff surgery, depression, DJD, tinnitus, paroxysmal afib, HTN, anxiety, pacemaker.    PT Comments    Pt requires min assist bed mobility and transfers. Pt declining RLE exercises stating they wear him out too much and put a stress on his heart. HR 99-118 at rest and with minimal activity. Remains in a fib. Pt agreeable to CPM RLE. CPM placed. RN present in room and educated on removal as well as calling ortho tech for 2 additional CPM sessions today.    Follow Up Recommendations  Home health PT;Supervision for mobility/OOB     Equipment Recommendations  None recommended by PT    Recommendations for Other Services       Precautions / Restrictions Precautions Precautions: Knee;Fall;Other (comment) Precaution Comments: watch HR Restrictions RLE Weight Bearing: Weight bearing as tolerated    Mobility  Bed Mobility Overal bed mobility: Needs Assistance Bed Mobility: Supine to Sit;Sit to Supine     Supine to sit: Min assist Sit to supine: Min assist   General bed mobility comments: +rail, cues for sequencing    Transfers Overall transfer level: Needs assistance Equipment used: Rolling walker (2 wheeled) Transfers: Sit to/from [facility] Sit to Stand: Min assist Stand pivot transfers: Min assist       General transfer comment: cues for hand placement and sequencing  Ambulation/Gait                 Stairs             Wheelchair Mobility    Modified Rankin (Stroke Patients Only)       Balance                                            Cognition Arousal/Alertness:  Awake/alert Behavior During Therapy: WFL for tasks assessed/performed Overall Cognitive Status: Within Functional Limits for tasks assessed                                        Exercises      General Comments General comments (skin integrity, edema, etc.): Pt remains in a fib. HR 99-118      Pertinent Vitals/Pain Pain Assessment: Faces Faces Pain Scale: Hurts little more Pain Location: R knee Pain Descriptors / Indicators: Grimacing;Discomfort;Sore Pain Intervention(s): Limited activity within patient's tolerance;Monitored during session;Repositioned    Home Living                      Prior Function            PT Goals (current goals can now be found in the care plan section) Acute Rehab PT Goals Patient Stated Goal: home Progress towards PT goals: Progressing toward goals    Frequency    Min 5X/week      PT Plan Current plan remains appropriate    Co-evaluation              AM-PAC PT "6 Clicks" Mobility  Outcome Measure  Help needed turning from your back to your side while in a flat bed without using bedrails?: A Little Help needed moving from lying on your back to sitting on the side of a flat bed without using bedrails?: A Little Help needed moving to and from a bed to a chair (including a wheelchair)?: A Little Help needed standing up from a chair using your arms (e.g., wheelchair or bedside chair)?: A Little Help needed to walk in hospital room?: A Little Help needed climbing 3-5 steps with a railing? : A Lot 6 Click Score: 17    End of Session Equipment Utilized During Treatment: Gait belt Activity Tolerance: Patient limited by pain;Patient limited by fatigue Patient left: in bed;with call bell/phone within reach;in CPM Nurse Communication: Mobility status PT Visit Diagnosis: Other abnormalities of gait and mobility (R26.89);Difficulty in walking, not elsewhere classified (R26.2) Pain - Right/Left: Right Pain -  part of body: Knee     Time: 1829-9371 PT Time Calculation (min) (ACUTE ONLY): 13 min  Charges:  $Therapeutic Activity: 8-22 mins                     Lorrin Goodell, PT  Office # [phone] Pager [phone]    Lorriane Shire 12/03/2020, 11:43 AM

## 2020-12-03 NOTE — Progress Notes (Signed)
Patient ID: [name] [name], male   DOB: Jul 05, 1940, 80 y.o.   MRN: [id number]    ORTHOPAEDIC PROGRESS NOTE  s/p right total knee on 11/27/2000  SUBJECTIVE: Patient admitted Friday with afib and rapid ventricular response.  Feels better Still not much of an appetite. Has been working with therapy. Pain controlled    OBJECTIVE: PE: General: NAD, sitting up in recliner RLE: Dressing CDI, AAROM -10-90, intact EHL/TA/GSC, endorses distal sensation, warm well perfused foot   Vitals:   12/03/20 1347 12/03/20 1651  BP:  (!) 143/75  Pulse: 80 95  Resp:  18  Temp:  97.8 F (36.6 C)  SpO2:  97%     ASSESSMENT: [name] [name] is a 80 y.o. male  - s/p: Right total knee arthroplasty on 11/27/20, POD#6  PLAN: Weightbearing: WBAT RLE Insicional and dressing care: Dressings left intact until follow-up Orthopedic device(s): None Showering: not yet VTE prophylaxis:  warfarin   indefinitely due to afib Pain control: pain under acceptable control.  Patient had difficulty tolerating meds due to over sedation Follow - up plan:  will order CPM 0-90 degrees for 2 hours TID while in the hospital.  Will need zero degree knee foam for when not in CPM.  Up to chair lots and daily physical therapy since he is so acutely post op Contact information:   After hours and holidays please check [email or web address] for group call information for [facility]     Noemi Chapel, PA-C 12/03/2020

## 2020-12-03 NOTE — Progress Notes (Addendum)
Progress Note  Patient Name: [name] [name] Date of Encounter: 12/03/2020  [facility] Cardiologist: Cristopher Peru, MD   Subjective   Patient is sleeping, easily awakened to voice. He states he has no further chest pain, had it at admission, continue to have intermittent heart palpitation, heart racing sensation, and dizziness. He states he has no appetite for few month and wants to know why. He states his knee pain is controlled. Wife is asking if patient's still needs to be shocked.   Inpatient Medications    Scheduled Meds:  feeding supplement  237 mL Oral BID BM   FLUoxetine  40 mg Oral Daily   mouth rinse  15 mL Mouth Rinse BID   metoprolol tartrate  25 mg Oral TID   multivitamin with minerals  1 tablet Oral Daily   polyethylene glycol  17 g Oral BID   senna-docusate  2 tablet Oral BID   Warfarin - Pharmacist Dosing Inpatient   Does not apply q1600   Continuous Infusions:  diltiazem (CARDIZEM) infusion 5 mg/hr (12/03/20 0544)   PRN Meds: acetaminophen, ALPRAZolam, ondansetron, oxyCODONE-acetaminophen, tiZANidine   Vital Signs    Vitals:   12/02/20 1700 12/02/20 1746 12/02/20 1932 12/03/20 0436  BP: (!) 119/91 107/67 125/66 (!) 151/99  Pulse: 75 69 73 99  Resp: 20 15 18 19   Temp:  (!) 97.5 F (36.4 C) 97.8 F (36.6 C) 98.1 F (36.7 C)  TempSrc:  Oral Oral Oral  SpO2: 97% 94% 94% 97%  Weight:      Height:        Intake/Output Summary (Last 24 hours) at 12/03/2020 0837 Last data filed at 12/03/2020 0437 Gross per 24 hour  Intake 1559.19 ml  Output 780 ml  Net 779.19 ml   Last 3 Weights 11/30/2020 11/30/2020 11/27/2020  Weight (lbs) 179 lb 10.8 oz 166 lb 166 lb 0.1 oz  Weight (kg) 81.5 kg 75.297 kg 75.3 kg  Some encounter information is confidential and restricted. Go to Review Flowsheets activity to see all data.      Telemetry    A fib with ventricular rate of 100-105s this AM - Personally Reviewed  ECG    N/A this AM  - Personally  Reviewed  Physical Exam   GEN: No acute distress.   Neck: No JVD Cardiac: Irregularly irregular, no murmurs, rubs, or gallops.  Respiratory: Clear to auscultation bilaterally. On room air.  GI: Soft, nontender, non-distended  MS: BLE compression stocking in place, R knee incision with dressing in place, no significant edema  Neuro:  Nonfocal  Psych: Normal affect   Labs    High Sensitivity Troponin:   Recent Labs  Lab 11/30/20 1407 11/30/20 1524  TROPONINIHS 16 13      Chemistry Recent Labs  Lab 11/30/20 1407 11/30/20 1416 12/01/20 0118 12/02/20 0110 12/03/20 0229  NA 125*   < > 129* 130* 131*  K 3.8   < > 4.0 3.5 3.7  CL 91*   < > 97* 98 96*  CO2 26  --  28 27 29   GLUCOSE 107*   < > 157* 115* 109*  BUN 13   < > 9 9 8   CREATININE 0.78   < > 0.80 0.68 0.65  CALCIUM 8.5*  --  8.4* 8.0* 8.3*  PROT 5.8*  --   --   --   --   ALBUMIN 3.0*  --   --   --   --   AST 31  --   --   --   --  ALT 17  --   --   --   --   ALKPHOS 50  --   --   --   --   BILITOT 1.3*  --   --   --   --   GFRNONAA >60  --  >60 >60 >60  ANIONGAP 8  --  4* 5 6   < > = values in this interval not displayed.     Hematology Recent Labs  Lab 12/01/20 0118 12/02/20 0110 12/03/20 0229  WBC 6.6 6.2 6.5  RBC 3.18* 2.78* 3.58*  HGB 10.3* 8.9* 11.5*  HCT 29.3* 26.1* 33.1*  MCV 92.1 93.9 92.5  MCH 32.4 32.0 32.1  MCHC 35.2 34.1 34.7  RDW 14.2 14.3 14.3  PLT 190 183 210    BNP Recent Labs  Lab 11/30/20 1407  BNP 205.6*     DDimer  Recent Labs  Lab 11/30/20 1643  DDIMER 1.65*     Radiology    ECHOCARDIOGRAM COMPLETE  Result Date: 12/01/2020    ECHOCARDIOGRAM REPORT   Patient Name:   HARTFORD MAULDEN Date of Exam: 12/01/2020 Medical Rec #:  [id number]       Height:       66.0 in Accession #:    [id number]      Weight:       179.7 lb Date of Birth:  05/30/1940      BSA:          1.911 m Patient Age:    80 years        BP:           122/72 mmHg Patient Gender: M               HR:            92 bpm. Exam Location:  Inpatient Procedure: 2D Echo, Cardiac Doppler and Color Doppler Indications:    Atrial fibrillation I48.91  History:        Patient has prior history of Echocardiogram examinations, most                 recent 12/01/2011. Ablation. Went into atrial fibrillation post                 knee surgery. General weakness. Low sodium.  Sonographer:    Merrie Roof RDCS Referring Phys: [id number] [place]  1. Left ventricular ejection fraction, by estimation, is 65 to 70%. The left ventricle has normal function. The left ventricle has no regional wall motion abnormalities. There is mild concentric left ventricular hypertrophy. Left ventricular diastolic parameters are indeterminate.  2. Right ventricular systolic function is normal. The right ventricular size is normal. There is normal pulmonary artery systolic pressure.  3. Left atrial size was moderately dilated.  4. The mitral valve is normal in structure. Trivial mitral valve regurgitation. No evidence of mitral stenosis.  5. The aortic valve is tricuspid. There is mild thickening of the aortic valve. Aortic valve regurgitation is not visualized. Comparison(s): A prior study was performed on 12/01/2011. No significant change from prior study. Prior images reviewed side by side. FINDINGS  Left Ventricle: Left ventricular ejection fraction, by estimation, is 65 to 70%. The left ventricle has normal function. The left ventricle has no regional wall motion abnormalities. The left ventricular internal cavity size was normal in size. There is  mild concentric left ventricular hypertrophy. Left ventricular diastolic parameters are indeterminate. Right Ventricle: The right ventricular size is normal. No  increase in right ventricular wall thickness. Right ventricular systolic function is normal. There is normal pulmonary artery systolic pressure. The tricuspid regurgitant velocity is 2.74 m/s, and  with an assumed right atrial pressure of  3 mmHg, the estimated right ventricular systolic pressure is 50.9 mmHg. Left Atrium: Left atrial size was moderately dilated. Right Atrium: Right atrial size was normal in size. Pericardium: There is no evidence of pericardial effusion. Mitral Valve: The mitral valve is normal in structure. Trivial mitral valve regurgitation. No evidence of mitral valve stenosis. Tricuspid Valve: The tricuspid valve is normal in structure. Tricuspid valve regurgitation is trivial. Aortic Valve: The aortic valve is tricuspid. There is mild thickening of the aortic valve. There is mild aortic valve annular calcification. Aortic valve regurgitation is not visualized. Aortic valve mean gradient measures 4.0 mmHg. Aortic valve peak gradient measures 9.1 mmHg. Aortic valve area, by VTI measures 2.90 cm. Pulmonic Valve: The pulmonic valve was not well visualized. Pulmonic valve regurgitation is not visualized. No evidence of pulmonic stenosis. Aorta: The aortic root is normal in size and structure. IAS/Shunts: The atrial septum is grossly normal. Additional Comments: A device lead is visualized.  LEFT VENTRICLE PLAX 2D LVIDd:         4.00 cm LVIDs:         2.90 cm LV PW:         1.30 cm LV IVS:        1.10 cm LVOT diam:     2.10 cm LV SV:         63 LV SV Index:   33 LVOT Area:     3.46 cm  RIGHT VENTRICLE RV Basal diam:  3.10 cm LEFT ATRIUM             Index       RIGHT ATRIUM           Index LA diam:        4.10 cm 2.15 cm/m  RA Area:     16.80 cm LA Vol (A2C):   78.2 ml 40.93 ml/m RA Volume:   44.30 ml  23.18 ml/m LA Vol (A4C):   78.4 ml 41.03 ml/m LA Biplane Vol: 84.4 ml 44.17 ml/m  AORTIC VALVE AV Area (Vmax):    3.12 cm AV Area (Vmean):   3.28 cm AV Area (VTI):     2.90 cm AV Vmax:           151.00 cm/s AV Vmean:          94.100 cm/s AV VTI:            0.216 m AV Peak Grad:      9.1 mmHg AV Mean Grad:      4.0 mmHg LVOT Vmax:         136.00 cm/s LVOT Vmean:        89.200 cm/s LVOT VTI:          0.181 m LVOT/AV VTI ratio:  0.84  AORTA Ao Root diam: 3.10 cm Ao Asc diam:  3.70 cm TRICUSPID VALVE TR Peak grad:   30.0 mmHg TR Vmax:        274.00 cm/s  SHUNTS Systemic VTI:  0.18 m Systemic Diam: 2.10 cm Rudean Haskell MD Electronically signed by Rudean Haskell MD Signature Date/Time: 12/01/2020/2:02:44 PM    Final    VAS [place] LOWER EXTREMITY VENOUS (DVT)  Result Date: 12/01/2020  Lower Venous DVT Study Patient Name:  [name]  Date of Exam:  12/01/2020 Medical Rec #: [id number]        Accession #:    [id number] Date of Birth: 1941/03/16       Patient Gender: M Patient Age:   79Y Exam Location:  [facility] Procedure:      VAS [place] LOWER EXTREMITY VENOUS (DVT) Referring Phys: [id number] [place] --------------------------------------------------------------------------------  Indications: Pain, and Swelling.  Comparison Study: no prior Performing Technologist: Archie Patten RVS  Examination Guidelines: A complete evaluation includes B-mode imaging, spectral Doppler, color Doppler, and power Doppler as needed of all accessible portions of each vessel. Bilateral testing is considered an integral part of a complete examination. Limited examinations for reoccurring indications may be performed as noted. The reflux portion of the exam is performed with the patient in reverse Trendelenburg.  +---------+---------------+---------+-----------+----------+--------------+ RIGHT    CompressibilityPhasicitySpontaneityPropertiesThrombus Aging +---------+---------------+---------+-----------+----------+--------------+ CFV      Full           Yes      Yes                                 +---------+---------------+---------+-----------+----------+--------------+ SFJ      Full                                                        +---------+---------------+---------+-----------+----------+--------------+ FV Prox  Full                                                         +---------+---------------+---------+-----------+----------+--------------+ FV Mid   Full                                                        +---------+---------------+---------+-----------+----------+--------------+ FV DistalFull                                                        +---------+---------------+---------+-----------+----------+--------------+ PFV      Full                                                        +---------+---------------+---------+-----------+----------+--------------+ POP      Full           Yes      Yes                                 +---------+---------------+---------+-----------+----------+--------------+ PTV      Full                                                        +---------+---------------+---------+-----------+----------+--------------+  PERO     Full                                                        +---------+---------------+---------+-----------+----------+--------------+   +---------+---------------+---------+-----------+----------+--------------+ LEFT     CompressibilityPhasicitySpontaneityPropertiesThrombus Aging +---------+---------------+---------+-----------+----------+--------------+ CFV      Full           Yes      Yes                                 +---------+---------------+---------+-----------+----------+--------------+ SFJ      Full                                                        +---------+---------------+---------+-----------+----------+--------------+ FV Prox  Full                                                        +---------+---------------+---------+-----------+----------+--------------+ FV Mid   Full                                                        +---------+---------------+---------+-----------+----------+--------------+ FV DistalFull                                                         +---------+---------------+---------+-----------+----------+--------------+ PFV      Full                                                        +---------+---------------+---------+-----------+----------+--------------+ POP      Full           Yes      Yes                                 +---------+---------------+---------+-----------+----------+--------------+ PTV      Full                                                        +---------+---------------+---------+-----------+----------+--------------+ PERO     Full                                                        +---------+---------------+---------+-----------+----------+--------------+  Summary: BILATERAL: - No evidence of deep vein thrombosis seen in the lower extremities, bilaterally. -No evidence of popliteal cyst, bilaterally.   *See table(s) above for measurements and observations. Electronically signed by Jamelle Haring on 12/01/2020 at 4:40:50 PM.    Final     Cardiac Studies   Echo from 12/01/20:   1. Left ventricular ejection fraction, by estimation, is 65 to 70%. The left ventricle has normal function. The left ventricle has no regional wall motion abnormalities. There is mild concentric left ventricular hypertrophy. Left ventricular diastolic parameters are indeterminate.   2. Right ventricular systolic function is normal. The right ventricular size is normal. There is normal pulmonary artery systolic pressure.   3. Left atrial size was moderately dilated.   4. The mitral valve is normal in structure. Trivial mitral valve  regurgitation. No evidence of mitral stenosis.   5. The aortic valve is tricuspid. There is mild thickening of the aortic valve. Aortic valve regurgitation is not visualized.   Comparison(s): A prior study was performed on 12/01/2011. No significant change from prior study. Prior images reviewed side by side.   Medtronic PPM device check 11/23/20:  Scheduled remote reviewed.  Normal  device function.    100 AHR episodes. +OAC per prior reports. Burden 1.9%.  1 New VHR episodes 170's bpm x 4 sec. EGM1:1 SVT. Same as prior reports.  Next remote 91 days.   Patient Profile     80 y.o. male with PMH of Paroxysmal A fib requiring catheter ablation, HTN, sinus node dysfunction, s/p Medtronic PPM implant, cardiology is following for A fib RVR since 12/01/20.   Assessment & Plan    Paroxysmal A fib RVR  - recently discharged on 11/28/20 after right total knee arthroplasty on 11/27/20, returned to ER on 7/822 feeling unwell, fatigue, weak, and elevated HR noted by PT, found to be in A fib RVR at ED  - CTA 7/8 negative for PE  - Echo from 12/01/20 showed EF 65-70%, no RWMA, trivial MR, moderate dilation of LA  - likely exacerbated by recent surgery/pain/dehydration/hyponatremia  - currently on Diltiazem gtt and PO metoprolol 25mg  BID, rate control is improving, currently at low 100s at rest and up to 120s upon waking up this AM, will increase PO metoprolol to 25mg  QID today, wean off Cardizem gtt if able, consider TEE with DCCV if inadequate response to PO meds  - CHA2DS2VASc score 3 due to age and HTN; continued on anticoagulation with coumadin (resumed on 11/28/20 after knee surgery), INR therapeutic, pharmacy to dose   Sinus node dysfunction s/p Medtronic PPM  - 11/23/20 device check with normal device function, 100 AHR episodes, burden 1.9%, 1 new VHR episodes 170s bpm x4 sec, EGM 1:1 SVT. Bettery life 82 month.  - follow up with EP clinic at [place]  HTN - home meds including losartan and propanolol held, likely will resume losartan if BP allows and DC propanolol given initiation of metoprolol    Right knee OA s/p TKA 11/27/20 Right knee post-op pain - ortho following, manage per ortho/ IM  Anemia Anxiety  - managed per IM         For questions or updates, please contact [facility] Please consult [email or web address] for contact info under        Signed, Margie Billet, NP   12/03/2020, 8:37 AM    History and all data above reviewed.  Patient examined.  I agree with the findings as above.  He is having some knee pain  but controlled.  He denies chest pain or SOB. The patient exam reveals BMW:[id number]   ,  Lungs: Few scattered crackles  ,  Abd: Positive bowel sounds, no rebound no guarding, Ext   Right leg edema  .  All available labs, radiology testing, previous records reviewed. Agree with documented assessment and plan.   Atrial fib:  I think we will be able to get rate control with PO meds.  BP will allow up titration of his beta blocker.  I will hope to wean to the IV Cardizem to off tomorrow.  Increase beta blocker to 25 q6 today.    Jeneen Rinks [facility]  10:39 AM  12/03/2020

## 2020-12-03 NOTE — Discharge Summary (Addendum)
Name: [name] [name] MRN: [id number] DOB: March 20, 1941 80 y.o. PCP: Sherald Hess., MD  Date of Admission: 11/30/2020 12:49 PM Date of Discharge: 12/04/2020 Attending Physician: Axel Filler, *  Discharge Diagnosis: 1. Paroxysmal  Atrial Fibrillation with RVR 2. Hypovolemic Hyponatremia 3. Recent Total Knee Replacement 4. Hypertension 5. Anemia  Constipation  Discharge Medications: Allergies as of 12/04/2020       Reactions   Iohexol Hives   POST MYELOGRAM.  PT ALSO SAID THIS HAPPENED A YEAR AGO WITH A MYELOGRAM AND WITH ESI'S., Onset Date: [id number]   Meperidine Hcl Other (See Comments)   hallucinations   Demerol Hcl [meperidine] Other (See Comments)   Hallucinations   Prednisone Other (See Comments)   Redness of skin & jittery        Medication List     STOP taking these medications    losartan 25 MG tablet Commonly known as: COZAAR   Potassium 99 MG Tabs   potassium chloride SA 20 MEQ tablet Commonly known as: KLOR-CON   propranolol 20 MG tablet Commonly known as: INDERAL       TAKE these medications    acetaminophen 500 MG tablet Commonly known as: TYLENOL Take 2 tablets (1,000 mg total) by mouth every 6 (six) hours as needed for mild pain or moderate pain.   Dialyvite Vitamin D 5000 125 MCG (5000 UT) capsule Generic drug: Cholecalciferol Take 5,000 Units by mouth 3 (three) times a week.   docusate sodium 100 MG capsule Commonly known as: COLACE Take 100 mg by mouth 2 (two) times daily.   ferrous gluconate 240 (27 FE) MG tablet Commonly known as: FERGON Take 240 mg by mouth daily.   gabapentin 300 MG capsule Commonly known as: Neurontin Take 1 capsule (300 mg total) by mouth 2 (two) times daily for 14 days. For 2 weeks post op for pain.   methocarbamol 750 MG tablet Commonly known as: Robaxin-750 Take 1 tablet (750 mg total) by mouth every 8 (eight) hours as needed for muscle spasms.   metoprolol succinate 100 MG 24 hr  tablet Commonly known as: Toprol XL Take 1 tablet (100 mg total) by mouth daily. Take with or immediately following a meal.   omeprazole 20 MG tablet Commonly known as: PriLOSEC OTC Take 1 tablet (20 mg total) by mouth daily. For gastric protection   ondansetron 4 MG disintegrating tablet Commonly known as: Zofran ODT Take 1 tablet (4 mg total) by mouth 2 (two) times daily as needed for nausea or vomiting.   tamsulosin 0.4 MG Caps capsule Commonly known as: FLOMAX Take 0.4 mg by mouth daily.   THERATEARS OP Place 1 drop into both eyes at bedtime.   vitamin B-12 1000 MCG tablet Commonly known as: CYANOCOBALAMIN Take 1,000 mcg by mouth daily.   warfarin 5 MG tablet Commonly known as: COUMADIN Take as directed. If you are unsure how to take this medication, talk to your nurse or doctor. Original instructions: Take 0.5 tablets (2.5 mg total) by mouth daily. Take 0.5 tablets (2.5 mg total) by mouth daily except take 1 tablet (5mg ) on Thursdays. Take as directed by the anticoagulation clinic What changed:  how much to take how to take this when to take this additional instructions       ASK your doctor about these medications    ALPRAZolam 0.5 MG tablet Commonly known as: XANAX Take 1 tablet (0.5 mg total) by mouth 3 (three) times daily as needed for anxiety.  FLUoxetine 40 MG capsule Commonly known as: PROZAC Take one tablet each day for depression. (40mg  )   oxyCODONE 5 MG immediate release tablet Commonly known as: Roxicodone Take 1 tablet (5 mg total) by mouth every 8 (eight) hours as needed.        Disposition and follow-up:   [name] [name] was discharged from [facility] in Stable condition.  At the hospital follow up visit please address:  1.  Paroxysmal  Atrial Fibrillation with RVR- patient previously well controlled, excerbated in the setting of stress, follow up in the EP clinic with cardiology  2. Recent Total Knee Replacement-  follow up to ensure good progress with PT/OT and ability to complete ADLs  3. Hypertension- discontinued home losartan and propranolol while inpatient, started on metoprolol 100 daily  4. Coumadin decreased to 2.5 mg daily except 5 mg on thursdays- follow up with coumadin clinic  5.  Labs / imaging needed at time of follow-up: Hemoglobin/hematocrit  6.  Pending labs/ test needing follow-up: None  Follow-up Appointments:   Hospital Course by problem list: 1. Patient Summary: [name] [name] is a 80 y.o. with PMH of paroxysmal a. Fib on coumadin, hypertension, hyperlipidemia, anxiety, presenting with weakness after a total knee replacement done on 11/27/20   Paroxysmal  Atrial Fibrillation with RVR Concern for pulmonary embolism/DVT Patient presented with weakness,  persistent tachycardia and no shortness of breath in the setting of recent surgical an immobilization. CT PE and Vascular ultrasound of LE were done to rule out thrombus. Patient restarted on home warfarin. Acute exacerbation of atrial fibrillation thought to be in part due to the stress from surgery. Patient put on a diltiazem drip and started on metoprolol 25 mg BID, and received fluids until PO intake improved and diltiazem was able to be discontinued.  Metoprolol titrated up to 100 mg daily and propranolol was stopped. Heart rate was stable and patient was deemed ready for discharge.   Prior History of Contrast Reaction Premedicated with benedryl and prednisone to receive CT with contrast. No further interventions required.   Hypovolemic Hyponatremia Thought to be related to poor PO intake due to surgery and nausea. Sodium initially at 125 which improved over the course of hospitalization to 131.  Recent Total Knee Replacement Patient with a prior history of other total knee replacement. Surgery done on 7/5 and patient presented with pain, swelling, and weakness. PT/OT continued to work with the patient while admitted and he  will be discharged with home health to continue therapy.   Hypertension Both home losartan and propranolol were held during hospitalization. Started on metoprolol BID as above.Will continue metoprolol as an outpatient and stop propranolol.   Nausea/Decreased Appetite Patient had poor PO intake after recent surgery. Encouraged intake of ensure and patient increased PO over the course of hospitalization until he was eating just as much as he did prior to surgery.  Anemia Patient persistently tachycardic, transfused 1U pRBC on 7/10 due to tachycardia and weakness with a hemoglobin of 8.9. Post transfusion hemoglobin stable at 11.5   Anxiety Continue home fluoxetine 40 mg daily and 0.5 Xanax TID.   Constipation Miralax 17g BID and senna 2x daily  Discharge Exam:   BP 133/76 (BP Location: Left Arm)   Pulse (!) 109   Temp (!) 97.1 F (36.2 C) (Oral)   Resp 19   Ht 5\' 6"  (1.676 m)   Wt 81.5 kg   SpO2 97%   BMI 29.00 kg/m  Discharge  exam:  Gen: Elderly man, resting comfortably in bed in NAD CV: RRR, no m/r/g Resp: normal WOB on room air MSK: incision c/d/I, trace swelling on the RLE  Pertinent Labs, Studies, and Procedures:   R Knee XR IMPRESSION: Expected appearance of total RIGHT knee arthroplasty. No acute complication seen.    CXR IMPRESSION: No acute cardiopulmonary disease.   Similar appearance of cardiomegaly with bibasilar scarring and right hemidiaphragm elevation.  CT angio Chest PE W and or Wo contrast IMPRESSION: 1. No pulmonary embolus. 2. Trace left pleural effusion. 3. Aortic Atherosclerosis (ICD10-I70.0).  Lab Results  Component Value Date   WBC 6.5 12/03/2020   HGB 11.4 (L) 12/04/2020   HCT 33.5 (L) 12/04/2020   MCV 92.5 12/03/2020   PLT 210 12/03/2020   Lab Results  Component Value Date   NA 131 (L) 12/04/2020   K 3.8 12/04/2020   CO2 31 12/04/2020   GLUCOSE 122 (H) 12/04/2020   BUN 8 12/04/2020   CREATININE 0.68 12/04/2020   CALCIUM  8.3 (L) 12/04/2020   GFRNONAA >60 12/04/2020   GFRAA >60 04/29/2018    Discharge Instructions:   Signed: Scarlett Presto, MD 12/03/2020, 12:42 PM   Pager: [phone]

## 2020-12-03 NOTE — Progress Notes (Signed)
HD#3 Subjective:   Feeling a bit better this morning but still endorsing a lack of appetite. Says that it really wears him out when PT works with him and manipulates his leg. He says that he is afraid to move too much because of the pain. Says that he is able to eat about less than half of what he normally does at home.   Objective:  Vital signs in last 24 hours: Vitals:   12/02/20 1700 12/02/20 1746 12/02/20 1932 12/03/20 0436  BP: (!) 119/91 107/67 125/66 (!) 151/99  Pulse: 75 69 73 99  Resp: 20 15 18 19   Temp:  (!) 97.5 F (36.4 C) 97.8 F (36.6 C) 98.1 F (36.7 C)  TempSrc:  Oral Oral Oral  SpO2: 97% 94% 94% 97%  Weight:      Height:       Supplemental O2: Room Air SpO2: 97 %   Physical Exam:  Constitutional: elderly man lying in bed, in no acute distress Cardiovascular: irregularly irregular rhythm, tachcardic Pulmonary/Chest: normal work of breathing on room air MSK: normal bulk and tone Neurological: alert answering questions appropriately Skin: incision c/d/I, decreased swelling on the RLE   [facility] Weights   11/30/20 1303 11/30/20 2158  Weight: 75.3 kg 81.5 kg     Intake/Output Summary (Last 24 hours) at 12/03/2020 0722 Last data filed at 12/03/2020 0437 Gross per 24 hour  Intake 1559.19 ml  Output 780 ml  Net 779.19 ml   Net IO Since Admission: -1,270.81 mL [12/03/20 0722]  Pertinent Labs: CBC Latest Ref Rng & Units 12/03/2020 12/02/2020 12/01/2020  WBC 4.0 - 10.5 K/uL 6.5 6.2 6.6  Hemoglobin 13.0 - 17.0 g/dL 11.5(L) 8.9(L) 10.3(L)  Hematocrit 39.0 - 52.0 % 33.1(L) 26.1(L) 29.3(L)  Platelets 150 - 400 K/uL 210 183 190    CMP Latest Ref Rng & Units 12/03/2020 12/02/2020 12/01/2020  Glucose 70 - 99 mg/dL 109(H) 115(H) 157(H)  BUN 8 - 23 mg/dL 8 9 9   Creatinine 0.61 - 1.24 mg/dL 0.65 0.68 0.80  Sodium 135 - 145 mmol/L 131(L) 130(L) 129(L)  Potassium 3.5 - 5.1 mmol/L 3.7 3.5 4.0  Chloride 98 - 111 mmol/L 96(L) 98 97(L)  CO2 22 - 32 mmol/L 29 27 28    Calcium 8.9 - 10.3 mg/dL 8.3(L) 8.0(L) 8.4(L)  Total Protein 6.5 - 8.1 g/dL - - -  Total Bilirubin 0.3 - 1.2 mg/dL - - -  Alkaline Phos 38 - 126 U/L - - -  AST 15 - 41 U/L - - -  ALT 0 - 44 U/L - - -    Imaging: No results found.  Assessment/Plan:   Principal Problem:   Atrial fibrillation with RVR (HCC) Active Problems:   Long term current use of anticoagulant   S/P total knee arthroplasty, right   Hyponatremia   Patient Summary: [name] [name] is a 80 y.o. with PMH of paroxysmal a. Fib on coumadin, hypertension, hyperlipidemia, anxiety, presenting with weakness after a total knee replacement done on 11/27/20   Paroxysmal  Atrial Fibrillation with RVR Patient continues to Dilt drip O/N, appreciate cardiology's recommendations. Restarted warfarin 7/9. - Warfarin per pharmacy - on diltiazem drip, we appreciate cardiologies recommendations. -metoprolol 25 mg BID  -LR 140ml/hr 12 hours on 7/11  Hypovolemic Hyponatremia; improving Sodium improved to 131 in the context of poor PO intake following surgery. Continues to have a lack of appetite. Discussed the importance of eating and following his diet.  - continue ensures at meals.  -  daily BMP   Recent Total Knee Replacement Surgery on 7/5, knee appears swollen and painful on exam; swelling improved 7/9. - Continue PT - tylenol 1g Q6 PRN, oxycodone 5 mg Q8 PRN, tizanidine 2 mg  Every 8 hours as needed   Hypertension -holding both home losartan (25 mg every other day) and propranolol 20 mg TID -metoprolol 25 BID added as above   Nausea -zofran 4mg  Q8 PRN   Anemia Patient transfused 1U pRBC on 7/10 due to tachycardia and weakness. Post transfusion hemoglobin stable at 11.5 -Ctm   Anxiety -xanax 0.5 mg TID  -continue home fluoxetine 40 mg daily   Constipation -miralax 17g BID, senna 2x daily  Prior to Admission Living Arrangement: home Anticipated Discharge Location: home with home health Barriers to Discharge:  medical stabilization of atrial fibrillation Dispo: Anticipated discharge to in 1-2 days pending medical stabilization.   [name] Presto, MD Internal Medicine Resident PGY-1 Pager [phone] Please contact the on call pager after 5 pm and on weekends at [phone].

## 2020-12-04 DIAGNOSIS — I4891 Unspecified atrial fibrillation: Secondary | ICD-10-CM | POA: Diagnosis not present

## 2020-12-04 LAB — BASIC METABOLIC PANEL
Anion gap: 4 — ABNORMAL LOW (ref 5–15)
BUN: 8 mg/dL (ref 8–23)
CO2: 31 mmol/L (ref 22–32)
Calcium: 8.3 mg/dL — ABNORMAL LOW (ref 8.9–10.3)
Chloride: 96 mmol/L — ABNORMAL LOW (ref 98–111)
Creatinine, Ser: 0.68 mg/dL (ref 0.61–1.24)
GFR, Estimated: >60 mL/min (ref >60)
Glucose, Bld: 122 mg/dL — ABNORMAL HIGH (ref 70–99)
Potassium: 3.8 mmol/L (ref 3.5–5.1)
Sodium: 131 mmol/L — ABNORMAL LOW (ref 135–145)

## 2020-12-04 LAB — HEMOGLOBIN AND HEMATOCRIT, BLOOD
HCT: 33.5 % — ABNORMAL LOW (ref 39.0–52.0)
Hemoglobin: 11.4 g/dL — ABNORMAL LOW (ref 13.0–17.0)

## 2020-12-04 LAB — PROTIME-INR
INR: 2.3 — ABNORMAL HIGH (ref 0.8–1.2)
Prothrombin Time: 25.6 seconds — ABNORMAL HIGH (ref 11.4–15.2)

## 2020-12-04 MED ORDER — METOPROLOL SUCCINATE ER 100 MG PO TB24: 100.0000 mg | ORAL_TABLET | Freq: Every day | ORAL | 0 refills | Status: DC

## 2020-12-04 MED ORDER — WARFARIN SODIUM 5 MG PO TABS: 2.5000 mg | ORAL_TABLET | Freq: Every day | ORAL | 0 refills | Status: DC

## 2020-12-04 NOTE — Progress Notes (Signed)
ANTICOAGULATION CONSULT NOTE - Follow Up Consult  Pharmacy Consult for warfarin Indication: atrial fibrillation  Allergies  Allergen Reactions   Iohexol Hives    POST MYELOGRAM.  PT ALSO SAID THIS HAPPENED A YEAR AGO WITH A MYELOGRAM AND WITH ESI'S., Onset Date: [id number]    Meperidine Hcl Other (See Comments)    hallucinations   Demerol Hcl [Meperidine] Other (See Comments)    Hallucinations   Prednisone Other (See Comments)    Redness of skin & jittery    Patient Measurements: Height: 5\' 6"  (167.6 cm) Weight: 81.5 kg (179 lb 10.8 oz) IBW/kg (Calculated) : 63.8  Vital Signs: Temp: 97.5 F (36.4 C) (07/12 0808) Temp Source: Oral (07/12 0808) BP: 129/87 (07/12 0808) Pulse Rate: 79 (07/12 1131)  Labs: Recent Labs    12/02/20 0110 12/03/20 0229 12/04/20 0037  HGB 8.9* 11.5* 11.4*  HCT 26.1* 33.1* 33.5*  PLT 183 210  --   LABPROT 37.3* 27.5* 25.6*  INR 3.8* 2.6* 2.3*  CREATININE 0.68 0.65 0.68     Estimated Creatinine Clearance: 72.6 mL/min (by C-G formula based on SCr of 0.68 mg/dL).   Assessment: [name] [name] is an 57 YOM presenting with weakness and tachycardia with a PMH significant for Afib on chronic anticoagulation with warfarin. Recently had total knee replacement 7/5 and DC  Readmitted for Afib Afib RVR on diltiazem drip and metoprolol BI > inc q6h. Plans for TEE/echo/cardioversion this week if he does not convert to NSR.   INR today is 2.3 - at goal - after warfarin held for 2 days.  7/5 &6 post op pt received warfarin 7.5mg . Looking at trend as outpatient, patient has very labile INRs. Discussed with primary team and recommended warfarin 2.5mg  daily at discharge except 5mg  on Thursdays. Outpatient Pharm.D. to adjust further at follow up.    Warfarin dose prior to surgery 2.5mg  daily except 5mg  Sun,Thur.  Goal of Therapy:  INR 2-3 Monitor platelets by anticoagulation protocol: Yes   Plan:  Warfarin 2.5mg  daily except 5mg  on Thursdays Monitor  closely for s/sx of bleeding    **Pharmacist phone directory can be found on [email or web address] listed under [facility]**

## 2020-12-04 NOTE — TOC Transition Note (Signed)
Transition of Care [facility]) - CM/SW Discharge Note   Patient Details  Name: [name] [name] MRN: [id number] Date of Birth: 1940-08-17  [facility] CM/SW Contact:  Zenon Mayo, RN Phone Number: 12/04/2020, 12:30 PM   Clinical Narrative:    NCM spoke with wife on the phone she was in the room with patient,  she states they are active with [facility] and would like to continue. NCM confirmed with Stacie with [facility].    Final next level of care: [facility] Barriers to Discharge: No Barriers Identified   Patient Goals and CMS Choice Patient states their goals for this hospitalization and ongoing recovery are:: return home with [facility] CMS Medicare.gov Compare Post Acute Care list provided to:: Patient Choice offered to / list presented to : Patient  Discharge Placement                       Discharge Plan and Services                                     Social Determinants of Health (SDOH) Interventions     Readmission Risk Interventions No flowsheet data found.

## 2020-12-04 NOTE — Progress Notes (Addendum)
Progress Note  Patient Name: [name] [name] Date of Encounter: 12/04/2020  [facility] Cardiologist: Cristopher Peru, MD   Subjective   Patient states he is doing better. He was able to eat more yesterday. He denied any chest pain, heart palpitation, SOB. He wishes to go home soon. He denied knee pain.   Inpatient Medications    Scheduled Meds:  feeding supplement  237 mL Oral BID BM   FLUoxetine  40 mg Oral Daily   mouth rinse  15 mL Mouth Rinse BID   metoprolol tartrate  25 mg Oral QID   multivitamin with minerals  1 tablet Oral Daily   polyethylene glycol  17 g Oral BID   senna-docusate  2 tablet Oral BID   Warfarin - Pharmacist Dosing Inpatient   Does not apply q1600   Continuous Infusions:   PRN Meds: acetaminophen, ALPRAZolam, ondansetron, oxyCODONE-acetaminophen, tiZANidine   Vital Signs    Vitals:   12/03/20 1651 12/03/20 2250 12/04/20 0658 12/04/20 0808  BP: (!) 143/75 136/87 (!) 143/87 129/87  Pulse: 95 82 67 72  Resp: 18  18 18   Temp: 97.8 F (36.6 C)  97.8 F (36.6 C) (!) 97.5 F (36.4 C)  TempSrc: Oral  Oral Oral  SpO2: 97%  96% 96%  Weight:      Height:        Intake/Output Summary (Last 24 hours) at 12/04/2020 0836 Last data filed at 12/04/2020 0800 Gross per 24 hour  Intake 2059.8 ml  Output 1100 ml  Net 959.8 ml   Last 3 Weights 11/30/2020 11/30/2020 11/27/2020  Weight (lbs) 179 lb 10.8 oz 166 lb 166 lb 0.1 oz  Weight (kg) 81.5 kg 75.297 kg 75.3 kg  Some encounter information is confidential and restricted. Go to Review Flowsheets activity to see all data.      Telemetry    A fib with ventricular rate of 80s this AM, artifacts, occasional PVCs- Personally Reviewed  ECG    N/A this AM  - Personally Reviewed  Physical Exam   GEN: No acute distress.   Neck: No JVD Cardiac: Irregularly irregular, no murmurs, rubs, or gallops.  Respiratory: Clear to auscultation bilaterally. On room air.  GI: Soft, nontender, non-distended  MS: BLE  compression stocking in place, R knee incision with dressing in place, 1+ edema  Neuro:  Nonfocal  Psych: Normal affect   Labs    High Sensitivity Troponin:   Recent Labs  Lab 11/30/20 1407 11/30/20 1524  TROPONINIHS 16 13      Chemistry Recent Labs  Lab 11/30/20 1407 11/30/20 1416 12/02/20 0110 12/03/20 0229 12/04/20 0037  NA 125*   < > 130* 131* 131*  K 3.8   < > 3.5 3.7 3.8  CL 91*   < > 98 96* 96*  CO2 26   < > 27 29 31   GLUCOSE 107*   < > 115* 109* 122*  BUN 13   < > 9 8 8   CREATININE 0.78   < > 0.68 0.65 0.68  CALCIUM 8.5*   < > 8.0* 8.3* 8.3*  PROT 5.8*  --   --   --   --   ALBUMIN 3.0*  --   --   --   --   AST 31  --   --   --   --   ALT 17  --   --   --   --   ALKPHOS 50  --   --   --   --  BILITOT 1.3*  --   --   --   --   GFRNONAA >60   < > >60 >60 >60  ANIONGAP 8   < > 5 6 4*   < > = values in this interval not displayed.     Hematology Recent Labs  Lab 12/01/20 0118 12/02/20 0110 12/03/20 0229 12/04/20 0037  WBC 6.6 6.2 6.5  --   RBC 3.18* 2.78* 3.58*  --   HGB 10.3* 8.9* 11.5* 11.4*  HCT 29.3* 26.1* 33.1* 33.5*  MCV 92.1 93.9 92.5  --   MCH 32.4 32.0 32.1  --   MCHC 35.2 34.1 34.7  --   RDW 14.2 14.3 14.3  --   PLT 190 183 210  --     BNP Recent Labs  Lab 11/30/20 1407  BNP 205.6*     DDimer  Recent Labs  Lab 11/30/20 1643  DDIMER 1.65*     Radiology    No results found.  Cardiac Studies   Echo from 12/01/20:   1. Left ventricular ejection fraction, by estimation, is 65 to 70%. The left ventricle has normal function. The left ventricle has no regional wall motion abnormalities. There is mild concentric left ventricular hypertrophy. Left ventricular diastolic parameters are indeterminate.   2. Right ventricular systolic function is normal. The right ventricular size is normal. There is normal pulmonary artery systolic pressure.   3. Left atrial size was moderately dilated.   4. The mitral valve is normal in structure.  Trivial mitral valve  regurgitation. No evidence of mitral stenosis.   5. The aortic valve is tricuspid. There is mild thickening of the aortic valve. Aortic valve regurgitation is not visualized.   Comparison(s): A prior study was performed on 12/01/2011. No significant change from prior study. Prior images reviewed side by side.   Medtronic PPM device check 11/23/20:  Scheduled remote reviewed.  Normal device function.    100 AHR episodes. +OAC per prior reports. Burden 1.9%.  1 New VHR episodes 170's bpm x 4 sec. EGM1:1 SVT. Same as prior reports.  Next remote 91 days.   Patient Profile     80 y.o. male with PMH of Paroxysmal A fib requiring catheter ablation, HTN, sinus node dysfunction, s/p Medtronic PPM implant, cardiology is following for A fib RVR since 12/01/20.   Assessment & Plan    Paroxysmal A fib RVR  - recently discharged on 11/28/20 after right total knee arthroplasty on 11/27/20, returned to ER on 7/822 feeling unwell, fatigue, weak, and elevated HR noted by PT, found to be in A fib RVR at ED  - CTA 7/8 negative for PE  - Echo from 12/01/20 showed EF 65-70%, no RWMA, trivial MR, moderate dilation of LA  - likely exacerbated by recent surgery/pain/dehydration/hyponatremia  - currently on Diltiazem gtt and PO metoprolol 25mg  QID, rate control improved with rate of 80s today, will STOP Cardizem gtt today, can transition to Metoprolol XL 100mg  at DC  - CHA2DS2VASc score 3 due to age and HTN; continued on anticoagulation with coumadin (resumed on 11/28/20 after knee surgery), INR therapeutic, pharmacy to dose   Sinus node dysfunction s/p Medtronic PPM  - 11/23/20 device check with normal device function, 100 AHR episodes, burden 1.9%, 1 new VHR episodes 170s bpm x4 sec, EGM 1:1 SVT. Bettery life 67 month.  - follow up with EP clinic at DC  HTN - home meds including losartan and propanolol, held, likely will resume losartan if BP requires,  and DC propanolol given initiation of metoprolol     Right knee OA s/p TKA 11/27/20 Right knee post-op pain - ortho following, manage per ortho/ IM  Anemia Anxiety  - managed per IM     For questions or updates, please contact [facility] Please consult [email or web address] for contact info under        Signed, Margie Billet, NP  12/04/2020, 8:36 AM    History and all data above reviewed.  Patient examined.  I agree with the findings as above.  Ambulated and anxious to go home.  Rate is better controlled The patient exam reveals [id number]   ,  Lungs: Clear  ,  Abd: Positive bowel sounds, no rebound no guarding, Ext No edema, right leg bandaged.   .  All available labs, radiology testing, previous records reviewed. Agree with documented assessment and plan.   Atrial fib:  Rate is better.  We will arrange EP follow up.  Warfarin follow up is scheduled.  OK to discharge on 100 mg Toprol XL.   Jeneen Rinks Yecenia Dalgleish  11:03 AM  12/04/2020

## 2020-12-04 NOTE — Discharge Instructions (Addendum)
[name] [name]  You were recently seen at [facility] for weakness and an irregular heart rate. You had a low oral intake so we gave you IV fluids to prevent you from getting dehydrated while you were here. Your heart rate was controlled on IV medications before you were able to transition back to oral meds. Your heart rate has stabilized now and you will be given medications to continue as an outpatient. You should also continue to work with physical therapy with home health as an outpatient. The changes to your medications are as follows.  Discontinue propranolol Start metoprolol succinate 100 mg daily Discontinue losartan  Take 2.5 mg warfarin (coumadin) daily except on Thursdays when you will take 5 mg until your follow up with the coumadin clinic  You should follow up with your primary care doctor in 1-2 weeks as well as continue to follow up with your coumadin clinic, orthopedic surgeon, and your cardiologist.

## 2020-12-04 NOTE — Progress Notes (Signed)
Orthopedic Tech Progress Note Patient Details:  [name] [name] 1941-03-07 [id number]  Spoke with patient this morning 0717 about wanting to get on [place] to apply like asked and patient is being D/C today. Put on fresh ice with wife at bedside. Decided not to get in machine due to him just walking with THERAPY.   Went to apply CPM Patient ID: [name] [name], male   DOB: 10/01/1940, 80 y.o.   MRN: [id number]  Janit Pagan 12/04/2020, 10:08 AM

## 2020-12-04 NOTE — Progress Notes (Signed)
Pt is alert and oriented. Discharge instructions/ AVS given to pt. Wife is at the bedside.

## 2020-12-04 NOTE — Care Management Important Message (Signed)
Important Message  Patient Details  Name: [name] [name] MRN: [id number] Date of Birth: 1940-07-29   Medicare Important Message Given:  Yes     Shelda Altes 12/04/2020, 9:58 AM

## 2020-12-04 NOTE — Progress Notes (Signed)
Physical Therapy Treatment Patient Details Name: [name] [name] MRN: [id number] DOB: 10/05/1940 Today's Date: 12/04/2020    History of Present Illness 80 yo male presents to [facility] on 7/8 with generalized weakness, fatigue, tachycardia in afib rhythm. Pt with recent R TKR on 7/5, d/c from acute setting 7/6. PMH includes L TKA, right rotator cuff surgery, depression, DJD, tinnitus, paroxysmal afib, HTN, anxiety, pacemaker.    PT Comments    Pt feeling better today. HR controled resulting in improved ability with mobility. He required min/min guard assist bed mobility, min guard assist transfers, and min guard assist ambulation 160' with RW. Resting HR in 80s. Max HR 118 during ambulation. Verbally reviewed ascend/descend stairs with L rail. Pt declining practice. Wife reports pt's sons will be able to assist pt into the house. Pt returned to supine at end of session. Ice re-applied R knee.    Follow Up Recommendations  Home health PT;Supervision for mobility/OOB     Equipment Recommendations  None recommended by PT    Recommendations for Other Services       Precautions / Restrictions Precautions Precautions: Knee;Fall;Other (comment) Precaution Comments: watch HR Restrictions Weight Bearing Restrictions: Yes RLE Weight Bearing: Weight bearing as tolerated    Mobility  Bed Mobility Overal bed mobility: Needs Assistance Bed Mobility: Supine to Sit;Sit to Supine Rolling: Modified independent (Device/Increase time)   Supine to sit: Min guard;HOB elevated Sit to supine: Min assist   General bed mobility comments: +rail, cues for sequencing, increased time and effort, assist with RLE back to bed. Pt educated on hooking L foot under R ankle to assist in/out of bed.    Transfers Overall transfer level: Needs assistance Equipment used: Rolling walker (2 wheeled) Transfers: Sit to/from Stand Sit to Stand: Min guard;From elevated surface         General transfer comment: cues  for hand placement, increased time, min guard for safety  Ambulation/Gait Ambulation/Gait assistance: Min guard Gait Distance (Feet): 160 Feet Assistive device: Rolling walker (2 wheeled) Gait Pattern/deviations: Step-through pattern;Decreased stride length;Antalgic Gait velocity: decreased Gait velocity interpretation: <1.31 ft/sec, indicative of household ambulator General Gait Details: cues for posture. Max HR 118   Stairs Stairs:  (verbally reviewed stairs using L rail and HHA on R. Pt declining practice. Pt and wife verbalize understanding.)           Wheelchair Mobility    Modified Rankin (Stroke Patients Only)       Balance Overall balance assessment: Needs assistance Sitting-balance support: No upper extremity supported;Feet supported Sitting balance-Leahy Scale: Good     Standing balance support: Bilateral upper extremity supported;During functional activity Standing balance-Leahy Scale: Poor Standing balance comment: reliant on external support                            Cognition Arousal/Alertness: Awake/alert Behavior During Therapy: WFL for tasks assessed/performed Overall Cognitive Status: Within Functional Limits for tasks assessed                                        Exercises      General Comments General comments (skin integrity, edema, etc.): Resting HR in 80s. Max HR 118 during ambulation.      Pertinent Vitals/Pain Pain Assessment: Faces Faces Pain Scale: Hurts a little bit Pain Location: R knee Pain Descriptors / Indicators: Discomfort Pain Intervention(s): Monitored during  session;Repositioned;Ice applied    Home Living                      Prior Function            PT Goals (current goals can now be found in the care plan section) Acute Rehab PT Goals Patient Stated Goal: home Progress towards PT goals: Progressing toward goals    Frequency    Min 5X/week      PT Plan Current  plan remains appropriate    Co-evaluation              AM-PAC PT "6 Clicks" Mobility   Outcome Measure  Help needed turning from your back to your side while in a flat bed without using bedrails?: A Little Help needed moving from lying on your back to sitting on the side of a flat bed without using bedrails?: A Little Help needed moving to and from a bed to a chair (including a wheelchair)?: A Little Help needed standing up from a chair using your arms (e.g., wheelchair or bedside chair)?: A Little Help needed to walk in hospital room?: A Little Help needed climbing 3-5 steps with a railing? : A Lot 6 Click Score: 17    End of Session Equipment Utilized During Treatment: Gait belt Activity Tolerance: Patient tolerated treatment well Patient left: in bed;with call bell/phone within reach;with family/visitor present Nurse Communication: Mobility status PT Visit Diagnosis: Other abnormalities of gait and mobility (R26.89);Difficulty in walking, not elsewhere classified (R26.2) Pain - Right/Left: Right Pain - part of body: Knee     Time: 4469-5072 PT Time Calculation (min) (ACUTE ONLY): 24 min  Charges:  $Gait Training: 23-37 mins                     Lorrin Goodell, [place]  Office # [phone] Pager [phone]    Lorriane Shire 12/04/2020, 11:47 AM

## 2020-12-12 ENCOUNTER — Other Ambulatory Visit: Payer: Self-pay

## 2020-12-12 ENCOUNTER — Ambulatory Visit (INDEPENDENT_AMBULATORY_CARE_PROVIDER_SITE_OTHER): Payer: Medicare Other | Admitting: *Deleted

## 2020-12-12 DIAGNOSIS — I4891 Unspecified atrial fibrillation: Secondary | ICD-10-CM

## 2020-12-12 DIAGNOSIS — Z7901 Long term (current) use of anticoagulants: Secondary | ICD-10-CM

## 2020-12-12 DIAGNOSIS — Z5181 Encounter for therapeutic drug level monitoring: Secondary | ICD-10-CM

## 2020-12-12 LAB — POCT INR: INR: 3.3 — AB (ref 2.0–3.0)

## 2020-12-12 NOTE — Patient Instructions (Signed)
Description   Hold warfarin tomorrow, then continue to take warfarin 1/2 a tablet daily except for 1 tablet on Thursdays. Recheck INR in 1.5 weeks. Coumadin Clinic [phone].

## 2020-12-18 NOTE — Progress Notes (Signed)
Remote pacemaker transmission.   

## 2020-12-19 ENCOUNTER — Other Ambulatory Visit: Payer: Self-pay

## 2020-12-19 ENCOUNTER — Ambulatory Visit (HOSPITAL_COMMUNITY)
Admission: RE | Admit: 2020-12-19 | Discharge: 2020-12-19 | Disposition: A | Discharge status: Home / Self Care | Payer: Medicare Other | Source: Ambulatory Visit | Attending: Orthopedic Surgery | Admitting: Orthopedic Surgery

## 2020-12-19 ENCOUNTER — Other Ambulatory Visit (HOSPITAL_COMMUNITY): Payer: Self-pay | Admitting: Orthopedic Surgery

## 2020-12-19 DIAGNOSIS — M79604 Pain in right leg: Secondary | ICD-10-CM

## 2020-12-19 DIAGNOSIS — M7989 Other specified soft tissue disorders: Secondary | ICD-10-CM | POA: Diagnosis present

## 2020-12-19 NOTE — Progress Notes (Signed)
Lower extremity venous RT study completed.   Please see CV Proc for preliminary results.   Alizeh Madril, RDMS, RVT  

## 2020-12-20 NOTE — Progress Notes (Signed)
Electrophysiology Office Note Date: 12/21/2020  ID:  AMBER SCHILLIG, DOB 07/01/1940, MRN [id number]  PCP: Sherald Hess., MD Primary Cardiologist: Cristopher Peru, MD Electrophysiologist: Cristopher Peru, MD   CC: Pacemaker follow-up  [name] [name] is a 80 y.o. male seen today for Cristopher Peru, MD for post hospital follow up.    Admitted 7/8 - 12/04/20 with AF with RVR felt to be exacerbated by recent surgery/pain/dehydration/hyponatremia.   Since discharge from hospital the patient reports doing OK. He hasn't felt any palpitations. He continues to have pain from recent total R knee, spoke with surgeon earlier this week.  he denies chest pain, palpitations, dyspnea, PND, orthopnea, nausea, vomiting, dizziness, syncope, edema, weight gain, or early satiety at this time. Tolerating recently added Metoprolol well.  Device History: Medtronic Dual Chamber PPM implanted 2007, gen change 2013 for SSS  Past Medical History:  Diagnosis Date   Anemia    since post op- knee replacement    Anxiety    takes xanax 2 times per day, taken mostly for ringing in ears     Arthritis of knee, right    end-stage   Bronchitis    Bursitis    Complication of anesthesia    irritation in throat postop, makes a-fib worse    Depression    DJD (degenerative joint disease)    OA- "everywhere"   Dysrhythmia    Family history of anesthesia complication    sister with nausia and vomiting   Gout    Heart murmur    HTN (hypertension)    Hyperlipidemia    Migraines    Paroxysmal atrial fibrillation (HCC)    PONV (postoperative nausea and vomiting)    Presence of permanent cardiac pacemaker    Tinnitus    Past Surgical History:  Procedure Laterality Date   APPENDECTOMY     Atrial fibrillation ablation  2009, 12/02/11   PVI by Dr Rayann Heman x 2   ATRIAL FIBRILLATION ABLATION N/A 12/02/2011   Procedure: ATRIAL FIBRILLATION ABLATION;  Surgeon: Thompson Grayer, MD;  Location: [facility] CATH LAB;  Service:  Cardiovascular;  Laterality: N/A;   bilteral thumb joint surgrery     CARDIAC CATHETERIZATION  2005    which revealed 40% stenosis of the mid left anterior descending artery., ablation- 2009   CHOLECYSTECTOMY     JOINT REPLACEMENT     L knee- 12/11,R hip- 1996   left rotator cuff repair     PACEMAKER INSERTION  06/06/2005   Medtronic EnRhythm dual-chamber. For sick sinus syndrome   PERMANENT PACEMAKER GENERATOR CHANGE N/A 03/12/2012   Procedure: PERMANENT PACEMAKER GENERATOR CHANGE;  Surgeon: Evans Lance, MD;  Location: [facility] CATH LAB;  Service: Cardiovascular;  Laterality: N/A;   RHINOPLASTY     right rotator cuff surgery  2006   right total hip arthroplasty     TEE WITHOUT CARDIOVERSION  12/01/2011   Procedure: TRANSESOPHAGEAL ECHOCARDIOGRAM (TEE);  Surgeon: Fay Records, MD;  Location: [place];  Service: Cardiovascular;  Laterality: N/A;  a-fib ablation following day   TONSILLECTOMY     as a child   TOTAL KNEE ARTHROPLASTY Right 11/27/2020   Procedure: TOTAL KNEE ARTHROPLASTY;  Surgeon: Renette Butters, MD;  Location: [facility];  Service: Orthopedics;  Laterality: Right;   TOTAL KNEE REVISION  08/08/2011   Procedure: TOTAL KNEE REVISION;  Surgeon: Kerin Salen, MD;  Location: [place];  Service: Orthopedics;  Laterality: Left;  DEPUY/SIGMA   TRANSESOPHAGEAL ECHOCARDIOGRAM  04/2008  TRANSTHORACIC ECHOCARDIOGRAM  2006,2009   uvulectomy      Current Outpatient Medications  Medication Sig Dispense Refill   acetaminophen (TYLENOL) 500 MG tablet Take 2 tablets (1,000 mg total) by mouth every 6 (six) hours as needed for mild pain or moderate pain. 60 tablet 0   ALPRAZolam (XANAX) 0.5 MG tablet Take 1 tablet (0.5 mg total) by mouth 3 (three) times daily as needed for anxiety. 90 tablet 0   Carboxymethylcellulose Sodium (THERATEARS OP) Place 1 drop into both eyes at bedtime.     Cholecalciferol (DIALYVITE VITAMIN D 5000) 125 MCG (5000 UT) capsule Take 5,000 Units by mouth 3 (three) times a  week.     docusate sodium (COLACE) 100 MG capsule Take 100 mg by mouth 2 (two) times daily.     ferrous gluconate (FERGON) 240 (27 FE) MG tablet Take 240 mg by mouth daily.     FLUoxetine (PROZAC) 40 MG capsule Take one tablet each day for depression. ('40mg'$  ) 30 capsule 0   metoprolol succinate (TOPROL XL) 100 MG 24 hr tablet Take 1 tablet (100 mg total) by mouth daily. Take with or immediately following a meal. 30 tablet 0   omeprazole (PRILOSEC OTC) 20 MG tablet Take 1 tablet (20 mg total) by mouth daily. For gastric protection 30 tablet 0   oxyCODONE (ROXICODONE) 5 MG immediate release tablet Take 1 tablet (5 mg total) by mouth every 8 (eight) hours as needed. 20 tablet 0   tamsulosin (FLOMAX) 0.4 MG CAPS capsule Take 0.4 mg by mouth daily.     vitamin B-12 (CYANOCOBALAMIN) 1000 MCG tablet Take 1,000 mcg by mouth daily.     warfarin (COUMADIN) 5 MG tablet Take 0.5 tablets (2.5 mg total) by mouth daily. Take 0.5 tablets (2.5 mg total) by mouth daily except take 1 tablet ('5mg'$ ) on Thursdays. Take as directed by the anticoagulation clinic 15 tablet 0   gabapentin (NEURONTIN) 300 MG capsule Take 1 capsule (300 mg total) by mouth 2 (two) times daily for 14 days. For 2 weeks post op for pain. 28 capsule 0   methocarbamol (ROBAXIN-750) 750 MG tablet Take 1 tablet (750 mg total) by mouth every 8 (eight) hours as needed for muscle spasms. (Patient not taking: Reported on 12/21/2020) 20 tablet 0   ondansetron (ZOFRAN ODT) 4 MG disintegrating tablet Take 1 tablet (4 mg total) by mouth 2 (two) times daily as needed for nausea or vomiting. (Patient not taking: Reported on 12/21/2020) 10 tablet 0   No current facility-administered medications for this visit.    Allergies:   Iohexol, Meperidine hcl, Demerol hcl [meperidine], and Prednisone   Social History: Social History   Socioeconomic History   Marital status: Married    Spouse name: Not on file   Number of children: 2   Years of education: Not on file    Highest education level: Not on file  Occupational History   Occupation: retired    Fish farm manager: RETIRED  Tobacco Use   Smoking status: Never   Smokeless tobacco: Never  Vaping Use   Vaping Use: Never used  Substance and Sexual Activity   Alcohol use: Not Currently    Comment: rare use   Drug use: No   Sexual activity: Not on file  Other Topics Concern   Not on file  Social History Narrative   Not on file   Social Determinants of Health   Financial Resource Strain: Not on file  Food Insecurity: Not on file  Transportation Needs:  Not on file  Physical Activity: Not on file  Stress: Not on file  Social Connections: Not on file  Intimate Partner Violence: Not on file    Family History: Family History  Problem Relation Age of Onset   Coronary artery disease Mother    Cerebrovascular Disease Mother    Cancer Other    Anesthesia problems Neg Hx    Hypotension Neg Hx    Malignant hyperthermia Neg Hx    Pseudochol deficiency Neg Hx      Review of Systems: All other systems reviewed and are otherwise negative except as noted above.  Physical Exam: Vitals:   12/21/20 1005  BP: 98/60  Pulse: 61  SpO2: 98%  Weight: 169 lb 6.4 oz (76.8 kg)  Height: '5\' 6"'$  (1.676 m)     GEN- The patient is well appearing, alert and oriented x 3 today.   HEENT: normocephalic, atraumatic; sclera clear, conjunctiva pink; hearing intact; oropharynx clear; neck supple  Lungs- Clear to ausculation bilaterally, normal work of breathing.  No wheezes, rales, rhonchi Heart- Regular rate and rhythm, no murmurs, rubs or gallops  GI- soft, non-tender, non-distended, bowel sounds present  Extremities- no clubbing or cyanosis. No edema MS- no significant deformity or atrophy Skin- warm and dry, no rash or lesion; PPM pocket well healed Psych- euthymic mood, full affect Neuro- strength and sensation are intact  PPM Interrogation- reviewed in detail today,  See PACEART report  EKG:  EKG is not  ordered today.  Recent Labs: 11/30/2020: ALT 17; B Natriuretic Peptide 205.6 12/03/2020: Magnesium 1.9; Platelets 210 12/04/2020: BUN 8; Creatinine, Ser 0.68; Hemoglobin 11.4; Potassium 3.8; Sodium 131   Wt Readings from Last 3 Encounters:  12/21/20 169 lb 6.4 oz (76.8 kg)  11/30/20 179 lb 10.8 oz (81.5 kg)  11/27/20 166 lb 0.1 oz (75.3 kg)     Other studies Reviewed: Additional studies/ records that were reviewed today include: Previous EP office notes, Previous remote checks, Most recent labwork.   Assessment and Plan:  1. Sick sinus syndrome s/p Medtronic PPM  Normal PPM function See Pace Art report No changes today  2. PAF Burden 5.7% by device Continue coumadin for CHA2DS2/VASc of at least 3. Coumadin check today.  3. HTN Continue current regimen  4. Total R knee Continues to heal. Pain moderately controlled.  Current medicines are reviewed at length with the patient today.   The patient does not have concerns regarding his medicines.  The following changes were made today:  none  Labs/ tests ordered today include:  Orders Placed This Encounter  Procedures   CBC   Basic metabolic panel    Disposition:   Follow up with Dr. Lovena Le or EP APP in 3 Months. Sooner if issues with worsening AF.   Jacalyn Lefevre, PA-C  12/21/2020 11:07 AM  [facility] [address] [place] [address] [phone] (office) [phone] (fax)

## 2020-12-21 ENCOUNTER — Ambulatory Visit (INDEPENDENT_AMBULATORY_CARE_PROVIDER_SITE_OTHER): Payer: Medicare Other

## 2020-12-21 ENCOUNTER — Ambulatory Visit (INDEPENDENT_AMBULATORY_CARE_PROVIDER_SITE_OTHER): Payer: Medicare Other | Admitting: Student

## 2020-12-21 ENCOUNTER — Encounter: Payer: Self-pay | Admitting: Student

## 2020-12-21 ENCOUNTER — Other Ambulatory Visit: Payer: Self-pay

## 2020-12-21 VITALS — BP 98/60 | HR 61 | Ht 66.0 in | Wt 169.4 lb

## 2020-12-21 DIAGNOSIS — I495 Sick sinus syndrome: Secondary | ICD-10-CM

## 2020-12-21 DIAGNOSIS — Z7901 Long term (current) use of anticoagulants: Secondary | ICD-10-CM

## 2020-12-21 DIAGNOSIS — I4891 Unspecified atrial fibrillation: Secondary | ICD-10-CM

## 2020-12-21 DIAGNOSIS — Z5181 Encounter for therapeutic drug level monitoring: Secondary | ICD-10-CM

## 2020-12-21 LAB — CUP PACEART INCLINIC DEVICE CHECK
Battery Impedance: 1262 Ohm
Battery Remaining Longevity: 54 mo
Battery Voltage: 2.78 V
Brady Statistic AP VP Percent: 0 %
Brady Statistic AP VS Percent: 94 %
Brady Statistic AS VP Percent: 0 %
Brady Statistic AS VS Percent: 6 %
Date Time Interrogation Session: 20220729105737
Implantable Lead Implant Date: 20070112
Implantable Lead Implant Date: 20070112
Implantable Lead Location: 753859
Implantable Lead Location: 753860
Implantable Lead Model: 5076
Implantable Lead Model: 5076
Implantable Pulse Generator Implant Date: 20131018
Lead Channel Impedance Value: 453 Ohm
Lead Channel Impedance Value: 522 Ohm
Lead Channel Pacing Threshold Amplitude: 0.5 V
Lead Channel Pacing Threshold Amplitude: 0.5 V
Lead Channel Pacing Threshold Amplitude: 1 V
Lead Channel Pacing Threshold Amplitude: 1 V
Lead Channel Pacing Threshold Pulse Width: 0.34 ms
Lead Channel Pacing Threshold Pulse Width: 0.34 ms
Lead Channel Pacing Threshold Pulse Width: 0.4 ms
Lead Channel Pacing Threshold Pulse Width: 0.4 ms
Lead Channel Sensing Intrinsic Amplitude: 8 mV
Lead Channel Setting Pacing Amplitude: 2 V
Lead Channel Setting Pacing Amplitude: 2.5 V
Lead Channel Setting Pacing Pulse Width: 0.34 ms
Lead Channel Setting Sensing Sensitivity: 2.8 mV

## 2020-12-21 LAB — POCT INR: INR: 3.6 — AB (ref 2.0–3.0)

## 2020-12-21 NOTE — Patient Instructions (Signed)
-   Hold warfarin tomorrow, then  - START NEW dosage of  warfarin 1/2 a tablet every day - Recheck INR in 2 weeks. Coumadin Clinic [phone].

## 2020-12-21 NOTE — Patient Instructions (Signed)
Medication Instructions:  °Your physician recommends that you continue on your current medications as directed. Please refer to the Current Medication list given to you today. ° °*If you need a refill on your cardiac medications before your next appointment, please call your pharmacy* ° ° °Lab Work: °TODAY: BMET, CBC ° °If you have labs (blood work) drawn today and your tests are completely normal, you will receive your results only by: °MyChart Message (if you have MyChart) OR °A paper copy in the mail °If you have any lab test that is abnormal or we need to change your treatment, we will call you to review the results. ° ° °Follow-Up: °At [facility], you and your health needs are our priority.  As part of our continuing mission to provide you with exceptional heart care, we have created designated Provider Care Teams.  These Care Teams include your primary Cardiologist (physician) and Advanced Practice Providers (APPs -  Physician Assistants and Nurse Practitioners) who all work together to provide you with the care you need, when you need it. ° °Your next appointment:   °As scheduled °

## 2020-12-22 LAB — CBC
Hematocrit: 33.9 % — ABNORMAL LOW (ref 37.5–51.0)
Hemoglobin: 11.7 g/dL — ABNORMAL LOW (ref 13.0–17.7)
MCH: 32.1 pg (ref 26.6–33.0)
MCHC: 34.5 g/dL (ref 31.5–35.7)
MCV: 93 fL (ref 79–97)
Platelets: 302 10*3/uL (ref 150–450)
RBC: 3.65 x10E6/uL — ABNORMAL LOW (ref 4.14–5.80)
RDW: 13 % (ref 11.6–15.4)
WBC: 4.7 10*3/uL (ref 3.4–10.8)

## 2020-12-22 LAB — BASIC METABOLIC PANEL
BUN/Creatinine Ratio: 9 — ABNORMAL LOW (ref 10–24)
BUN: 10 mg/dL (ref 8–27)
CO2: 28 mmol/L (ref 20–29)
Calcium: 8.9 mg/dL (ref 8.6–10.2)
Chloride: 96 mmol/L (ref 96–106)
Creatinine, Ser: 1.14 mg/dL (ref 0.76–1.27)
Glucose: 81 mg/dL (ref 65–99)
Potassium: 4.1 mmol/L (ref 3.5–5.2)
Sodium: 135 mmol/L (ref 134–144)
eGFR: 65 mL/min/{1.73_m2} (ref >59)

## 2020-12-31 ENCOUNTER — Telehealth: Payer: Self-pay | Admitting: Internal Medicine

## 2020-12-31 NOTE — Telephone Encounter (Signed)
Pt c/o medication issue:  1. Name of Medication:  Off potassium, losartan, inderal  Put on metoprolol succinate (TOPROL XL) 100 MG 24 hr tablet  2. How are you currently taking this medication (dosage and times per day)? 1 tablet daily of metoprolol   3. Are you having a reaction (difficulty breathing--STAT)? no  4. What is your medication issue? Patient states he was at the hospital and they changed his medications. He states he is now back in afib and has been for 4-5 days. He states he does have history of afib. He states his BP is okay, but his has bee HR 65, 75 and 90, which is not normal for him. He states it is usually around 80.

## 2020-12-31 NOTE — Telephone Encounter (Signed)
Successful telephone encounter to patient to follow up on incoming call from patient concerned that he may be back in AF. Attempted to assist patient with sending manual transmission however patient/wife does not remember [facility] login. Provided wife with tech support. Will review manual transmission to assess for AF once received.

## 2021-01-01 ENCOUNTER — Telehealth: Payer: Self-pay | Admitting: Internal Medicine

## 2021-01-01 ENCOUNTER — Ambulatory Visit (INDEPENDENT_AMBULATORY_CARE_PROVIDER_SITE_OTHER): Payer: Medicare Other | Admitting: *Deleted

## 2021-01-01 ENCOUNTER — Other Ambulatory Visit: Payer: Self-pay | Admitting: *Deleted

## 2021-01-01 ENCOUNTER — Other Ambulatory Visit: Payer: Self-pay

## 2021-01-01 DIAGNOSIS — I4891 Unspecified atrial fibrillation: Secondary | ICD-10-CM

## 2021-01-01 DIAGNOSIS — Z7901 Long term (current) use of anticoagulants: Secondary | ICD-10-CM | POA: Diagnosis not present

## 2021-01-01 DIAGNOSIS — Z5181 Encounter for therapeutic drug level monitoring: Secondary | ICD-10-CM

## 2021-01-01 LAB — POCT INR: INR: 2.7 (ref 2.0–3.0)

## 2021-01-01 NOTE — Telephone Encounter (Signed)
The patient Transmission was received. [name] [name] asked the patient to send the transmission yesterday because he believes he is in A-Fib. The patient states he wants the nurse to give him a call back soon.

## 2021-01-01 NOTE — Telephone Encounter (Signed)
Pt would like a refill of TOPROL XL( '100mg'$  daily) sent to [facility] on [address].

## 2021-01-01 NOTE — Telephone Encounter (Signed)
Continue current meds. If his symptoms return/worsen, then he will need to be seen sooner than November.

## 2021-01-01 NOTE — Telephone Encounter (Signed)
  1. Has your device fired? No   2. Is you device beeping? No   3. Are you experiencing draining or swelling at device site? No   4. Are you calling to see if we received your device transmission? yes  5. Have you passed out? no    Please route to [place]

## 2021-01-01 NOTE — Telephone Encounter (Signed)
See 12/31/20 phone encounter for f/u

## 2021-01-01 NOTE — Patient Instructions (Signed)
Description   Continue to take 1/2 a tablet daily except for 1 tablet on Thursdays. Recheck INR in  2 weeks. Coumadin Clinic [phone].

## 2021-01-01 NOTE — Telephone Encounter (Signed)
Manual transmission received.  At time of transmission pt HR was APVS 70 bpm.  Pt did have some AHR and VHR episodes that appear to be AF with RVR at times.  Longest episode lasted 25 minutes on 8/2.  Overall AF Burden 1.2%.    Pt reports he feels tired and has noticed his heart racing.    Pt underwent ortho surgery in early July and states he has not felt well since, he has gone to ED twice since then and once was admitted for AF with RVR.  Medication changes were made at that visit, he was started on Metoprolol and they told him to stop Losartan, inderal and Potassium.  Pt reports he stopped taking Metoprolol and went back to taking meds that Dr. Lovena Le had him on previously about 2 days ago.  Stressed to pt importance of med compliance as ordered.  Advised Metoprolol will help with his HR.   Pt indicated understanding and will resume taking Metoprolol '100mg'$  daily and stop the other meds that were discontinued.    Pt noted that his Right leg (which he had surgery on) remains swollen and he has not been able to exercise due to the pain/ swelling.  Advised pt he needs to f/u with orthopedic MD regarding this welling and pain level.    Pt scheduled for OV  with Dr. Lovena Le on 04/04/21

## 2021-01-02 MED ORDER — METOPROLOL SUCCINATE ER 100 MG PO TB24: 100.0000 mg | ORAL_TABLET | Freq: Every day | ORAL | 0 refills | Status: DC

## 2021-01-02 NOTE — Telephone Encounter (Signed)
Called patient and advised of Dr. Tanna Furry recommendations. Appreciative of call.

## 2021-01-21 ENCOUNTER — Ambulatory Visit (INDEPENDENT_AMBULATORY_CARE_PROVIDER_SITE_OTHER): Payer: Medicare Other

## 2021-01-21 ENCOUNTER — Telehealth: Payer: Self-pay

## 2021-01-21 ENCOUNTER — Other Ambulatory Visit: Payer: Self-pay

## 2021-01-21 DIAGNOSIS — I4891 Unspecified atrial fibrillation: Secondary | ICD-10-CM | POA: Diagnosis not present

## 2021-01-21 DIAGNOSIS — Z7901 Long term (current) use of anticoagulants: Secondary | ICD-10-CM

## 2021-01-21 DIAGNOSIS — Z5181 Encounter for therapeutic drug level monitoring: Secondary | ICD-10-CM

## 2021-01-21 LAB — POCT INR: INR: 1.8 — AB (ref 2.0–3.0)

## 2021-01-21 NOTE — Telephone Encounter (Signed)
Pt in office for INR check.   He s/p knee replacement on 11/27/20. On hospitalization, his losartan and inderal were d/c'd and not restarted. Pt's BP has been elevated at home since this time. He resumed the losartaon his own last Monday (8/22) w/ improvement in his readings and overall feels better. He would like to know if Dr. Lovena Le will resume his BP meds and send in new rx. Please advise. Thank you!

## 2021-01-21 NOTE — Telephone Encounter (Signed)
Please restart all home meds. GT

## 2021-01-21 NOTE — Patient Instructions (Signed)
-   take 1 tablet warfarin tonight, then - continue to take 1/2 a tablet daily except for 1 tablet on Thursdays.  - Recheck INR in 2 weeks.  Coumadin Clinic [phone].

## 2021-01-25 ENCOUNTER — Other Ambulatory Visit: Payer: Self-pay | Admitting: Internal Medicine

## 2021-02-04 ENCOUNTER — Other Ambulatory Visit: Payer: Self-pay

## 2021-02-04 ENCOUNTER — Ambulatory Visit (INDEPENDENT_AMBULATORY_CARE_PROVIDER_SITE_OTHER): Payer: Medicare Other

## 2021-02-04 DIAGNOSIS — Z5181 Encounter for therapeutic drug level monitoring: Secondary | ICD-10-CM

## 2021-02-04 DIAGNOSIS — Z7901 Long term (current) use of anticoagulants: Secondary | ICD-10-CM | POA: Diagnosis not present

## 2021-02-04 DIAGNOSIS — I4891 Unspecified atrial fibrillation: Secondary | ICD-10-CM

## 2021-02-04 LAB — POCT INR: INR: 2 (ref 2.0–3.0)

## 2021-02-04 MED ORDER — METOPROLOL SUCCINATE ER 100 MG PO TB24: 100.0000 mg | ORAL_TABLET | Freq: Every day | ORAL | 3 refills | Status: DC

## 2021-02-04 MED ORDER — LOSARTAN POTASSIUM 25 MG PO TABS
25.0000 mg | ORAL_TABLET | Freq: Every day | ORAL | 3 refills | Status: DC
Start: 2021-02-04 — End: 2023-05-11

## 2021-02-04 NOTE — Patient Instructions (Signed)
Description   Start taking 1/2 a tablet daily except for 1 tablet on Mondays and Thursdays.  Recheck INR in 3 weeks. Coumadin Clinic [phone].

## 2021-02-04 NOTE — Addendum Note (Signed)
Addended by: Eriyah Fernando E on: 02/04/2021 02:17 PM   Modules accepted: Orders

## 2021-02-04 NOTE — Telephone Encounter (Signed)
Refills for Toprol '100mg'$  daily and losartan '25mg'$  daily have been sent to pt's pharmacy.

## 2021-02-25 ENCOUNTER — Other Ambulatory Visit: Payer: Self-pay

## 2021-02-25 ENCOUNTER — Ambulatory Visit (INDEPENDENT_AMBULATORY_CARE_PROVIDER_SITE_OTHER): Payer: Medicare Other

## 2021-02-25 DIAGNOSIS — Z5181 Encounter for therapeutic drug level monitoring: Secondary | ICD-10-CM

## 2021-02-25 DIAGNOSIS — Z7901 Long term (current) use of anticoagulants: Secondary | ICD-10-CM

## 2021-02-25 LAB — POCT INR: INR: 1.9 — AB (ref 2.0–3.0)

## 2021-02-25 NOTE — Patient Instructions (Signed)
Description   Take 1.5 tablets today and then continue taking 1/2 a tablet daily except for 1 tablet on Mondays and Thursdays.  Recheck INR in 2 weeks. Coumadin Clinic [phone].

## 2021-02-26 ENCOUNTER — Ambulatory Visit (INDEPENDENT_AMBULATORY_CARE_PROVIDER_SITE_OTHER): Payer: Medicare Other

## 2021-02-26 DIAGNOSIS — I495 Sick sinus syndrome: Secondary | ICD-10-CM | POA: Diagnosis not present

## 2021-02-27 LAB — CUP PACEART REMOTE DEVICE CHECK
Battery Impedance: 1508 Ohm
Battery Remaining Longevity: 47 mo
Battery Voltage: 2.78 V
Brady Statistic AP VP Percent: 0 %
Brady Statistic AP VS Percent: 98 %
Brady Statistic AS VP Percent: 0 %
Brady Statistic AS VS Percent: 2 %
Date Time Interrogation Session: 20221004155825
Implantable Lead Implant Date: 20070112
Implantable Lead Implant Date: 20070112
Implantable Lead Location: 753859
Implantable Lead Location: 753860
Implantable Lead Model: 5076
Implantable Lead Model: 5076
Implantable Pulse Generator Implant Date: 20131018
Lead Channel Impedance Value: 434 Ohm
Lead Channel Impedance Value: 596 Ohm
Lead Channel Pacing Threshold Amplitude: 0.5 V
Lead Channel Pacing Threshold Amplitude: 1 V
Lead Channel Pacing Threshold Pulse Width: 0.4 ms
Lead Channel Pacing Threshold Pulse Width: 0.4 ms
Lead Channel Setting Pacing Amplitude: 2 V
Lead Channel Setting Pacing Amplitude: 2.5 V
Lead Channel Setting Pacing Pulse Width: 0.34 ms
Lead Channel Setting Sensing Sensitivity: 2.8 mV

## 2021-03-06 NOTE — Progress Notes (Signed)
Remote pacemaker transmission.   

## 2021-03-18 ENCOUNTER — Other Ambulatory Visit: Payer: Self-pay

## 2021-03-18 ENCOUNTER — Ambulatory Visit (INDEPENDENT_AMBULATORY_CARE_PROVIDER_SITE_OTHER): Payer: Medicare Other

## 2021-03-18 DIAGNOSIS — Z7901 Long term (current) use of anticoagulants: Secondary | ICD-10-CM

## 2021-03-18 DIAGNOSIS — I4891 Unspecified atrial fibrillation: Secondary | ICD-10-CM | POA: Diagnosis not present

## 2021-03-18 LAB — POCT INR: INR: 1.8 — AB (ref 2.0–3.0)

## 2021-03-18 NOTE — Patient Instructions (Signed)
-   since you are on Prednisone, take 1/2 tablet warfarin every day this week, then - resume  taking 1/2 a tablet daily except for 1 tablet on Mondays and Thursdays.   - Recheck INR in 1 week Coumadin Clinic [phone].

## 2021-03-25 ENCOUNTER — Ambulatory Visit (INDEPENDENT_AMBULATORY_CARE_PROVIDER_SITE_OTHER): Payer: Medicare Other | Admitting: *Deleted

## 2021-03-25 ENCOUNTER — Other Ambulatory Visit: Payer: Self-pay

## 2021-03-25 DIAGNOSIS — Z7901 Long term (current) use of anticoagulants: Secondary | ICD-10-CM

## 2021-03-25 DIAGNOSIS — I4891 Unspecified atrial fibrillation: Secondary | ICD-10-CM

## 2021-03-25 LAB — POCT INR: INR: 1.4 — AB (ref 2.0–3.0)

## 2021-03-25 NOTE — Patient Instructions (Signed)
Description   Today take 1.5 tablets and tomorrow take 1 tablet then continue taking 1/2 tablet daily except for 1 tablet on Mondays and Thursdays. Recheck INR in 10 days. Coumadin Clinic [phone].

## 2021-04-04 ENCOUNTER — Other Ambulatory Visit: Payer: Self-pay

## 2021-04-04 ENCOUNTER — Ambulatory Visit (INDEPENDENT_AMBULATORY_CARE_PROVIDER_SITE_OTHER): Payer: Medicare Other | Admitting: Internal Medicine

## 2021-04-04 ENCOUNTER — Ambulatory Visit (INDEPENDENT_AMBULATORY_CARE_PROVIDER_SITE_OTHER): Payer: Medicare Other | Admitting: *Deleted

## 2021-04-04 ENCOUNTER — Encounter: Payer: Self-pay | Admitting: Internal Medicine

## 2021-04-04 VITALS — BP 126/84 | HR 86 | Ht 66.0 in | Wt 166.8 lb

## 2021-04-04 DIAGNOSIS — I480 Paroxysmal atrial fibrillation: Secondary | ICD-10-CM | POA: Diagnosis not present

## 2021-04-04 DIAGNOSIS — Z950 Presence of cardiac pacemaker: Secondary | ICD-10-CM | POA: Diagnosis not present

## 2021-04-04 DIAGNOSIS — I951 Orthostatic hypotension: Secondary | ICD-10-CM | POA: Diagnosis not present

## 2021-04-04 DIAGNOSIS — Z7901 Long term (current) use of anticoagulants: Secondary | ICD-10-CM

## 2021-04-04 DIAGNOSIS — I495 Sick sinus syndrome: Secondary | ICD-10-CM | POA: Diagnosis not present

## 2021-04-04 LAB — POCT INR: INR: 2.3 (ref 2.0–3.0)

## 2021-04-04 NOTE — Patient Instructions (Signed)
Description   Continue taking 1/2 tablet daily except for 1 tablet on Mondays and Thursdays. Recheck INR in 3 weeks. Coumadin Clinic [phone].

## 2021-04-04 NOTE — Patient Instructions (Addendum)
Medication Instructions:  Your physician recommends that you continue on your current medications as directed. Please refer to the Current Medication list given to you today.  Labwork: None ordered.  Testing/Procedures: None ordered.  Follow-Up: Your physician wants you to follow-up in: one year with Cristopher Peru, MD   Remote monitoring is used to monitor your Pacemaker from home. This monitoring reduces the number of office visits required to check your device to one time per year. It allows [place] to keep an eye on the functioning of your device to ensure it is working properly. You are scheduled for a device check from home on 05/28/2021. You may send your transmission at any time that day. If you have a wireless device, the transmission will be sent automatically. After your physician reviews your transmission, you will receive a postcard with your next transmission date.  Any Other Special Instructions Will Be Listed Below (If Applicable).  If you need a refill on your cardiac medications before your next appointment, please call your pharmacy.

## 2021-04-04 NOTE — Progress Notes (Signed)
HPI [name] returns today for followup of his PAF. He is a pleasant 80 yo man with a h/o PAF, s/p catheter ablation, and sinus node dysfunction s/p PPM insertion. He has lost weight. He has done well in the interim and his atrial fib burden has improved. He denies chest pain or sob. Since I last saw him he has been in the hospital with post op atrial fib and a RVR. He was started back on metoprolol.  Allergies  Allergen Reactions   Iohexol Hives    POST MYELOGRAM.  PT ALSO SAID THIS HAPPENED A YEAR AGO WITH A MYELOGRAM AND WITH ESI'S., Onset Date: [id number]    Meperidine Hcl Other (See Comments)    hallucinations   Demerol Hcl [Meperidine] Other (See Comments)    Hallucinations   Prednisone Other (See Comments)    Redness of skin & jittery     Current Outpatient Medications  Medication Sig Dispense Refill   acetaminophen (TYLENOL) 500 MG tablet Take 2 tablets (1,000 mg total) by mouth every 6 (six) hours as needed for mild pain or moderate pain. 60 tablet 0   ALPRAZolam (XANAX) 0.5 MG tablet Take 1 tablet (0.5 mg total) by mouth 3 (three) times daily as needed for anxiety. 90 tablet 0   Carboxymethylcellulose Sodium (THERATEARS OP) Place 1 drop into both eyes at bedtime.     Cholecalciferol (DIALYVITE VITAMIN D 5000) 125 MCG (5000 UT) capsule Take 5,000 Units by mouth 3 (three) times a week.     docusate sodium (COLACE) 100 MG capsule Take 100 mg by mouth 2 (two) times daily.     ferrous gluconate (FERGON) 240 (27 FE) MG tablet Take 240 mg by mouth daily.     FLUoxetine (PROZAC) 40 MG capsule Take one tablet each day for depression. (40mg  ) 30 capsule 0   gabapentin (NEURONTIN) 300 MG capsule Take 1 capsule (300 mg total) by mouth 2 (two) times daily for 14 days. For 2 weeks post op for pain. 28 capsule 0   losartan (COZAAR) 25 MG tablet Take 1 tablet (25 mg total) by mouth daily. 90 tablet 3   methocarbamol (ROBAXIN-750) 750 MG tablet Take 1 tablet (750 mg total) by mouth  every 8 (eight) hours as needed for muscle spasms. (Patient not taking: Reported on 12/21/2020) 20 tablet 0   metoprolol succinate (TOPROL XL) 100 MG 24 hr tablet Take 1 tablet (100 mg total) by mouth daily. Take with or immediately following a meal. 90 tablet 3   omeprazole (PRILOSEC OTC) 20 MG tablet Take 1 tablet (20 mg total) by mouth daily. For gastric protection 30 tablet 0   ondansetron (ZOFRAN ODT) 4 MG disintegrating tablet Take 1 tablet (4 mg total) by mouth 2 (two) times daily as needed for nausea or vomiting. (Patient not taking: Reported on 12/21/2020) 10 tablet 0   oxyCODONE (ROXICODONE) 5 MG immediate release tablet Take 1 tablet (5 mg total) by mouth every 8 (eight) hours as needed. 20 tablet 0   tamsulosin (FLOMAX) 0.4 MG CAPS capsule Take 0.4 mg by mouth daily.     vitamin B-12 (CYANOCOBALAMIN) 1000 MCG tablet Take 1,000 mcg by mouth daily.     warfarin (COUMADIN) 5 MG tablet Take 0.5 tablets (2.5 mg total) by mouth daily. Take 0.5 tablets (2.5 mg total) by mouth daily except take 1 tablet (5mg ) on Thursdays. Take as directed by the anticoagulation clinic 15 tablet 0   No current facility-administered medications for this  visit.     Past Medical History:  Diagnosis Date   Anemia    since post op- knee replacement    Anxiety    takes xanax 2 times per day, taken mostly for ringing in ears     Arthritis of knee, right    end-stage   Bronchitis    Bursitis    Complication of anesthesia    irritation in throat postop, makes a-fib worse    Depression    DJD (degenerative joint disease)    OA- "everywhere"   Dysrhythmia    Family history of anesthesia complication    sister with nausia and vomiting   Gout    Heart murmur    HTN (hypertension)    Hyperlipidemia    Migraines    Paroxysmal atrial fibrillation (HCC)    PONV (postoperative nausea and vomiting)    Presence of permanent cardiac pacemaker    Tinnitus     ROS:   All systems reviewed and negative except as  noted in the HPI.   Past Surgical History:  Procedure Laterality Date   APPENDECTOMY     Atrial fibrillation ablation  2009, 12/02/11   PVI by Dr Rayann Heman x 2   ATRIAL FIBRILLATION ABLATION N/A 12/02/2011   Procedure: ATRIAL FIBRILLATION ABLATION;  Surgeon: Thompson Grayer, MD;  Location: [facility] CATH LAB;  Service: Cardiovascular;  Laterality: N/A;   bilteral thumb joint surgrery     CARDIAC CATHETERIZATION  2005    which revealed 40% stenosis of the mid left anterior descending artery., ablation- 2009   CHOLECYSTECTOMY     JOINT REPLACEMENT     L knee- 12/11,R hip- 1996   left rotator cuff repair     PACEMAKER INSERTION  06/06/2005   Medtronic EnRhythm dual-chamber. For sick sinus syndrome   PERMANENT PACEMAKER GENERATOR CHANGE N/A 03/12/2012   Procedure: PERMANENT PACEMAKER GENERATOR CHANGE;  Surgeon: Evans Lance, MD;  Location: [facility] CATH LAB;  Service: Cardiovascular;  Laterality: N/A;   RHINOPLASTY     right rotator cuff surgery  2006   right total hip arthroplasty     TEE WITHOUT CARDIOVERSION  12/01/2011   Procedure: TRANSESOPHAGEAL ECHOCARDIOGRAM (TEE);  Surgeon: Fay Records, MD;  Location: [place];  Service: Cardiovascular;  Laterality: N/A;  a-fib ablation following day   TONSILLECTOMY     as a child   TOTAL KNEE ARTHROPLASTY Right 11/27/2020   Procedure: TOTAL KNEE ARTHROPLASTY;  Surgeon: Renette Butters, MD;  Location: [facility];  Service: Orthopedics;  Laterality: Right;   TOTAL KNEE REVISION  08/08/2011   Procedure: TOTAL KNEE REVISION;  Surgeon: Kerin Salen, MD;  Location: [place];  Service: Orthopedics;  Laterality: Left;  DEPUY/SIGMA   TRANSESOPHAGEAL ECHOCARDIOGRAM  04/2008   TRANSTHORACIC ECHOCARDIOGRAM  2006,2009   uvulectomy       Family History  Problem Relation Age of Onset   Coronary artery disease Mother    Cerebrovascular Disease Mother    Cancer Other    Anesthesia problems Neg Hx    Hypotension Neg Hx    Malignant hyperthermia Neg Hx    Pseudochol  deficiency Neg Hx      Social History   Socioeconomic History   Marital status: Married    Spouse name: Not on file   Number of children: 2   Years of education: Not on file   Highest education level: Not on file  Occupational History   Occupation: retired    Fish farm manager: RETIRED  Tobacco Use  Smoking status: Never   Smokeless tobacco: Never  Vaping Use   Vaping Use: Never used  Substance and Sexual Activity   Alcohol use: Not Currently    Comment: rare use   Drug use: No   Sexual activity: Not on file  Other Topics Concern   Not on file  Social History Narrative   Not on file   Social Determinants of Health   Financial Resource Strain: Not on file  Food Insecurity: Not on file  Transportation Needs: Not on file  Physical Activity: Not on file  Stress: Not on file  Social Connections: Not on file  Intimate Partner Violence: Not on file     There were no vitals taken for this visit.  Physical Exam:  Well appearing NAD HEENT: Unremarkable Neck:  No JVD, no thyromegally Lymphatics:  No adenopathy Back:  No CVA tenderness Lungs:  Clear HEART:  Regular rate rhythm, no murmurs, no rubs, no clicks Abd:  soft, positive bowel sounds, no organomegally, no rebound, no guarding Ext:  2 plus pulses, no edema, no cyanosis, no clubbing Skin:  No rashes no nodules Neuro:  CN II through XII intact, motor grossly intact  DEVICE  Normal device function.  See PaceArt for details.   Assess/Plan:  1. Sinus node dysfunction - he is asymptomatic, s/p PPM insertion. 2. PAF - he is maintaining NSR about 97% of the time.  3. HTN - his bp is up a bit but is usually better at home. 4. PPM - his medtronic DDD PM is working [employer] and has about 4 years of battery longevity.   Carleene Overlie Dayana Dalporto,MD

## 2021-04-09 LAB — CUP PACEART INCLINIC DEVICE CHECK
Battery Impedance: 1452 Ohm
Battery Remaining Longevity: 49 mo
Battery Voltage: 2.77 V
Brady Statistic AP VP Percent: 0 %
Brady Statistic AP VS Percent: 98 %
Brady Statistic AS VP Percent: 0 %
Brady Statistic AS VS Percent: 2 %
Date Time Interrogation Session: 20221110115700
Implantable Lead Implant Date: 20070112
Implantable Lead Implant Date: 20070112
Implantable Lead Location: 753859
Implantable Lead Location: 753860
Implantable Lead Model: 5076
Implantable Lead Model: 5076
Implantable Pulse Generator Implant Date: 20131018
Lead Channel Impedance Value: 460 Ohm
Lead Channel Impedance Value: 678 Ohm
Lead Channel Pacing Threshold Amplitude: 0.5 V
Lead Channel Pacing Threshold Amplitude: 0.5 V
Lead Channel Pacing Threshold Amplitude: 0.75 V
Lead Channel Pacing Threshold Amplitude: 1 V
Lead Channel Pacing Threshold Pulse Width: 0.34 ms
Lead Channel Pacing Threshold Pulse Width: 0.4 ms
Lead Channel Pacing Threshold Pulse Width: 0.4 ms
Lead Channel Pacing Threshold Pulse Width: 0.4 ms
Lead Channel Sensing Intrinsic Amplitude: 4 mV
Lead Channel Sensing Intrinsic Amplitude: 8 mV
Lead Channel Setting Pacing Amplitude: 2 V
Lead Channel Setting Pacing Amplitude: 2.5 V
Lead Channel Setting Pacing Pulse Width: 0.34 ms
Lead Channel Setting Sensing Sensitivity: 4 mV

## 2021-04-25 ENCOUNTER — Ambulatory Visit (INDEPENDENT_AMBULATORY_CARE_PROVIDER_SITE_OTHER): Payer: Medicare Other | Admitting: *Deleted

## 2021-04-25 ENCOUNTER — Other Ambulatory Visit: Payer: Self-pay

## 2021-04-25 DIAGNOSIS — I480 Paroxysmal atrial fibrillation: Secondary | ICD-10-CM | POA: Diagnosis not present

## 2021-04-25 DIAGNOSIS — Z7901 Long term (current) use of anticoagulants: Secondary | ICD-10-CM | POA: Diagnosis not present

## 2021-04-25 LAB — POCT INR: INR: 2.1 (ref 2.0–3.0)

## 2021-04-25 NOTE — Patient Instructions (Signed)
Description   Continue taking 1/2 tablet daily except for 1 tablet on Mondays and Thursdays. Recheck INR in 4 weeks. Coumadin Clinic [phone].

## 2021-05-23 ENCOUNTER — Other Ambulatory Visit: Payer: Self-pay

## 2021-05-23 ENCOUNTER — Ambulatory Visit (INDEPENDENT_AMBULATORY_CARE_PROVIDER_SITE_OTHER): Payer: Medicare Other | Admitting: *Deleted

## 2021-05-23 DIAGNOSIS — I480 Paroxysmal atrial fibrillation: Secondary | ICD-10-CM | POA: Diagnosis not present

## 2021-05-23 DIAGNOSIS — Z7901 Long term (current) use of anticoagulants: Secondary | ICD-10-CM

## 2021-05-23 LAB — POCT INR: INR: 2.6 (ref 2.0–3.0)

## 2021-05-23 NOTE — Patient Instructions (Signed)
Description   Continue taking 1/2 tablet daily except for 1 tablet on Mondays and Thursdays. Recheck INR in 5 weeks. Coumadin Clinic [phone].

## 2021-05-28 ENCOUNTER — Ambulatory Visit (INDEPENDENT_AMBULATORY_CARE_PROVIDER_SITE_OTHER): Payer: Medicare Other

## 2021-05-28 DIAGNOSIS — I495 Sick sinus syndrome: Secondary | ICD-10-CM

## 2021-05-29 LAB — CUP PACEART REMOTE DEVICE CHECK
Battery Impedance: 1565 Ohm
Battery Remaining Longevity: 46 mo
Battery Voltage: 2.77 V
Brady Statistic AP VP Percent: 0 %
Brady Statistic AP VS Percent: 98 %
Brady Statistic AS VP Percent: 0 %
Brady Statistic AS VS Percent: 2 %
Date Time Interrogation Session: 20230103192526
Implantable Lead Implant Date: 20070112
Implantable Lead Implant Date: 20070112
Implantable Lead Location: 753859
Implantable Lead Location: 753860
Implantable Lead Model: 5076
Implantable Lead Model: 5076
Implantable Pulse Generator Implant Date: 20131018
Lead Channel Impedance Value: 454 Ohm
Lead Channel Impedance Value: 604 Ohm
Lead Channel Pacing Threshold Amplitude: 0.5 V
Lead Channel Pacing Threshold Amplitude: 1 V
Lead Channel Pacing Threshold Pulse Width: 0.4 ms
Lead Channel Pacing Threshold Pulse Width: 0.4 ms
Lead Channel Setting Pacing Amplitude: 2 V
Lead Channel Setting Pacing Amplitude: 2.5 V
Lead Channel Setting Pacing Pulse Width: 0.34 ms
Lead Channel Setting Sensing Sensitivity: 2.8 mV

## 2021-06-07 NOTE — Progress Notes (Signed)
Remote pacemaker transmission.   

## 2021-06-17 ENCOUNTER — Other Ambulatory Visit: Payer: Self-pay

## 2021-06-17 ENCOUNTER — Ambulatory Visit
Admission: EM | Admit: 2021-06-17 | Discharge: 2021-06-17 | Disposition: A | Discharge status: Home / Self Care | Payer: Medicare Other | Attending: Internal Medicine | Admitting: Internal Medicine

## 2021-06-17 ENCOUNTER — Encounter: Payer: Self-pay | Admitting: Emergency Medicine

## 2021-06-17 DIAGNOSIS — Z23 Encounter for immunization: Secondary | ICD-10-CM

## 2021-06-17 DIAGNOSIS — W5501XA Bitten by cat, initial encounter: Secondary | ICD-10-CM

## 2021-06-17 DIAGNOSIS — S81832A Puncture wound without foreign body, left lower leg, initial encounter: Secondary | ICD-10-CM | POA: Diagnosis not present

## 2021-06-17 MED ORDER — AMOXICILLIN-POT CLAVULANATE 875-125 MG PO TABS: 1.0000 | ORAL_TABLET | Freq: Two times a day (BID) | ORAL | 0 refills | Status: DC

## 2021-06-17 MED ORDER — TETANUS-DIPHTH-ACELL PERTUSSIS 5-2.5-18.5 LF-MCG/0.5 IM SUSY
0.5000 mL | PREFILLED_SYRINGE | Freq: Once | INTRAMUSCULAR | Status: AC
  Administered 2021-06-17: 0.5 mL via INTRAMUSCULAR

## 2021-06-17 NOTE — Discharge Instructions (Signed)
You have been given an antibiotic to treat/prevent infection after cat bite.  Please change dressing daily and as needed if it becomes soiled.  This vaccine has also been updated.  Please follow-up with primary care doctor for further evaluation and management to ensure adequate healing.

## 2021-06-17 NOTE — ED Triage Notes (Signed)
His cat scratched his left leg yesterday. Created a couple skin tears around his shin. His cat does have all rabies vaccines

## 2021-06-17 NOTE — ED Provider Notes (Signed)
[name]    CSN: [id number] Arrival date & time: 06/17/21  0857      History   Chief Complaint Chief Complaint  Patient presents with   Abrasion    HPI [name] [name] is a 81 y.o. male.   Patient presents with cat bite that is present to anterior and posterior left lower extremity that occurred yesterday.  Patient reports that his cat came inside and attacked his leg.  He denies that his cat has ever done this before.  This is his own cat and patient reports that rabies vaccines are up-to-date.  Patient does take blood thinners but denies any severe bleeding after bite.  Bleeding is controlled currently today.  Denies any numbness or tingling to area.  Patient has full range of motion of left lower extremity, ankle, foot.  Denies any fevers, body aches, chills.  Tetanus vaccine is not up-to-date per patient.    Past Medical History:  Diagnosis Date   Anemia    since post op- knee replacement    Anxiety    takes xanax 2 times per day, taken mostly for ringing in ears     Arthritis of knee, right    end-stage   Bronchitis    Bursitis    Complication of anesthesia    irritation in throat postop, makes a-fib worse    Depression    DJD (degenerative joint disease)    OA- "everywhere"   Dysrhythmia    Family history of anesthesia complication    sister with nausia and vomiting   Gout    Heart murmur    HTN (hypertension)    Hyperlipidemia    Migraines    Paroxysmal atrial fibrillation (HCC)    PONV (postoperative nausea and vomiting)    Presence of permanent cardiac pacemaker    Tinnitus     Patient Active Problem List   Diagnosis Date Noted   Orthostasis 04/04/2021   Hyponatremia 12/01/2020   S/P total knee arthroplasty, right 11/27/2020   Sick sinus syndrome ([place]) 08/03/2019   Major depressive disorder, recurrent episode, severe ([place]) 03/26/2013   Suicidal ideation 03/26/2013   Pain due to total left knee replacement ([place]) 08/09/2011   Long  term current use of anticoagulant 07/15/2010   PPM-Medtronic 06/05/2009   HEADACHE 04/11/2009   HYPERTENSION, BENIGN 04/19/2008   Atrial fibrillation ([place]) 04/19/2008    Past Surgical History:  Procedure Laterality Date   APPENDECTOMY     Atrial fibrillation ablation  2009, 12/02/11   PVI by Dr Rayann Heman x 2   ATRIAL FIBRILLATION ABLATION N/A 12/02/2011   Procedure: ATRIAL FIBRILLATION ABLATION;  Surgeon: Thompson Grayer, MD;  Location: [facility] CATH LAB;  Service: Cardiovascular;  Laterality: N/A;   bilteral thumb joint surgrery     CARDIAC CATHETERIZATION  2005    which revealed 40% stenosis of the mid left anterior descending artery., ablation- 2009   CHOLECYSTECTOMY     JOINT REPLACEMENT     L knee- 12/11,R hip- 1996   left rotator cuff repair     PACEMAKER INSERTION  06/06/2005   Medtronic EnRhythm dual-chamber. For sick sinus syndrome   PERMANENT PACEMAKER GENERATOR CHANGE N/A 03/12/2012   Procedure: PERMANENT PACEMAKER GENERATOR CHANGE;  Surgeon: Evans Lance, MD;  Location: [facility] CATH LAB;  Service: Cardiovascular;  Laterality: N/A;   RHINOPLASTY     right rotator cuff surgery  2006   right total hip arthroplasty     TEE WITHOUT CARDIOVERSION  12/01/2011   Procedure:  TRANSESOPHAGEAL ECHOCARDIOGRAM (TEE);  Surgeon: Fay Records, MD;  Location: [place];  Service: Cardiovascular;  Laterality: N/A;  a-fib ablation following day   TONSILLECTOMY     as a child   TOTAL KNEE ARTHROPLASTY Right 11/27/2020   Procedure: TOTAL KNEE ARTHROPLASTY;  Surgeon: Renette Butters, MD;  Location: [facility];  Service: Orthopedics;  Laterality: Right;   TOTAL KNEE REVISION  08/08/2011   Procedure: TOTAL KNEE REVISION;  Surgeon: Kerin Salen, MD;  Location: [place];  Service: Orthopedics;  Laterality: Left;  DEPUY/SIGMA   TRANSESOPHAGEAL ECHOCARDIOGRAM  04/2008   TRANSTHORACIC ECHOCARDIOGRAM  7867,6720   uvulectomy         Home Medications    Prior to Admission medications   Medication Sig Start Date End  Date Taking? Authorizing Provider  amoxicillin-clavulanate (AUGMENTIN) 875-125 MG tablet Take 1 tablet by mouth every 12 (twelve) hours. 06/17/21  Yes , Hildred Alamin E, FNP  acetaminophen (TYLENOL) 500 MG tablet Take 2 tablets (1,000 mg total) by mouth every 6 (six) hours as needed for mild pain or moderate pain. 11/28/20   Britt Bottom, PA-C  ALPRAZolam Duanne Moron) 0.5 MG tablet Take 1 tablet (0.5 mg total) by mouth 3 (three) times daily as needed for anxiety. 03/28/13   Ruben Im, PA-C  Carboxymethylcellulose Sodium (THERATEARS OP) Place 1 drop into both eyes at bedtime.    [provider]  Cholecalciferol (DIALYVITE VITAMIN D 5000) 125 MCG (5000 UT) capsule Take 5,000 Units by mouth 3 (three) times a week.    [provider]  docusate sodium (COLACE) 100 MG capsule Take 100 mg by mouth 2 (two) times daily.    [provider]  ferrous gluconate (FERGON) 240 (27 FE) MG tablet Take 240 mg by mouth daily.    [provider]  FLUoxetine (PROZAC) 40 MG capsule Take one tablet each day for depression. (40mg  ) 03/28/13   Mashburn, Milta Deiters T, PA-C  gabapentin (NEURONTIN) 300 MG capsule Take 1 capsule (300 mg total) by mouth 2 (two) times daily for 14 days. For 2 weeks post op for pain. 11/28/20 12/12/20  Britt Bottom, PA-C  losartan (COZAAR) 25 MG tablet Take 1 tablet (25 mg total) by mouth daily. 02/04/21   Evans Lance, MD  methocarbamol (ROBAXIN-750) 750 MG tablet Take 1 tablet (750 mg total) by mouth every 8 (eight) hours as needed for muscle spasms. Patient not taking: Reported on 04/04/2021 11/28/20   Britt Bottom, PA-C  metoprolol succinate (TOPROL XL) 100 MG 24 hr tablet Take 1 tablet (100 mg total) by mouth daily. Take with or immediately following a meal. 02/04/21   Evans Lance, MD  omeprazole (PRILOSEC OTC) 20 MG tablet Take 1 tablet (20 mg total) by mouth daily. For gastric protection 11/28/20 12/28/20  Aggie Moats M, PA-C  ondansetron (ZOFRAN ODT) 4 MG  disintegrating tablet Take 1 tablet (4 mg total) by mouth 2 (two) times daily as needed for nausea or vomiting. Patient not taking: Reported on 04/04/2021 11/28/20   Britt Bottom, PA-C  oxyCODONE (ROXICODONE) 5 MG immediate release tablet Take 1 tablet (5 mg total) by mouth every 8 (eight) hours as needed. Patient not taking: Reported on 04/04/2021 11/28/20 11/28/21  Britt Bottom, PA-C  tamsulosin (FLOMAX) 0.4 MG CAPS capsule Take 0.4 mg by mouth in the morning and at bedtime. 08/10/20   [provider]  vitamin B-12 (CYANOCOBALAMIN) 1000 MCG tablet Take 1,000 mcg by mouth daily.  [provider]  warfarin (COUMADIN) 5 MG tablet Take 0.5 tablets (2.5 mg total) by mouth daily. Take 0.5 tablets (2.5 mg total) by mouth daily except take 1 tablet (5mg ) on Thursdays. Take as directed by the anticoagulation clinic 12/04/20 01/03/21  Scarlett Presto, MD    Family History Family History  Problem Relation Age of Onset   Coronary artery disease Mother    Cerebrovascular Disease Mother    Cancer Other    Anesthesia problems Neg Hx    Hypotension Neg Hx    Malignant hyperthermia Neg Hx    Pseudochol deficiency Neg Hx     Social History Social History   Tobacco Use   Smoking status: Never   Smokeless tobacco: Never  Vaping Use   Vaping Use: Never used  Substance Use Topics   Alcohol use: Not Currently    Comment: rare use   Drug use: No     Allergies   Iohexol, Meperidine hcl, Demerol hcl [meperidine], and Prednisone   Review of Systems Review of Systems Per HPI  Physical Exam Triage Vital Signs ED Triage Vitals  Enc Vitals Group     BP 06/17/21 0915 122/83     Pulse Rate 06/17/21 0915 83     Resp 06/17/21 0915 16     Temp 06/17/21 0915 97.9 F (36.6 C)     Temp Source 06/17/21 0915 Oral     SpO2 06/17/21 0915 96 %     Weight --      Height --      Head Circumference --      Peak Flow --      Pain Score 06/17/21 0916 0     Pain Loc --      Pain Edu? --       Excl. in [place]? --    No data found.  Updated Vital Signs BP 122/83 (BP Location: Right Arm)    Pulse 83    Temp 97.9 F (36.6 C) (Oral)    Resp 16    SpO2 96%   Visual Acuity Right Eye Distance:   Left Eye Distance:   Bilateral Distance:    Right Eye Near:   Left Eye Near:    Bilateral Near:     Physical Exam Constitutional:      General: He is not in acute distress.    Appearance: Normal appearance. He is not toxic-appearing or diaphoretic.  HENT:     Head: Normocephalic and atraumatic.  Eyes:     Extraocular Movements: Extraocular movements intact.     Conjunctiva/sclera: Conjunctivae normal.  Pulmonary:     Effort: Pulmonary effort is normal.  Skin:    General: Skin is warm and dry.     Findings: Wound present.     Comments: Patient has skin tear and 2 puncture wounds located to left lower extremity.  One puncture wound is anterior, the other is posterior located at calf.  There is dried blood present.  No deep wounds present.  Puncture wounds are superficial.  Bleeding is controlled. Neurovascular intact.  Patient has full range of motion of left lower extremity.  Neurological:     General: No focal deficit present.     Mental Status: He is alert and oriented to person, place, and time. Mental status is at baseline.  Psychiatric:        Mood and Affect: Mood normal.        Behavior: Behavior normal.        Thought  Content: Thought content normal.        Judgment: Judgment normal.     UC Treatments / Results  Labs (all labs ordered are listed, but only abnormal results are displayed) Labs Reviewed - No data to display  EKG   Radiology No results found.  Procedures Procedures (including critical care time)  Medications Ordered in UC Medications  Tdap (BOOSTRIX) injection 0.5 mL (0.5 mLs Intramuscular Given 06/17/21 1134)    Initial Impression / Assessment and Plan / UC Course  I have reviewed the triage vital signs and the nursing notes.  Pertinent  labs & imaging results that were available during my care of the patient were reviewed by me and considered in my medical decision making (see chart for details).     Wound was cleaned and nonadherent dressing was applied in urgent care today.  Wounds are superficial and do not require more extensive closure for treatment.  Will prescribe Augmentin due to cat bite.  Tetanus vaccine also updated today.  Patient educated on wound management.  Discussed strict return precautions.  Patient verbalized understanding and was agreeable with plan.  Final Clinical Impressions(s) / UC Diagnoses   Final diagnoses:  Cat bite, initial encounter     Discharge Instructions      You have been given an antibiotic to treat/prevent infection after cat bite.  Please change dressing daily and as needed if it becomes soiled.  This vaccine has also been updated.  Please follow-up with primary care doctor for further evaluation and management to ensure adequate healing.    ED Prescriptions     Medication Sig Dispense Auth. Provider   amoxicillin-clavulanate (AUGMENTIN) 875-125 MG tablet Take 1 tablet by mouth every 12 (twelve) hours. 14 tablet [place], Michele Rockers, Ignacio      PDMP not reviewed this encounter.   Teodora Medici, [place] 06/17/21 [phone]

## 2021-06-27 ENCOUNTER — Ambulatory Visit (INDEPENDENT_AMBULATORY_CARE_PROVIDER_SITE_OTHER): Payer: Medicare Other

## 2021-06-27 ENCOUNTER — Other Ambulatory Visit: Payer: Self-pay

## 2021-06-27 DIAGNOSIS — Z7901 Long term (current) use of anticoagulants: Secondary | ICD-10-CM

## 2021-06-27 DIAGNOSIS — I480 Paroxysmal atrial fibrillation: Secondary | ICD-10-CM | POA: Diagnosis not present

## 2021-06-27 LAB — POCT INR: INR: 2.8 (ref 2.0–3.0)

## 2021-06-27 NOTE — Patient Instructions (Signed)
Description   Continue taking 1/2 tablet daily except for 1 tablet on Mondays and Thursdays. Recheck INR in 6 weeks. Coumadin Clinic [phone].      

## 2021-07-06 ENCOUNTER — Other Ambulatory Visit: Payer: Self-pay | Admitting: Student in an Organized Health Care Education/Training Program

## 2021-07-11 ENCOUNTER — Other Ambulatory Visit: Payer: Self-pay | Admitting: Student in an Organized Health Care Education/Training Program

## 2021-07-12 ENCOUNTER — Other Ambulatory Visit: Payer: Self-pay | Admitting: Student in an Organized Health Care Education/Training Program

## 2021-07-16 ENCOUNTER — Other Ambulatory Visit: Payer: Self-pay

## 2021-07-16 MED ORDER — WARFARIN SODIUM 5 MG PO TABS: ORAL_TABLET | ORAL | 3 refills | Status: DC

## 2021-08-08 ENCOUNTER — Other Ambulatory Visit: Payer: Self-pay

## 2021-08-08 ENCOUNTER — Ambulatory Visit (INDEPENDENT_AMBULATORY_CARE_PROVIDER_SITE_OTHER): Payer: Medicare Other

## 2021-08-08 DIAGNOSIS — Z7901 Long term (current) use of anticoagulants: Secondary | ICD-10-CM | POA: Diagnosis not present

## 2021-08-08 DIAGNOSIS — I480 Paroxysmal atrial fibrillation: Secondary | ICD-10-CM | POA: Diagnosis not present

## 2021-08-08 LAB — POCT INR: INR: 2.6 (ref 2.0–3.0)

## 2021-08-08 NOTE — Patient Instructions (Signed)
Description   Continue taking 1/2 tablet daily except for 1 tablet on Mondays and Thursdays. Recheck INR in 6 weeks. Coumadin Clinic [phone].      

## 2021-08-27 ENCOUNTER — Ambulatory Visit (INDEPENDENT_AMBULATORY_CARE_PROVIDER_SITE_OTHER): Payer: Medicare Other

## 2021-08-27 DIAGNOSIS — I495 Sick sinus syndrome: Secondary | ICD-10-CM

## 2021-08-28 LAB — CUP PACEART REMOTE DEVICE CHECK
Battery Impedance: 1620 Ohm
Battery Remaining Longevity: 45 mo
Battery Voltage: 2.78 V
Brady Statistic AP VP Percent: 0 %
Brady Statistic AP VS Percent: 98 %
Brady Statistic AS VP Percent: 0 %
Brady Statistic AS VS Percent: 2 %
Date Time Interrogation Session: 20230404164106
Implantable Lead Implant Date: 20070112
Implantable Lead Implant Date: 20070112
Implantable Lead Location: 753859
Implantable Lead Location: 753860
Implantable Lead Model: 5076
Implantable Lead Model: 5076
Implantable Pulse Generator Implant Date: 20131018
Lead Channel Impedance Value: 480 Ohm
Lead Channel Impedance Value: 606 Ohm
Lead Channel Pacing Threshold Amplitude: 0.625 V
Lead Channel Pacing Threshold Amplitude: 1.125 V
Lead Channel Pacing Threshold Pulse Width: 0.4 ms
Lead Channel Pacing Threshold Pulse Width: 0.4 ms
Lead Channel Setting Pacing Amplitude: 2 V
Lead Channel Setting Pacing Amplitude: 2.5 V
Lead Channel Setting Pacing Pulse Width: 0.34 ms
Lead Channel Setting Sensing Sensitivity: 2.8 mV

## 2021-09-12 NOTE — Progress Notes (Signed)
Remote pacemaker transmission.   

## 2021-09-13 ENCOUNTER — Ambulatory Visit
Admission: EM | Admit: 2021-09-13 | Discharge: 2021-09-13 | Disposition: A | Discharge status: Home / Self Care | Payer: Medicare Other | Attending: Physician Assistant | Admitting: Physician Assistant

## 2021-09-13 ENCOUNTER — Encounter: Payer: Self-pay | Admitting: Emergency Medicine

## 2021-09-13 DIAGNOSIS — S81801A Unspecified open wound, right lower leg, initial encounter: Secondary | ICD-10-CM | POA: Diagnosis not present

## 2021-09-13 DIAGNOSIS — L089 Local infection of the skin and subcutaneous tissue, unspecified: Secondary | ICD-10-CM | POA: Diagnosis not present

## 2021-09-13 MED ORDER — CEPHALEXIN 500 MG PO CAPS: 500.0000 mg | ORAL_CAPSULE | Freq: Four times a day (QID) | ORAL | 0 refills | Status: DC

## 2021-09-13 MED ORDER — MUPIROCIN 2 % EX OINT: 1.0000 "application " | TOPICAL_OINTMENT | Freq: Every day | CUTANEOUS | 0 refills | Status: DC

## 2021-09-13 NOTE — ED Provider Notes (Signed)
?[place] ? ? ? ?CSN: [id number] ?Arrival date & time: 09/13/21  2774 ? ? ?  ? ?History   ?Chief Complaint ?Chief Complaint  ?Patient presents with  ? Leg Injury  ? ? ?HPI ?[name] [name] is a 81 y.o. male.  ? ?Patient presents today with an 8-day history of wound to his right posterior leg.  Reports that he hit himself with a shovel this area has persisted since that time.  He reports over the past several days it has become more painful and swollen.  He reports some serous drainage but denies any purulent drainage.  Reports pain is rated 3 on a 0-10 pain scale, described as aching, no aggravating relieving factors identified.  He denies history of recurrent skin infections or MRSA.  Was treated with Augmentin in January due to cat bite but denies additional antibiotic use since that time.  He has been keeping area clean and applying iodine as well as Neosporin.  He is up-to-date on his tetanus which was given during his cat bite evaluation January 2023.  He is chronically anticoagulated with warfarin.  His last INR was 2.6 on 08/08/2021.  He has an appointment coming up with Coumadin clinic next week. ? ? ?Past Medical History:  ?Diagnosis Date  ? Anemia   ? since post op- knee replacement   ? Anxiety   ? takes xanax 2 times per day, taken mostly for ringing in ears    ? Arthritis of knee, right   ? end-stage  ? Bronchitis   ? Bursitis   ? Complication of anesthesia   ? irritation in throat postop, makes a-fib worse   ? Depression   ? DJD (degenerative joint disease)   ? OA- "everywhere"  ? Dysrhythmia   ? Family history of anesthesia complication   ? sister with nausia and vomiting  ? Gout   ? Heart murmur   ? HTN (hypertension)   ? Hyperlipidemia   ? Migraines   ? Paroxysmal atrial fibrillation (HCC)   ? PONV (postoperative nausea and vomiting)   ? Presence of permanent cardiac pacemaker   ? Tinnitus   ? ? ?Patient Active Problem List  ? Diagnosis Date Noted  ? Orthostasis 04/04/2021  ?  Hyponatremia 12/01/2020  ? S/P total knee arthroplasty, right 11/27/2020  ? Sick sinus syndrome ([place]) 08/03/2019  ? Major depressive disorder, recurrent episode, severe ([place]) 03/26/2013  ? Suicidal ideation 03/26/2013  ? Pain due to total left knee replacement ([place]) 08/09/2011  ? Long term current use of anticoagulant 07/15/2010  ? PPM-Medtronic 06/05/2009  ? HEADACHE 04/11/2009  ? HYPERTENSION, BENIGN 04/19/2008  ? Atrial fibrillation ([place]) 04/19/2008  ? ? ?Past Surgical History:  ?Procedure Laterality Date  ? APPENDECTOMY    ? Atrial fibrillation ablation  2009, 12/02/11  ? PVI by Dr Rayann Heman x 2  ? ATRIAL FIBRILLATION ABLATION N/A 12/02/2011  ? Procedure: ATRIAL FIBRILLATION ABLATION;  Surgeon: Thompson Grayer, MD;  Location: [facility] CATH LAB;  Service: Cardiovascular;  Laterality: N/A;  ? bilteral thumb joint surgrery    ? CARDIAC CATHETERIZATION  2005  ?  which revealed 40% stenosis of the mid left anterior descending artery., ablation- 2009  ? CHOLECYSTECTOMY    ? JOINT REPLACEMENT    ? L knee- 12/11,R hip- 1996  ? left rotator cuff repair    ? PACEMAKER INSERTION  06/06/2005  ? Medtronic EnRhythm dual-chamber. For sick sinus syndrome  ? PERMANENT PACEMAKER GENERATOR CHANGE N/A 03/12/2012  ? Procedure:  PERMANENT PACEMAKER GENERATOR CHANGE;  Surgeon: Evans Lance, MD;  Location: [facility] CATH LAB;  Service: Cardiovascular;  Laterality: N/A;  ? RHINOPLASTY    ? right rotator cuff surgery  2006  ? right total hip arthroplasty    ? TEE WITHOUT CARDIOVERSION  12/01/2011  ? Procedure: TRANSESOPHAGEAL ECHOCARDIOGRAM (TEE);  Surgeon: Fay Records, MD;  Location: [facility] ENDOSCOPY;  Service: Cardiovascular;  Laterality: N/A;  a-fib ablation following day  ? TONSILLECTOMY    ? as a child  ? TOTAL KNEE ARTHROPLASTY Right 11/27/2020  ? Procedure: TOTAL KNEE ARTHROPLASTY;  Surgeon: Renette Butters, MD;  Location: [facility] ORS;  Service: Orthopedics;  Laterality: Right;  ? TOTAL KNEE REVISION  08/08/2011  ? Procedure: TOTAL KNEE REVISION;  Surgeon: Kerin Salen, MD;  Location: [place];  Service: Orthopedics;  Laterality: Left;  DEPUY/SIGMA  ? TRANSESOPHAGEAL ECHOCARDIOGRAM  04/2008  ? TRANSTHORACIC ECHOCARDIOGRAM  2006,2009  ? uvulectomy    ? ? ? ? ? ?Home Medications   ? ?Prior to Admission medications   ?Medication Sig Start Date End Date Taking? Authorizing Provider  ?acetaminophen (TYLENOL) 500 MG tablet Take 2 tablets (1,000 mg total) by mouth every 6 (six) hours as needed for mild pain or moderate pain. 11/28/20  Yes Britt Bottom, PA-C  ?ALPRAZolam (XANAX) 0.5 MG tablet Take 1 tablet (0.5 mg total) by mouth 3 (three) times daily as needed for anxiety. 03/28/13  Yes Ruben Im, PA-C  ?Carboxymethylcellulose Sodium (THERATEARS OP) Place 1 drop into both eyes at bedtime.   Yes [provider]  ?cephALEXin (KEFLEX) 500 MG capsule Take 1 capsule (500 mg total) by mouth 4 (four) times daily. 09/13/21  Yes Jaretzi Droz, Derry Skill, PA-C  ?Cholecalciferol (DIALYVITE VITAMIN D 5000) 125 MCG (5000 UT) capsule Take 5,000 Units by mouth 3 (three) times a week.   Yes [provider]  ?docusate sodium (COLACE) 100 MG capsule Take 100 mg by mouth 2 (two) times daily.   Yes [provider]  ?ferrous gluconate (FERGON) 240 (27 FE) MG tablet Take 240 mg by mouth daily.   Yes [provider]  ?FLUoxetine (PROZAC) 40 MG capsule Take one tablet each day for depression. ('40mg'$  ) 03/28/13  Yes Mashburn, Marlane Hatcher, PA-C  ?losartan (COZAAR) 25 MG tablet Take 1 tablet (25 mg total) by mouth daily. 02/04/21  Yes Evans Lance, MD  ?methocarbamol (ROBAXIN-750) 750 MG tablet Take 1 tablet (750 mg total) by mouth every 8 (eight) hours as needed for muscle spasms. 11/28/20  Yes Aggie Moats M, PA-C  ?metoprolol succinate (TOPROL XL) 100 MG 24 hr tablet Take 1 tablet (100 mg total) by mouth daily. Take with or immediately following a meal. 02/04/21  Yes Evans Lance, MD  ?mupirocin ointment (BACTROBAN) 2 % Apply 1 application. topically daily. 09/13/21  Yes Grayer Sproles,  Camryn Lampson K, PA-C  ?ondansetron (ZOFRAN ODT) 4 MG disintegrating tablet Take 1 tablet (4 mg total) by mouth 2 (two) times daily as needed for nausea or vomiting. 11/28/20  Yes Gawne, Trinda Pascal M, PA-C  ?oxyCODONE (ROXICODONE) 5 MG immediate release tablet Take 1 tablet (5 mg total) by mouth every 8 (eight) hours as needed. 11/28/20 11/28/21 Yes Gawne, Meghan M, PA-C  ?tamsulosin (FLOMAX) 0.4 MG CAPS capsule Take 0.4 mg by mouth in the morning and at bedtime. 08/10/20  Yes [provider]  ?vitamin B-12 (CYANOCOBALAMIN) 1000 MCG tablet Take 1,000 mcg by mouth daily.   Yes [provider]  ?warfarin (COUMADIN)  5 MG tablet Take one-half to one tablet by mouth once daily as directed by the anticoagulation clinic 07/16/21  Yes Evans Lance, MD  ?gabapentin (NEURONTIN) 300 MG capsule Take 1 capsule (300 mg total) by mouth 2 (two) times daily for 14 days. For 2 weeks post op for pain. 11/28/20 12/12/20  Britt Bottom, PA-C  ?omeprazole (PRILOSEC OTC) 20 MG tablet Take 1 tablet (20 mg total) by mouth daily. For gastric protection 11/28/20 12/28/20  Britt Bottom, PA-C  ? ? ?Family History ?Family History  ?Problem Relation Age of Onset  ? Coronary artery disease Mother   ? Cerebrovascular Disease Mother   ? Cancer Other   ? Anesthesia problems Neg Hx   ? Hypotension Neg Hx   ? Malignant hyperthermia Neg Hx   ? Pseudochol deficiency Neg Hx   ? ? ?Social History ?Social History  ? ?Tobacco Use  ? Smoking status: Never  ? Smokeless tobacco: Never  ?Vaping Use  ? Vaping Use: Never used  ?Substance Use Topics  ? Alcohol use: Not Currently  ?  Comment: rare use  ? Drug use: No  ? ? ? ?Allergies   ?Iohexol, Meperidine hcl, Demerol hcl [meperidine], and Prednisone ? ? ?Review of Systems ?Review of Systems  ?Constitutional:  Positive for activity change. Negative for appetite change, fatigue and fever.  ?Respiratory:  Negative for cough and shortness of breath.   ?Cardiovascular:  Negative for chest pain.  ?Gastrointestinal:   Negative for abdominal pain, diarrhea, nausea and vomiting.  ?Skin:  Positive for wound. Negative for color change.  ?Neurological:  Negative for dizziness, light-headedness and headaches.  ? ? ?Physical Exam ?Triage

## 2021-09-13 NOTE — ED Triage Notes (Signed)
Patient states that he injured the back of his right lower leg 8 days ago.  The skin is still on there and would not heal.  The area is painful and will not heal. ?

## 2021-09-13 NOTE — Discharge Instructions (Signed)
It appears you have an infection.  Please start cephalexin 4 times daily for 7 days.  Keep area clean with soap and water and apply Bactroban ointment.  As we discussed, any antibiotic can change your warfarin/INR.  If you develop any signs of bleeding including dark stools, bloody stools, blood in your urine, bruising you must go to the emergency room immediately.  Please follow-up with your PCP first thing next week for INR recheck and make sure they know you are prescribed an antibiotic.  If you develop any other symptoms including fever, increased pain, nausea, vomiting you need to be seen immediately. ?

## 2021-09-19 ENCOUNTER — Ambulatory Visit (INDEPENDENT_AMBULATORY_CARE_PROVIDER_SITE_OTHER): Payer: Medicare Other | Admitting: *Deleted

## 2021-09-19 DIAGNOSIS — Z7901 Long term (current) use of anticoagulants: Secondary | ICD-10-CM

## 2021-09-19 DIAGNOSIS — I480 Paroxysmal atrial fibrillation: Secondary | ICD-10-CM

## 2021-09-19 LAB — POCT INR: INR: 1.6 — AB (ref 2.0–3.0)

## 2021-09-19 NOTE — Patient Instructions (Signed)
Description   ?Today take 1.5 tablets then continue taking 1/2 tablet daily except for 1 tablet on Mondays and Thursdays. Recheck INR in 4 weeks (normally 6 weeks). Coumadin Clinic [phone].  ?  ?  ?

## 2021-10-24 ENCOUNTER — Ambulatory Visit (INDEPENDENT_AMBULATORY_CARE_PROVIDER_SITE_OTHER): Payer: Medicare Other

## 2021-10-24 DIAGNOSIS — Z7901 Long term (current) use of anticoagulants: Secondary | ICD-10-CM | POA: Diagnosis not present

## 2021-10-24 DIAGNOSIS — I480 Paroxysmal atrial fibrillation: Secondary | ICD-10-CM

## 2021-10-24 LAB — POCT INR: INR: 2.4 (ref 2.0–3.0)

## 2021-10-24 NOTE — Patient Instructions (Addendum)
Description   Continue taking 1/2 tablet daily except for 1 tablet on Mondays and Thursdays. Recheck INR in 6 weeks. Coumadin Clinic [phone].      

## 2021-11-27 ENCOUNTER — Ambulatory Visit (INDEPENDENT_AMBULATORY_CARE_PROVIDER_SITE_OTHER): Payer: Medicare Other

## 2021-11-27 DIAGNOSIS — I495 Sick sinus syndrome: Secondary | ICD-10-CM

## 2021-11-30 LAB — CUP PACEART REMOTE DEVICE CHECK
Battery Impedance: 1731 Ohm
Battery Remaining Longevity: 42 mo
Battery Voltage: 2.77 V
Brady Statistic AP VP Percent: 0 %
Brady Statistic AP VS Percent: 97 %
Brady Statistic AS VP Percent: 0 %
Brady Statistic AS VS Percent: 2 %
Date Time Interrogation Session: 20230707100600
Implantable Lead Implant Date: 20070112
Implantable Lead Implant Date: 20070112
Implantable Lead Location: 753859
Implantable Lead Location: 753860
Implantable Lead Model: 5076
Implantable Lead Model: 5076
Implantable Pulse Generator Implant Date: 20131018
Lead Channel Impedance Value: 501 Ohm
Lead Channel Impedance Value: 600 Ohm
Lead Channel Pacing Threshold Amplitude: 0.5 V
Lead Channel Pacing Threshold Amplitude: 1.125 V
Lead Channel Pacing Threshold Pulse Width: 0.4 ms
Lead Channel Pacing Threshold Pulse Width: 0.4 ms
Lead Channel Setting Pacing Amplitude: 2 V
Lead Channel Setting Pacing Amplitude: 2.5 V
Lead Channel Setting Pacing Pulse Width: 0.34 ms
Lead Channel Setting Sensing Sensitivity: 2.8 mV

## 2021-12-05 ENCOUNTER — Ambulatory Visit (INDEPENDENT_AMBULATORY_CARE_PROVIDER_SITE_OTHER): Payer: Medicare Other | Admitting: *Deleted

## 2021-12-05 DIAGNOSIS — I480 Paroxysmal atrial fibrillation: Secondary | ICD-10-CM | POA: Diagnosis not present

## 2021-12-05 DIAGNOSIS — Z7901 Long term (current) use of anticoagulants: Secondary | ICD-10-CM

## 2021-12-05 LAB — POCT INR: INR: 2.3 (ref 2.0–3.0)

## 2021-12-05 NOTE — Patient Instructions (Signed)
Description   Continue taking 1/2 tablet daily except for 1 tablet on Mondays and Thursdays. Recheck INR in 6 weeks. Coumadin Clinic [phone].

## 2021-12-18 NOTE — Progress Notes (Signed)
Remote pacemaker transmission.   

## 2021-12-24 ENCOUNTER — Ambulatory Visit
Admission: EM | Admit: 2021-12-24 | Discharge: 2021-12-24 | Disposition: A | Discharge status: Home / Self Care | Payer: Medicare Other | Attending: Physician Assistant | Admitting: Physician Assistant

## 2021-12-24 DIAGNOSIS — S61412A Laceration without foreign body of left hand, initial encounter: Secondary | ICD-10-CM

## 2021-12-24 NOTE — ED Provider Notes (Signed)
[name]    CSN: [id number] Arrival date & time: 12/24/21  1153      History   Chief Complaint Chief Complaint  Patient presents with   Wound Check    HPI [name] [name] is a 81 y.o. male.   Patient presents today with skin tear to his left hand that occurred yesterday when his chiropractor pulled on his hand. He had bleeding but this has resolved. He has kept wound covered.   The history is provided by the patient.    Past Medical History:  Diagnosis Date   Anemia    since post op- knee replacement    Anxiety    takes xanax 2 times per day, taken mostly for ringing in ears     Arthritis of knee, right    end-stage   Bronchitis    Bursitis    Complication of anesthesia    irritation in throat postop, makes a-fib worse    Depression    DJD (degenerative joint disease)    OA- "everywhere"   Dysrhythmia    Family history of anesthesia complication    sister with nausia and vomiting   Gout    Heart murmur    HTN (hypertension)    Hyperlipidemia    Migraines    Paroxysmal atrial fibrillation (HCC)    PONV (postoperative nausea and vomiting)    Presence of permanent cardiac pacemaker    Tinnitus     Patient Active Problem List   Diagnosis Date Noted   Orthostasis 04/04/2021   Hyponatremia 12/01/2020   S/P total knee arthroplasty, right 11/27/2020   Sick sinus syndrome ([place]) 08/03/2019   Major depressive disorder, recurrent episode, severe ([place]) 03/26/2013   Suicidal ideation 03/26/2013   Pain due to total left knee replacement ([place]) 08/09/2011   Long term current use of anticoagulant 07/15/2010   PPM-Medtronic 06/05/2009   HEADACHE 04/11/2009   HYPERTENSION, BENIGN 04/19/2008   Atrial fibrillation ([place]) 04/19/2008    Past Surgical History:  Procedure Laterality Date   APPENDECTOMY     Atrial fibrillation ablation  2009, 12/02/11   PVI by Dr Rayann Heman x 2   ATRIAL FIBRILLATION ABLATION N/A 12/02/2011   Procedure: ATRIAL FIBRILLATION  ABLATION;  Surgeon: Thompson Grayer, MD;  Location: [facility] CATH LAB;  Service: Cardiovascular;  Laterality: N/A;   bilteral thumb joint surgrery     CARDIAC CATHETERIZATION  2005    which revealed 40% stenosis of the mid left anterior descending artery., ablation- 2009   CHOLECYSTECTOMY     JOINT REPLACEMENT     L knee- 12/11,R hip- 1996   left rotator cuff repair     PACEMAKER INSERTION  06/06/2005   Medtronic EnRhythm dual-chamber. For sick sinus syndrome   PERMANENT PACEMAKER GENERATOR CHANGE N/A 03/12/2012   Procedure: PERMANENT PACEMAKER GENERATOR CHANGE;  Surgeon: Evans Lance, MD;  Location: [facility] CATH LAB;  Service: Cardiovascular;  Laterality: N/A;   RHINOPLASTY     right rotator cuff surgery  2006   right total hip arthroplasty     TEE WITHOUT CARDIOVERSION  12/01/2011   Procedure: TRANSESOPHAGEAL ECHOCARDIOGRAM (TEE);  Surgeon: Fay Records, MD;  Location: [place];  Service: Cardiovascular;  Laterality: N/A;  a-fib ablation following day   TONSILLECTOMY     as a child   TOTAL KNEE ARTHROPLASTY Right 11/27/2020   Procedure: TOTAL KNEE ARTHROPLASTY;  Surgeon: Renette Butters, MD;  Location: [facility] ORS;  Service: Orthopedics;  Laterality: Right;   TOTAL KNEE REVISION  08/08/2011   Procedure: TOTAL KNEE REVISION;  Surgeon: Kerin Salen, MD;  Location: [place];  Service: Orthopedics;  Laterality: Left;  DEPUY/SIGMA   TRANSESOPHAGEAL ECHOCARDIOGRAM  04/2008   TRANSTHORACIC ECHOCARDIOGRAM  4818,5631   uvulectomy         Home Medications    Prior to Admission medications   Medication Sig Start Date End Date Taking? Authorizing Provider  acetaminophen (TYLENOL) 500 MG tablet Take 2 tablets (1,000 mg total) by mouth every 6 (six) hours as needed for mild pain or moderate pain. 11/28/20   Britt Bottom, PA-C  ALPRAZolam Duanne Moron) 0.5 MG tablet Take 1 tablet (0.5 mg total) by mouth 3 (three) times daily as needed for anxiety. 03/28/13   Ruben Im, PA-C  Carboxymethylcellulose Sodium  (THERATEARS OP) Place 1 drop into both eyes at bedtime.    [provider]  cephALEXin (KEFLEX) 500 MG capsule Take 1 capsule (500 mg total) by mouth 4 (four) times daily. 09/13/21   Raspet, Derry Skill, PA-C  Cholecalciferol (DIALYVITE VITAMIN D 5000) 125 MCG (5000 UT) capsule Take 5,000 Units by mouth 3 (three) times a week.    [provider]  docusate sodium (COLACE) 100 MG capsule Take 100 mg by mouth 2 (two) times daily.    [provider]  ferrous gluconate (FERGON) 240 (27 FE) MG tablet Take 240 mg by mouth daily.    [provider]  FLUoxetine (PROZAC) 40 MG capsule Take one tablet each day for depression. ('40mg'$  ) 03/28/13   Mashburn, Milta Deiters T, PA-C  gabapentin (NEURONTIN) 300 MG capsule Take 1 capsule (300 mg total) by mouth 2 (two) times daily for 14 days. For 2 weeks post op for pain. 11/28/20 12/12/20  Britt Bottom, PA-C  losartan (COZAAR) 25 MG tablet Take 1 tablet (25 mg total) by mouth daily. 02/04/21   Evans Lance, MD  methocarbamol (ROBAXIN-750) 750 MG tablet Take 1 tablet (750 mg total) by mouth every 8 (eight) hours as needed for muscle spasms. 11/28/20   Britt Bottom, PA-C  metoprolol succinate (TOPROL XL) 100 MG 24 hr tablet Take 1 tablet (100 mg total) by mouth daily. Take with or immediately following a meal. 02/04/21   Evans Lance, MD  mupirocin ointment (BACTROBAN) 2 % Apply 1 application. topically daily. 09/13/21   Raspet, Derry Skill, PA-C  omeprazole (PRILOSEC OTC) 20 MG tablet Take 1 tablet (20 mg total) by mouth daily. For gastric protection 11/28/20 12/28/20  Aggie Moats M, PA-C  ondansetron (ZOFRAN ODT) 4 MG disintegrating tablet Take 1 tablet (4 mg total) by mouth 2 (two) times daily as needed for nausea or vomiting. 11/28/20   Britt Bottom, PA-C  tamsulosin (FLOMAX) 0.4 MG CAPS capsule Take 0.4 mg by mouth in the morning and at bedtime. 08/10/20   [provider]  vitamin B-12 (CYANOCOBALAMIN) 1000 MCG tablet Take 1,000 mcg by mouth  daily.    [provider]  warfarin (COUMADIN) 5 MG tablet Take one-half to one tablet by mouth once daily as directed by the anticoagulation clinic 07/16/21   Evans Lance, MD    Family History Family History  Problem Relation Age of Onset   Coronary artery disease Mother    Cerebrovascular Disease Mother    Cancer Other    Anesthesia problems Neg Hx    Hypotension Neg Hx    Malignant hyperthermia Neg Hx    Pseudochol deficiency Neg Hx     Social History Social  History   Tobacco Use   Smoking status: Never   Smokeless tobacco: Never  Vaping Use   Vaping Use: Never used  Substance Use Topics   Alcohol use: Not Currently    Comment: rare use   Drug use: No     Allergies   Iohexol, Meperidine hcl, Demerol hcl [meperidine], and Prednisone   Review of Systems Review of Systems  Constitutional:  Negative for chills and fever.  Eyes:  Negative for discharge and redness.  Skin:  Positive for color change and wound.  Neurological:  Negative for numbness.     Physical Exam Triage Vital Signs ED Triage Vitals  Enc Vitals Group     BP      Pulse      Resp      Temp      Temp src      SpO2      Weight      Height      Head Circumference      Peak Flow      Pain Score      Pain Loc      Pain Edu?      Excl. in [place]?    No data found.  Updated Vital Signs BP 123/79 (BP Location: Left Arm)   Pulse 79   Temp (!) 97.5 F (36.4 C) (Oral)   Resp 17   SpO2 95%       Physical Exam Vitals and nursing note reviewed.  Constitutional:      General: He is not in acute distress.    Appearance: Normal appearance. He is not ill-appearing.  HENT:     Head: Normocephalic and atraumatic.  Eyes:     Conjunctiva/sclera: Conjunctivae normal.  Cardiovascular:     Rate and Rhythm: Normal rate.  Pulmonary:     Effort: Pulmonary effort is normal.  Skin:    Comments: 2 v- shaped skin tears to left dorsal hand with no active bleeding, surrounding bruising noted   Neurological:     Mental Status: He is alert.  Psychiatric:        Mood and Affect: Mood normal.        Behavior: Behavior normal.        Thought Content: Thought content normal.      UC Treatments / Results  Labs (all labs ordered are listed, but only abnormal results are displayed) Labs Reviewed - No data to display  EKG   Radiology No results found.  Procedures Procedures (including critical care time)  Medications Ordered in UC Medications - No data to display  Initial Impression / Assessment and Plan / UC Course  I have reviewed the triage vital signs and the nursing notes.  Pertinent labs & imaging results that were available during my care of the patient were reviewed by me and considered in my medical decision making (see chart for details).    Skin tear bandaged in office and recommended he continue to use antibiotic ointment and nonstick dressings at home. Encouraged follow up with any concerns or signs of infection.   Final Clinical Impressions(s) / UC Diagnoses   Final diagnoses:  Skin tear of left hand without complication, initial encounter   Discharge Instructions   None    ED Prescriptions   None    PDMP not reviewed this encounter.   Francene Finders, PA-C 12/24/21 1303

## 2021-12-24 NOTE — ED Triage Notes (Signed)
Pt presents with skin tear on left hand from accidentally bumping into chiropractor yesterday.

## 2022-01-21 ENCOUNTER — Ambulatory Visit: Payer: Medicare Other | Attending: Cardiovascular Disease

## 2022-01-21 DIAGNOSIS — Z7901 Long term (current) use of anticoagulants: Secondary | ICD-10-CM | POA: Diagnosis not present

## 2022-01-21 LAB — POCT INR: INR: 1.6 — AB (ref 2.0–3.0)

## 2022-01-21 NOTE — Patient Instructions (Signed)
TAKE 1 TABLET TODAY ONLY and then Continue taking 1/2 tablet daily except for 1 tablet on Mondays and Thursdays. Recheck INR in 4 weeks. Coumadin Clinic [phone].

## 2022-01-27 ENCOUNTER — Other Ambulatory Visit: Payer: Self-pay | Admitting: Internal Medicine

## 2022-02-04 ENCOUNTER — Other Ambulatory Visit: Payer: Self-pay | Admitting: Internal Medicine

## 2022-02-17 ENCOUNTER — Ambulatory Visit: Payer: Medicare Other | Attending: Internal Medicine

## 2022-02-17 DIAGNOSIS — Z7901 Long term (current) use of anticoagulants: Secondary | ICD-10-CM | POA: Insufficient documentation

## 2022-02-17 DIAGNOSIS — I480 Paroxysmal atrial fibrillation: Secondary | ICD-10-CM | POA: Diagnosis not present

## 2022-02-17 LAB — POCT INR: INR: 1.9 — AB (ref 2.0–3.0)

## 2022-02-17 NOTE — Patient Instructions (Signed)
Description   Take 1.5 tablets today and then START taking 1/2 tablet daily except for 1 tablet on Mondays, Thursdays, and Saturdays. Recheck INR in 2 weeks.  Coumadin Clinic [phone].

## 2022-02-25 ENCOUNTER — Ambulatory Visit (INDEPENDENT_AMBULATORY_CARE_PROVIDER_SITE_OTHER): Payer: Medicare Other

## 2022-02-25 DIAGNOSIS — I495 Sick sinus syndrome: Secondary | ICD-10-CM

## 2022-02-26 LAB — CUP PACEART REMOTE DEVICE CHECK
Battery Impedance: 1820 Ohm
Battery Remaining Longevity: 40 mo
Battery Voltage: 2.76 V
Brady Statistic AP VP Percent: 0 %
Brady Statistic AP VS Percent: 98 %
Brady Statistic AS VP Percent: 0 %
Brady Statistic AS VS Percent: 2 %
Date Time Interrogation Session: 20231004105548
Implantable Lead Implant Date: 20070112
Implantable Lead Implant Date: 20070112
Implantable Lead Location: 753859
Implantable Lead Location: 753860
Implantable Lead Model: 5076
Implantable Lead Model: 5076
Implantable Pulse Generator Implant Date: 20131018
Lead Channel Impedance Value: 487 Ohm
Lead Channel Impedance Value: 642 Ohm
Lead Channel Pacing Threshold Amplitude: 0.625 V
Lead Channel Pacing Threshold Amplitude: 1.125 V
Lead Channel Pacing Threshold Pulse Width: 0.4 ms
Lead Channel Pacing Threshold Pulse Width: 0.4 ms
Lead Channel Setting Pacing Amplitude: 2 V
Lead Channel Setting Pacing Amplitude: 2.5 V
Lead Channel Setting Pacing Pulse Width: 0.34 ms
Lead Channel Setting Sensing Sensitivity: 2.8 mV

## 2022-03-03 ENCOUNTER — Ambulatory Visit: Payer: Medicare Other | Attending: Internal Medicine

## 2022-03-03 DIAGNOSIS — I480 Paroxysmal atrial fibrillation: Secondary | ICD-10-CM

## 2022-03-03 DIAGNOSIS — Z7901 Long term (current) use of anticoagulants: Secondary | ICD-10-CM | POA: Diagnosis not present

## 2022-03-03 LAB — POCT INR: INR: 2.3 (ref 2.0–3.0)

## 2022-03-03 NOTE — Patient Instructions (Signed)
Description   Continue taking 1/2 tablet daily except for 1 tablet on Mondays, Thursdays, and Saturdays. Recheck INR in 3 weeks.  Coumadin Clinic [phone].

## 2022-03-07 ENCOUNTER — Other Ambulatory Visit: Payer: Self-pay | Admitting: Internal Medicine

## 2022-03-10 NOTE — Progress Notes (Signed)
Remote pacemaker transmission.   

## 2022-03-24 ENCOUNTER — Ambulatory Visit: Payer: Medicare Other | Attending: Internal Medicine | Admitting: *Deleted

## 2022-03-24 DIAGNOSIS — Z7901 Long term (current) use of anticoagulants: Secondary | ICD-10-CM | POA: Diagnosis not present

## 2022-03-24 DIAGNOSIS — I480 Paroxysmal atrial fibrillation: Secondary | ICD-10-CM

## 2022-03-24 LAB — POCT INR: INR: 4 — AB (ref 2.0–3.0)

## 2022-03-24 NOTE — Patient Instructions (Signed)
Description   Do not take any warfarin tomorrow then continue taking 1/2 tablet daily except for 1 tablet on Mondays, Thursdays, and Saturdays. Recheck INR in 2 weeks.  Coumadin Clinic [phone] or [phone]

## 2022-04-08 ENCOUNTER — Ambulatory Visit: Payer: Medicare Other | Attending: Internal Medicine | Admitting: *Deleted

## 2022-04-08 DIAGNOSIS — Z7901 Long term (current) use of anticoagulants: Secondary | ICD-10-CM

## 2022-04-08 DIAGNOSIS — I480 Paroxysmal atrial fibrillation: Secondary | ICD-10-CM | POA: Diagnosis not present

## 2022-04-08 LAB — POCT INR: INR: 4 — AB (ref 2.0–3.0)

## 2022-04-08 NOTE — Patient Instructions (Signed)
Description   Do not take any warfarin today then start taking 1/2 tablet daily except for 1 tablet on Mondays and Thursdays. Recheck INR in 2 weeks.  Coumadin Clinic [phone] or [phone]

## 2022-04-15 ENCOUNTER — Other Ambulatory Visit: Payer: Self-pay | Admitting: Internal Medicine

## 2022-04-15 DIAGNOSIS — I480 Paroxysmal atrial fibrillation: Secondary | ICD-10-CM

## 2022-04-15 NOTE — Telephone Encounter (Signed)
Last INR 04/08/2022 Last OV 04/04/2021

## 2022-04-23 ENCOUNTER — Ambulatory Visit: Payer: Medicare Other | Attending: Internal Medicine | Admitting: *Deleted

## 2022-04-23 DIAGNOSIS — I480 Paroxysmal atrial fibrillation: Secondary | ICD-10-CM | POA: Diagnosis not present

## 2022-04-23 DIAGNOSIS — Z7901 Long term (current) use of anticoagulants: Secondary | ICD-10-CM | POA: Diagnosis not present

## 2022-04-23 LAB — POCT INR: INR: 2.2 (ref 2.0–3.0)

## 2022-04-23 NOTE — Patient Instructions (Signed)
Description   Continue taking warfarin 1/2 tablet daily except for 1 tablet on Mondays and Thursdays. Recheck INR in 4 weeks. Coumadin Clinic [phone] or [phone]

## 2022-05-01 ENCOUNTER — Other Ambulatory Visit: Payer: Self-pay | Admitting: Internal Medicine

## 2022-05-01 NOTE — Telephone Encounter (Signed)
Rx refill sent to pharmacy. 

## 2022-05-20 ENCOUNTER — Ambulatory Visit: Payer: Medicare Other | Attending: Internal Medicine

## 2022-05-20 DIAGNOSIS — Z5181 Encounter for therapeutic drug level monitoring: Secondary | ICD-10-CM | POA: Diagnosis not present

## 2022-05-20 DIAGNOSIS — Z7901 Long term (current) use of anticoagulants: Secondary | ICD-10-CM

## 2022-05-20 LAB — POCT INR: INR: 1.9 — AB (ref 2.0–3.0)

## 2022-05-20 NOTE — Patient Instructions (Signed)
Continue taking warfarin 1/2 tablet daily except for 1 tablet on Mondays and Thursdays. Recheck INR in 4 weeks. Coumadin Clinic [phone] or [phone]

## 2022-05-27 ENCOUNTER — Ambulatory Visit (INDEPENDENT_AMBULATORY_CARE_PROVIDER_SITE_OTHER): Payer: Medicare Other

## 2022-05-27 DIAGNOSIS — I495 Sick sinus syndrome: Secondary | ICD-10-CM

## 2022-05-28 LAB — CUP PACEART REMOTE DEVICE CHECK
Battery Impedance: 1876 Ohm
Battery Remaining Longevity: 39 mo
Battery Voltage: 2.77 V
Brady Statistic AP VP Percent: 0 %
Brady Statistic AP VS Percent: 98 %
Brady Statistic AS VP Percent: 0 %
Brady Statistic AS VS Percent: 2 %
Date Time Interrogation Session: 20240102151438
Implantable Lead Connection Status: 753985
Implantable Lead Connection Status: 753985
Implantable Lead Implant Date: 20070112
Implantable Lead Implant Date: 20070112
Implantable Lead Location: 753859
Implantable Lead Location: 753860
Implantable Lead Model: 5076
Implantable Lead Model: 5076
Implantable Pulse Generator Implant Date: 20131018
Lead Channel Impedance Value: 501 Ohm
Lead Channel Impedance Value: 613 Ohm
Lead Channel Pacing Threshold Amplitude: 0.5 V
Lead Channel Pacing Threshold Amplitude: 1.125 V
Lead Channel Pacing Threshold Pulse Width: 0.4 ms
Lead Channel Pacing Threshold Pulse Width: 0.4 ms
Lead Channel Setting Pacing Amplitude: 2 V
Lead Channel Setting Pacing Amplitude: 2.5 V
Lead Channel Setting Pacing Pulse Width: 0.34 ms
Lead Channel Setting Sensing Sensitivity: 2.8 mV
Zone Setting Status: 755011
Zone Setting Status: 755011

## 2022-06-17 ENCOUNTER — Ambulatory Visit: Payer: Medicare Other | Attending: Cardiology

## 2022-06-17 DIAGNOSIS — Z7901 Long term (current) use of anticoagulants: Secondary | ICD-10-CM | POA: Diagnosis not present

## 2022-06-17 DIAGNOSIS — I480 Paroxysmal atrial fibrillation: Secondary | ICD-10-CM | POA: Diagnosis not present

## 2022-06-17 LAB — POCT INR: INR: 2.3 (ref 2.0–3.0)

## 2022-06-17 NOTE — Patient Instructions (Signed)
Description   Continue taking warfarin 1/2 tablet daily except for 1 tablet on Mondays and Thursdays. Recheck INR in 5 weeks. Coumadin Clinic [phone] or [phone]

## 2022-06-27 ENCOUNTER — Encounter: Payer: Self-pay | Admitting: Internal Medicine

## 2022-06-27 ENCOUNTER — Ambulatory Visit: Payer: Medicare Other | Attending: Internal Medicine | Admitting: Internal Medicine

## 2022-06-27 VITALS — BP 132/76 | HR 81 | Ht 66.0 in | Wt 172.2 lb

## 2022-06-27 DIAGNOSIS — I4891 Unspecified atrial fibrillation: Secondary | ICD-10-CM | POA: Diagnosis present

## 2022-06-27 DIAGNOSIS — Z950 Presence of cardiac pacemaker: Secondary | ICD-10-CM | POA: Diagnosis present

## 2022-06-27 DIAGNOSIS — I495 Sick sinus syndrome: Secondary | ICD-10-CM

## 2022-06-27 NOTE — Patient Instructions (Signed)
Medication Instructions:  Your physician recommends that you continue on your current medications as directed. Please refer to the Current Medication list given to you today.  *If you need a refill on your cardiac medications before your next appointment, please call your pharmacy*  Lab Work: None ordered.  If you have labs (blood work) drawn today and your tests are completely normal, you will receive your results only by: [place] (if you have MyChart) OR A paper copy in the mail If you have any lab test that is abnormal or we need to change your treatment, we will call you to review the results.  Testing/Procedures: None ordered.  Follow-Up: At [facility], you and your health needs are our priority.  As part of our continuing mission to provide you with exceptional heart care, we have created designated Provider Care Teams.  These Care Teams include your primary Cardiologist (physician) and Advanced Practice Providers (APPs -  Physician Assistants and Nurse Practitioners) who all work together to provide you with the care you need, when you need it.  We recommend signing up for the patient portal called "MyChart".  Sign up information is provided on this After Visit Summary.  MyChart is used to connect with patients for Virtual Visits (Telemedicine).  Patients are able to view lab/test results, encounter notes, upcoming appointments, etc.  Non-urgent messages can be sent to your provider as well.   To learn more about what you can do with MyChart, go to [email or web address].    Your next appointment:   1 year(s)  The format for your next appointment:   In Person  Provider:   Cristopher [place], MD{or one of the following Advanced Practice Providers on your designated Care Team:   Tommye Standard, [place] Legrand Como "[name]" Chalmers Cater, [place]  Remote monitoring is used to monitor your Pacemaker from home. This monitoring reduces the number of office visits required to check your device to  one time per year. It allows [place] to keep an eye on the functioning of your device to ensure it is working properly. You are scheduled for a device check from home on 08/26/2022. You may send your transmission at any time that day. If you have a wireless device, the transmission will be sent automatically. After your physician reviews your transmission, you will receive a postcard with your next transmission date.  Important Information About [place]

## 2022-06-27 NOTE — Progress Notes (Signed)
HPI Mr. Goens returns today for followup of his PAF. He is a pleasant 82 yo man with a h/o PAF, s/p catheter ablation, and sinus node dysfunction s/p PPM insertion. He has lost weight. He has done well in the interim and his atrial fib burden has improved. He denies chest pain or sob. Since I last saw him he has been in the hospital with post op atrial fib and a RVR. He was started back on metoprolol.   He has been bothered by knee pain after knee replacement surgery. Allergies  Allergen Reactions   Iohexol Hives    POST MYELOGRAM.  PT ALSO SAID THIS HAPPENED A YEAR AGO WITH A MYELOGRAM AND WITH ESI'S., Onset Date: [id number]    Meperidine Hcl Other (See Comments)    hallucinations   Demerol Hcl [Meperidine] Other (See Comments)    Hallucinations   Prednisone Other (See Comments)    Redness of skin & jittery     Current Outpatient Medications  Medication Sig Dispense Refill   ALPRAZolam (XANAX) 0.5 MG tablet Take 1 tablet (0.5 mg total) by mouth 3 (three) times daily as needed for anxiety. 90 tablet 0   Carboxymethylcellulose Sodium (THERATEARS OP) Place 1 drop into both eyes at bedtime.     ferrous gluconate (FERGON) 240 (27 FE) MG tablet Take 240 mg by mouth daily.     FLUoxetine (PROZAC) 40 MG capsule Take one tablet each day for depression. ('40mg'$  ) 30 capsule 0   losartan (COZAAR) 25 MG tablet Take 1 tablet (25 mg total) by mouth daily. 90 tablet 3   methocarbamol (ROBAXIN-750) 750 MG tablet Take 1 tablet (750 mg total) by mouth every 8 (eight) hours as needed for muscle spasms. 20 tablet 0   metoprolol succinate (TOPROL-XL) 100 MG 24 hr tablet Take 1 tablet (100 mg total) by mouth daily. TAKE 1 TABLET BY MOUTH ONCE DAILY WITH  OR  IMMEDIATELY  FOLLOWING  A  MEAL. 90 tablet 0   mupirocin ointment (BACTROBAN) 2 % Apply 1 application. topically daily. 22 g 0   ondansetron (ZOFRAN ODT) 4 MG disintegrating tablet Take 1 tablet (4 mg total) by mouth 2 (two) times daily as needed  for nausea or vomiting. 10 tablet 0   tamsulosin (FLOMAX) 0.4 MG CAPS capsule Take 0.4 mg by mouth in the morning and at bedtime.     vitamin B-12 (CYANOCOBALAMIN) 1000 MCG tablet Take 1,000 mcg by mouth daily.     warfarin (COUMADIN) 5 MG tablet TAKE 1/2 TO 1 (ONE-HALF TO ONE) TABLET BY MOUTH ONCE DAILY AS  DIRECTED  BY  ANTICOAGULATION  CLINIC 30 tablet 3   gabapentin (NEURONTIN) 300 MG capsule Take 1 capsule (300 mg total) by mouth 2 (two) times daily for 14 days. For 2 weeks post op for pain. 28 capsule 0   omeprazole (PRILOSEC OTC) 20 MG tablet Take 1 tablet (20 mg total) by mouth daily. For gastric protection 30 tablet 0   No current facility-administered medications for this visit.     Past Medical History:  Diagnosis Date   Anemia    since post op- knee replacement    Anxiety    takes xanax 2 times per day, taken mostly for ringing in ears     Arthritis of knee, right    end-stage   Bronchitis    Bursitis    Complication of anesthesia    irritation in throat postop, makes a-fib worse    Depression  DJD (degenerative joint disease)    OA- "everywhere"   Dysrhythmia    Family history of anesthesia complication    sister with nausia and vomiting   Gout    Heart murmur    HTN (hypertension)    Hyperlipidemia    Migraines    Paroxysmal atrial fibrillation (HCC)    PONV (postoperative nausea and vomiting)    Presence of permanent cardiac pacemaker    Tinnitus     ROS:   All systems reviewed and negative except as noted in the HPI.   Past Surgical History:  Procedure Laterality Date   APPENDECTOMY     Atrial fibrillation ablation  2009, 12/02/11   PVI by Dr Rayann Heman x 2   ATRIAL FIBRILLATION ABLATION N/A 12/02/2011   Procedure: ATRIAL FIBRILLATION ABLATION;  Surgeon: Thompson Grayer, MD;  Location: [facility] CATH LAB;  Service: Cardiovascular;  Laterality: N/A;   bilteral thumb joint surgrery     CARDIAC CATHETERIZATION  2005    which revealed 40% stenosis of the mid left  anterior descending artery., ablation- 2009   CHOLECYSTECTOMY     JOINT REPLACEMENT     L knee- 12/11,R hip- 1996   left rotator cuff repair     PACEMAKER INSERTION  06/06/2005   Medtronic EnRhythm dual-chamber. For sick sinus syndrome   PERMANENT PACEMAKER GENERATOR CHANGE N/A 03/12/2012   Procedure: PERMANENT PACEMAKER GENERATOR CHANGE;  Surgeon: Evans Lance, MD;  Location: [facility] CATH LAB;  Service: Cardiovascular;  Laterality: N/A;   RHINOPLASTY     right rotator cuff surgery  2006   right total hip arthroplasty     TEE WITHOUT CARDIOVERSION  12/01/2011   Procedure: TRANSESOPHAGEAL ECHOCARDIOGRAM (TEE);  Surgeon: Fay Records, MD;  Location: [place];  Service: Cardiovascular;  Laterality: N/A;  a-fib ablation following day   TONSILLECTOMY     as a child   TOTAL KNEE ARTHROPLASTY Right 11/27/2020   Procedure: TOTAL KNEE ARTHROPLASTY;  Surgeon: Renette Butters, MD;  Location: [facility];  Service: Orthopedics;  Laterality: Right;   TOTAL KNEE REVISION  08/08/2011   Procedure: TOTAL KNEE REVISION;  Surgeon: Kerin Salen, MD;  Location: [place];  Service: Orthopedics;  Laterality: Left;  DEPUY/SIGMA   TRANSESOPHAGEAL ECHOCARDIOGRAM  04/2008   TRANSTHORACIC ECHOCARDIOGRAM  2006,2009   uvulectomy       Family History  Problem Relation Age of Onset   Coronary artery disease Mother    Cerebrovascular Disease Mother    Cancer Other    Anesthesia problems Neg Hx    Hypotension Neg Hx    Malignant hyperthermia Neg Hx    Pseudochol deficiency Neg Hx      Social History   Socioeconomic History   Marital status: Married    Spouse name: Not on file   Number of children: 2   Years of education: Not on file   Highest education level: Not on file  Occupational History   Occupation: retired    Fish farm manager: RETIRED  Tobacco Use   Smoking status: Never   Smokeless tobacco: Never  Vaping Use   Vaping Use: Never used  Substance and Sexual Activity   Alcohol use: Not Currently    Comment:  rare use   Drug use: No   Sexual activity: Not on file  Other Topics Concern   Not on file  Social History Narrative   Not on file   Social Determinants of Health   Financial Resource Strain: Not on file  Food Insecurity:  Not on file  Transportation Needs: Not on file  Physical Activity: Not on file  Stress: Not on file  Social Connections: Not on file  Intimate Partner Violence: Not on file     BP 132/76   Pulse 81   Ht '5\' 6"'$  (1.676 m)   Wt 172 lb 3.2 oz (78.1 kg)   SpO2 99%   BMI 27.79 kg/m   Physical Exam:  Well appearing NAD HEENT: Unremarkable Neck:  No JVD, no thyromegally Lymphatics:  No adenopathy Back:  No CVA tenderness Lungs:  Clear with no wheezes HEART:  Regular rate rhythm, no murmurs, no rubs, no clicks Abd:  soft, positive bowel sounds, no organomegally, no rebound, no guarding Ext:  2 plus pulses, no edema, no cyanosis, no clubbing Skin:  No rashes no nodules Neuro:  CN II through XII intact, motor grossly intact  EKG - nsr with atrial pacing  DEVICE  Normal device function.  See PaceArt for details.   Assess/Plan: 1. Sinus node dysfunction - he is asymptomatic, s/p PPM insertion. No escape today. 2. PAF - he is maintaining NSR about 97% of the time.  3. HTN - his bp is up a bit but is usually better at home. 4. PPM - his medtronic DDD PM is working [employer] and has about 3 years of battery longevity.   Carleene Overlie Taleshia Luff,MD

## 2022-06-27 NOTE — Progress Notes (Signed)
Remote pacemaker transmission.   

## 2022-07-22 ENCOUNTER — Ambulatory Visit: Payer: Medicare Other | Attending: Internal Medicine

## 2022-07-22 DIAGNOSIS — Z7901 Long term (current) use of anticoagulants: Secondary | ICD-10-CM | POA: Insufficient documentation

## 2022-07-22 DIAGNOSIS — I480 Paroxysmal atrial fibrillation: Secondary | ICD-10-CM | POA: Diagnosis not present

## 2022-07-22 LAB — POCT INR: INR: 2.3 (ref 2.0–3.0)

## 2022-07-22 NOTE — Patient Instructions (Signed)
Description   Continue taking warfarin 1/2 tablet daily except for 1 tablet on Mondays and Thursdays. Recheck INR in 6 weeks. Coumadin Clinic [phone] or [phone]

## 2022-08-12 ENCOUNTER — Other Ambulatory Visit: Payer: Self-pay | Admitting: Internal Medicine

## 2022-08-26 ENCOUNTER — Ambulatory Visit (INDEPENDENT_AMBULATORY_CARE_PROVIDER_SITE_OTHER): Payer: Medicare Other

## 2022-08-26 DIAGNOSIS — I495 Sick sinus syndrome: Secondary | ICD-10-CM

## 2022-08-26 LAB — CUP PACEART REMOTE DEVICE CHECK
Battery Impedance: 2082 Ohm
Battery Remaining Longevity: 35 mo
Battery Voltage: 2.76 V
Brady Statistic AP VP Percent: 0 %
Brady Statistic AP VS Percent: 98 %
Brady Statistic AS VP Percent: 0 %
Brady Statistic AS VS Percent: 2 %
Date Time Interrogation Session: 20240402102214
Implantable Lead Connection Status: 753985
Implantable Lead Connection Status: 753985
Implantable Lead Implant Date: 20070112
Implantable Lead Implant Date: 20070112
Implantable Lead Location: 753859
Implantable Lead Location: 753860
Implantable Lead Model: 5076
Implantable Lead Model: 5076
Implantable Pulse Generator Implant Date: 20131018
Lead Channel Impedance Value: 469 Ohm
Lead Channel Impedance Value: 620 Ohm
Lead Channel Pacing Threshold Amplitude: 0.625 V
Lead Channel Pacing Threshold Amplitude: 1.25 V
Lead Channel Pacing Threshold Pulse Width: 0.4 ms
Lead Channel Pacing Threshold Pulse Width: 0.4 ms
Lead Channel Setting Pacing Amplitude: 2 V
Lead Channel Setting Pacing Amplitude: 2.5 V
Lead Channel Setting Pacing Pulse Width: 0.34 ms
Lead Channel Setting Sensing Sensitivity: 2.8 mV
Zone Setting Status: 755011
Zone Setting Status: 755011

## 2022-08-28 ENCOUNTER — Other Ambulatory Visit: Payer: Self-pay | Admitting: Physician Assistant

## 2022-08-28 DIAGNOSIS — M5410 Radiculopathy, site unspecified: Secondary | ICD-10-CM

## 2022-09-02 ENCOUNTER — Ambulatory Visit: Payer: Medicare Other | Attending: Internal Medicine | Admitting: *Deleted

## 2022-09-02 DIAGNOSIS — Z7901 Long term (current) use of anticoagulants: Secondary | ICD-10-CM | POA: Diagnosis present

## 2022-09-02 DIAGNOSIS — I480 Paroxysmal atrial fibrillation: Secondary | ICD-10-CM | POA: Insufficient documentation

## 2022-09-02 LAB — POCT INR: INR: 2.5 (ref 2.0–3.0)

## 2022-09-02 NOTE — Patient Instructions (Signed)
Description   Continue taking warfarin 1/2 tablet daily except for 1 tablet on Mondays and Thursdays. Recheck INR in 7 weeks. Coumadin Clinic [phone] or [phone]

## 2022-09-10 ENCOUNTER — Ambulatory Visit
Admission: RE | Admit: 2022-09-10 | Discharge: 2022-09-10 | Disposition: A | Discharge status: Home / Self Care | Payer: Medicare Other | Source: Ambulatory Visit | Attending: Physician Assistant | Admitting: Physician Assistant

## 2022-09-10 DIAGNOSIS — M5410 Radiculopathy, site unspecified: Secondary | ICD-10-CM

## 2022-10-02 ENCOUNTER — Other Ambulatory Visit: Payer: Medicare Other

## 2022-10-07 NOTE — Progress Notes (Signed)
Remote pacemaker transmission.   

## 2022-10-21 ENCOUNTER — Ambulatory Visit: Payer: Medicare Other

## 2022-10-24 ENCOUNTER — Telehealth: Payer: Self-pay | Admitting: *Deleted

## 2022-10-24 ENCOUNTER — Ambulatory Visit: Payer: Medicare Other | Attending: Cardiology

## 2022-10-24 DIAGNOSIS — I480 Paroxysmal atrial fibrillation: Secondary | ICD-10-CM

## 2022-10-24 DIAGNOSIS — Z7901 Long term (current) use of anticoagulants: Secondary | ICD-10-CM

## 2022-10-24 LAB — POCT INR: INR: 2.6 (ref 2.0–3.0)

## 2022-10-24 NOTE — Patient Instructions (Signed)
Continue taking warfarin 1/2 tablet daily except for 1 tablet on Mondays and Thursdays. Recheck INR in 1 week. Coumadin Clinic [phone] or [phone];  Back Injection 6/7, HOLDING Coumadin 6/2-6/6;  Will try to get Clearance Faxed today.

## 2022-10-24 NOTE — Telephone Encounter (Signed)
   Pre-operative Risk Assessment    Patient Name: [name] [name]  DOB: [birth year] MRN: [id number]   REQUEST STATES MEDICATION CLEARANCE/RECOMMENDATIONS REQUEST FORM   Request for Surgical Clearance    Procedure:   RIGHT L4-L5, L5-S1 TFESI INJECTION  Date of Surgery:  Clearance 10/31/22                                 Surgeon:  DR. Mila Palmer Surgeon's Group or Practice Name:  [facility] Phone number:  [phone] Fax number:  [phone]   Type of Clearance Requested:   - Pharmacy:  Hold Warfarin (Coumadin) x 5 DAYS PRIOR   Type of Anesthesia:  Not Indicated   Additional requests/questions:    Elpidio Anis   10/24/2022, 12:29 PM

## 2022-10-26 NOTE — Telephone Encounter (Signed)
No record of BMET or CBC in system (Epic, Care Everywhere or [facility]) since 2022.  Will need updated blood work for clearance

## 2022-10-27 NOTE — Telephone Encounter (Signed)
Dr. Verdie Mosher office has been called and aware of all notes and that I will fax over notes only as FYI that the pt is needing labs to be done. Our office did only receive fax on 10/24/22. I explained to Dr. Verdie Mosher office that these are cardiac pt's who mostly are on blood thinners and need appt's. Really was not enough time for cardiology to be able to make an appropriate assessment.

## 2022-10-27 NOTE — Telephone Encounter (Signed)
Lab orders have been placed. I s/w the pt and informed him that he needs labs to be done before he could be cleared. Pt states he was first going to wait until 10/30/22 to get labs done when he came in for his coumadin appt.  I did tell him that I s/w the requesting office and explained that the pt needed labs in order for pre op pharm-d to make a safe recommendation as to a hold time for Coumadin. Pt became upset and said he has been waiting for 3 months for this and that he is in pain. I explained that is it is not our goal to delay anyone's procedure/surgery, but it is our job to make sure that we protect the pt and their heart, as well as establish a safe guideline when holding blood thinners.   Pt has decided that he will go get labs this afternoon at the [facility] office, lab orders have been placed. I will update all parties involved. Nothing has been noted if the pt will need a tele appt once we have lab results. Pt is upset and says he did not know that because he sees a cardiologist now that things are so different if needing procedure/surgery. I explained that yes that is the case, as it is important to always address any procedure/surgery with the cardiologist.

## 2022-10-27 NOTE — Telephone Encounter (Signed)
Alisha with the spine center is calling to confirm if the injection will have to be postponed.   She states to press option 5 then option 2 to speak with someone when calling back.   Please advise.

## 2022-10-28 ENCOUNTER — Telehealth: Payer: Self-pay

## 2022-10-28 LAB — CBC
Hematocrit: 42.3 % (ref 37.5–51.0)
Hemoglobin: 13.8 g/dL (ref 13.0–17.7)
MCH: 29.7 pg (ref 26.6–33.0)
MCHC: 32.6 g/dL (ref 31.5–35.7)
MCV: 91 fL (ref 79–97)
Platelets: 198 10*3/uL (ref 150–450)
RBC: 4.65 x10E6/uL (ref 4.14–5.80)
RDW: 13.5 % (ref 11.6–15.4)
WBC: 5.4 10*3/uL (ref 3.4–10.8)

## 2022-10-28 LAB — BASIC METABOLIC PANEL
BUN/Creatinine Ratio: 14 (ref 10–24)
BUN: 14 mg/dL (ref 8–27)
CO2: 26 mmol/L (ref 20–29)
Calcium: 8.9 mg/dL (ref 8.6–10.2)
Chloride: 102 mmol/L (ref 96–106)
Creatinine, Ser: 0.97 mg/dL (ref 0.76–1.27)
Glucose: 105 mg/dL — ABNORMAL HIGH (ref 70–99)
Potassium: 4.5 mmol/L (ref 3.5–5.2)
Sodium: 139 mmol/L (ref 134–144)
eGFR: 77 mL/min/{1.73_m2} (ref >59)

## 2022-10-28 NOTE — Telephone Encounter (Signed)
Spoke with patient who is agreeable to do a tele visit on 6/6 at 1:40 pm. Med rec and consent done.

## 2022-10-28 NOTE — Telephone Encounter (Signed)
Patient with diagnosis of atrial fibrillation on warfarin for anticoagulation.    Procedure:   RIGHT L4-L5, L5-S1 TFESI INJECTION   Date of Surgery:  Clearance 10/31/22        CHA2DS2-VASc Score = 3   This indicates a 3.2% annual risk of stroke. The patient's score is based upon: CHF History: 0 HTN History: 1 Diabetes History: 0 Stroke History: 0 Vascular Disease History: 0 Age Score: 2 Gender Score: 0    CrCl 64 Platelet count 198  Per office protocol, patient can hold warfarin for 5 days prior to procedure.   Patient will not need bridging with Lovenox (enoxaparin) around procedure.  Coumadin clinic has been made aware and will discuss with patient at next appt  **This guidance is not considered finalized until pre-operative APP has relayed final recommendations.**

## 2022-10-28 NOTE — Telephone Encounter (Signed)
   Name: [name] [name]  DOB: 29-Sep-1940  MRN: [id number]  Primary Cardiologist: Lewayne Bunting, MD   Preoperative team, please contact this patient and set up a phone call appointment for further preoperative risk assessment. Please obtain consent and complete medication review. Thank you for your help.  I confirm that guidance regarding antiplatelet and oral anticoagulation therapy has been completed and, if necessary, noted below. Per Pharm D: Patient with diagnosis of atrial fibrillation on warfarin for anticoagulation.     Procedure:   RIGHT L4-L5, L5-S1 TFESI INJECTION   Date of Surgery:  Clearance 10/31/22          CHA2DS2-VASc Score = 3   This indicates a 3.2% annual risk of stroke. The patient's score is based upon: CHF History: 0 HTN History: 1 Diabetes History: 0 Stroke History: 0 Vascular Disease History: 0 Age Score: 2 Gender Score: 0     CrCl 64 Platelet count 198   Per office protocol, patient can hold warfarin for 5 days prior to procedure.   Patient will not need bridging with Lovenox (enoxaparin) around procedure.   Coumadin clinic has been made aware and will discuss with patient at next appt     Carlos Levering, NP 10/28/2022, 8:51 AM [facility]

## 2022-10-28 NOTE — Telephone Encounter (Signed)
  Patient Consent for Virtual Visit        [name] [name] has provided verbal consent on 10/28/2022 for a virtual visit (video or telephone).   CONSENT FOR VIRTUAL VISIT FOR:  [name] [name]  By participating in this virtual visit I agree to the following:  I hereby voluntarily request, consent and authorize [facility] and its employed or contracted physicians, physician assistants, nurse practitioners or other licensed health care professionals (the Practitioner), to provide me with telemedicine health care services (the "Services") as deemed necessary by the treating Practitioner. I acknowledge and consent to receive the Services by the Practitioner via telemedicine. I understand that the telemedicine visit will involve communicating with the Practitioner through live audiovisual communication technology and the disclosure of certain medical information by electronic transmission. I acknowledge that I have been given the opportunity to request an in-person assessment or other available alternative prior to the telemedicine visit and am voluntarily participating in the telemedicine visit.  I understand that I have the right to withhold or withdraw my consent to the use of telemedicine in the course of my care at any time, without affecting my right to future care or treatment, and that the Practitioner or I may terminate the telemedicine visit at any time. I understand that I have the right to inspect all information obtained and/or recorded in the course of the telemedicine visit and may receive copies of available information for a reasonable fee.  I understand that some of the potential risks of receiving the Services via telemedicine include:  Delay or interruption in medical evaluation due to technological equipment failure or disruption; Information transmitted may not be sufficient (e.g. poor resolution of images) to allow for appropriate medical decision making by the  Practitioner; and/or  In rare instances, security protocols could fail, causing a breach of personal health information.  Furthermore, I acknowledge that it is my responsibility to provide information about my medical history, conditions and care that is complete and accurate to the best of my ability. I acknowledge that Practitioner's advice, recommendations, and/or decision may be based on factors not within their control, such as incomplete or inaccurate data provided by me or distortions of diagnostic images or specimens that may result from electronic transmissions. I understand that the practice of medicine is not an exact science and that Practitioner makes no warranties or guarantees regarding treatment outcomes. I acknowledge that a copy of this consent can be made available to me via my patient portal [facility] MyChart), or I can request a printed copy by calling the office of [facility].    I understand that my insurance will be billed for this visit.   I have read or had this consent read to me. I understand the contents of this consent, which adequately explains the benefits and risks of the Services being provided via telemedicine.  I have been provided ample opportunity to ask questions regarding this consent and the Services and have had my questions answered to my satisfaction. I give my informed consent for the services to be provided through the use of telemedicine in my medical care

## 2022-10-30 ENCOUNTER — Ambulatory Visit: Payer: Medicare Other | Attending: Cardiology | Admitting: *Deleted

## 2022-10-30 ENCOUNTER — Ambulatory Visit (INDEPENDENT_AMBULATORY_CARE_PROVIDER_SITE_OTHER): Payer: Medicare Other

## 2022-10-30 DIAGNOSIS — Z01810 Encounter for preprocedural cardiovascular examination: Secondary | ICD-10-CM | POA: Diagnosis present

## 2022-10-30 DIAGNOSIS — Z7901 Long term (current) use of anticoagulants: Secondary | ICD-10-CM | POA: Diagnosis present

## 2022-10-30 DIAGNOSIS — I480 Paroxysmal atrial fibrillation: Secondary | ICD-10-CM | POA: Insufficient documentation

## 2022-10-30 LAB — POCT INR: INR: 1.3 — AB (ref 2.0–3.0)

## 2022-10-30 NOTE — Patient Instructions (Signed)
Description   Continue holding for upcoming procedure.  Continue taking warfarin 1/2 tablet daily except for 1 tablet on Mondays and Thursdays. Recheck INR in 1 week post procedure. Coumadin Clinic [phone] or [phone];  Back Injection 6/7, HOLDING Coumadin 6/2-6/6;

## 2022-10-30 NOTE — Progress Notes (Signed)
Virtual Visit via Telephone Note   Because of [name] [name] co-morbid illnesses, he is at least at moderate risk for complications without adequate follow up.  This format is felt to be most appropriate for this patient at this time.  The patient did not have access to video technology/had technical difficulties with video requiring transitioning to audio format only (telephone).  All issues noted in this document were discussed and addressed.  No physical exam could be performed with this format.  Please refer to the patient's chart for his consent to telehealth for [facility].  Evaluation Performed:  Preoperative cardiovascular risk assessment _____________   Date:  10/30/2022   Patient ID:  [name] [name], DOB 08-26-1940, MRN [id number] Patient Location:  Home Provider location:   Office  Primary Care Provider:  Frederich Chick., MD Primary Cardiologist:  [name] Bunting, MD  Chief Complaint / Patient Profile   82 y.o. y/o male with a h/o PAF s/p ablation, CAD who is pending right L4-L5, L5-S1 TFESI injection and presents today for telephonic preoperative cardiovascular risk assessment.  History of Present Illness    [name] [name] is a 82 y.o. male who presents via audio/video conferencing for a telehealth visit today.  Pt was last seen in cardiology clinic on 06/27/22 by Dr. Ladona [name].  At that time [name] [name] was doing well .  The patient is now pending procedure as outlined above. Since his last visit, he tells me that he has not had any chest pain or shortness of breath.  No pressure in his chest with walking.  His knees and back hold him up from physical activity.  He had a knee replacement 2 years ago which has not fully healed.  He started holding his Coumadin on Sunday and his INR today was 1.3.  DASI barely exceeded 4 METS.  Would recommend a 5-day Coumadin hold which the patient is already doing.  Please resume when it is medically safe to do so.  Past  Medical History    Past Medical History:  Diagnosis Date   Anemia    since post op- knee replacement    Anxiety    takes xanax 2 times per day, taken mostly for ringing in ears     Arthritis of knee, right    end-stage   Bronchitis    Bursitis    Complication of anesthesia    irritation in throat postop, makes a-fib worse    Depression    DJD (degenerative joint disease)    OA- "everywhere"   Dysrhythmia    Family history of anesthesia complication    sister with nausia and vomiting   Gout    Heart murmur    HTN (hypertension)    Hyperlipidemia    Migraines    Paroxysmal atrial fibrillation (HCC)    PONV (postoperative nausea and vomiting)    Presence of permanent cardiac pacemaker    Tinnitus    Past Surgical History:  Procedure Laterality Date   APPENDECTOMY     Atrial fibrillation ablation  2009, 12/02/11   PVI by Dr Johney [name] x 2   ATRIAL FIBRILLATION ABLATION N/A 12/02/2011   Procedure: ATRIAL FIBRILLATION ABLATION;  Surgeon: Hillis Range, MD;  Location: [facility] CATH LAB;  Service: Cardiovascular;  Laterality: N/A;   bilteral thumb joint surgrery     CARDIAC CATHETERIZATION  2005    which revealed 40% stenosis of the mid left anterior descending artery., ablation- 2009   CHOLECYSTECTOMY  JOINT REPLACEMENT     L knee- 12/11,R hip- 1996   left rotator cuff repair     PACEMAKER INSERTION  06/06/2005   Medtronic EnRhythm dual-chamber. For sick sinus syndrome   PERMANENT PACEMAKER GENERATOR CHANGE N/A 03/12/2012   Procedure: PERMANENT PACEMAKER GENERATOR CHANGE;  Surgeon: [name] Maw, MD;  Location: [facility] CATH LAB;  Service: Cardiovascular;  Laterality: N/A;   RHINOPLASTY     right rotator cuff surgery  2006   right total hip arthroplasty     TEE WITHOUT CARDIOVERSION  12/01/2011   Procedure: TRANSESOPHAGEAL ECHOCARDIOGRAM (TEE);  Surgeon: [name] Riffle, MD;  Location: [facility] ENDOSCOPY;  Service: Cardiovascular;  Laterality: N/A;  a-fib ablation following day   TONSILLECTOMY      as a child   TOTAL KNEE ARTHROPLASTY Right 11/27/2020   Procedure: TOTAL KNEE ARTHROPLASTY;  Surgeon: [name] Apley, MD;  Location: [facility] ORS;  Service: Orthopedics;  Laterality: Right;   TOTAL KNEE REVISION  08/08/2011   Procedure: TOTAL KNEE REVISION;  Surgeon: [name] Lewandowsky, MD;  Location: [facility] OR;  Service: Orthopedics;  Laterality: Left;  DEPUY/SIGMA   TRANSESOPHAGEAL ECHOCARDIOGRAM  04/2008   TRANSTHORACIC ECHOCARDIOGRAM  2006,2009   uvulectomy      Allergies  Allergies  Allergen Reactions   Iohexol Hives    POST MYELOGRAM.  PT ALSO SAID THIS HAPPENED A YEAR AGO WITH A MYELOGRAM AND WITH ESI'S., Onset Date: 16109604    Meperidine Hcl Other (See Comments)    hallucinations   Demerol Hcl [Meperidine] Other (See Comments)    Hallucinations   Prednisone Other (See Comments)    Redness of skin & jittery    Home Medications    Prior to Admission medications   Medication Sig Start Date End Date Taking? Authorizing Provider  ALPRAZolam Prudy Feeler) 0.5 MG tablet Take 1 tablet (0.5 mg total) by mouth 3 (three) times daily as needed for anxiety. 03/28/13   [name] Julian, PA-C  Carboxymethylcellulose Sodium (THERATEARS OP) Place 1 drop into both eyes at bedtime. Patient not taking: Reported on 10/28/2022    [provider]  FLUoxetine (PROZAC) 40 MG capsule Take one tablet each day for depression. (40mg  ) 03/28/13   [name], [name] Huger T, PA-C  gabapentin (NEURONTIN) 300 MG capsule Take 1 capsule (300 mg total) by mouth 2 (two) times daily for 14 days. For 2 weeks post op for pain. 11/28/20 12/12/20  [name] Pane, PA-C  losartan (COZAAR) 25 MG tablet Take 1 tablet (25 mg total) by mouth daily. Patient not taking: Reported on 10/28/2022 02/04/21   [name] Maw, MD  metoprolol succinate (TOPROL-XL) 100 MG 24 hr tablet TAKE 1 TABLET BY MOUTH ONCE DAILY WITH OR IMMEDIATELY FOLLOWING A MEAL 08/12/22   [name] Maw, MD  mupirocin ointment (BACTROBAN) 2 % Apply 1 application. topically  daily. Patient not taking: Reported on 10/28/2022 09/13/21   Raspet, Noberto Retort, PA-C  omeprazole (PRILOSEC OTC) 20 MG tablet Take 1 tablet (20 mg total) by mouth daily. For gastric protection Patient not taking: Reported on 10/28/2022 11/28/20 12/28/20  [name] Pane, PA-C  tamsulosin (FLOMAX) 0.4 MG CAPS capsule Take 0.4 mg by mouth in the morning and at bedtime. Patient not taking: Reported on 10/28/2022 08/10/20   [provider]  warfarin (COUMADIN) 5 MG tablet TAKE 1/2 TO 1 (ONE-HALF TO ONE) TABLET BY MOUTH ONCE DAILY AS  DIRECTED  BY  ANTICOAGULATION  CLINIC 04/15/22   [name] Maw, MD    Physical Exam  Vital Signs:  [name] [name] does not have vital signs available for review today.  Given telephonic nature of communication, physical exam is limited. AAOx3. NAD. Normal affect.  Speech and respirations are unlabored.  Accessory Clinical Findings    None  Assessment & Plan    1.  Preoperative Cardiovascular Risk Assessment:  Mr. Murrie perioperative risk of a major cardiac event is 6.6% according to the Revised Cardiac Risk Index (RCRI).  Therefore, he is at high risk for perioperative complications.   His functional capacity is fair at 4.06 METs according to the Duke Activity Status Index (DASI). Recommendations: According to ACC/AHA guidelines, no further cardiovascular testing needed.  The patient may proceed to surgery at acceptable risk.   Antiplatelet and/or Anticoagulation Recommendations:  Coumadin can by held for 5 days prior to surgery.  Please resume post op when felt to be safe.   The patient was advised that if he develops new symptoms prior to surgery to contact our office to arrange for a follow-up visit, and he verbalized understanding.  A copy of this note will be routed to requesting surgeon.  Time:   Today, I have spent 10 minutes with the patient with telehealth technology discussing medical history, symptoms, and management plan.     Sharlene Dory, PA-C  10/30/2022, 1:50 PM

## 2022-11-07 ENCOUNTER — Ambulatory Visit: Payer: Medicare Other | Attending: Internal Medicine

## 2022-11-07 DIAGNOSIS — Z7901 Long term (current) use of anticoagulants: Secondary | ICD-10-CM | POA: Insufficient documentation

## 2022-11-07 DIAGNOSIS — Z5181 Encounter for therapeutic drug level monitoring: Secondary | ICD-10-CM | POA: Diagnosis present

## 2022-11-07 LAB — POCT INR: INR: 2.2 (ref 2.0–3.0)

## 2022-11-07 NOTE — Patient Instructions (Signed)
Description   Continue taking warfarin 1/2 tablet daily except for 1 tablet on Mondays and Thursdays. Recheck INR in 4 weeks. Coumadin Clinic [phone] or [phone]      

## 2022-11-10 ENCOUNTER — Other Ambulatory Visit: Payer: Self-pay

## 2022-11-10 DIAGNOSIS — I480 Paroxysmal atrial fibrillation: Secondary | ICD-10-CM

## 2022-11-10 MED ORDER — WARFARIN SODIUM 5 MG PO TABS: ORAL_TABLET | ORAL | 3 refills | Status: DC

## 2022-11-25 ENCOUNTER — Ambulatory Visit: Payer: Medicare Other

## 2022-11-25 DIAGNOSIS — I495 Sick sinus syndrome: Secondary | ICD-10-CM | POA: Diagnosis not present

## 2022-11-26 LAB — CUP PACEART REMOTE DEVICE CHECK
Battery Impedance: 2053 Ohm
Battery Remaining Longevity: 35 mo
Battery Voltage: 2.76 V
Brady Statistic AP VP Percent: 0 %
Brady Statistic AP VS Percent: 98 %
Brady Statistic AS VP Percent: 0 %
Brady Statistic AS VS Percent: 2 %
Date Time Interrogation Session: 20240703143953
Implantable Lead Connection Status: 753985
Implantable Lead Connection Status: 753985
Implantable Lead Implant Date: 20070112
Implantable Lead Implant Date: 20070112
Implantable Lead Location: 753859
Implantable Lead Location: 753860
Implantable Lead Model: 5076
Implantable Lead Model: 5076
Implantable Pulse Generator Implant Date: 20131018
Lead Channel Impedance Value: 441 Ohm
Lead Channel Impedance Value: 605 Ohm
Lead Channel Pacing Threshold Amplitude: 0.5 V
Lead Channel Pacing Threshold Amplitude: 1.125 V
Lead Channel Pacing Threshold Pulse Width: 0.4 ms
Lead Channel Pacing Threshold Pulse Width: 0.4 ms
Lead Channel Setting Pacing Amplitude: 2 V
Lead Channel Setting Pacing Amplitude: 2.5 V
Lead Channel Setting Pacing Pulse Width: 0.34 ms
Lead Channel Setting Sensing Sensitivity: 2.8 mV
Zone Setting Status: 755011
Zone Setting Status: 755011

## 2022-12-05 ENCOUNTER — Ambulatory Visit: Payer: Medicare Other | Attending: Internal Medicine

## 2022-12-05 DIAGNOSIS — Z7901 Long term (current) use of anticoagulants: Secondary | ICD-10-CM | POA: Insufficient documentation

## 2022-12-05 LAB — POCT INR: INR: 1.8 — AB (ref 2.0–3.0)

## 2022-12-05 NOTE — Patient Instructions (Signed)
TAKE 1 TABLET TODAY ONLY THEN Continue taking warfarin 1/2 tablet daily except for 1 tablet on Mondays and Thursdays. Recheck INR in 4 weeks Coumadin Clinic [phone] or [phone]

## 2022-12-19 NOTE — Progress Notes (Signed)
Remote pacemaker transmission.   

## 2023-01-02 ENCOUNTER — Ambulatory Visit: Payer: Medicare Other | Attending: Internal Medicine

## 2023-01-02 DIAGNOSIS — Z5181 Encounter for therapeutic drug level monitoring: Secondary | ICD-10-CM | POA: Diagnosis present

## 2023-01-02 DIAGNOSIS — Z7901 Long term (current) use of anticoagulants: Secondary | ICD-10-CM

## 2023-01-02 LAB — POCT INR: INR: 1.3 — AB (ref 2.0–3.0)

## 2023-01-02 NOTE — Patient Instructions (Signed)
Description   Take 1 tablet today and 1 tablet tomorrow. Then START taking 0.5 tablet daily EXCEPT 1 tablet on Sunday, Tuesday, and Thursday.  Recheck INR in 1 week Coumadin Clinic [phone] or [phone]

## 2023-01-09 ENCOUNTER — Ambulatory Visit: Payer: Medicare Other

## 2023-01-16 ENCOUNTER — Ambulatory Visit: Payer: Medicare Other | Attending: Cardiovascular Disease

## 2023-01-16 DIAGNOSIS — Z7901 Long term (current) use of anticoagulants: Secondary | ICD-10-CM

## 2023-01-16 LAB — POCT INR: INR: 3.7 — AB (ref 2.0–3.0)

## 2023-01-16 NOTE — Patient Instructions (Signed)
HOLD TODAY and SATURDAY THEN CONTINUE taking 0.5 tablet daily EXCEPT 1 tablet on Sunday, Tuesday, and Thursday. Cataract surgery 9/6 Recheck INR in 2 weeks [facility] [phone] or [phone]

## 2023-01-28 ENCOUNTER — Ambulatory Visit: Payer: Medicare Other | Attending: Cardiovascular Disease

## 2023-01-28 DIAGNOSIS — Z5181 Encounter for therapeutic drug level monitoring: Secondary | ICD-10-CM | POA: Insufficient documentation

## 2023-01-28 DIAGNOSIS — Z7901 Long term (current) use of anticoagulants: Secondary | ICD-10-CM | POA: Insufficient documentation

## 2023-01-28 LAB — POCT INR: INR: 1.9 — AB (ref 2.0–3.0)

## 2023-01-28 NOTE — Patient Instructions (Signed)
Description   Take 1 tablet today and then continue taking 0.5 tablet daily EXCEPT 1 tablet on Sunday, Tuesday, and Thursday. Cataract surgery 9/6 Recheck INR in 2 weeks [facility] [phone] or [phone]

## 2023-02-11 ENCOUNTER — Ambulatory Visit: Payer: Medicare Other | Attending: Cardiovascular Disease

## 2023-02-11 DIAGNOSIS — Z5181 Encounter for therapeutic drug level monitoring: Secondary | ICD-10-CM | POA: Diagnosis present

## 2023-02-11 DIAGNOSIS — Z7901 Long term (current) use of anticoagulants: Secondary | ICD-10-CM | POA: Diagnosis not present

## 2023-02-11 LAB — POCT INR: INR: 3.2 — AB (ref 2.0–3.0)

## 2023-02-11 NOTE — Patient Instructions (Signed)
Description   HOLD today's dose and then START taking 0.5 tablet daily EXCEPT 1 tablet on Sunday and Thursday. Recheck INR in 2 weeks [facility] [phone] or [phone]

## 2023-02-24 ENCOUNTER — Ambulatory Visit (INDEPENDENT_AMBULATORY_CARE_PROVIDER_SITE_OTHER): Payer: Medicare Other

## 2023-02-24 DIAGNOSIS — I495 Sick sinus syndrome: Secondary | ICD-10-CM

## 2023-02-24 LAB — CUP PACEART REMOTE DEVICE CHECK
Battery Impedance: 2114 Ohm
Battery Remaining Longevity: 34 mo
Battery Voltage: 2.76 V
Brady Statistic AP VP Percent: 0 %
Brady Statistic AP VS Percent: 98 %
Brady Statistic AS VP Percent: 0 %
Brady Statistic AS VS Percent: 2 %
Date Time Interrogation Session: 20241001092849
Implantable Lead Connection Status: 753985
Implantable Lead Connection Status: 753985
Implantable Lead Implant Date: 20070112
Implantable Lead Implant Date: 20070112
Implantable Lead Location: 753859
Implantable Lead Location: 753860
Implantable Lead Model: 5076
Implantable Lead Model: 5076
Implantable Pulse Generator Implant Date: 20131018
Lead Channel Impedance Value: 478 Ohm
Lead Channel Impedance Value: 588 Ohm
Lead Channel Pacing Threshold Amplitude: 0.625 V
Lead Channel Pacing Threshold Amplitude: 1.125 V
Lead Channel Pacing Threshold Pulse Width: 0.4 ms
Lead Channel Pacing Threshold Pulse Width: 0.4 ms
Lead Channel Setting Pacing Amplitude: 2 V
Lead Channel Setting Pacing Amplitude: 2.5 V
Lead Channel Setting Pacing Pulse Width: 0.34 ms
Lead Channel Setting Sensing Sensitivity: 2.8 mV
Zone Setting Status: 755011
Zone Setting Status: 755011

## 2023-02-25 ENCOUNTER — Ambulatory Visit: Payer: Medicare Other | Attending: Cardiology | Admitting: *Deleted

## 2023-02-25 DIAGNOSIS — I480 Paroxysmal atrial fibrillation: Secondary | ICD-10-CM | POA: Insufficient documentation

## 2023-02-25 DIAGNOSIS — Z7901 Long term (current) use of anticoagulants: Secondary | ICD-10-CM | POA: Diagnosis not present

## 2023-02-25 LAB — POCT INR: INR: 1.9 — AB (ref 2.0–3.0)

## 2023-02-25 NOTE — Patient Instructions (Signed)
Description   Today take 1 tablet of warfarin then continue taking 0.5 tablet daily EXCEPT 1 tablet on Sunday and Thursday. Recheck INR in 3 weeks [facility] [phone] or [phone]

## 2023-03-11 NOTE — Progress Notes (Signed)
Remote pacemaker transmission.   

## 2023-03-18 ENCOUNTER — Ambulatory Visit: Payer: Medicare Other | Attending: Internal Medicine | Admitting: *Deleted

## 2023-03-18 DIAGNOSIS — Z7901 Long term (current) use of anticoagulants: Secondary | ICD-10-CM | POA: Insufficient documentation

## 2023-03-18 DIAGNOSIS — I480 Paroxysmal atrial fibrillation: Secondary | ICD-10-CM | POA: Diagnosis not present

## 2023-03-18 LAB — POCT INR: INR: 2.2 (ref 2.0–3.0)

## 2023-03-18 NOTE — Patient Instructions (Signed)
Description   Continue taking 1/2 tablet daily EXCEPT 1 tablet on Sunday and Thursday. Recheck INR in 4 weeks. Coumadin Clinic [phone] or [phone]

## 2023-04-15 ENCOUNTER — Ambulatory Visit: Payer: Medicare Other | Attending: Cardiovascular Disease | Admitting: *Deleted

## 2023-04-15 DIAGNOSIS — I480 Paroxysmal atrial fibrillation: Secondary | ICD-10-CM | POA: Diagnosis not present

## 2023-04-15 DIAGNOSIS — Z7901 Long term (current) use of anticoagulants: Secondary | ICD-10-CM

## 2023-04-15 LAB — POCT INR: INR: 2.1 (ref 2.0–3.0)

## 2023-04-15 NOTE — Patient Instructions (Addendum)
Description   Continue taking 1/2 tablet daily EXCEPT 1 tablet on Sunday and Thursday. Recheck INR in 5 weeks. Coumadin Clinic [phone] or [phone]  Procedure Fax Number [phone] or [phone]

## 2023-05-11 ENCOUNTER — Telehealth: Payer: Self-pay | Admitting: Internal Medicine

## 2023-05-11 ENCOUNTER — Other Ambulatory Visit: Payer: Self-pay

## 2023-05-11 DIAGNOSIS — Z79899 Other long term (current) drug therapy: Secondary | ICD-10-CM

## 2023-05-11 MED ORDER — LOSARTAN POTASSIUM 25 MG PO TABS: 25.0000 mg | ORAL_TABLET | Freq: Every day | ORAL | 3 refills | Status: AC

## 2023-05-11 NOTE — Telephone Encounter (Signed)
Pt c/o BP issue: STAT if pt c/o blurred vision, one-sided weakness or slurred speech  1. What are your last 5 BP readings? 148/102 last wednesday  2. Are you having any other symptoms (ex. Dizziness, headache, blurred vision, passed out)? Pt feels weak  3. What is your BP issue? Pt is requesting a callback regarding him having concerns about his BP being higher than usual for the last 3  months. He stated he hadn't checked it since last week. Please advise

## 2023-05-11 NOTE — Telephone Encounter (Signed)
Pt is concerned that BP has been elevated the past few months. BP was 147/102 last week. Pt could not tell me why he was not taking losartan 25mg  currently (looks like pt reported not taking it 10/28/22) but it looks like he was/should be taking it as of last OV 06/27/22. Refilled losartan once daily and ordered a BMET for a week. Will forward to Dr Ladona Ridgel for further review.  Pt stated understanding.

## 2023-05-13 NOTE — Telephone Encounter (Signed)
Continue to monitor. If SBP/DBP remains high would consider increasing the losartan to 50 mg daily

## 2023-05-14 ENCOUNTER — Other Ambulatory Visit: Payer: Self-pay | Admitting: Internal Medicine

## 2023-05-14 DIAGNOSIS — I480 Paroxysmal atrial fibrillation: Secondary | ICD-10-CM

## 2023-05-14 NOTE — Telephone Encounter (Signed)
Refill request for warfarin:  Last INR was 2.1 on 04/15/23 Next INR due 05/22/23 LOV was 06/27/22  Refill approved.

## 2023-05-22 ENCOUNTER — Ambulatory Visit: Payer: Medicare Other

## 2023-05-26 ENCOUNTER — Ambulatory Visit: Payer: Medicare Other | Attending: Internal Medicine

## 2023-05-26 DIAGNOSIS — I495 Sick sinus syndrome: Secondary | ICD-10-CM

## 2023-05-27 LAB — BASIC METABOLIC PANEL
BUN/Creatinine Ratio: 23 (ref 10–24)
BUN: 24 mg/dL (ref 8–27)
CO2: 27 mmol/L (ref 20–29)
Calcium: 9.7 mg/dL (ref 8.6–10.2)
Chloride: 101 mmol/L (ref 96–106)
Creatinine, Ser: 1.04 mg/dL (ref 0.76–1.27)
Glucose: 93 mg/dL (ref 70–99)
Potassium: 5.1 mmol/L (ref 3.5–5.2)
Sodium: 141 mmol/L (ref 134–144)
eGFR: 71 mL/min/{1.73_m2} (ref >59)

## 2023-05-28 LAB — CUP PACEART REMOTE DEVICE CHECK
Battery Impedance: 2270 Ohm
Battery Remaining Longevity: 33 mo
Battery Voltage: 2.76 V
Brady Statistic AP VP Percent: 0 %
Brady Statistic AP VS Percent: 98 %
Brady Statistic AS VP Percent: 0 %
Brady Statistic AS VS Percent: 2 %
Date Time Interrogation Session: 20250102131632
Implantable Lead Connection Status: 753985
Implantable Lead Connection Status: 753985
Implantable Lead Implant Date: 20070112
Implantable Lead Implant Date: 20070112
Implantable Lead Location: 753859
Implantable Lead Location: 753860
Implantable Lead Model: 5076
Implantable Lead Model: 5076
Implantable Pulse Generator Implant Date: 20131018
Lead Channel Impedance Value: 504 Ohm
Lead Channel Impedance Value: 700 Ohm
Lead Channel Pacing Threshold Amplitude: 0.5 V
Lead Channel Pacing Threshold Amplitude: 1.25 V
Lead Channel Pacing Threshold Pulse Width: 0.4 ms
Lead Channel Pacing Threshold Pulse Width: 0.4 ms
Lead Channel Setting Pacing Amplitude: 2 V
Lead Channel Setting Pacing Amplitude: 2.5 V
Lead Channel Setting Pacing Pulse Width: 0.34 ms
Lead Channel Setting Sensing Sensitivity: 2.8 mV
Zone Setting Status: 755011
Zone Setting Status: 755011

## 2023-06-01 ENCOUNTER — Ambulatory Visit: Payer: Medicare Other | Attending: Internal Medicine

## 2023-06-01 DIAGNOSIS — Z7901 Long term (current) use of anticoagulants: Secondary | ICD-10-CM | POA: Diagnosis not present

## 2023-06-01 LAB — POCT INR: INR: 3.2 — AB (ref 2.0–3.0)

## 2023-06-01 NOTE — Patient Instructions (Signed)
 HOLD TUESDAY ONLY THEN Continue taking 1/2 tablet daily EXCEPT 1 tablet on Sunday and Thursday. Recheck INR in 4 weeks. Coumadin Clinic [phone] or [phone]  Procedure Fax Number [phone] or [phone].  EAT GREENS TONIGHT.

## 2023-06-22 ENCOUNTER — Ambulatory Visit: Payer: Medicare Other | Attending: Internal Medicine | Admitting: Internal Medicine

## 2023-06-22 ENCOUNTER — Encounter: Payer: Self-pay | Admitting: Internal Medicine

## 2023-06-22 VITALS — BP 112/68 | HR 81 | Ht 66.0 in | Wt 164.6 lb

## 2023-06-22 DIAGNOSIS — I480 Paroxysmal atrial fibrillation: Secondary | ICD-10-CM

## 2023-06-22 NOTE — Progress Notes (Signed)
HPI [name] [name] returns today for followup of his PAF. He is a pleasant 83 yo man with a h/o PAF, s/p catheter ablation, and sinus node dysfunction s/p PPM insertion. He has lost weight. He has done well in the interim and his atrial fib burden has been stable. He denies chest pain or sob. Since I last saw him he has been in the hospital with post op atrial fib and a RVR. He was started back on metoprolol.   He has been bothered by knee pain after knee replacement surgery and is going for injections today.  Allergies  Allergen Reactions   Iohexol Hives    POST MYELOGRAM.  PT ALSO SAID THIS HAPPENED A YEAR AGO WITH A MYELOGRAM AND WITH ESI'S., Onset Date: 16109604    Meperidine Hcl Other (See Comments)    hallucinations   Demerol Hcl [Meperidine] Other (See Comments)    Hallucinations   Prednisone Other (See Comments)    Redness of skin & jittery     Current Outpatient Medications  Medication Sig Dispense Refill   ALPRAZolam (XANAX) 0.5 MG tablet Take 1 tablet (0.5 mg total) by mouth 3 (three) times daily as needed for anxiety. 90 tablet 0   Carboxymethylcellulose Sodium (THERATEARS OP) Place 1 drop into both eyes at bedtime.     FLUoxetine (PROZAC) 40 MG capsule Take one tablet each day for depression. (40mg  ) 30 capsule 0   losartan (COZAAR) 25 MG tablet Take 1 tablet (25 mg total) by mouth daily. 90 tablet 3   metoprolol succinate (TOPROL-XL) 100 MG 24 hr tablet TAKE 1 TABLET BY MOUTH ONCE DAILY WITH OR IMMEDIATELY FOLLOWING A MEAL 90 tablet 3   tamsulosin (FLOMAX) 0.4 MG CAPS capsule Take 0.4 mg by mouth in the morning and at bedtime.     warfarin (COUMADIN) 5 MG tablet TAKE 1/2 TO 1 TABLET BY MOUTH ONCE DAILY AS DIRECTED BY ANTICOAGULATION CLINIC 30 tablet 3   gabapentin (NEURONTIN) 300 MG capsule Take 1 capsule (300 mg total) by mouth 2 (two) times daily for 14 days. For 2 weeks post op for pain. 28 capsule 0   No current facility-administered medications for this visit.      Past Medical History:  Diagnosis Date   Anemia    since post op- knee replacement    Anxiety    takes xanax 2 times per day, taken mostly for ringing in ears     Arthritis of knee, right    end-stage   Bronchitis    Bursitis    Complication of anesthesia    irritation in throat postop, makes a-fib worse    Depression    DJD (degenerative joint disease)    OA- "everywhere"   Dysrhythmia    Family history of anesthesia complication    sister with nausia and vomiting   Gout    Heart murmur    HTN (hypertension)    Hyperlipidemia    Migraines    Paroxysmal atrial fibrillation (HCC)    PONV (postoperative nausea and vomiting)    Presence of permanent cardiac pacemaker    Tinnitus     ROS:   All systems reviewed and negative except as noted in the HPI.   Past Surgical History:  Procedure Laterality Date   APPENDECTOMY     Atrial fibrillation ablation  2009, 12/02/11   PVI by Dr Johney Frame x 2   ATRIAL FIBRILLATION ABLATION N/A 12/02/2011   Procedure: ATRIAL FIBRILLATION ABLATION;  Surgeon:  Hillis Range, MD;  Location: [facility] CATH LAB;  Service: Cardiovascular;  Laterality: N/A;   bilteral thumb joint surgrery     CARDIAC CATHETERIZATION  2005    which revealed 40% stenosis of the mid left anterior descending artery., ablation- 2009   CHOLECYSTECTOMY     JOINT REPLACEMENT     L knee- 12/11,R hip- 1996   left rotator cuff repair     PACEMAKER INSERTION  06/06/2005   Medtronic EnRhythm dual-chamber. For sick sinus syndrome   PERMANENT PACEMAKER GENERATOR CHANGE N/A 03/12/2012   Procedure: PERMANENT PACEMAKER GENERATOR CHANGE;  Surgeon: Marinus Maw, MD;  Location: [facility] CATH LAB;  Service: Cardiovascular;  Laterality: N/A;   RHINOPLASTY     right rotator cuff surgery  2006   right total hip arthroplasty     TEE WITHOUT CARDIOVERSION  12/01/2011   Procedure: TRANSESOPHAGEAL ECHOCARDIOGRAM (TEE);  Surgeon: Pricilla Riffle, MD;  Location: [facility] ENDOSCOPY;  Service: Cardiovascular;   Laterality: N/A;  a-fib ablation following day   TONSILLECTOMY     as a child   TOTAL KNEE ARTHROPLASTY Right 11/27/2020   Procedure: TOTAL KNEE ARTHROPLASTY;  Surgeon: Sheral Apley, MD;  Location: [facility] ORS;  Service: Orthopedics;  Laterality: Right;   TOTAL KNEE REVISION  08/08/2011   Procedure: TOTAL KNEE REVISION;  Surgeon: Nestor Lewandowsky, MD;  Location: [facility] OR;  Service: Orthopedics;  Laterality: Left;  DEPUY/SIGMA   TRANSESOPHAGEAL ECHOCARDIOGRAM  04/2008   TRANSTHORACIC ECHOCARDIOGRAM  2006,2009   uvulectomy       Family History  Problem Relation Age of Onset   Coronary artery disease Mother    Cerebrovascular Disease Mother    Cancer Other    Anesthesia problems Neg Hx    Hypotension Neg Hx    Malignant hyperthermia Neg Hx    Pseudochol deficiency Neg Hx      Social History   Socioeconomic History   Marital status: Married    Spouse name: Not on file   Number of children: 2   Years of education: Not on file   Highest education level: Not on file  Occupational History   Occupation: retired    Associate Professor: RETIRED  Tobacco Use   Smoking status: Never   Smokeless tobacco: Never  Vaping Use   Vaping status: Never Used  Substance and Sexual Activity   Alcohol use: Not Currently    Comment: rare use   Drug use: No   Sexual activity: Not on file  Other Topics Concern   Not on file  Social History Narrative   Not on file   Social Drivers of Health   Financial Resource Strain: Not on file  Food Insecurity: Not on file  Transportation Needs: Not on file  Physical Activity: Not on file  Stress: Not on file  Social Connections: Not on file  Intimate Partner Violence: Not on file     BP 112/68   Pulse 81   Ht 5\' 6"  (1.676 m)   Wt 164 lb 9.6 oz (74.7 kg)   SpO2 98%   BMI 26.57 kg/m   Physical Exam:  Well appearing NAD HEENT: Unremarkable Neck:  No JVD, no thyromegally Lymphatics:  No adenopathy Back:  No CVA tenderness Lungs:  Clear HEART:  Regular  rate rhythm, no murmurs, no rubs, no clicks Abd:  soft, positive bowel sounds, no organomegally, no rebound, no guarding Ext:  2 plus pulses, no edema, no cyanosis, no clubbing Skin:  No rashes no nodules Neuro:  CN  II through XII intact, motor grossly intact  EKG - NSR with atrial pacing.  DEVICE  Normal device function.  See PaceArt for details.   Assess/Plan:  1.Sinus node dysfunction - he is asymptomatic, s/p PPM insertion. No escape today. 2. PAF - he is maintaining NSR about 96% of the time.  3. HTN - his bp is up a bit but is usually better at home. 4. PPM - his medtronic DDD PM is working [employer] and has about 2.5 years of battery longevity.   [name] Gowda Ronnald Shedden,MD

## 2023-06-22 NOTE — Patient Instructions (Addendum)
Medication Instructions:  Your physician recommends that you continue on your current medications as directed. Please refer to the Current Medication list given to you today.  *If you need a refill on your cardiac medications before your next appointment, please call your pharmacy*  Lab Work: None ordered.  If you have labs (blood work) drawn today and your tests are completely normal, you will receive your results only by: MyChart Message (if you have MyChart) OR A paper copy in the mail If you have any lab test that is abnormal or we need to change your treatment, we will call you to review the results.  Testing/Procedures: None ordered.  Follow-Up: At [facility], you and your health needs are our priority.  As part of our continuing mission to provide you with exceptional heart care, we have created designated Provider Care Teams.  These Care Teams include your primary Cardiologist (physician) and Advanced Practice Providers (APPs -  Physician Assistants and Nurse Practitioners) who all work together to provide you with the care you need, when you need it.   Your next appointment:   1 year(s)  The format for your next appointment:   In Person  Provider:   Anselm Jungling, MD{or one of the following Advanced Practice Providers on your designated Care Team:   Francis Dowse, [place] Casimiro Needle "Mardelle Matte" [place], [place] Earnest Rosier, NP  Remote monitoring is used to monitor your Pacemaker/ ICD from home. This monitoring reduces the number of office visits required to check your device to one time per year. It allows [place] to keep an eye on the functioning of your device to ensure it is working properly.  Important Information About Sugar

## 2023-06-29 ENCOUNTER — Ambulatory Visit: Payer: Medicare Other | Attending: Internal Medicine | Admitting: *Deleted

## 2023-06-29 DIAGNOSIS — I480 Paroxysmal atrial fibrillation: Secondary | ICD-10-CM | POA: Diagnosis not present

## 2023-06-29 DIAGNOSIS — Z7901 Long term (current) use of anticoagulants: Secondary | ICD-10-CM | POA: Diagnosis present

## 2023-06-29 LAB — POCT INR: INR: 2 (ref 2.0–3.0)

## 2023-06-29 NOTE — Patient Instructions (Addendum)
Description   Today take 1 tablet of warfarin then continue taking 1/2 tablet daily EXCEPT 1 tablet on Sunday and Thursday. Recheck INR in 4 weeks. Coumadin Clinic [phone] or [phone] Please have a Clearance Form faxed over for any upcoming procedures of any. Procedure Fax Number [phone] or [phone]

## 2023-07-07 NOTE — Progress Notes (Signed)
Remote pacemaker transmission.

## 2023-07-07 NOTE — Addendum Note (Signed)
Addended by: Geralyn Flash D on: 07/07/2023 12:17 PM   Modules accepted: Orders

## 2023-07-26 ENCOUNTER — Other Ambulatory Visit: Payer: Self-pay | Admitting: Internal Medicine

## 2023-07-27 ENCOUNTER — Ambulatory Visit: Payer: Medicare Other | Attending: Internal Medicine | Admitting: *Deleted

## 2023-07-27 DIAGNOSIS — I480 Paroxysmal atrial fibrillation: Secondary | ICD-10-CM | POA: Diagnosis present

## 2023-07-27 DIAGNOSIS — Z7901 Long term (current) use of anticoagulants: Secondary | ICD-10-CM | POA: Diagnosis not present

## 2023-07-27 LAB — POCT INR: INR: 2.1 (ref 2.0–3.0)

## 2023-07-27 NOTE — Patient Instructions (Addendum)
 Description   Continue taking 1/2 tablet daily EXCEPT 1 tablet on Sunday and Thursday. Recheck INR in 5 weeks. Coumadin Clinic [phone] or [phone] Please have a Clearance Form faxed over for any upcoming procedures of any. Procedure Fax Number [phone] or [phone]

## 2023-08-14 ENCOUNTER — Ambulatory Visit (HOSPITAL_COMMUNITY)
Admission: RE | Admit: 2023-08-14 | Discharge: 2023-08-14 | Disposition: A | Discharge status: Home / Self Care | Source: Ambulatory Visit | Attending: Cardiology | Admitting: Cardiology

## 2023-08-14 ENCOUNTER — Other Ambulatory Visit (HOSPITAL_COMMUNITY): Payer: Self-pay | Admitting: Physical Medicine and Rehabilitation

## 2023-08-14 DIAGNOSIS — M7989 Other specified soft tissue disorders: Secondary | ICD-10-CM

## 2023-08-18 ENCOUNTER — Other Ambulatory Visit: Payer: Self-pay | Admitting: Physical Medicine and Rehabilitation

## 2023-08-18 DIAGNOSIS — M5416 Radiculopathy, lumbar region: Secondary | ICD-10-CM

## 2023-08-18 DIAGNOSIS — M545 Low back pain, unspecified: Secondary | ICD-10-CM

## 2023-08-24 ENCOUNTER — Telehealth: Payer: Self-pay

## 2023-08-24 MED ORDER — PREDNISONE 50 MG PO TABS: ORAL_TABLET | ORAL | 0 refills | Status: AC

## 2023-08-24 NOTE — Telephone Encounter (Signed)
 Phone call to patient to review instructions for 13 hr prep for CT w/ contrast on 08/26/23  at 10:30AM. Prescription called into [facility]. Pt aware and verbalized understanding of instructions. Prescription: 08/25/23 @9 :30PM- 50mg  Prednisone 08/26/23 @3 :30AM- 50mg  Prednisone 08/26/23 @9 :30AM - 50mg  Prednisone and 50mg  Benadryl

## 2023-08-25 ENCOUNTER — Ambulatory Visit (INDEPENDENT_AMBULATORY_CARE_PROVIDER_SITE_OTHER): Payer: Medicare Other

## 2023-08-25 DIAGNOSIS — I495 Sick sinus syndrome: Secondary | ICD-10-CM

## 2023-08-26 ENCOUNTER — Ambulatory Visit
Admission: RE | Admit: 2023-08-26 | Discharge: 2023-08-26 | Disposition: A | Discharge status: Home / Self Care | Source: Ambulatory Visit | Attending: Physical Medicine and Rehabilitation | Admitting: Physical Medicine and Rehabilitation

## 2023-08-26 DIAGNOSIS — M5416 Radiculopathy, lumbar region: Secondary | ICD-10-CM

## 2023-08-26 MED ORDER — DIAZEPAM 5 MG PO TABS: 5.0000 mg | ORAL_TABLET | Freq: Once | ORAL | Status: DC

## 2023-08-26 MED ORDER — IOPAMIDOL (ISOVUE-M 200) INJECTION 41%
20.0000 mL | Freq: Once | INTRAMUSCULAR | Status: AC
  Administered 2023-08-26: 20 mL via INTRATHECAL

## 2023-08-26 NOTE — Discharge Instructions (Signed)

## 2023-08-28 LAB — CUP PACEART REMOTE DEVICE CHECK
Battery Impedance: 2283 Ohm
Battery Remaining Longevity: 32 mo
Battery Voltage: 2.76 V
Brady Statistic AP VP Percent: 0 %
Brady Statistic AP VS Percent: 98 %
Brady Statistic AS VP Percent: 0 %
Brady Statistic AS VS Percent: 1 %
Date Time Interrogation Session: 20250403092212
Implantable Lead Connection Status: 753985
Implantable Lead Connection Status: 753985
Implantable Lead Implant Date: 20070112
Implantable Lead Implant Date: 20070112
Implantable Lead Location: 753859
Implantable Lead Location: 753860
Implantable Lead Model: 5076
Implantable Lead Model: 5076
Implantable Pulse Generator Implant Date: 20131018
Lead Channel Impedance Value: 463 Ohm
Lead Channel Impedance Value: 553 Ohm
Lead Channel Pacing Threshold Amplitude: 0.5 V
Lead Channel Pacing Threshold Amplitude: 1.25 V
Lead Channel Pacing Threshold Pulse Width: 0.4 ms
Lead Channel Pacing Threshold Pulse Width: 0.4 ms
Lead Channel Setting Pacing Amplitude: 2 V
Lead Channel Setting Pacing Amplitude: 2.5 V
Lead Channel Setting Pacing Pulse Width: 0.34 ms
Lead Channel Setting Sensing Sensitivity: 2 mV
Zone Setting Status: 755011
Zone Setting Status: 755011

## 2023-08-30 ENCOUNTER — Encounter: Payer: Self-pay | Admitting: Internal Medicine

## 2023-09-02 ENCOUNTER — Ambulatory Visit: Attending: Internal Medicine | Admitting: *Deleted

## 2023-09-02 DIAGNOSIS — Z7901 Long term (current) use of anticoagulants: Secondary | ICD-10-CM | POA: Insufficient documentation

## 2023-09-02 DIAGNOSIS — I480 Paroxysmal atrial fibrillation: Secondary | ICD-10-CM | POA: Insufficient documentation

## 2023-09-02 LAB — POCT INR: INR: 2.1 (ref 2.0–3.0)

## 2023-09-02 NOTE — Patient Instructions (Signed)
 Description   Continue taking 1/2 tablet daily EXCEPT 1 tablet on Sunday and Thursday. Recheck INR in 6 weeks. Coumadin Clinic [phone] or [phone] Please have a Clearance Form faxed over for any upcoming procedures of any. Procedure Fax Number [phone] or [phone]

## 2023-10-08 NOTE — Addendum Note (Signed)
 Addended by: Lott Rouleau A on: 10/08/2023 09:55 AM   Modules accepted: Orders

## 2023-10-08 NOTE — Progress Notes (Signed)
 Remote pacemaker transmission.

## 2023-10-14 ENCOUNTER — Ambulatory Visit: Attending: Internal Medicine | Admitting: *Deleted

## 2023-10-14 DIAGNOSIS — Z7901 Long term (current) use of anticoagulants: Secondary | ICD-10-CM | POA: Diagnosis present

## 2023-10-14 DIAGNOSIS — I480 Paroxysmal atrial fibrillation: Secondary | ICD-10-CM | POA: Diagnosis present

## 2023-10-14 LAB — POCT INR: INR: 2.3 (ref 2.0–3.0)

## 2023-10-14 NOTE — Patient Instructions (Signed)
 Description   Continue taking 1/2 tablet daily EXCEPT 1 tablet on Sunday and Thursday. Recheck INR in 6 weeks. Coumadin  Clinic [phone] Please have a Clearance Form faxed over for any upcoming procedures of any. Procedure Fax Number [phone] or [phone]

## 2023-11-16 ENCOUNTER — Telehealth: Payer: Self-pay | Admitting: Internal Medicine

## 2023-11-16 NOTE — Telephone Encounter (Signed)
 Outreach made to Pt.  Requested he sent a transmission for review.    Will review transmission and follow up.

## 2023-11-16 NOTE — Telephone Encounter (Signed)
 Spoke with Pt. Told pt to make sure he is drinking plenty of fluids and taking slow transitions. Advised pt we would call back once we hear next steps from Dr Waddell.

## 2023-11-16 NOTE — Telephone Encounter (Signed)
 Transmission received.  Device function WNL.  Patient atrially paces 99.1%.  No episodes noted in April.  Will route to Dr. Adrian nurse.    Pt with history of orthostasis.

## 2023-11-16 NOTE — Telephone Encounter (Signed)
 Pt called to report that he had a syncopal episode early April 2025.. he is not sure the exact date... but he has been having dizzy spells with position changes.   He says he has been eating and drinking well.. he denies chest pain, SOB.SABRA no palpitations.   When he had his syncope in April... he was standing after sitting for a while and lost his balance and passed out... he is not sure how long he had LOC.SABRA  He did not injure himself.   I will send to device for a possible look at his interrogation at that time... I will send to Dr Waddell and his nurse to see when he can be worked in to see him.

## 2023-11-16 NOTE — Telephone Encounter (Signed)
 STAT if patient feels like he/she is going to faint   1. Are you feeling dizzy, lightheaded, or faint right now? No, just when he stands    2. Have you passed out?  Yes back in April (If yes move to .SYNCOPECHMG)   3. Do you have any other symptoms? Bruising possibly from warfarin   4. Have you checked your HR and BP (record if available)? Last week 138/80 hr 67

## 2023-11-23 ENCOUNTER — Other Ambulatory Visit: Payer: Self-pay | Admitting: Internal Medicine

## 2023-11-23 DIAGNOSIS — I480 Paroxysmal atrial fibrillation: Secondary | ICD-10-CM

## 2023-11-23 NOTE — Telephone Encounter (Signed)
 Left message to call back

## 2023-11-24 ENCOUNTER — Ambulatory Visit: Payer: Medicare Other

## 2023-11-24 DIAGNOSIS — I495 Sick sinus syndrome: Secondary | ICD-10-CM

## 2023-11-25 ENCOUNTER — Ambulatory Visit: Attending: Internal Medicine | Admitting: *Deleted

## 2023-11-25 DIAGNOSIS — Z7901 Long term (current) use of anticoagulants: Secondary | ICD-10-CM | POA: Diagnosis present

## 2023-11-25 DIAGNOSIS — I480 Paroxysmal atrial fibrillation: Secondary | ICD-10-CM | POA: Insufficient documentation

## 2023-11-25 LAB — POCT INR: INR: 1.8 — AB (ref 2.0–3.0)

## 2023-11-25 NOTE — Progress Notes (Signed)
Please see anticoagulation encounter.

## 2023-11-25 NOTE — Patient Instructions (Addendum)
 Description   Since you took extra 1/2 tablet on yesterday, then continue taking warfarin 1/2 tablet daily EXCEPT 1 tablet on Sunday and Thursday. Recheck INR in 6 weeks. Coumadin  Clinic [phone] Please have a Clearance Form faxed over for any upcoming procedures of any. Procedure Fax Number [phone]

## 2023-11-26 LAB — CUP PACEART REMOTE DEVICE CHECK
Battery Impedance: 2390 Ohm
Battery Remaining Longevity: 31 mo
Battery Voltage: 2.75 V
Brady Statistic AP VP Percent: 0 %
Brady Statistic AP VS Percent: 99 %
Brady Statistic AS VP Percent: 0 %
Brady Statistic AS VS Percent: 1 %
Date Time Interrogation Session: 20250703125301
Implantable Lead Connection Status: 753985
Implantable Lead Connection Status: 753985
Implantable Lead Implant Date: 20070112
Implantable Lead Implant Date: 20070112
Implantable Lead Location: 753859
Implantable Lead Location: 753860
Implantable Lead Model: 5076
Implantable Lead Model: 5076
Implantable Pulse Generator Implant Date: 20131018
Lead Channel Impedance Value: 458 Ohm
Lead Channel Impedance Value: 623 Ohm
Lead Channel Pacing Threshold Amplitude: 0.5 V
Lead Channel Pacing Threshold Amplitude: 1.25 V
Lead Channel Pacing Threshold Pulse Width: 0.4 ms
Lead Channel Pacing Threshold Pulse Width: 0.4 ms
Lead Channel Setting Pacing Amplitude: 2 V
Lead Channel Setting Pacing Amplitude: 2.5 V
Lead Channel Setting Pacing Pulse Width: 0.34 ms
Lead Channel Setting Sensing Sensitivity: 2.8 mV
Zone Setting Status: 755011
Zone Setting Status: 755011

## 2023-12-03 ENCOUNTER — Ambulatory Visit: Payer: Self-pay | Admitting: Internal Medicine

## 2023-12-30 ENCOUNTER — Ambulatory Visit: Attending: Internal Medicine

## 2023-12-30 DIAGNOSIS — Z7901 Long term (current) use of anticoagulants: Secondary | ICD-10-CM | POA: Insufficient documentation

## 2023-12-30 LAB — POCT INR: INR: 1.6 — AB (ref 2.0–3.0)

## 2023-12-30 NOTE — Progress Notes (Signed)
 INR 1.6 Please see anticoagulation encounter

## 2023-12-30 NOTE — Patient Instructions (Signed)
 Description   Take 1 tablet today and then START taking warfarin 1/2 tablet daily EXCEPT 1 tablet on Sunday, Tuesday, and Thursday.  Recheck INR in 6 weeks.  Coumadin  Clinic [phone]  Please have a Clearance Form faxed over for any upcoming procedures of any. Procedure Fax Number [phone]

## 2024-01-15 ENCOUNTER — Ambulatory Visit: Attending: Internal Medicine

## 2024-01-15 DIAGNOSIS — Z7901 Long term (current) use of anticoagulants: Secondary | ICD-10-CM | POA: Insufficient documentation

## 2024-01-15 LAB — POCT INR: INR: 2.3 (ref 2.0–3.0)

## 2024-01-15 NOTE — Patient Instructions (Addendum)
 Description   INR 2.3: Continue taking warfarin 1/2 tablet daily EXCEPT 1 tablet on Sunday, Tuesday, and Thursday.  Recheck INR in 6 weeks.  Coumadin  Clinic [phone]  Please have a Clearance Form faxed over for any upcoming procedures of any. Procedure Fax Number [phone]

## 2024-01-15 NOTE — Progress Notes (Signed)
 Description   INR 2.3: Continue taking warfarin 1/2 tablet daily EXCEPT 1 tablet on Sunday, Tuesday, and Thursday.  Recheck INR in 6 weeks.  Coumadin  Clinic [phone]  Please have a Clearance Form faxed over for any upcoming procedures of any. Procedure Fax Number [phone]

## 2024-02-23 ENCOUNTER — Ambulatory Visit: Payer: Medicare Other

## 2024-02-23 DIAGNOSIS — I480 Paroxysmal atrial fibrillation: Secondary | ICD-10-CM

## 2024-02-24 ENCOUNTER — Ambulatory Visit: Payer: Self-pay | Admitting: Internal Medicine

## 2024-02-24 LAB — CUP PACEART REMOTE DEVICE CHECK
Battery Impedance: 2549 Ohm
Battery Remaining Longevity: 29 mo
Battery Voltage: 2.74 V
Brady Statistic AP VP Percent: 0 %
Brady Statistic AP VS Percent: 98 %
Brady Statistic AS VP Percent: 0 %
Brady Statistic AS VS Percent: 1 %
Date Time Interrogation Session: 20250930163333
Implantable Lead Connection Status: 753985
Implantable Lead Connection Status: 753985
Implantable Lead Implant Date: 20070112
Implantable Lead Implant Date: 20070112
Implantable Lead Location: 753859
Implantable Lead Location: 753860
Implantable Lead Model: 5076
Implantable Lead Model: 5076
Implantable Pulse Generator Implant Date: 20131018
Lead Channel Impedance Value: 506 Ohm
Lead Channel Impedance Value: 584 Ohm
Lead Channel Pacing Threshold Amplitude: 0.625 V
Lead Channel Pacing Threshold Amplitude: 1.375 V
Lead Channel Pacing Threshold Pulse Width: 0.4 ms
Lead Channel Pacing Threshold Pulse Width: 0.4 ms
Lead Channel Setting Pacing Amplitude: 2 V
Lead Channel Setting Pacing Amplitude: 2.5 V
Lead Channel Setting Pacing Pulse Width: 0.34 ms
Lead Channel Setting Sensing Sensitivity: 2.8 mV
Zone Setting Status: 755011
Zone Setting Status: 755011

## 2024-02-25 NOTE — Progress Notes (Signed)
 Remote PPM Transmission

## 2024-02-26 ENCOUNTER — Ambulatory Visit: Attending: Internal Medicine

## 2024-02-26 DIAGNOSIS — Z7901 Long term (current) use of anticoagulants: Secondary | ICD-10-CM | POA: Diagnosis present

## 2024-02-26 LAB — POCT INR: INR: 4.8 — AB (ref 2.0–3.0)

## 2024-02-26 NOTE — Patient Instructions (Signed)
 Hold today and tomorrow then Continue taking warfarin 1/2 tablet daily EXCEPT 1 tablet on Sunday, Tuesday, and Thursday.  Recheck INR in 4 weeks (PER PT REQUEST)  Eat 2 helpings of greens per week. Coumadin  Clinic [phone]  Please have a Clearance Form faxed over for any upcoming procedures of any. Procedure Fax Number [phone]

## 2024-02-26 NOTE — Progress Notes (Signed)
 INR 4.8 Please see anticoagulation encounter Hold today and tomorrow then Continue taking warfarin 1/2 tablet daily EXCEPT 1 tablet on Sunday, Tuesday, and Thursday.  Recheck INR in 4 weeks (PER PT REQUEST)  Eat 2 helpings of greens per week. Coumadin  Clinic [phone]  Please have a Clearance Form faxed over for any upcoming procedures of any.

## 2024-02-29 NOTE — Progress Notes (Signed)
 Remote PPM Transmission

## 2024-03-15 ENCOUNTER — Telehealth (HOSPITAL_BASED_OUTPATIENT_CLINIC_OR_DEPARTMENT_OTHER): Payer: Self-pay

## 2024-03-15 NOTE — Telephone Encounter (Signed)
   Pre-operative Risk Assessment    Patient Name: [name] [name]  DOB: 1940/06/07 MRN: [id number]   Date of last office visit: 06/22/23 with Dr. Waddell Date of next office visit: NA  Request for Surgical Clearance    Procedure:  Dental Extraction - Amount of Teeth to be Pulled:  1 - surgical   Date of Surgery:  Clearance TBD                                Surgeon:  Not indicated Surgeon's Group or Practice Name:  [facility] Phone number:  [phone] Fax number:  [phone]   Type of Clearance Requested:   - Medical  - Pharmacy:  Hold Warfarin (Coumadin ) not indicated   Type of Anesthesia:  Local    Additional requests/questions:    [name]   03/15/2024, 10:56 AM

## 2024-03-16 ENCOUNTER — Telehealth (HOSPITAL_BASED_OUTPATIENT_CLINIC_OR_DEPARTMENT_OTHER): Payer: Self-pay | Admitting: *Deleted

## 2024-03-16 NOTE — Telephone Encounter (Signed)
   Name: [name] [name]  DOB: May 26, 1941  MRN: [id number]  Primary Cardiologist: Danelle Birmingham, MD   Preoperative team, please contact this patient and set up a phone call appointment for further preoperative risk assessment. Please obtain consent and complete medication review. Thank you for your help.  I confirm that guidance regarding antiplatelet and oral anticoagulation therapy has been completed and, if necessary, noted below.  Reached out to pharmacy for recommendations on coumadin , see below.   I also confirmed the patient resides in the state of [place] . As per [facility] Medical Board telemedicine laws, the patient must reside in the state in which the provider is licensed.   Lamarr Satterfield, NP 03/16/2024, 8:52 AM [facility]

## 2024-03-16 NOTE — Telephone Encounter (Signed)
 Pt has been scheduled tele preop appt 03/23/24. Med rec and consent are done. Pt needs to get this done before D[facility] will schedule the procedure.

## 2024-03-16 NOTE — Telephone Encounter (Signed)
 Pt has been scheduled tele preop appt 03/23/24. Med rec and consent are done. Pt needs to get this done before [facility] will schedule the procedure.       Patient Consent for Virtual Visit        [name] [name] has provided verbal consent on 03/16/2024 for a virtual visit (video or telephone).   CONSENT FOR VIRTUAL VISIT FOR:  [name] [name] [place]  By participating in this virtual visit I agree to the following:  I hereby voluntarily request, consent and authorize [facility] and its employed or contracted physicians, physician assistants, nurse practitioners or other licensed health care professionals (the Practitioner), to provide me with telemedicine health care services (the "Services) as deemed necessary by the treating Practitioner. I acknowledge and consent to receive the Services by the Practitioner via telemedicine. I understand that the telemedicine visit will involve communicating with the Practitioner through live audiovisual communication technology and the disclosure of certain medical information by electronic transmission. I acknowledge that I have been given the opportunity to request an in-person assessment or other available alternative prior to the telemedicine visit and am voluntarily participating in the telemedicine visit.  I understand that I have the right to withhold or withdraw my consent to the use of telemedicine in the course of my care at any time, without affecting my right to future care or treatment, and that the Practitioner or I may terminate the telemedicine visit at any time. I understand that I have the right to inspect all information obtained and/or recorded in the course of the telemedicine visit and may receive copies of available information for a reasonable fee.  I understand that some of the potential risks of receiving the Services via telemedicine include:  Delay or interruption in medical evaluation due to technological equipment failure or  disruption; Information transmitted may not be sufficient (e.g. poor resolution of images) to allow for appropriate medical decision making by the Practitioner; and/or  In rare instances, security protocols could fail, causing a breach of personal health information.  Furthermore, I acknowledge that it is my responsibility to provide information about my medical history, conditions and care that is complete and accurate to the best of my ability. I acknowledge that Practitioner's advice, recommendations, and/or decision may be based on factors not within their control, such as incomplete or inaccurate data provided by me or distortions of diagnostic images or specimens that may result from electronic transmissions. I understand that the practice of medicine is not an exact science and that Practitioner makes no warranties or guarantees regarding treatment outcomes. I acknowledge that a copy of this consent can be made available to me via my patient portal [facility] MyChart), or I can request a printed copy by calling the office of [facility].    I understand that my insurance will be billed for this visit.   I have read or had this consent read to me. I understand the contents of this consent, which adequately explains the benefits and risks of the Services being provided via telemedicine.  I have been provided ample opportunity to ask questions regarding this consent and the Services and have had my questions answered to my satisfaction. I give my informed consent for the services to be provided through the use of telemedicine in my medical care

## 2024-03-16 NOTE — Telephone Encounter (Signed)
 Pharmacy please advise on holding coumadin  prior to surgical extraction of one tooth scheduled for TBD. Last labs (CBC 10/27/2023) (BMET 05/26/2023). Thank you.

## 2024-03-17 NOTE — Telephone Encounter (Signed)
 Patient with diagnosis of afib on warfarin for anticoagulation.    Procedure: Dental Extraction - Amount of Teeth to be Pulled:  1 - surgical  Date of procedure: TBD   CHA2DS2-VASc Score = 3   This indicates a 3.2% annual risk of stroke. The patient's score is based upon: CHF History: 0 HTN History: 1 Diabetes History: 0 Stroke History: 0 Vascular Disease History: 0 Age Score: 2 Gender Score: 0      Possible old infarct on head CT  Patient has not had an Afib/aflutter ablation in the last 3 months, DCCV within the last 4 weeks or a watchman implanted in the last 45 days   Patient does not require pre-op antibiotics for dental procedure.  Per office protocol, patient can hold warfarin for 3 days prior to procedure.    Patient will not need bridging with Lovenox  (enoxaparin ) around procedure.  **This guidance is not considered finalized until pre-operative APP has relayed final recommendations.**

## 2024-03-23 ENCOUNTER — Ambulatory Visit: Attending: Cardiology | Admitting: Emergency Medicine

## 2024-03-23 DIAGNOSIS — Z01810 Encounter for preprocedural cardiovascular examination: Secondary | ICD-10-CM | POA: Diagnosis present

## 2024-03-23 NOTE — Progress Notes (Signed)
 Virtual Visit via Telephone Note   Because of [name] co-morbid illnesses, he is at least at moderate risk for complications without adequate follow up.  This format is felt to be most appropriate for this patient at this time.  Due to technical limitations with video connection (technology), today's appointment will be conducted as an audio only telehealth visit, and [name] verbally agreed to proceed in this manner.   All issues noted in this document were discussed and addressed.  No physical exam could be performed with this format.  Evaluation Performed:  Preoperative cardiovascular risk assessment _____________   Date:  03/23/2024   Patient ID:  [name] [name], DOB 1940-11-20, MRN [id number] Patient Location:  Home Provider location:   Office  Primary Care Provider:  Jerrye Lamar CHRISTELLA Mickey., MD Primary Cardiologist:  Danelle Birmingham, MD  Chief Complaint / Patient Profile   83 y.o. y/o male with a h/o paroxysmal atrial fibrillation s/p catheter ablation and sinus node dysfunction s/p PPM insertion, hypertension who is pending dental extraction on date TBD with urgent tooth sedation and surgical dentistry and presents today for telephonic preoperative cardiovascular risk assessment.  History of Present Illness    [name] [name] is a 83 y.o. male who presents via audio/video conferencing for a telehealth visit today.  Pt was last seen in cardiology clinic on 06/22/2023 by Dr. Birmingham.  At that time [name] was doing well.  The patient is now pending procedure as outlined above.   Since his last visit, he denies chest pain, shortness of breath, lower extremity edema, fatigue, palpitations, melena, hematuria, hemoptysis, diaphoresis, weakness, presyncope, syncope, orthopnea, and PND.  Today patient is doing well without acute cardiovascular concerns.  He stays active walking a half a mile a day without exertional symptoms.  No symptoms to suggest active angina.  He  is able to complete greater than 4 METS.  Past Medical History    Past Medical History:  Diagnosis Date   Anemia    since post op- knee replacement    Anxiety    takes xanax  2 times per day, taken mostly for ringing in ears     Arthritis of knee, right    end-stage   Bronchitis    Bursitis    Complication of anesthesia    irritation in throat postop, makes a-fib worse    Depression    DJD (degenerative joint disease)    OA- everywhere   Dysrhythmia    Family history of anesthesia complication    sister with nausia and vomiting   Gout    Heart murmur    HTN (hypertension)    Hyperlipidemia    Migraines    Paroxysmal atrial fibrillation (HCC)    PONV (postoperative nausea and vomiting)    Presence of permanent cardiac pacemaker    Tinnitus    Past Surgical History:  Procedure Laterality Date   APPENDECTOMY     Atrial fibrillation ablation  2009, 12/02/11   PVI by Dr Kelsie x 2   ATRIAL FIBRILLATION ABLATION N/A 12/02/2011   Procedure: ATRIAL FIBRILLATION ABLATION;  Surgeon: Lynwood Kelsie, MD;  Location: [facility] CATH LAB;  Service: Cardiovascular;  Laterality: N/A;   bilteral thumb joint surgrery     CARDIAC CATHETERIZATION  2005    which revealed 40% stenosis of the mid left anterior descending artery., ablation- 2009   CHOLECYSTECTOMY     JOINT REPLACEMENT     L knee- 12/11,R hip- 1996  left rotator cuff repair     PACEMAKER INSERTION  06/06/2005   Medtronic EnRhythm dual-chamber. For sick sinus syndrome   PERMANENT PACEMAKER GENERATOR CHANGE N/A 03/12/2012   Procedure: PERMANENT PACEMAKER GENERATOR CHANGE;  Surgeon: Danelle LELON Birmingham, MD;  Location: [facility] CATH LAB;  Service: Cardiovascular;  Laterality: N/A;   RHINOPLASTY     right rotator cuff surgery  2006   right total hip arthroplasty     TEE WITHOUT CARDIOVERSION  12/01/2011   Procedure: TRANSESOPHAGEAL ECHOCARDIOGRAM (TEE);  Surgeon: Vina LULLA Gull, MD;  Location: [facility] ENDOSCOPY;  Service: Cardiovascular;  Laterality: N/A;   a-fib ablation following day   TONSILLECTOMY     as a child   TOTAL KNEE ARTHROPLASTY Right 11/27/2020   Procedure: TOTAL KNEE ARTHROPLASTY;  Surgeon: Beverley Evalene BIRCH, MD;  Location: [facility] ORS;  Service: Orthopedics;  Laterality: Right;   TOTAL KNEE REVISION  08/08/2011   Procedure: TOTAL KNEE REVISION;  Surgeon: Dempsey JINNY Sensor, MD;  Location: [facility] OR;  Service: Orthopedics;  Laterality: Left;  DEPUY/SIGMA   TRANSESOPHAGEAL ECHOCARDIOGRAM  04/2008   TRANSTHORACIC ECHOCARDIOGRAM  2006,2009   uvulectomy      Allergies  Allergies  Allergen Reactions   Iohexol  Hives    POST MYELOGRAM.  PT ALSO SAID THIS HAPPENED A YEAR AGO WITH A MYELOGRAM AND WITH ESI'S., Onset Date: [id number]    Meperidine Hcl Other (See Comments)    hallucinations   Demerol Hcl [Meperidine] Other (See Comments)    Hallucinations   Prednisone  Other (See Comments)    Redness of skin & jittery    Home Medications    Prior to Admission medications   Medication Sig Start Date End Date Taking? Authorizing Provider  ALPRAZolam  (XANAX ) 0.5 MG tablet Take 1 tablet (0.5 mg total) by mouth 3 (three) times daily as needed for anxiety. 03/28/13   Rochel Aureliano DASEN, PA-C  Carboxymethylcellulose Sodium (THERATEARS OP) Place 1 drop into both eyes at bedtime.    [provider]  FLUoxetine  (PROZAC ) 40 MG capsule Take one tablet each day for depression. (40mg  ) 03/28/13   Mashburn, Aureliano DASEN, PA-C  gabapentin  (NEURONTIN ) 300 MG capsule Take 1 capsule (300 mg total) by mouth 2 (two) times daily for 14 days. For 2 weeks post op for pain. 11/28/20 03/16/24  Gawne, Meghan M, PA-C  losartan  (COZAAR ) 25 MG tablet Take 1 tablet (25 mg total) by mouth daily. 05/11/23   Birmingham Danelle LELON, MD  metoprolol  succinate (TOPROL -XL) 100 MG 24 hr tablet TAKE 1 TABLET BY MOUTH ONCE DAILY WITH  OR  IMMEDIATELY  FOLLOWING  A  MEAL 07/29/23   Birmingham Danelle LELON, MD  predniSONE  (DELTASONE ) 50 MG tablet Pt to take 50 mg of prednisone  on 08/25/23 at 9:30PM, 50 mg of  prednisone  on 08/26/23 at 3:30AM, and 50 mg of prednisone  on 08/26/23 at 9:30AM. Pt is also to take 50 mg of benadryl  on 08/26/23 at 9:30AM. Please call [phone] with any questions. 08/24/23   Derrill Dawn, MD  tamsulosin  (FLOMAX ) 0.4 MG CAPS capsule Take 0.4 mg by mouth in the morning and at bedtime. 08/10/20   [provider]  warfarin (COUMADIN ) 5 MG tablet TAKE 1/2 TO 1 TABLET BY MOUTH ONCE DAILY AS DIRECTED BY ANTICOAGULATION CLINIC 11/23/23   [place] Danelle LELON, MD    Physical Exam    Vital Signs:  [name] does not have vital signs available for review today.  Given telephonic nature of communication, physical exam is limited. AAOx3. NAD.  Normal affect.  Speech and respirations are unlabored.  Accessory Clinical Findings    None  Assessment & Plan    1.  Preoperative Cardiovascular Risk Assessment: According to the Revised Cardiac Risk Index (RCRI), his Perioperative Risk of Major Cardiac Event is (%): 0.4. His Functional Capacity in METs is: 5.62 according to the Duke Activity Status Index (DASI).  Therefore, based on ACC/AHA guidelines, patient would be at acceptable risk for the planned procedure without further cardiovascular testing. I will route this recommendation to the requesting party via Epic fax function.  The patient was advised that if he develops new symptoms prior to surgery to contact our office to arrange for a follow-up visit, and he verbalized understanding.  Patient does not require pre-op antibiotics for dental procedure.   Per office protocol, patient can hold warfarin for 3 days prior to procedure.     Patient will not need bridging with Lovenox  (enoxaparin ) around procedure.  A copy of this note will be routed to requesting surgeon.  Time:   Today, I have spent 6 minutes with the patient with telehealth technology discussing medical history, symptoms, and management plan.     Lum LITTIE Louis, NP  03/23/2024, 10:07 AM

## 2024-03-25 ENCOUNTER — Ambulatory Visit: Attending: Internal Medicine | Admitting: Pharmacist

## 2024-03-25 DIAGNOSIS — I480 Paroxysmal atrial fibrillation: Secondary | ICD-10-CM | POA: Diagnosis present

## 2024-03-25 DIAGNOSIS — Z7901 Long term (current) use of anticoagulants: Secondary | ICD-10-CM | POA: Diagnosis present

## 2024-03-25 LAB — POCT INR: INR: 1.8 — AB (ref 2.0–3.0)

## 2024-03-25 NOTE — Progress Notes (Signed)
 Description   INR 1.8: Take 1 whole tablet today. Begin holding warfarin on Sunday. When you resume Wednesday take 1 whole tablet and then continue taking warfarin 1/2 tablet daily EXCEPT 1 tablet on Sunday, Tuesday, and Thursday.  Recheck INR in 3 weeks.  Eat 2 helpings of greens per week. Coumadin  Clinic [phone]  Please have a Clearance Form faxed over for any upcoming procedures of any. Procedure Fax Number [phone]

## 2024-03-25 NOTE — Patient Instructions (Addendum)
 Description   INR 1.8: Take 1 whole tablet today. Begin holding warfarin on Sunday. When you resume Wednesday take 1 whole tablet and then continue taking warfarin 1/2 tablet daily EXCEPT 1 tablet on Sunday, Tuesday, and Thursday.  Recheck INR in 3 weeks.  Eat 2 helpings of greens per week. Coumadin  Clinic [phone]  Please have a Clearance Form faxed over for any upcoming procedures of any. Procedure Fax Number [phone]

## 2024-04-15 ENCOUNTER — Ambulatory Visit: Attending: Internal Medicine | Admitting: *Deleted

## 2024-04-15 DIAGNOSIS — Z7901 Long term (current) use of anticoagulants: Secondary | ICD-10-CM | POA: Diagnosis present

## 2024-04-15 DIAGNOSIS — I480 Paroxysmal atrial fibrillation: Secondary | ICD-10-CM | POA: Diagnosis present

## 2024-04-15 LAB — POCT INR: INR: 4.1 — AB (ref 2.0–3.0)

## 2024-04-15 NOTE — Patient Instructions (Signed)
 Description   INR-4.1; Do not take any warfarin today then continue taking warfarin 1/2 tablet daily EXCEPT 1 tablet on Sunday, Tuesday, and Thursday.  Recheck INR in 2 weeks. Eat 2 helpings of greens per week. Coumadin  Clinic [phone]  Please have a Clearance Form faxed over for any upcoming procedures of any. Procedure Fax Number [phone]

## 2024-04-15 NOTE — Progress Notes (Signed)
 Description   INR-4.1; Do not take any warfarin today then continue taking warfarin 1/2 tablet daily EXCEPT 1 tablet on Sunday, Tuesday, and Thursday.  Recheck INR in 2 weeks. Eat 2 helpings of greens per week. Coumadin  Clinic [phone]  Please have a Clearance Form faxed over for any upcoming procedures of any. Procedure Fax Number [phone]

## 2024-04-28 ENCOUNTER — Ambulatory Visit: Attending: Internal Medicine

## 2024-04-28 DIAGNOSIS — Z7901 Long term (current) use of anticoagulants: Secondary | ICD-10-CM | POA: Diagnosis present

## 2024-04-28 LAB — POCT INR: INR: 3.7 — AB (ref 2.0–3.0)

## 2024-04-28 NOTE — Patient Instructions (Signed)
 Do not take any warfarin today then continue taking warfarin 1/2 tablet daily EXCEPT 1 tablet on Sunday, Tuesday, and Thursday.  Recheck INR in 3 weeks. Eat 2 helpings of greens per week. Coumadin  Clinic [phone]  Please have a Clearance Form faxed over for any upcoming procedures of any. Procedure Fax Number [phone]

## 2024-04-28 NOTE — Progress Notes (Signed)
 INR 3.7  Please see anticoagulation encounter  Do not take any warfarin today then continue taking warfarin 1/2 tablet daily EXCEPT 1 tablet on Sunday, Tuesday, and Thursday.  Recheck INR in 3 weeks. Eat 2 helpings of greens per week. Coumadin  Clinic [phone]  Please have a Clearance Form faxed over for any upcoming procedures of any. Procedure Fax Number [phone]

## 2024-05-20 ENCOUNTER — Ambulatory Visit: Attending: Internal Medicine

## 2024-05-20 DIAGNOSIS — Z7901 Long term (current) use of anticoagulants: Secondary | ICD-10-CM | POA: Diagnosis present

## 2024-05-20 LAB — POCT INR: INR: 4.1 — AB (ref 2.0–3.0)

## 2024-05-20 NOTE — Progress Notes (Signed)
"   INR 4.1 Please see anticoagulation encounter Decrease to 0.5 tablet daily, except 1 tablet Tuesday Recheck INR in 2 weeks. Eat 2 helpings of greens per week. Coumadin  Clinic [phone]  Please have a Clearance Form faxed over for any upcoming procedures of any. Procedure Fax Number [phone] "

## 2024-05-20 NOTE — Patient Instructions (Signed)
 Decrease to 0.5 tablet daily, except 1 tablet Tuesday Recheck INR in 2 weeks. Eat 2 helpings of greens per week. Coumadin  Clinic [phone]  Please have a Clearance Form faxed over for any upcoming procedures of any. Procedure Fax Number [phone]

## 2024-05-24 ENCOUNTER — Other Ambulatory Visit: Payer: Self-pay | Admitting: Internal Medicine

## 2024-05-24 ENCOUNTER — Ambulatory Visit: Payer: Medicare Other

## 2024-05-24 DIAGNOSIS — I480 Paroxysmal atrial fibrillation: Secondary | ICD-10-CM

## 2024-05-25 LAB — CUP PACEART REMOTE DEVICE CHECK
Battery Impedance: 2360 Ohm
Battery Remaining Longevity: 31 mo
Battery Voltage: 2.75 V
Brady Statistic AP VP Percent: 0 %
Brady Statistic AP VS Percent: 97 %
Brady Statistic AS VP Percent: 0 %
Brady Statistic AS VS Percent: 3 %
Date Time Interrogation Session: 20251230145228
Implantable Lead Connection Status: 753985
Implantable Lead Connection Status: 753985
Implantable Lead Implant Date: 20070112
Implantable Lead Implant Date: 20070112
Implantable Lead Location: 753859
Implantable Lead Location: 753860
Implantable Lead Model: 5076
Implantable Lead Model: 5076
Implantable Pulse Generator Implant Date: 20131018
Lead Channel Impedance Value: 469 Ohm
Lead Channel Impedance Value: 567 Ohm
Lead Channel Pacing Threshold Amplitude: 0.625 V
Lead Channel Pacing Threshold Amplitude: 1 V
Lead Channel Pacing Threshold Pulse Width: 0.4 ms
Lead Channel Pacing Threshold Pulse Width: 0.4 ms
Lead Channel Setting Pacing Amplitude: 2 V
Lead Channel Setting Pacing Amplitude: 2.5 V
Lead Channel Setting Pacing Pulse Width: 0.34 ms
Lead Channel Setting Sensing Sensitivity: 2.8 mV
Zone Setting Status: 755011
Zone Setting Status: 755011

## 2024-05-29 ENCOUNTER — Ambulatory Visit: Payer: Self-pay | Admitting: Cardiology

## 2024-06-01 NOTE — Progress Notes (Signed)
 Remote PPM Transmission

## 2024-06-03 ENCOUNTER — Ambulatory Visit: Attending: Internal Medicine

## 2024-06-03 DIAGNOSIS — Z7901 Long term (current) use of anticoagulants: Secondary | ICD-10-CM | POA: Insufficient documentation

## 2024-06-03 LAB — POCT INR: INR: 3.1 — AB (ref 2.0–3.0)

## 2024-06-03 NOTE — Progress Notes (Signed)
"   INR 3.1 Please see anticoagulation encounter Continue 0.5 tablet daily, except 1 tablet Tuesday Recheck INR in 4 weeks. Eat 3 helpings of greens per week. Coumadin  Clinic [phone]  Please have a Clearance Form faxed over for any upcoming procedures of any. Procedure Fax Number [phone] "

## 2024-06-03 NOTE — Patient Instructions (Signed)
 Continue 0.5 tablet daily, except 1 tablet Tuesday Recheck INR in 4 weeks. Eat 3 helpings of greens per week. Coumadin  Clinic [phone]  Please have a Clearance Form faxed over for any upcoming procedures of any. Procedure Fax Number [phone]

## 2024-07-01 ENCOUNTER — Ambulatory Visit

## 2024-07-01 DIAGNOSIS — Z7901 Long term (current) use of anticoagulants: Secondary | ICD-10-CM

## 2024-07-01 LAB — POCT INR: INR: 2.5 (ref 2.0–3.0)

## 2024-07-01 NOTE — Patient Instructions (Signed)
 Continue 0.5 tablet daily, except 1 tablet Tuesday Recheck INR in 6 weeks. Eat 3 helpings of greens per week. Coumadin  Clinic [phone]  Please have a Clearance Form faxed over for any upcoming procedures of any. Procedure Fax Number [phone]

## 2024-07-01 NOTE — Progress Notes (Signed)
 INR 2.5  Continue 0.5 tablet daily, except 1 tablet Tuesday Recheck INR in 6 weeks. Eat 3 helpings of greens per week. Coumadin  Clinic [phone]  Please have a Clearance Form faxed over for any upcoming procedures of any. Procedure Fax Number [phone]

## 2024-07-05 ENCOUNTER — Ambulatory Visit: Admitting: Family Medicine

## 2024-07-15 ENCOUNTER — Ambulatory Visit: Admitting: Cardiovascular Disease

## 2024-08-12 ENCOUNTER — Ambulatory Visit
